# Patient Record
Sex: Female | Born: 1945 | Race: White | Hispanic: No | Marital: Married | Smoking: Former smoker
Health system: Southern US, Community
[De-identification: ages and names within clinical notes are randomized; demographics above are authoritative.]

## PROBLEM LIST (undated history)

## (undated) DIAGNOSIS — J189 Pneumonia, unspecified organism: Secondary | ICD-10-CM

## (undated) DIAGNOSIS — C801 Malignant (primary) neoplasm, unspecified: Secondary | ICD-10-CM

## (undated) DIAGNOSIS — N189 Chronic kidney disease, unspecified: Secondary | ICD-10-CM

## (undated) DIAGNOSIS — H269 Unspecified cataract: Secondary | ICD-10-CM

## (undated) DIAGNOSIS — I4891 Unspecified atrial fibrillation: Secondary | ICD-10-CM

## (undated) DIAGNOSIS — I10 Essential (primary) hypertension: Secondary | ICD-10-CM

## (undated) DIAGNOSIS — E119 Type 2 diabetes mellitus without complications: Secondary | ICD-10-CM

## (undated) DIAGNOSIS — M1990 Unspecified osteoarthritis, unspecified site: Secondary | ICD-10-CM

## (undated) DIAGNOSIS — E785 Hyperlipidemia, unspecified: Secondary | ICD-10-CM

## (undated) HISTORY — DX: Essential (primary) hypertension: I10

## (undated) HISTORY — DX: Unspecified cataract: H26.9

## (undated) HISTORY — PX: CATARACT EXTRACTION W/ INTRAOCULAR LENS IMPLANT: SHX1309

## (undated) HISTORY — PX: NEPHRECTOMY: SHX65

## (undated) HISTORY — DX: Hyperlipidemia, unspecified: E78.5

## (undated) HISTORY — PX: COLONOSCOPY: SHX174

## (undated) HISTORY — PX: OVARIAN CYST REMOVAL: SHX89

## (undated) HISTORY — DX: Unspecified osteoarthritis, unspecified site: M19.90

---

## 1998-03-25 ENCOUNTER — Other Ambulatory Visit: Admission: RE | Admit: 1998-03-25 | Discharge: 1998-03-25 | Payer: Self-pay | Admitting: Obstetrics and Gynecology

## 1998-08-06 ENCOUNTER — Other Ambulatory Visit: Admission: RE | Admit: 1998-08-06 | Discharge: 1998-08-06 | Payer: Self-pay | Admitting: Obstetrics and Gynecology

## 1999-04-01 ENCOUNTER — Other Ambulatory Visit: Admission: RE | Admit: 1999-04-01 | Discharge: 1999-04-01 | Payer: Self-pay | Admitting: Obstetrics and Gynecology

## 1999-07-01 ENCOUNTER — Encounter: Payer: Self-pay | Admitting: Unknown Physician Specialty

## 1999-07-01 ENCOUNTER — Encounter: Admission: RE | Admit: 1999-07-01 | Discharge: 1999-07-01 | Payer: Self-pay | Admitting: Unknown Physician Specialty

## 1999-09-16 ENCOUNTER — Other Ambulatory Visit: Admission: RE | Admit: 1999-09-16 | Discharge: 1999-09-16 | Payer: Self-pay | Admitting: Obstetrics and Gynecology

## 2000-04-02 ENCOUNTER — Other Ambulatory Visit: Admission: RE | Admit: 2000-04-02 | Discharge: 2000-04-02 | Payer: Self-pay | Admitting: Obstetrics and Gynecology

## 2000-04-23 ENCOUNTER — Encounter (INDEPENDENT_AMBULATORY_CARE_PROVIDER_SITE_OTHER): Payer: Self-pay

## 2000-04-23 ENCOUNTER — Other Ambulatory Visit: Admission: RE | Admit: 2000-04-23 | Discharge: 2000-04-23 | Payer: Self-pay | Admitting: Obstetrics and Gynecology

## 2000-07-01 ENCOUNTER — Encounter: Admission: RE | Admit: 2000-07-01 | Discharge: 2000-07-01 | Payer: Self-pay | Admitting: Obstetrics and Gynecology

## 2000-07-01 ENCOUNTER — Encounter: Payer: Self-pay | Admitting: Obstetrics and Gynecology

## 2001-04-12 ENCOUNTER — Other Ambulatory Visit: Admission: RE | Admit: 2001-04-12 | Discharge: 2001-04-12 | Payer: Self-pay | Admitting: Obstetrics and Gynecology

## 2001-07-19 ENCOUNTER — Encounter: Payer: Self-pay | Admitting: Obstetrics and Gynecology

## 2001-07-19 ENCOUNTER — Encounter: Admission: RE | Admit: 2001-07-19 | Discharge: 2001-07-19 | Payer: Self-pay | Admitting: Obstetrics and Gynecology

## 2002-07-21 ENCOUNTER — Encounter: Admission: RE | Admit: 2002-07-21 | Discharge: 2002-07-21 | Payer: Self-pay | Admitting: Obstetrics and Gynecology

## 2002-07-21 ENCOUNTER — Encounter: Payer: Self-pay | Admitting: Obstetrics and Gynecology

## 2003-07-04 ENCOUNTER — Encounter: Admission: RE | Admit: 2003-07-04 | Discharge: 2003-07-04 | Payer: Self-pay | Admitting: Internal Medicine

## 2003-07-25 ENCOUNTER — Encounter: Admission: RE | Admit: 2003-07-25 | Discharge: 2003-07-25 | Payer: Self-pay | Admitting: Obstetrics and Gynecology

## 2004-06-12 ENCOUNTER — Ambulatory Visit: Payer: Self-pay | Admitting: Internal Medicine

## 2004-06-17 ENCOUNTER — Ambulatory Visit: Payer: Self-pay | Admitting: Internal Medicine

## 2004-08-01 ENCOUNTER — Ambulatory Visit: Payer: Self-pay | Admitting: Internal Medicine

## 2004-08-12 ENCOUNTER — Encounter: Admission: RE | Admit: 2004-08-12 | Discharge: 2004-08-12 | Payer: Self-pay | Admitting: Obstetrics and Gynecology

## 2004-09-26 ENCOUNTER — Ambulatory Visit: Payer: Self-pay | Admitting: Internal Medicine

## 2004-12-09 ENCOUNTER — Ambulatory Visit: Payer: Self-pay | Admitting: Internal Medicine

## 2005-05-15 ENCOUNTER — Other Ambulatory Visit: Admission: RE | Admit: 2005-05-15 | Discharge: 2005-05-15 | Payer: Self-pay | Admitting: Obstetrics and Gynecology

## 2005-08-19 ENCOUNTER — Encounter: Admission: RE | Admit: 2005-08-19 | Discharge: 2005-08-19 | Payer: Self-pay | Admitting: Obstetrics and Gynecology

## 2006-02-10 ENCOUNTER — Ambulatory Visit: Payer: Self-pay | Admitting: Internal Medicine

## 2006-03-01 ENCOUNTER — Ambulatory Visit: Payer: Self-pay | Admitting: Internal Medicine

## 2006-03-18 ENCOUNTER — Ambulatory Visit: Payer: Self-pay | Admitting: Internal Medicine

## 2006-05-19 ENCOUNTER — Ambulatory Visit: Payer: Self-pay | Admitting: Internal Medicine

## 2006-05-19 LAB — CONVERTED CEMR LAB
ALT: 28 units/L (ref 0–40)
AST: 28 units/L (ref 0–37)
Cholesterol: 150 mg/dL (ref 0–200)
HDL: 49.3 mg/dL (ref >39.0)
LDL Cholesterol: 83 mg/dL (ref 0–99)
Triglycerides: 90 mg/dL (ref 0–149)
VLDL: 18 mg/dL (ref 0–40)

## 2006-08-23 ENCOUNTER — Encounter: Admission: RE | Admit: 2006-08-23 | Discharge: 2006-08-23 | Payer: Self-pay | Admitting: Obstetrics and Gynecology

## 2006-08-25 ENCOUNTER — Encounter: Admission: RE | Admit: 2006-08-25 | Discharge: 2006-08-25 | Payer: Self-pay | Admitting: Obstetrics and Gynecology

## 2006-11-09 ENCOUNTER — Ambulatory Visit: Payer: Self-pay | Admitting: Internal Medicine

## 2006-11-09 LAB — CONVERTED CEMR LAB: Cholesterol, target level: 200 mg/dL

## 2006-11-11 ENCOUNTER — Encounter (INDEPENDENT_AMBULATORY_CARE_PROVIDER_SITE_OTHER): Payer: Self-pay | Admitting: *Deleted

## 2006-11-23 ENCOUNTER — Encounter: Payer: Self-pay | Admitting: Internal Medicine

## 2006-12-13 ENCOUNTER — Encounter: Payer: Self-pay | Admitting: Internal Medicine

## 2007-03-02 ENCOUNTER — Ambulatory Visit: Payer: Self-pay | Admitting: Internal Medicine

## 2007-06-14 ENCOUNTER — Telehealth (INDEPENDENT_AMBULATORY_CARE_PROVIDER_SITE_OTHER): Payer: Self-pay | Admitting: *Deleted

## 2007-07-13 ENCOUNTER — Ambulatory Visit: Payer: Self-pay | Admitting: Internal Medicine

## 2007-07-13 LAB — CONVERTED CEMR LAB: ALT: 21 units/L (ref 0–35)

## 2007-07-21 ENCOUNTER — Ambulatory Visit: Payer: Self-pay | Admitting: Internal Medicine

## 2007-07-24 LAB — CONVERTED CEMR LAB
Cholesterol: 169 mg/dL (ref 0–200)
Hgb A1c MFr Bld: 6.4 % — ABNORMAL HIGH (ref 4.6–6.0)

## 2007-07-25 ENCOUNTER — Encounter (INDEPENDENT_AMBULATORY_CARE_PROVIDER_SITE_OTHER): Payer: Self-pay | Admitting: *Deleted

## 2007-07-27 ENCOUNTER — Telehealth (INDEPENDENT_AMBULATORY_CARE_PROVIDER_SITE_OTHER): Payer: Self-pay | Admitting: *Deleted

## 2007-08-02 ENCOUNTER — Ambulatory Visit: Payer: Self-pay | Admitting: Internal Medicine

## 2007-08-02 DIAGNOSIS — E785 Hyperlipidemia, unspecified: Secondary | ICD-10-CM | POA: Insufficient documentation

## 2007-08-02 DIAGNOSIS — D126 Benign neoplasm of colon, unspecified: Secondary | ICD-10-CM | POA: Insufficient documentation

## 2007-08-02 DIAGNOSIS — M8580 Other specified disorders of bone density and structure, unspecified site: Secondary | ICD-10-CM | POA: Insufficient documentation

## 2007-08-02 DIAGNOSIS — I10 Essential (primary) hypertension: Secondary | ICD-10-CM | POA: Insufficient documentation

## 2007-08-04 ENCOUNTER — Encounter (INDEPENDENT_AMBULATORY_CARE_PROVIDER_SITE_OTHER): Payer: Self-pay | Admitting: *Deleted

## 2007-08-25 ENCOUNTER — Encounter: Admission: RE | Admit: 2007-08-25 | Discharge: 2007-08-25 | Payer: Self-pay | Admitting: Obstetrics and Gynecology

## 2007-08-29 ENCOUNTER — Ambulatory Visit: Payer: Self-pay | Admitting: Gastroenterology

## 2007-11-01 ENCOUNTER — Ambulatory Visit: Payer: Self-pay | Admitting: Internal Medicine

## 2007-11-07 ENCOUNTER — Encounter (INDEPENDENT_AMBULATORY_CARE_PROVIDER_SITE_OTHER): Payer: Self-pay | Admitting: *Deleted

## 2007-11-07 LAB — CONVERTED CEMR LAB
BUN: 15 mg/dL (ref 6–23)
Creatinine,U: 34.2 mg/dL
Hgb A1c MFr Bld: 6 % (ref 4.6–6.0)
Microalb, Ur: 0.2 mg/dL (ref 0.0–1.9)
Potassium: 4.3 meq/L (ref 3.5–5.1)

## 2007-11-08 ENCOUNTER — Ambulatory Visit: Payer: Self-pay | Admitting: Internal Medicine

## 2008-02-03 ENCOUNTER — Ambulatory Visit: Payer: Self-pay | Admitting: Internal Medicine

## 2008-02-10 ENCOUNTER — Encounter (INDEPENDENT_AMBULATORY_CARE_PROVIDER_SITE_OTHER): Payer: Self-pay | Admitting: *Deleted

## 2008-02-10 LAB — CONVERTED CEMR LAB
Albumin: 4 g/dL (ref 3.5–5.2)
Alkaline Phosphatase: 53 units/L (ref 39–117)
Basophils Absolute: 0.1 10*3/uL (ref 0.0–0.1)
Bilirubin, Direct: 0.1 mg/dL (ref 0.0–0.3)
HCT: 37.9 % (ref 36.0–46.0)
Hemoglobin: 13 g/dL (ref 12.0–15.0)
Lymphocytes Relative: 22.6 % (ref 12.0–46.0)
MCV: 84.9 fL (ref 78.0–100.0)
Monocytes Absolute: 0.5 10*3/uL (ref 0.1–1.0)
Neutro Abs: 4.6 10*3/uL (ref 1.4–7.7)
Platelets: 247 10*3/uL (ref 150–400)
RDW: 15.1 % — ABNORMAL HIGH (ref 11.5–14.6)
TSH: 2.39 microintl units/mL (ref 0.35–5.50)
Total Bilirubin: 0.8 mg/dL (ref 0.3–1.2)
Total Protein: 7.7 g/dL (ref 6.0–8.3)
WBC: 6.9 10*3/uL (ref 4.5–10.5)

## 2008-04-30 ENCOUNTER — Ambulatory Visit: Payer: Self-pay | Admitting: Internal Medicine

## 2008-05-02 ENCOUNTER — Encounter (INDEPENDENT_AMBULATORY_CARE_PROVIDER_SITE_OTHER): Payer: Self-pay | Admitting: *Deleted

## 2008-05-02 LAB — CONVERTED CEMR LAB: Hgb A1c MFr Bld: 5.7 % (ref 4.6–6.0)

## 2008-05-14 ENCOUNTER — Telehealth (INDEPENDENT_AMBULATORY_CARE_PROVIDER_SITE_OTHER): Payer: Self-pay | Admitting: *Deleted

## 2008-08-27 ENCOUNTER — Encounter: Admission: RE | Admit: 2008-08-27 | Discharge: 2008-08-27 | Payer: Self-pay | Admitting: Obstetrics and Gynecology

## 2008-10-08 ENCOUNTER — Telehealth (INDEPENDENT_AMBULATORY_CARE_PROVIDER_SITE_OTHER): Payer: Self-pay | Admitting: *Deleted

## 2008-10-23 ENCOUNTER — Telehealth (INDEPENDENT_AMBULATORY_CARE_PROVIDER_SITE_OTHER): Payer: Self-pay | Admitting: *Deleted

## 2008-10-31 ENCOUNTER — Ambulatory Visit: Payer: Self-pay | Admitting: Internal Medicine

## 2008-10-31 LAB — CONVERTED CEMR LAB: Hgb A1c MFr Bld: 5.9 % (ref 4.6–6.5)

## 2008-11-01 ENCOUNTER — Encounter (INDEPENDENT_AMBULATORY_CARE_PROVIDER_SITE_OTHER): Payer: Self-pay | Admitting: *Deleted

## 2008-11-07 ENCOUNTER — Telehealth (INDEPENDENT_AMBULATORY_CARE_PROVIDER_SITE_OTHER): Payer: Self-pay | Admitting: *Deleted

## 2008-11-26 ENCOUNTER — Telehealth (INDEPENDENT_AMBULATORY_CARE_PROVIDER_SITE_OTHER): Payer: Self-pay | Admitting: *Deleted

## 2009-01-03 ENCOUNTER — Telehealth (INDEPENDENT_AMBULATORY_CARE_PROVIDER_SITE_OTHER): Payer: Self-pay | Admitting: *Deleted

## 2009-02-05 ENCOUNTER — Ambulatory Visit: Payer: Self-pay | Admitting: Internal Medicine

## 2009-02-05 LAB — CONVERTED CEMR LAB
ALT: 17 units/L (ref 0–35)
Albumin: 3.8 g/dL (ref 3.5–5.2)
Basophils Relative: 0.1 % (ref 0.0–3.0)
Cholesterol: 146 mg/dL (ref 0–200)
HCT: 38 % (ref 36.0–46.0)
HDL: 60.7 mg/dL (ref >39.00)
Hemoglobin: 13.1 g/dL (ref 12.0–15.0)
Hgb A1c MFr Bld: 5.4 % (ref 4.6–6.5)
LDL Cholesterol: 77 mg/dL (ref 0–99)
Lymphs Abs: 1.1 10*3/uL (ref 0.7–4.0)
MCV: 88.4 fL (ref 78.0–100.0)
Monocytes Absolute: 0.5 10*3/uL (ref 0.1–1.0)
Monocytes Relative: 7.8 % (ref 3.0–12.0)
Platelets: 205 10*3/uL (ref 150.0–400.0)
Total Bilirubin: 0.7 mg/dL (ref 0.3–1.2)
Total CHOL/HDL Ratio: 2
VLDL: 8 mg/dL (ref 0.0–40.0)

## 2009-02-08 ENCOUNTER — Ambulatory Visit: Payer: Self-pay | Admitting: Internal Medicine

## 2009-02-08 DIAGNOSIS — R739 Hyperglycemia, unspecified: Secondary | ICD-10-CM | POA: Insufficient documentation

## 2009-02-13 ENCOUNTER — Ambulatory Visit: Payer: Self-pay | Admitting: Internal Medicine

## 2009-09-04 ENCOUNTER — Encounter: Admission: RE | Admit: 2009-09-04 | Discharge: 2009-09-04 | Payer: Self-pay | Admitting: Obstetrics and Gynecology

## 2010-02-25 ENCOUNTER — Ambulatory Visit: Payer: Self-pay | Admitting: Internal Medicine

## 2010-02-25 ENCOUNTER — Telehealth (INDEPENDENT_AMBULATORY_CARE_PROVIDER_SITE_OTHER): Payer: Self-pay | Admitting: *Deleted

## 2010-03-03 ENCOUNTER — Encounter: Payer: Self-pay | Admitting: Internal Medicine

## 2010-03-03 ENCOUNTER — Ambulatory Visit: Payer: Self-pay | Admitting: Internal Medicine

## 2010-03-03 DIAGNOSIS — L259 Unspecified contact dermatitis, unspecified cause: Secondary | ICD-10-CM | POA: Insufficient documentation

## 2010-03-03 DIAGNOSIS — IMO0001 Reserved for inherently not codable concepts without codable children: Secondary | ICD-10-CM | POA: Insufficient documentation

## 2010-03-06 ENCOUNTER — Ambulatory Visit: Payer: Self-pay | Admitting: Internal Medicine

## 2010-03-10 LAB — CONVERTED CEMR LAB
ALT: 24 units/L (ref 0–35)
AST: 28 units/L (ref 0–37)
Alkaline Phosphatase: 58 units/L (ref 39–117)
BUN: 17 mg/dL (ref 6–23)
Basophils Absolute: 0.1 10*3/uL (ref 0.0–0.1)
Basophils Relative: 0.7 % (ref 0.0–3.0)
Calcium: 9.5 mg/dL (ref 8.4–10.5)
Creatinine, Ser: 1 mg/dL (ref 0.4–1.2)
HCT: 40.3 % (ref 36.0–46.0)
HDL: 64.6 mg/dL (ref >39.00)
Lymphs Abs: 1 10*3/uL (ref 0.7–4.0)
MCHC: 34.8 g/dL (ref 30.0–36.0)
MCV: 92.6 fL (ref 78.0–100.0)
Monocytes Relative: 6.9 % (ref 3.0–12.0)
Neutro Abs: 5.4 10*3/uL (ref 1.4–7.7)
Platelets: 217 10*3/uL (ref 150.0–400.0)
RBC: 4.36 M/uL (ref 3.87–5.11)
Sodium: 143 meq/L (ref 135–145)
Total Bilirubin: 0.5 mg/dL (ref 0.3–1.2)
Total CHOL/HDL Ratio: 3
Total CK: 132 units/L (ref 7–177)
Vit D, 25-Hydroxy: 60 ng/mL (ref 30–89)

## 2010-06-03 NOTE — Assessment & Plan Note (Signed)
Summary: FLU SHOT//PH  Nurse Visit  CC: Flu shot./kb   Allergies: 1)  ! Ibuprofen  Orders Added: 1)  Flu Vaccine 25yrs + MEDICARE PATIENTS [Q2039] 2)  Administration Flu vaccine - MCR [G0008]          Flu Vaccine Consent Questions     Do you have a history of severe allergic reactions to this vaccine? no    Any prior history of allergic reactions to egg and/or gelatin? no    Do you have a sensitivity to the preservative Thimersol? no    Do you have a past history of Guillan-Barre Syndrome? no    Do you currently have an acute febrile illness? no    Have you ever had a severe reaction to latex? no    Vaccine information given and explained to patient? yes    Are you currently pregnant? no    Lot Number:[id number]   Exp Date:11/01/2010   Site Given  Left Deltoid IMu

## 2010-06-03 NOTE — Assessment & Plan Note (Signed)
Summary: APPT FOR ED REFILL   Vital Signs:  Patient profile:   65 year old female Height:      65.25 inches Weight:      166.8 pounds BMI:     27.64 Temp:     97.9 degrees F oral Pulse rate:   60 / minute Resp:     14 per minute BP sitting:   120 / 78  (left arm) Cuff size:   large  Vitals Entered By: Shonna Chock CMA (March 03, 2010 2:48 PM) CC: Yearly follow-up, Lipid Management   CC:  Yearly follow-up and Lipid Management.  History of Present Illness: Here for Medicare AWV: 1.Risk factors based on Past M, S, F history: HTN; Dyslipidemia; Osteopenia ( chart updated) 2.Physical Activities: see Entry data 3.Depression/mood: denied 4.Hearing: decreased whisper @ 6 ft ; Audiology referral recommended 5.ADL's: no limitations 6.Fall Risk: none 7.Home Safety: no issues 8.Height, weight, &visual acuity:wall chart read @ 6 ft w/o lenses 9.Counseling: none requested 10.Labs ordered based on risk factors: see Orders 11. Referral Coordination: Audiology referral recommended 12.Care Plan: see Instructions 13. Cognitive Assessment: Oriented X 3; memory & recall intact   ; "WORLD" spelled backward; mood & affect normal. Hypertension Follow-Up      This is a 65 year old woman who presents for Hypertension follow-up.  The patient denies lightheadedness, urinary frequency, headaches, and fatigue.  The patient denies the following associated symptoms: chest pain, chest pressure, exercise intolerance, dyspnea, palpitations, syncope, leg edema, and pedal edema.  Compliance with medications (by patient report) has been near 100%.  The patient reports that dietary compliance has been good.  The patient reports exercising 3 X per week.  Adjunctive measures currently used by the patient include modified  salt restriction.  BP @ home in 120-130ss/70s. Hyperlipidemia Follow-Up      The patient also presents for Hyperlipidemia follow-up.  The patient reports muscle aches, but denies GI upset,  abdominal pain, flushing, itching, constipation, and diarrhea.  Compliance with medications (by patient report) has been near 100%.  Adjunctive measures currently used by the patient include ASA and folic acid( in a MVI).    Lipid Management History:      Positive NCEP/ATP III risk factors include female age 73 years old or older and hypertension.  Negative NCEP/ATP III risk factors include no history of early menopause without estrogen hormone replacement, non-diabetic, HDL cholesterol greater than 60, no family history for ischemic heart disease, non-tobacco-user status, no ASHD (atherosclerotic heart disease), no prior stroke/TIA, no peripheral vascular disease, and no history of aortic aneurysm.     Preventive Screening-Counseling & Management  Alcohol-Tobacco     Alcohol drinks/day: <1     Smoking Status: quit > 6 months     Year Started: 1962     Year Quit: 1980  Caffeine-Diet-Exercise     Does Patient Exercise: yes     Type of exercise: walk, yard work     Exercise (avg: min/session): 30-60     Times/week: <3  Hep-HIV-STD-Contraception     Sun Exposure-Excessive: no  Safety-Violence-Falls     Violence in the Home: no risk noted     Fall Risk: none      Sexual History:  currently monogamous.        Drug Use:  never.        Blood Transfusions:  no.        Travel History:  none recently.    Current Medications (verified): 1)  Simvastatin 40  Mg  Tabs (Simvastatin) .Marland Kitchen.. 1 By Mouth Once Daily **appointment Due** 2)  Hydrochlorothiazide 25 Mg  Tabs (Hydrochlorothiazide) .... 1/2 Tab Qd 3)  Lopressor 100 Mg  Tabs (Metoprolol Tartrate) .... Take One-Half Tablet Twice Daily**office Visit Due Now** 4)  Calcium and Vit D 2 Tabs Daily 5)  Multivitamin 6)  Asa 81mg  7)  Alendronate Sodium 70 Mg  Tabs (Alendronate Sodium) .Marland Kitchen.. 1 By Mouth Qweek**bone Density Due Now** 8)  Aspirin 325 Mg Tabs (Aspirin) .Marland Kitchen.. 1 By Mouth As Needed 9)  Tylenol Arthritis Pain 650 Mg Cr-Tabs  (Acetaminophen) .... As Needed  Allergies: 1)  ! Ibuprofen  Past History:  Past Medical History: HYPERLIPIDEMIA (ICD-272.4): [place] Study LDL goal = < 160. HYPERTENSION, ESSENTIAL NOS (ICD-401.9) post menopausal state colon polyp, HYPERPLASTIC  05/20/2001 Eczema elevated glucose(fasting hyperglycemia), PMH of  Osteopenia: T score -1.6 @ femoral neck  2010 Blood Donor  Past Surgical History: benign lesion R kidney, S/P nephrectomy (cyst) 1990; ovarian cyst 1990 colonoscopy polyps 05/2001; due 2013 as per Dr  Claudette Head gravid 0 para 0, Dr Henderson Cloud, Gyn  Family History: patient adopted unknown history(no access to family health  records)  Social History: Former Smoker: quit 1988 Alcohol use-yes socially Retired Regular exercise-yes: walk 3 X/week , yard work Married Smoking Status:  quit > 6 months Fall Risk:  none Sun Exposure-Excessive:  no Sexual History:  currently monogamous Drug Use:  never Blood Transfusions:  no  Review of Systems  The patient denies anorexia, hoarseness, prolonged cough, hemoptysis, melena, hematochezia, severe indigestion/heartburn, hematuria, incontinence, suspicious skin lesions, unusual weight change, abnormal bleeding, enlarged lymph nodes, and angioedema.         Weight up 5 # over oast year.  Physical Exam  General:  well-nourished;alert,appropriate and cooperative throughout examination Head:  Normocephalic and atraumatic without obvious abnormalities. Eyes:  No corneal or conjunctival inflammation noted.  Perrla. Light relexes ; cataract bilaterally  Ears:  External ear exam shows no significant lesions or deformities.  Otoscopic examination reveals clear canals, tympanic membranes are intact bilaterally without bulging, retraction, inflammation or discharge. Hearing is grossly  decreased  bilaterally. Nose:  External nasal examination shows no deformity or inflammation. Nasal mucosa are pink and moist without lesions or  exudates. Mouth:  Oral mucosa and oropharynx without lesions or exudates.  Teeth in good repair. Lungs:  Normal respiratory effort, chest expands symmetrically. Lungs are clear to auscultation, no crackles or wheezes. Heart:  regular rhythm, no gallop, no rub, no JVD, no HJR, bradycardia, and grade 1 /6 systolic murmur.   Abdomen:  Bowel sounds positive,abdomen soft and non-tender without masses, organomegaly or hernias noted. Genitalia:  Dr Henderson Cloud Msk:  No deformity or scoliosis noted of thoracic or lumbar spine.   Pulses:  R and L carotid,radial,dorsalis pedis and posterior tibial pulses are full and equal bilaterally Extremities:  No clubbing, cyanosis, edema, or deformity noted with normal full range of motion of all joints.   Neurologic:  alert & oriented X3 and DTRs symmetrical and normal.   Skin:  Intact without suspicious lesions ; eczema R wrist Cervical Nodes:  No lymphadenopathy noted Axillary Nodes:  No palpable lymphadenopathy Psych:  memory intact for recent and remote, normally interactive, and good eye contact.     Impression & Recommendations:  Problem # 1:  PREVENTIVE HEALTH CARE (ICD-V70.0)  Orders: Welcome to Medicare, Physical [phone])  Problem # 2:  HYPERTENSION, ESSENTIAL NOS (ICD-401.9)  Her updated medication list for this problem includes:  Hydrochlorothiazide 25 Mg Tabs (Hydrochlorothiazide) .Marland Kitchen... 1/2 tab qd    Lopressor 100 Mg Tabs (Metoprolol tartrate) .Marland Kitchen... Take one-half tablet twice daily**office visit due now**  Orders: EKG w/ Interpretation (93000)  Problem # 3:  HYPERLIPIDEMIA (ICD-272.4)  Her updated medication list for this problem includes:    Simvastatin 40 Mg Tabs (Simvastatin) .Marland Kitchen... 1 by mouth once daily **appointment due**  Problem # 4:  OSTEOPENIA (ICD-733.90)  Her updated medication list for this problem includes:    Alendronate Sodium 70 Mg Tabs (Alendronate sodium) .Marland Kitchen... 1 by mouth qweek**bone density due now**  Problem # 5:   MUSCLE PAIN (ICD-729.1) on Statin Her updated medication list for this problem includes:    Aspirin 325 Mg Tabs (Aspirin) .Marland Kitchen... 1 by mouth as needed    Tylenol Arthritis Pain 650 Mg Cr-tabs (Acetaminophen) .Marland Kitchen... As needed  Problem # 6:  ECZEMA (ICD-692.9) unresponsive to topical steroids by history; to see Dr Terri Piedra  Complete Medication List: 1)  Simvastatin 40 Mg Tabs (Simvastatin) .Marland Kitchen.. 1 by mouth once daily **appointment due** 2)  Hydrochlorothiazide 25 Mg Tabs (Hydrochlorothiazide) .... 1/2 tab qd 3)  Lopressor 100 Mg Tabs (Metoprolol tartrate) .... Take one-half tablet twice daily**office visit due now** 4)  Calcium and Vit D 2 Tabs Daily  5)  Multivitamin  6)  Asa 81mg   7)  Alendronate Sodium 70 Mg Tabs (Alendronate sodium) .Marland Kitchen.. 1 by mouth qweek**bone density due now** 8)  Aspirin 325 Mg Tabs (Aspirin) .Marland Kitchen.. 1 by mouth as needed 9)  Tylenol Arthritis Pain 650 Mg Cr-tabs (Acetaminophen) .... As needed  Other Orders: Tdap => 32yrs IM (78295) Admin 1st Vaccine (62130)  Lipid Assessment/Plan:      Based on NCEP/ATP III, the patient's risk factor category is "0-1 risk factors".  The patient's lipid goals are as follows: Total cholesterol goal is 200; LDL cholesterol goal is 130; HDL cholesterol goal is 40; Triglyceride goal is 150.  Her LDL cholesterol goal has been met.    Patient Instructions: 1)  Please schedule fasting labs; see Diagnoses for Codes: Vitamin D level; CPK;BMP ;Hepatic Panel ;Lipid Panel ;TSH ;CBC w/ Diff . Consider POA & Living Will .Please see Dr Terri Piedra for Eczema . 2)  Check your Blood Pressure regularly. If it is above: 135/85 ON AVERAGE  you should make an appointment. 3)  It is important that you exercise regularly at least 20 minutes 5 times a week. If you develop chest pain, have severe difficulty breathing, or feel very tired , stop exercising immediately and seek medical attention. 4)  Take an  81 mg coated Aspirin every day.   Orders Added: 1)  Tdap =>  18yrs IM [90715] 2)  Admin 1st Vaccine [90471] 3)  Est. Patient Level III [86578] 4)  Welcome to Medicare, Physical [G0402] 5)  EKG w/ Interpretation [93000]   Immunizations Administered:  Tetanus Vaccine:    Vaccine Type: Tdap    Site: right deltoid    Mfr: GlaxoSmithKline    Dose: 0.5 ml    Route: IM    Given by: Shonna Chock CMA    Exp. Date: 02/21/2012    Lot #: [id number]    VIS given: 03/21/08 version given March 03, 2010.   Immunizations Administered:  Tetanus Vaccine:    Vaccine Type: Tdap    Site: right deltoid    Mfr: GlaxoSmithKline    Dose: 0.5 ml    Route: IM    Given by: Shonna Chock CMA    Exp. Date: 02/21/2012  Lot #: [id number]    VIS given: 03/21/08 version given March 03, 2010.

## 2010-06-03 NOTE — Progress Notes (Signed)
Summary: refill  Phone Note Refill Request Message from:  Fax from Pharmacy on February 25, 2010 10:53 AM  Refills Requested: Medication #1:  SIMVASTATIN 40 MG  TABS 1 by mouth qd  Medication #2:  METOPROLOL TARTRATE 100 M TAKE 1/2 TABLET TWICE A DAY [place] - fax [phone]  Initial call taken by: Okey Regal [place],  February 25, 2010 10:53 AM    New/Updated Medications: SIMVASTATIN 40 MG  TABS (SIMVASTATIN) 1 by mouth once daily **APPOINTMENT DUE** LOPRESSOR 100 MG  TABS (METOPROLOL TARTRATE) Take one-half tablet twice daily**OFFICE VISIT DUE NOW** Prescriptions: LOPRESSOR 100 MG  TABS (METOPROLOL TARTRATE) Take one-half tablet twice daily**OFFICE VISIT DUE NOW**  #30 x 0   Entered by:   Shonna Chock CMA   Authorized by:   Marga Melnick MD   Signed by:   Shonna Chock CMA on 02/25/2010   Method used:   Electronically to        [facility]* (retail)       [address]       [place], [place]  [address]       Ph: [phone]       Fax: [phone]   RxID:   [id number] SIMVASTATIN 40 MG  TABS (SIMVASTATIN) 1 by mouth once daily **APPOINTMENT DUE**  #30 x 0   Entered by:   Shonna Chock CMA   Authorized by:   Marga Melnick MD   Signed by:   Shonna Chock CMA on 02/25/2010   Method used:   Electronically to        [facility]* (retail)       [address]       [place], [place]  [address]       Ph: [phone]       Fax: [phone]   RxID:   [id number]

## 2010-06-05 NOTE — Letter (Signed)
Summary: Patient Questionnaire  Patient Questionnaire   Imported By: Lanelle Bal 05/23/2010 10:06:25  _____________________________________________________________________  External Attachment:    Type:   Image     Comment:   External Document

## 2010-07-08 ENCOUNTER — Other Ambulatory Visit: Payer: Self-pay | Admitting: Obstetrics and Gynecology

## 2010-08-19 ENCOUNTER — Other Ambulatory Visit: Payer: Self-pay | Admitting: Obstetrics and Gynecology

## 2010-08-19 DIAGNOSIS — Z1231 Encounter for screening mammogram for malignant neoplasm of breast: Secondary | ICD-10-CM

## 2010-09-09 ENCOUNTER — Ambulatory Visit
Admission: RE | Admit: 2010-09-09 | Discharge: 2010-09-09 | Disposition: A | Discharge status: Home / Self Care | Payer: Medicare Other | Source: Ambulatory Visit | Attending: Obstetrics and Gynecology | Admitting: Obstetrics and Gynecology

## 2010-09-09 DIAGNOSIS — Z1231 Encounter for screening mammogram for malignant neoplasm of breast: Secondary | ICD-10-CM

## 2011-02-18 ENCOUNTER — Ambulatory Visit (INDEPENDENT_AMBULATORY_CARE_PROVIDER_SITE_OTHER): Payer: Medicare Other

## 2011-02-18 DIAGNOSIS — Z23 Encounter for immunization: Secondary | ICD-10-CM

## 2011-03-23 ENCOUNTER — Other Ambulatory Visit: Payer: Self-pay | Admitting: Internal Medicine

## 2011-03-23 MED ORDER — METOPROLOL TARTRATE 100 MG PO TABS: ORAL_TABLET | ORAL | Status: DC

## 2011-03-23 NOTE — Telephone Encounter (Signed)
RX sent, patient needs to schedule a CPX

## 2011-05-07 ENCOUNTER — Other Ambulatory Visit: Payer: Self-pay | Admitting: Internal Medicine

## 2011-05-07 NOTE — Telephone Encounter (Signed)
Please advise refill? 

## 2011-05-07 NOTE — Telephone Encounter (Signed)
She needs to be scheduled for fasting labs : BMET,Lipids, hepatic panel, CBC & dif, TSH. Codes: 272.4, 401.9, 995.20 . After lab and followup appointment made; refill times one

## 2011-05-08 NOTE — Telephone Encounter (Signed)
Called pt and set up lab appt for 05-11-11 at 9:15am pt is aware she needs to be fasting for this appt and that she will need appt before her rx can be filled, pt understood and set up office visit for 05-13-11 at 10am, pt accepted both appts

## 2011-05-08 NOTE — Telephone Encounter (Signed)
Spoke with [facility] nurse and we agreed note from [facility] agrees to send pt #30 day supply of medication after the office visits/lab visits were set up, appts noted in chart. rx sent to pharmacy by e-script' #30 no refills

## 2011-05-11 ENCOUNTER — Other Ambulatory Visit (INDEPENDENT_AMBULATORY_CARE_PROVIDER_SITE_OTHER): Payer: Medicare Other

## 2011-05-11 DIAGNOSIS — T887XXA Unspecified adverse effect of drug or medicament, initial encounter: Secondary | ICD-10-CM

## 2011-05-11 DIAGNOSIS — I10 Essential (primary) hypertension: Secondary | ICD-10-CM

## 2011-05-11 DIAGNOSIS — E785 Hyperlipidemia, unspecified: Secondary | ICD-10-CM

## 2011-05-11 LAB — CBC WITH DIFFERENTIAL/PLATELET
Basophils Absolute: 0.1 10*3/uL (ref 0.0–0.1)
Eosinophils Absolute: 0.2 10*3/uL (ref 0.0–0.7)
Eosinophils Relative: 2.4 % (ref 0.0–5.0)
Hemoglobin: 14.4 g/dL (ref 12.0–15.0)
Lymphocytes Relative: 19.4 % (ref 12.0–46.0)
Lymphs Abs: 1.4 10*3/uL (ref 0.7–4.0)
MCHC: 34.7 g/dL (ref 30.0–36.0)
Monocytes Absolute: 0.6 10*3/uL (ref 0.1–1.0)
Monocytes Relative: 8 % (ref 3.0–12.0)
Neutro Abs: 4.9 10*3/uL (ref 1.4–7.7)
Neutrophils Relative %: 69.2 % (ref 43.0–77.0)
RDW: 12.5 % (ref 11.5–14.6)
WBC: 7.1 10*3/uL (ref 4.5–10.5)

## 2011-05-11 LAB — BASIC METABOLIC PANEL
CO2: 30 mEq/L (ref 19–32)
Calcium: 9.5 mg/dL (ref 8.4–10.5)
Creatinine, Ser: 1 mg/dL (ref 0.4–1.2)
GFR: 57.48 mL/min — ABNORMAL LOW (ref >60.00)
Glucose, Bld: 118 mg/dL — ABNORMAL HIGH (ref 70–99)
Sodium: 142 mEq/L (ref 135–145)

## 2011-05-11 LAB — TSH: TSH: 1.75 u[IU]/mL (ref 0.35–5.50)

## 2011-05-11 LAB — LIPID PANEL
Cholesterol: 200 mg/dL (ref 0–200)
HDL: 57.7 mg/dL (ref >39.00)
LDL Cholesterol: 125 mg/dL — ABNORMAL HIGH (ref 0–99)
Total CHOL/HDL Ratio: 3
Triglycerides: 86 mg/dL (ref 0.0–149.0)

## 2011-05-12 LAB — HEPATIC FUNCTION PANEL
ALT: 30 U/L (ref 0–35)
AST: 31 U/L (ref 0–37)
Albumin: 4.1 g/dL (ref 3.5–5.2)
Bilirubin, Direct: 0 mg/dL (ref 0.0–0.3)
Total Protein: 7.3 g/dL (ref 6.0–8.3)

## 2011-05-13 ENCOUNTER — Encounter: Payer: Self-pay | Admitting: Internal Medicine

## 2011-05-13 ENCOUNTER — Ambulatory Visit (INDEPENDENT_AMBULATORY_CARE_PROVIDER_SITE_OTHER): Payer: Medicare Other | Admitting: Internal Medicine

## 2011-05-13 DIAGNOSIS — I10 Essential (primary) hypertension: Secondary | ICD-10-CM

## 2011-05-13 DIAGNOSIS — E785 Hyperlipidemia, unspecified: Secondary | ICD-10-CM

## 2011-05-13 DIAGNOSIS — L259 Unspecified contact dermatitis, unspecified cause: Secondary | ICD-10-CM

## 2011-05-13 DIAGNOSIS — R7309 Other abnormal glucose: Secondary | ICD-10-CM

## 2011-05-13 NOTE — Progress Notes (Signed)
  Subjective:    Patient ID: Sharon Knight, female    DOB: 10-13-1945, 66 y.o.   MRN: [id number]  HPI HYPERTENSION: Disease Monitoring: Blood pressure range-120s-130s/ 60s  Chest pain, palpitations- flutter earlier this week       Dyspnea- no Medications: Compliance- yes  Lightheadedness,Syncope- no    Edema- no  FASTING HYPERGLYCEMIA: Fasting blood sugar was 118 on 05/11/11 Polyuria/phagia/dipsia- no      Visual problems- no She has had increased carbohydrate intake since her last visit. Ophthalmology appointment pending in February  is M.D. HYPERLIPIDEMIA: Disease Monitoring: See symptoms for Hypertension Medications: Compliance- yes  Abd pain, bowel changes- no   Muscle aches- no           Review of Systems Exercise level has been decreased due to pain in the right knee. She denies any specific injury but the pain is worse with standing or squatting. She uses aspirin and soft support brace      Objective:   Physical Exam Gen.: Healthy and well-nourished in appearance. Alert, appropriate and cooperative throughout exam. Eyes: No corneal or conjunctival inflammation noted. Neck: No deformities, masses, or tenderness noted. Range of motion &  Thyroid normal. Lungs: Normal respiratory effort; chest expands symmetrically. Lungs are clear to auscultation without rales, wheezes, or increased work of breathing. Heart: Normal rate and rhythm. Normal S1 and S2. No gallop, click, or rub. Grade 1/6 systolic murmur . Abdomen: Bowel sounds normal; abdomen soft and nontender. No masses, organomegaly or hernias noted.                                                                                    Musculoskeletal/extremities:  No clubbing, cyanosis, edema, or deformity noted. Range of motion  normal .Tone & strength  normal. She has fusiform changes of the knees with crepitus, right greater than left. There is no definite effusion noted.  Nail health  good. Vascular: Carotid, radial  artery, dorsalis pedis and  posterior tibial pulses are full and equal. No bruits present. Neurologic: Alert and oriented x3. Deep tendon reflexes symmetrical and normal.          Skin: Eczema  is present at the right ventral wrist  Lymph: No cervical, axillary lymphadenopathy present. Psych: Mood and affect are normal. Normally interactive                                                                                         Assessment & Plan:  She does have a history of osteopenia; the knee issue relates to osteoarthritis/degenerative joint disease. She did decline this prescription pain medicine at this time. I encourage her to see the orthopedist if symptoms persist or progress

## 2011-05-13 NOTE — Assessment & Plan Note (Signed)
She has fasting hyperglycemia due to dietary indiscretion. Nutritional changes will be made with  followup labs in 4 months.

## 2011-05-13 NOTE — Assessment & Plan Note (Signed)
Blood pressure is well controlled based on home measurements. Chemistries are normal with exception of minimally reduced GFR of 57.48

## 2011-05-13 NOTE — Assessment & Plan Note (Signed)
Medication will not be changed; lipids will be rechecked after 4 months of nutritional change.

## 2011-05-13 NOTE — Assessment & Plan Note (Signed)
She believes that her husband has topical steroid at home which he can use as a trial.

## 2011-05-13 NOTE — Patient Instructions (Addendum)
Eat a low-fat diet with lots of fruits and vegetables, up to 7-9 servings per day. Consume less than 30 ( preferably zero) grams of sugar per day from foods & drinks with High Fructose Corn Syrup (HFCS) sugar as #1,2,3 or # 4 on label.Whole Foods, [employer] & [employer] do not carry products with HFCS. Follow a  low carb nutrition program such as [place] or The New Sugar [employer]usters  to prevent Diabetes progression . White carbohydrates (potatoes, rice, bread, and pasta) have a high spike of sugar and a high load of sugar. For example a  baked potato has a cup of sugar and a  french fry  2 teaspoons of sugar. Yams, wild  rice, whole grained bread &  wheat pasta have been much lower spike and load of  sugar. Portions should be the size of a deck of cards or your palm.  Please  schedule fasting Labs in 4 months : A1c, Lipids, vit D level. PLEASE BRING THESE INSTRUCTIONS TO FOLLOW UP  LAB APPOINTMENT.This will guarantee correct labs are drawn, eliminating need for repeat blood sampling ( needle sticks ! ). Diagnoses /Codes: 272.4, 790.29, 268.9.  BUN, creatinine, and GFR  all assess kidney function. To protect the kidneys it  is important to control your blood pressure and sugar. You should also stay well hydrated. Drink to thirst, up to 40 ounces of water a day.

## 2011-06-10 ENCOUNTER — Encounter: Payer: Self-pay | Admitting: Gastroenterology

## 2011-06-17 ENCOUNTER — Other Ambulatory Visit: Payer: Self-pay | Admitting: Internal Medicine

## 2011-07-07 ENCOUNTER — Other Ambulatory Visit: Payer: Self-pay | Admitting: Internal Medicine

## 2011-07-07 NOTE — Telephone Encounter (Signed)
Prescription sent to pharmacy.

## 2011-08-10 ENCOUNTER — Encounter: Payer: Self-pay | Admitting: Gastroenterology

## 2011-08-21 ENCOUNTER — Telehealth: Payer: Self-pay | Admitting: Internal Medicine

## 2011-08-21 DIAGNOSIS — E785 Hyperlipidemia, unspecified: Secondary | ICD-10-CM

## 2011-08-21 DIAGNOSIS — E559 Vitamin D deficiency, unspecified: Secondary | ICD-10-CM

## 2011-08-21 DIAGNOSIS — R7309 Other abnormal glucose: Secondary | ICD-10-CM

## 2011-08-21 NOTE — Telephone Encounter (Signed)
Pts husband called to schedule labs for the pt. According to last OV she needs A1c, Lipids, vit D level in early May. Pt would like to go to [facility]. Can you place the order and I'll call the pt to let her know.

## 2011-08-25 NOTE — Telephone Encounter (Signed)
Orders placed, patient can walk in at [facility], please close encounter once patient informed orders placed

## 2011-08-28 ENCOUNTER — Ambulatory Visit (AMBULATORY_SURGERY_CENTER): Payer: Medicare Other | Admitting: *Deleted

## 2011-08-28 ENCOUNTER — Encounter: Payer: Self-pay | Admitting: Gastroenterology

## 2011-08-28 VITALS — Ht 66.0 in | Wt 179.3 lb

## 2011-08-28 DIAGNOSIS — Z1211 Encounter for screening for malignant neoplasm of colon: Secondary | ICD-10-CM

## 2011-08-28 MED ORDER — PEG-KCL-NACL-NASULF-NA ASC-C 100 G PO SOLR: ORAL | Status: DC

## 2011-09-04 NOTE — Telephone Encounter (Signed)
Noted  

## 2011-09-04 NOTE — Telephone Encounter (Signed)
Pt states she will go have labs done after she has her colonoscopy on 09/11/11. She states she may run out of medication and I instructed her to call her pharmacy.

## 2011-09-11 ENCOUNTER — Ambulatory Visit (AMBULATORY_SURGERY_CENTER): Payer: Medicare Other | Admitting: Gastroenterology

## 2011-09-11 ENCOUNTER — Encounter: Payer: Self-pay | Admitting: Gastroenterology

## 2011-09-11 VITALS — BP 143/86 | HR 59 | Temp 98.7°F | Resp 19 | Ht 66.0 in | Wt 179.0 lb

## 2011-09-11 DIAGNOSIS — Z1211 Encounter for screening for malignant neoplasm of colon: Secondary | ICD-10-CM

## 2011-09-11 DIAGNOSIS — D126 Benign neoplasm of colon, unspecified: Secondary | ICD-10-CM

## 2011-09-11 MED ORDER — SODIUM CHLORIDE 0.9 % IV SOLN: 500.0000 mL | INTRAVENOUS | Status: DC

## 2011-09-11 NOTE — Progress Notes (Signed)
Patient did not experience any of the following events: a burn prior to discharge; a fall within the facility; wrong site/side/patient/procedure/implant event; or a hospital transfer or hospital admission upon discharge from the facility. (G8907) Patient did not have preoperative order for IV antibiotic SSI prophylaxis. (G8918) Patient did not have preoperative order for IV antibiotic SSI prophylaxis. (G8918)  

## 2011-09-11 NOTE — Op Note (Signed)
[facility] [address] [place], [place]  [address]  COLONOSCOPY PROCEDURE REPORT  PATIENT:  Sharon Knight, Sharon Knight  MR#:  [id number] BIRTHDATE:  [birth year], 66 yrs. old  GENDER:  female ENDOSCOPIST:  Judie Petit T. Russella Dar, MD, [facility]  PROCEDURE DATE:  09/11/2011 PROCEDURE:  Colonoscopy with snare polypectomy ASA CLASS:  Class II INDICATIONS:  1) Routine Risk Screening MEDICATIONS:   These medications were titrated to patient response per physician's verbal order, Fentanyl 50 mcg IV, Versed 8 mg IV DESCRIPTION OF PROCEDURE:   After the risks benefits and alternatives of the procedure were thoroughly explained, informed consent was obtained.  Digital rectal exam was performed and revealed no abnormalities.   The LB [id number] endoscope was introduced through the anus and advanced to the cecum, which was identified by both the appendix and ileocecal valve, without limitations.  The quality of the prep was excellent, using MoviPrep.  The instrument was then slowly withdrawn as the colon was fully examined. <<PROCEDUREIMAGES>> FINDINGS:  A sessile polyp was found in the sigmoid colon. It was 5 mm in size. Polyp was snared without cautery. Retrieval was successful.    Mild diverticulosis was found in the sigmoid colon. Otherwise normal colonoscopy without other polyps, masses, vascular ectasias, or inflammatory changes.  Retroflexed views in the rectum revealed no abnormalities.   The time to cecum =  3.5 minutes. The scope was then withdrawn (time =  11.5  min) from the patient and the procedure completed.  COMPLICATIONS:  None  ENDOSCOPIC IMPRESSION: 1) 5 mm sessile polyp in the sigmoid colon 2) Mild diverticulosis in the sigmoid colon  RECOMMENDATIONS: 1) High fiber diet with liberal fluid intake. 2) Await pathology results 3) If the polyp is adenomatous (pre-cancerous), repeat colonoscopy in 5 years. Otherwise follow colorectal cancer screening guidelines for "routine  risk" patients with colonoscopy in 10 years.  Venita Lick. Russella Dar, MD, [facility]  n. eSIGNED:   Venita Lick. Ramiyah Mcclenahan at 09/11/2011 10:56 AM  Dione Plover, [phone]

## 2011-09-11 NOTE — Patient Instructions (Signed)

## 2011-09-11 NOTE — Progress Notes (Signed)
Pressure applied to abdomen to reach cecum.  

## 2011-09-14 ENCOUNTER — Telehealth: Payer: Self-pay | Admitting: *Deleted

## 2011-09-14 NOTE — Telephone Encounter (Signed)
  Follow up Call-  Call back number 09/11/2011  Post procedure Call Back phone  # [phone]  Permission to leave phone message Yes     Patient questions:  Do you have a fever, pain , or abdominal swelling? no Pain Score  0 *  Have you tolerated food without any problems? yes  Have you been able to return to your normal activities? yes  Do you have any questions about your discharge instructions: Diet   no Medications  no Follow up visit  no  Do you have questions or concerns about your Care? no  Actions: * If pain score is 4 or above: No action needed, pain <4.

## 2011-09-15 ENCOUNTER — Encounter: Payer: Self-pay | Admitting: Gastroenterology

## 2011-09-17 ENCOUNTER — Other Ambulatory Visit: Payer: Self-pay | Admitting: Obstetrics and Gynecology

## 2011-09-17 ENCOUNTER — Other Ambulatory Visit (INDEPENDENT_AMBULATORY_CARE_PROVIDER_SITE_OTHER): Payer: Medicare Other

## 2011-09-17 DIAGNOSIS — Z1231 Encounter for screening mammogram for malignant neoplasm of breast: Secondary | ICD-10-CM

## 2011-09-17 DIAGNOSIS — E785 Hyperlipidemia, unspecified: Secondary | ICD-10-CM

## 2011-09-17 DIAGNOSIS — R7309 Other abnormal glucose: Secondary | ICD-10-CM

## 2011-09-17 DIAGNOSIS — E559 Vitamin D deficiency, unspecified: Secondary | ICD-10-CM

## 2011-09-17 LAB — LIPID PANEL
Cholesterol: 165 mg/dL (ref 0–200)
LDL Cholesterol: 91 mg/dL (ref 0–99)
Triglycerides: 69 mg/dL (ref 0.0–149.0)
VLDL: 13.8 mg/dL (ref 0.0–40.0)

## 2011-09-30 ENCOUNTER — Ambulatory Visit
Admission: RE | Admit: 2011-09-30 | Discharge: 2011-09-30 | Disposition: A | Discharge status: Home / Self Care | Payer: Medicare Other | Source: Ambulatory Visit | Attending: Obstetrics and Gynecology | Admitting: Obstetrics and Gynecology

## 2011-09-30 DIAGNOSIS — Z1231 Encounter for screening mammogram for malignant neoplasm of breast: Secondary | ICD-10-CM

## 2012-02-18 ENCOUNTER — Ambulatory Visit (INDEPENDENT_AMBULATORY_CARE_PROVIDER_SITE_OTHER): Payer: Medicare Other

## 2012-02-18 DIAGNOSIS — Z23 Encounter for immunization: Secondary | ICD-10-CM

## 2012-03-08 ENCOUNTER — Other Ambulatory Visit: Payer: Self-pay | Admitting: Internal Medicine

## 2012-03-14 ENCOUNTER — Other Ambulatory Visit: Payer: Self-pay | Admitting: Internal Medicine

## 2012-03-15 NOTE — Telephone Encounter (Signed)
Rx sent.    MW 

## 2012-06-07 ENCOUNTER — Other Ambulatory Visit: Payer: Self-pay | Admitting: Internal Medicine

## 2012-06-07 MED ORDER — HYDROCHLOROTHIAZIDE 25 MG PO TABS: ORAL_TABLET | ORAL | Status: DC

## 2012-06-07 NOTE — Telephone Encounter (Signed)
RX sent Side note: patient needs to schedule a CPX

## 2012-06-07 NOTE — Telephone Encounter (Signed)
refill Hydrochlorothiazide (Tab) 25 MG TAKE (1/2) TABLET DAILY. #45 last fill 11.5.13

## 2012-06-09 ENCOUNTER — Other Ambulatory Visit: Payer: Self-pay | Admitting: Internal Medicine

## 2012-06-19 ENCOUNTER — Other Ambulatory Visit: Payer: Self-pay | Admitting: Internal Medicine

## 2012-09-05 ENCOUNTER — Other Ambulatory Visit: Payer: Self-pay | Admitting: Internal Medicine

## 2012-09-05 NOTE — Telephone Encounter (Signed)
Called pt and scheduled her an appt on 10/31/12 per her request. Filled med until that appt.

## 2012-09-19 ENCOUNTER — Other Ambulatory Visit: Payer: Self-pay | Admitting: Internal Medicine

## 2012-09-20 NOTE — Telephone Encounter (Signed)
Pending appointment 10/31/12

## 2012-10-07 ENCOUNTER — Other Ambulatory Visit: Payer: Self-pay

## 2012-10-07 DIAGNOSIS — Z1231 Encounter for screening mammogram for malignant neoplasm of breast: Secondary | ICD-10-CM

## 2012-10-27 ENCOUNTER — Telehealth: Payer: Self-pay | Admitting: Internal Medicine

## 2012-10-27 NOTE — Telephone Encounter (Signed)
Patient Information:  Caller Name: Sharon Knight  Phone: [phone]  Patient: Sharon Knight, Sharon Knight  Gender: Female  DOB: July 12, 1945  Age: 67 Years  PCP: Marga Melnick  Office Follow Up:  Does the office need to follow up with this patient?: No  Instructions For The Office: N/A   Symptoms  Reason For Call & Symptoms: Asks if she can take her morning medications on 10/31/12 prior to her labs being drawn.  Per S. Rice, RN advised  morning medications may be taken with a small amount of water only.  Reviewed Health History In EMR: N/A  Reviewed Medications In EMR: N/A  Reviewed Allergies In EMR: N/A  Reviewed Surgeries / Procedures: N/A  Date of Onset of Symptoms: Unknown  Guideline(s) Used:  No Protocol Available - Information Only  Disposition Per Guideline:   Home Care  Reason For Disposition Reached:   Information only question and nurse able to answer  Advice Given:  Call Back If:  New symptoms develop  Patient Will Follow Care Advice:  YES

## 2012-10-31 ENCOUNTER — Other Ambulatory Visit: Payer: Self-pay | Admitting: Internal Medicine

## 2012-10-31 ENCOUNTER — Ambulatory Visit (INDEPENDENT_AMBULATORY_CARE_PROVIDER_SITE_OTHER): Payer: Medicare Other | Admitting: Internal Medicine

## 2012-10-31 ENCOUNTER — Encounter: Payer: Self-pay | Admitting: Internal Medicine

## 2012-10-31 VITALS — BP 124/78 | HR 54 | Temp 98.3°F | Ht 65.5 in | Wt 177.0 lb

## 2012-10-31 DIAGNOSIS — E559 Vitamin D deficiency, unspecified: Secondary | ICD-10-CM | POA: Insufficient documentation

## 2012-10-31 DIAGNOSIS — D126 Benign neoplasm of colon, unspecified: Secondary | ICD-10-CM

## 2012-10-31 DIAGNOSIS — E785 Hyperlipidemia, unspecified: Secondary | ICD-10-CM

## 2012-10-31 DIAGNOSIS — M899 Disorder of bone, unspecified: Secondary | ICD-10-CM

## 2012-10-31 DIAGNOSIS — Z0000 Encounter for general adult medical examination without abnormal findings: Secondary | ICD-10-CM

## 2012-10-31 DIAGNOSIS — Z905 Acquired absence of kidney: Secondary | ICD-10-CM

## 2012-10-31 DIAGNOSIS — M949 Disorder of cartilage, unspecified: Secondary | ICD-10-CM

## 2012-10-31 DIAGNOSIS — I10 Essential (primary) hypertension: Secondary | ICD-10-CM

## 2012-10-31 LAB — CBC WITH DIFFERENTIAL/PLATELET
Basophils Absolute: 0.1 10*3/uL (ref 0.0–0.1)
Basophils Relative: 1.2 % (ref 0.0–3.0)
Eosinophils Relative: 1.9 % (ref 0.0–5.0)
HCT: 42.3 % (ref 36.0–46.0)
Hemoglobin: 14.5 g/dL (ref 12.0–15.0)
Lymphs Abs: 1.4 10*3/uL (ref 0.7–4.0)
Monocytes Relative: 8 % (ref 3.0–12.0)
Neutro Abs: 3.8 10*3/uL (ref 1.4–7.7)
RBC: 4.58 Mil/uL (ref 3.87–5.11)
RDW: 12.8 % (ref 11.5–14.6)

## 2012-10-31 LAB — HEPATIC FUNCTION PANEL
ALT: 22 U/L (ref 0–35)
AST: 23 U/L (ref 0–37)
Albumin: 4.3 g/dL (ref 3.5–5.2)
Alkaline Phosphatase: 55 U/L (ref 39–117)
Total Protein: 7.6 g/dL (ref 6.0–8.3)

## 2012-10-31 LAB — TSH: TSH: 2.23 u[IU]/mL (ref 0.35–5.50)

## 2012-10-31 LAB — BASIC METABOLIC PANEL
GFR: 55.34 mL/min — ABNORMAL LOW (ref >60.00)
Glucose, Bld: 109 mg/dL — ABNORMAL HIGH (ref 70–99)
Potassium: 4.3 mEq/L (ref 3.5–5.1)
Sodium: 141 mEq/L (ref 135–145)

## 2012-10-31 MED ORDER — ZOSTER VACCINE LIVE 19400 UNT/0.65ML ~~LOC~~ SOLR: 0.6500 mL | Freq: Once | SUBCUTANEOUS | Status: DC

## 2012-10-31 NOTE — Addendum Note (Signed)
Addended by: Edwena Felty T on: 10/31/2012 10:31 AM   Modules accepted: Orders

## 2012-10-31 NOTE — Progress Notes (Signed)
Subjective:    Patient ID: Sharon Knight, female    DOB: 30-May-1945, 67 y.o.   MRN: [id number]  HPI Medicare Wellness Visit:  Psychosocial & medical history were reviewed as required by Medicare (abuse,antisocial behavioral risks,firearm risk).  Social history: caffeine: 1 coffee & 2 teas/day  , alcohol: 3 beers/ week  ,  tobacco use: quit 1986 Exercise :  See below Home & personal  safety / fall risk:no Limitations of activities of daily living:no Seatbelt  and smoke alarm use:yes Power of Attorney/Living Will status : needed Ophthalmology exam status :current Hearing evaluation status: not current Orientation :oriented X 3 Memory & recall :good Spelling  Testing: good Active depression / anxiety:denied Foreign travel history : > 40 years ago, [place] Immunization status for Shingles /Flu/ PNA/ tetanus :Shingles needed Transfusion history:  no Preventive health surveillance status of colonoscopy, BMD , mammograms,PAP as per protocol/ WUJ:[id number] pending 7/11 Dental care: every 6 mos  Chart reviewed &  Updated. Active issues reviewed & addressed.      Review of Systems She is usually on a heart healthy diet; she exercises as walking every other day > 30 minutes  without symptoms. Specifically she denies chest pain, palpitations, dyspnea, or claudication. Family history  for premature coronary disease is not known. Advanced cholesterol testing not on record. There is medication compliance with the statin. Significant abdominal symptoms, memory deficit, or myalgias denied. BP @ home not monitored. .     Objective:   Physical Exam  Gen.: Healthy and well-nourished in appearance. Alert, appropriate and cooperative throughout exam.Appears younger than stated age  Head: Normocephalic without obvious abnormalities  Eyes: No corneal or conjunctival inflammation noted. Pupils equal round reactive to light and accommodation.  Extraocular motion intact. Vision grossly normal with  lenses Ears: External  ear exam reveals no significant lesions or deformities. Canals clear .R TM scarred Hearing is grossly decreased bilaterally, L >R. Nose: External nasal exam reveals no deformity or inflammation. Nasal mucosa are pink and moist. No lesions or exudates noted.   Mouth: Oral mucosa and oropharynx reveal no lesions or exudates. Teeth in good repair. Neck: No deformities, masses, or tenderness noted. Range of motion & Thyroid normal. Lungs: Normal respiratory effort; chest expands symmetrically. Lungs are clear to auscultation without rales, wheezes, or increased work of breathing. Heart: Slow rate andregular rhythm. Normal S1 and S2. No gallop, click, or rub. Grade 1/2 over 6 systolic murmur LSB. Abdomen: Bowel sounds normal; abdomen soft and nontender. No masses, organomegaly or hernias noted. Genitalia: As per Dr Judith Part                                  Musculoskeletal/extremities: Accentuated curvature of  thoracic  Spine. No clubbing, cyanosis, edema, or significant extremity  deformity noted. Range of motion normal .Tone & strength  Normal. Joints normal . Nail health good. Able to lie down & sit up w/o help. Negative SLR bilaterally. Fusiform knees with some crepitus Vascular: Carotid, radial artery, dorsalis pedis and  posterior tibial pulses are full and equal. No bruits present. Neurologic: Alert and oriented x3. Deep tendon reflexes symmetrical and normal.  Skin: Intact without suspicious lesions or rashes. Lymph: No cervical, axillary lymphadenopathy present. Psych: Mood and affect are normal. Normally interactive  Assessment & Plan:  #1 Medicare Wellness Exam; criteria met ; data entered #2 Problem List/Diagnoses reviewed Plan:  Assessments made/ Orders entered  

## 2012-10-31 NOTE — Patient Instructions (Addendum)
Preventive Health Care: Exercise  30-45  minutes a day, 3-4 days a week. Walking is especially valuable in preventing Osteoporosis. Eat a low-fat diet with lots of fruits and vegetables, up to 7-9 servings per day. This would eliminate need for vitamin supplements for most individuals. Consume less than 30 grams of sugar per day from foods & drinks with High Fructose Corn Syrup as #2,3 or #4 on label. The legal document "Health Care Power of Attorney & Living Will " verifies decisions concerning your health care.  If you activate the  My Chart system; lab & Xray results will be released directly  to you as soon as I review & address these through the computer. In my absence ;my partners would address the results.Critical lab results will be communicated to you ASAP. If you choose not to sign up for My Chart within 36 hours of labs being drawn; results will be reviewed & interpretation added before being copied & mailed, causing a delay in getting the results to you.If you do not receive that report within 7-10 days ,please call. Additionally you can use this system to gain direct  access to your records  if  out of town or @ an office of a  physician who is not in  the My Chart network.  This improves continuity of care & places you in control of your medical record.

## 2012-11-01 LAB — NMR LIPOPROFILE WITH LIPIDS

## 2012-11-02 ENCOUNTER — Encounter: Payer: Self-pay | Admitting: *Deleted

## 2012-11-02 ENCOUNTER — Ambulatory Visit: Payer: Medicare Other

## 2012-11-02 DIAGNOSIS — R7309 Other abnormal glucose: Secondary | ICD-10-CM

## 2012-11-07 ENCOUNTER — Encounter: Payer: Self-pay | Admitting: *Deleted

## 2012-11-11 ENCOUNTER — Ambulatory Visit
Admission: RE | Admit: 2012-11-11 | Discharge: 2012-11-11 | Disposition: A | Discharge status: Home / Self Care | Payer: Medicare Other | Source: Ambulatory Visit

## 2012-11-11 DIAGNOSIS — Z1231 Encounter for screening mammogram for malignant neoplasm of breast: Secondary | ICD-10-CM

## 2012-11-13 ENCOUNTER — Encounter: Payer: Self-pay | Admitting: Internal Medicine

## 2012-11-13 ENCOUNTER — Other Ambulatory Visit: Payer: Self-pay | Admitting: Internal Medicine

## 2012-11-13 DIAGNOSIS — Z905 Acquired absence of kidney: Secondary | ICD-10-CM

## 2012-11-13 DIAGNOSIS — I10 Essential (primary) hypertension: Secondary | ICD-10-CM

## 2012-11-14 ENCOUNTER — Telehealth: Payer: Self-pay

## 2012-11-14 NOTE — Telephone Encounter (Signed)
Pt returned your call.  

## 2012-11-14 NOTE — Telephone Encounter (Signed)
Message copied by Maurice Small on Mon Nov 14, 2012  8:55 AM ------      Message from: Pecola Lawless      Created: Sun Nov 13, 2012 11:51 AM                   I have reviewed the bone density reports she provided and entered these into her electronic medical record. Also I reviewed her detailed letter.            I do not recommend calcium supplementation other than that in her diet. The GFR has decreased minimally. I recommend a renal ultrasound and a 24-hour urine for protein. Based on these results neurology follow up could be considered. ------

## 2012-11-14 NOTE — Telephone Encounter (Signed)
Left message on VM for patient to return call when available, reason for call: discuss Dr.Hopper's response to paperwork that patient provided

## 2012-11-15 ENCOUNTER — Ambulatory Visit
Admission: RE | Admit: 2012-11-15 | Discharge: 2012-11-15 | Disposition: A | Discharge status: Home / Self Care | Payer: Medicare Other | Source: Ambulatory Visit | Attending: Internal Medicine | Admitting: Internal Medicine

## 2012-11-15 ENCOUNTER — Other Ambulatory Visit: Payer: Self-pay | Admitting: Internal Medicine

## 2012-11-15 DIAGNOSIS — Z905 Acquired absence of kidney: Secondary | ICD-10-CM

## 2012-11-15 DIAGNOSIS — I10 Essential (primary) hypertension: Secondary | ICD-10-CM

## 2012-11-15 NOTE — Telephone Encounter (Signed)
Patient is calling back to f/u.

## 2012-11-15 NOTE — Telephone Encounter (Signed)
Discussed with patient, patient will pick-up 24 hour urine from [facility] and drop it off here ( This was ok'd by Clydie Braun at [facility] lab). Patient had Renal U/s done today.  [facility]opp please place order for 24 hour urine for GJ

## 2012-11-17 NOTE — Addendum Note (Signed)
Addended by: Silvio Pate D on: 11/17/2012 04:04 PM   Modules accepted: Orders

## 2012-11-18 LAB — PROTEIN, URINE, 24 HOUR: Protein, Urine: <3 mg/dL

## 2012-11-23 ENCOUNTER — Telehealth: Payer: Self-pay | Admitting: Internal Medicine

## 2012-11-23 NOTE — Telephone Encounter (Signed)
I checked Lipo science web site, no report on patient. I called [facility], no NMR was processed due to no tube received. I called patient, line busy. I will try to reach patient tomorrow

## 2012-11-23 NOTE — Telephone Encounter (Signed)
Please call NMR ; I don't have the report

## 2012-11-23 NOTE — Telephone Encounter (Signed)
[facility]opp please advise if you have seen NMR on this patient

## 2012-11-23 NOTE — Telephone Encounter (Signed)
Patient requests that her most recent cholesterol lab results be mailed to her. States that she received all her other results accept the cholesterol.

## 2012-11-24 NOTE — Telephone Encounter (Signed)
Tried to reach patient again, line busy . I will try to reach patient again later, reason for call if patient calls back first: Patient will have to come back in for cholesterol(NMR) to be checked (NO VENIPUNCTURE CHARGE), it appears this was left off last lab draw.

## 2012-11-25 NOTE — Telephone Encounter (Signed)
Tried to reach patient again today and line remains busy, I will mail letter to patient informing her that NMR (cholesterol profile) was not processed and at her convenience she can come in for a NO VENIPUNCTURE CHARGE fasting lab appointment.

## 2012-12-05 ENCOUNTER — Other Ambulatory Visit: Payer: Medicare Other

## 2012-12-05 DIAGNOSIS — E785 Hyperlipidemia, unspecified: Secondary | ICD-10-CM

## 2012-12-06 ENCOUNTER — Encounter: Payer: Self-pay | Admitting: Internal Medicine

## 2012-12-06 LAB — NMR LIPOPROFILE WITH LIPIDS
HDL Size: 9.5 nm (ref >=9.2)
HDL-C: 60 mg/dL (ref >=40)
LDL (calc): 92 mg/dL (ref <100)
LDL Size: 21.1 nm (ref >20.5)
LP-IR Score: 35 (ref <=45)
Large HDL-P: 8.2 umol/L (ref >=4.8)
Triglycerides: 109 mg/dL (ref <150)

## 2012-12-13 ENCOUNTER — Other Ambulatory Visit: Payer: Self-pay | Admitting: Internal Medicine

## 2012-12-22 ENCOUNTER — Other Ambulatory Visit: Payer: Self-pay | Admitting: Internal Medicine

## 2012-12-22 NOTE — Telephone Encounter (Signed)
Rx sent to the pharmacy by e-script.//AB/CMA 

## 2013-02-14 ENCOUNTER — Ambulatory Visit (INDEPENDENT_AMBULATORY_CARE_PROVIDER_SITE_OTHER): Payer: Medicare Other

## 2013-02-14 DIAGNOSIS — Z23 Encounter for immunization: Secondary | ICD-10-CM

## 2013-03-05 ENCOUNTER — Other Ambulatory Visit: Payer: Self-pay | Admitting: Internal Medicine

## 2013-03-06 NOTE — Telephone Encounter (Signed)
Hydrochlorothiazide refill sent to pharmacy 

## 2013-06-11 ENCOUNTER — Other Ambulatory Visit: Payer: Self-pay | Admitting: Internal Medicine

## 2013-06-13 NOTE — Telephone Encounter (Signed)
Rx sent to the pharmacy by e-script.  Pt needs office visit for BP follow-up.//AB/CMA

## 2013-08-29 ENCOUNTER — Other Ambulatory Visit: Payer: Self-pay | Admitting: Internal Medicine

## 2013-09-12 ENCOUNTER — Other Ambulatory Visit: Payer: Self-pay | Admitting: Internal Medicine

## 2013-10-26 ENCOUNTER — Other Ambulatory Visit: Payer: Self-pay | Admitting: Obstetrics and Gynecology

## 2013-10-27 LAB — CYTOLOGY - PAP

## 2013-11-20 ENCOUNTER — Other Ambulatory Visit: Payer: Self-pay

## 2013-11-20 DIAGNOSIS — Z1231 Encounter for screening mammogram for malignant neoplasm of breast: Secondary | ICD-10-CM

## 2013-11-29 ENCOUNTER — Ambulatory Visit
Admission: RE | Admit: 2013-11-29 | Discharge: 2013-11-29 | Disposition: A | Discharge status: Home / Self Care | Payer: Medicare Other | Source: Ambulatory Visit

## 2013-11-29 ENCOUNTER — Encounter (INDEPENDENT_AMBULATORY_CARE_PROVIDER_SITE_OTHER): Payer: Self-pay

## 2013-11-29 DIAGNOSIS — Z1231 Encounter for screening mammogram for malignant neoplasm of breast: Secondary | ICD-10-CM

## 2013-12-10 ENCOUNTER — Other Ambulatory Visit: Payer: Self-pay | Admitting: Internal Medicine

## 2013-12-21 ENCOUNTER — Ambulatory Visit (INDEPENDENT_AMBULATORY_CARE_PROVIDER_SITE_OTHER): Payer: Medicare Other | Admitting: Internal Medicine

## 2013-12-21 ENCOUNTER — Other Ambulatory Visit (INDEPENDENT_AMBULATORY_CARE_PROVIDER_SITE_OTHER): Payer: Medicare Other

## 2013-12-21 ENCOUNTER — Encounter: Payer: Self-pay | Admitting: Internal Medicine

## 2013-12-21 VITALS — BP 150/90 | HR 52 | Temp 98.4°F | Wt 185.8 lb

## 2013-12-21 DIAGNOSIS — E559 Vitamin D deficiency, unspecified: Secondary | ICD-10-CM

## 2013-12-21 DIAGNOSIS — R7309 Other abnormal glucose: Secondary | ICD-10-CM

## 2013-12-21 DIAGNOSIS — E785 Hyperlipidemia, unspecified: Secondary | ICD-10-CM

## 2013-12-21 DIAGNOSIS — I10 Essential (primary) hypertension: Secondary | ICD-10-CM

## 2013-12-21 LAB — HEPATIC FUNCTION PANEL
ALK PHOS: 59 U/L (ref 39–117)
ALT: 32 U/L (ref 0–35)
AST: 29 U/L (ref 0–37)
Albumin: 4.2 g/dL (ref 3.5–5.2)
BILIRUBIN DIRECT: 0.1 mg/dL (ref 0.0–0.3)
BILIRUBIN TOTAL: 0.7 mg/dL (ref 0.2–1.2)
Total Protein: 7.4 g/dL (ref 6.0–8.3)

## 2013-12-21 LAB — BASIC METABOLIC PANEL
BUN: 17 mg/dL (ref 6–23)
CO2: 30 meq/L (ref 19–32)
CREATININE: 1 mg/dL (ref 0.4–1.2)
Calcium: 9.7 mg/dL (ref 8.4–10.5)
Chloride: 105 mEq/L (ref 96–112)
GFR: 61.91 mL/min (ref >60.00)
Glucose, Bld: 109 mg/dL — ABNORMAL HIGH (ref 70–99)
Potassium: 4.6 mEq/L (ref 3.5–5.1)
SODIUM: 142 meq/L (ref 135–145)

## 2013-12-21 LAB — LIPID PANEL
CHOL/HDL RATIO: 4
Cholesterol: 221 mg/dL — ABNORMAL HIGH (ref 0–200)
HDL: 61.1 mg/dL (ref >39.00)
LDL CALC: 134 mg/dL — AB (ref 0–99)
NONHDL: 159.9
Triglycerides: 129 mg/dL (ref 0.0–149.0)
VLDL: 25.8 mg/dL (ref 0.0–40.0)

## 2013-12-21 LAB — HEMOGLOBIN A1C: Hgb A1c MFr Bld: 6.1 % (ref 4.6–6.5)

## 2013-12-21 LAB — TSH: TSH: 2.43 u[IU]/mL (ref 0.35–4.50)

## 2013-12-21 LAB — VITAMIN D 25 HYDROXY (VIT D DEFICIENCY, FRACTURES): VITD: 57.19 ng/mL (ref 30.00–100.00)

## 2013-12-21 NOTE — Assessment & Plan Note (Signed)
Vitamin D level 

## 2013-12-21 NOTE — Assessment & Plan Note (Signed)
Blood pressure goals reviewed. BMET 

## 2013-12-21 NOTE — Assessment & Plan Note (Addendum)
Lipids, LFTs, TSH   Note : on Simvastatin 40 mg 1/2 qd

## 2013-12-21 NOTE — Assessment & Plan Note (Signed)
A1c

## 2013-12-21 NOTE — Progress Notes (Signed)
   Subjective:    Patient ID: Sharon Knight, female    DOB: 1946/02/09, 68 y.o.   MRN: [id number]  HPI   She is on a modified heart healthy diet; she does not restrict salt  She has not been exercising due to various health issues.  She has no adverse effects from her medications but she decreased her simvastatin 40 mg to half a pill daily 6 weeks ago as she was running low.  She does not monitor blood pressure regular basis but has found it to be 140+/70-80s on average.  She has had occasional palpitations after being active in the summer heat.  She also describes some constipation. Her colonoscopy is up-to-date.  Her health issues this year have been recurrent rash over several months. She found this was related to a detergent. It did improve with topical agents  She's had recurrent left knee pain which limits her exercise.  She's also had low back pain. She is taking Aleve for the musculoskeletal complaints.  She has had recurrent ovarian cysts which are being monitored by ultrasound every 6 months by her gynecologist.    Intermittent BPV symptoms.   Review of Systems  Chest pain, tachycardia, exertional dyspnea, paroxysmal nocturnal dyspnea, claudication or edema are absent.  Unexplained weight loss, abdominal pain, significant dyspepsia, dysphagia, melena, rectal bleeding, or persistently small caliber stools are denied.     Objective:   Physical Exam   Positive or pertinent physical findings include: Slight resting exotropia of the right eye. She has marked hearing loss bilaterally. Grade 1 systolic murmur at the base. There are fusiform changes of the knees with marked crepitus, left greater than right. She has a decreased left knee deep tendon reflex.  General appearance :adequately nourished; in no distress. Eyes: No conjunctival inflammation or scleral icterus is present. Oral exam: Dental hygiene is good. Lips and gums are healthy appearing.There is no  oropharyngeal erythema or exudate noted.  Heart:  Normal rate and regular rhythm. S1 and S2 normal without gallop, murmur, click, rub or other extra sounds   Lungs:Chest clear to auscultation; no wheezes, rhonchi,rales ,or rubs present.No increased work of breathing.  Abdomen: bowel sounds normal, soft and non-tender without masses, organomegaly or hernias noted.  No guarding or rebound. No flank tenderness to percussion. Skin:Warm & dry.  Intact without suspicious lesions or rashes ; no jaundice or tenting Lymphatic: No lymphadenopathy is noted about the head, neck, axilla          Assessment & Plan:  See Current Assessment & Plan in Problem List under specific Diagnosis

## 2013-12-21 NOTE — Progress Notes (Signed)
Pre visit review using our clinic review tool, if applicable. No additional management support is needed unless otherwise documented below in the visit note. 

## 2013-12-21 NOTE — Patient Instructions (Addendum)
Use an anti-inflammatory cream such as Aspercreme or Zostrix cream twice a day to the affected area as needed. In lieu of this warm moist compresses or  hot water bottle can be used. Do not apply ice .  Minimal Blood Pressure Goal= AVERAGE < 140/90;  Ideal is an AVERAGE < 135/85. This AVERAGE should be calculated from @ least 5-7 BP readings taken @ different times of day on different days of week. You should not respond to isolated BP readings , but rather the AVERAGE for that week .Please bring your  blood pressure cuff to office visits to verify that it is reliable.It  can also be checked against the blood pressure device at the pharmacy. Finger or wrist cuffs are not dependable; an arm cuff is.  Go to Web M[employer] for information on benign positional vertigo (BPV) . Physical therapy exercises can treat that.

## 2014-01-09 ENCOUNTER — Other Ambulatory Visit: Payer: Self-pay | Admitting: Internal Medicine

## 2014-02-09 ENCOUNTER — Other Ambulatory Visit: Payer: Self-pay | Admitting: Internal Medicine

## 2014-02-20 ENCOUNTER — Ambulatory Visit (INDEPENDENT_AMBULATORY_CARE_PROVIDER_SITE_OTHER): Payer: Medicare Other

## 2014-02-20 DIAGNOSIS — Z23 Encounter for immunization: Secondary | ICD-10-CM

## 2014-05-28 ENCOUNTER — Other Ambulatory Visit: Payer: Self-pay | Admitting: Internal Medicine

## 2014-08-06 ENCOUNTER — Other Ambulatory Visit: Payer: Self-pay | Admitting: Internal Medicine

## 2014-10-22 ENCOUNTER — Encounter: Payer: Self-pay | Admitting: Internal Medicine

## 2014-10-22 ENCOUNTER — Other Ambulatory Visit (INDEPENDENT_AMBULATORY_CARE_PROVIDER_SITE_OTHER): Payer: Medicare Other

## 2014-10-22 ENCOUNTER — Ambulatory Visit (INDEPENDENT_AMBULATORY_CARE_PROVIDER_SITE_OTHER): Payer: Medicare Other | Admitting: Internal Medicine

## 2014-10-22 VITALS — BP 138/68 | HR 55 | Temp 97.7°F | Resp 14 | Ht 65.0 in | Wt 182.0 lb

## 2014-10-22 DIAGNOSIS — I10 Essential (primary) hypertension: Secondary | ICD-10-CM | POA: Diagnosis not present

## 2014-10-22 DIAGNOSIS — R002 Palpitations: Secondary | ICD-10-CM

## 2014-10-22 DIAGNOSIS — R739 Hyperglycemia, unspecified: Secondary | ICD-10-CM | POA: Diagnosis not present

## 2014-10-22 DIAGNOSIS — Z86010 Personal history of colonic polyps: Secondary | ICD-10-CM | POA: Diagnosis not present

## 2014-10-22 DIAGNOSIS — M8580 Other specified disorders of bone density and structure, unspecified site: Secondary | ICD-10-CM | POA: Diagnosis not present

## 2014-10-22 DIAGNOSIS — E785 Hyperlipidemia, unspecified: Secondary | ICD-10-CM

## 2014-10-22 DIAGNOSIS — E559 Vitamin D deficiency, unspecified: Secondary | ICD-10-CM

## 2014-10-22 LAB — CBC WITH DIFFERENTIAL/PLATELET
BASOS ABS: 0.1 10*3/uL (ref 0.0–0.1)
BASOS PCT: 0.9 % (ref 0.0–3.0)
EOS ABS: 0.1 10*3/uL (ref 0.0–0.7)
Eosinophils Relative: 1.8 % (ref 0.0–5.0)
HCT: 43.7 % (ref 36.0–46.0)
Hemoglobin: 14.9 g/dL (ref 12.0–15.0)
LYMPHS PCT: 17.7 % (ref 12.0–46.0)
Lymphs Abs: 1.4 10*3/uL (ref 0.7–4.0)
MCHC: 34.1 g/dL (ref 30.0–36.0)
MCV: 91.2 fl (ref 78.0–100.0)
MONO ABS: 0.6 10*3/uL (ref 0.1–1.0)
Monocytes Relative: 7.6 % (ref 3.0–12.0)
Neutro Abs: 5.7 10*3/uL (ref 1.4–7.7)
Neutrophils Relative %: 72 % (ref 43.0–77.0)
PLATELETS: 295 10*3/uL (ref 150.0–400.0)
RBC: 4.79 Mil/uL (ref 3.87–5.11)
RDW: 12.8 % (ref 11.5–15.5)
WBC: 7.9 10*3/uL (ref 4.0–10.5)

## 2014-10-22 LAB — HEMOGLOBIN A1C: Hgb A1c MFr Bld: 5.9 % (ref 4.6–6.5)

## 2014-10-22 LAB — BASIC METABOLIC PANEL
BUN: 14 mg/dL (ref 6–23)
CO2: 30 meq/L (ref 19–32)
Calcium: 9.8 mg/dL (ref 8.4–10.5)
Chloride: 104 mEq/L (ref 96–112)
Creatinine, Ser: 1.08 mg/dL (ref 0.40–1.20)
GFR: 53.26 mL/min — ABNORMAL LOW (ref >60.00)
GLUCOSE: 116 mg/dL — AB (ref 70–99)
POTASSIUM: 4 meq/L (ref 3.5–5.1)
SODIUM: 139 meq/L (ref 135–145)

## 2014-10-22 LAB — HEPATIC FUNCTION PANEL
ALT: 25 U/L (ref 0–35)
AST: 23 U/L (ref 0–37)
Albumin: 4.2 g/dL (ref 3.5–5.2)
Alkaline Phosphatase: 56 U/L (ref 39–117)
Bilirubin, Direct: 0.1 mg/dL (ref 0.0–0.3)
Total Bilirubin: 0.7 mg/dL (ref 0.2–1.2)
Total Protein: 7.2 g/dL (ref 6.0–8.3)

## 2014-10-22 LAB — LIPID PANEL
CHOLESTEROL: 171 mg/dL (ref 0–200)
HDL: 56.6 mg/dL (ref >39.00)
LDL Cholesterol: 94 mg/dL (ref 0–99)
NonHDL: 114.4
TRIGLYCERIDES: 104 mg/dL (ref 0.0–149.0)
Total CHOL/HDL Ratio: 3
VLDL: 20.8 mg/dL (ref 0.0–40.0)

## 2014-10-22 LAB — VITAMIN D 25 HYDROXY (VIT D DEFICIENCY, FRACTURES): VITD: 53.46 ng/mL (ref 30.00–100.00)

## 2014-10-22 LAB — TSH: TSH: 1.66 u[IU]/mL (ref 0.35–4.50)

## 2014-10-22 NOTE — Assessment & Plan Note (Signed)
A1c

## 2014-10-22 NOTE — Assessment & Plan Note (Signed)
Lipids, LFTs, TSH  

## 2014-10-22 NOTE — Assessment & Plan Note (Signed)
Vit D level.

## 2014-10-22 NOTE — Progress Notes (Signed)
Pre visit review using our clinic review tool, if applicable. No additional management support is needed unless otherwise documented below in the visit note. 

## 2014-10-22 NOTE — Assessment & Plan Note (Signed)
Blood pressure goals reviewed. BMET 

## 2014-10-22 NOTE — Patient Instructions (Signed)
Minimal Blood Pressure Goal= AVERAGE < 140/90;  Ideal is an AVERAGE < 135/85. This AVERAGE should be calculated from @ least 5-7 BP readings taken @ different times of day on different days of week. You should not respond to isolated BP readings , but rather the AVERAGE for that week .Please bring your  blood pressure cuff to office visits to verify that it is reliable.It  can also be checked against the blood pressure device at the pharmacy. Finger or wrist cuffs are not dependable; an arm cuff is.  Your next office appointment will be determined based upon review of your pending labs. Those written interpretation of the lab results and instructions will be transmitted to you by mail for your records.  Critical results will be called.   Followup as needed for any active or acute issue. Please report any significant change in your symptoms.  Colonoscopy due 2023.  To prevent palpitations or premature beats, avoid stimulants such as decongestants, diet pills, nicotine, or caffeine (coffee, tea, cola, or chocolate) to excess.

## 2014-10-22 NOTE — Assessment & Plan Note (Signed)
BMD every 25 months

## 2014-10-22 NOTE — Progress Notes (Signed)
   Subjective:    Patient ID: Sharon Knight, female    DOB: 28-Jan-1946, 69 y.o.   MRN: [id number]  HPI  The patient is here to assess status of active health conditions.  PMH, FH, & Social History reviewed & updated.   She has been compliant with her medications without adverse effects. She does not restrict salt. 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  She does describe palpitations when physically active; this is described as "fluttering". She has no other cardiopulmonary symptoms.  Blood pressure is not monitored regularly. It tends to be 130-140/68 on average.  Her gynecologic care is up-to-date; her gynecologist is monitoring her ovarian cyst.  Colonoscopy is up-to-date and not due until 2023. She has no active GI symptoms.     Review of Systems   The back pain is not associated with numbness, tingling,or weakness in the extremities. She has no loss of control of her bladder or bowels.  She does have intermittent benign positional vertigo. Chest pain, tachycardia, exertional dyspnea, paroxysmal nocturnal dyspnea, claudication or edema are absent. No unexplained weight loss, abdominal pain, significant dyspepsia, dysphagia, melena, rectal bleeding, or persistently small caliber stools. Dysuria, pyuria, hematuria, frequency, nocturia or polyuria are denied. Change in hair, skin, nails denied. No bowel changes of constipation or diarrhea. No intolerance to heat or cold.     Objective:   Physical Exam Pertinent or positive findings include: Eyebrows are absent. She has decreased hearing bilaterally. There is a grade 8/3-7 raspy systolic murmur at the left base. Fusiform changes are present of the knees with crepitus. The left knee reflex is 1+; right is 1.5+. Gait is short and choppy.  General appearance :adequately nourished; in no distress.  Eyes: No conjunctival inflammation or scleral icterus is present.  Oral exam:  Lips and gums are healthy  appearing.There is no oropharyngeal erythema or exudate noted. Dental hygiene is good.  Heart:  Normal rate and regular rhythm. S1 and S2 normal without gallop, click, rub or other extra sounds    Lungs:Chest clear to auscultation; no wheezes, rhonchi,rales ,or rubs present.No increased work of breathing.   Abdomen: bowel sounds normal, soft and non-tender without masses, organomegaly or hernias noted.  No guarding or rebound.   Vascular : all pulses equal ; no bruits present.  Skin:Warm & dry.  Intact without suspicious lesions or rashes ; no tenting or jaundice   Lymphatic: No lymphadenopathy is noted about the head, neck, axilla.   Neuro: Strength, tone  normal.        Assessment & Plan:  #1 See Current Assessment & Plan in Problem List under specific Diagnosis #2 palpitations. EKG WNL.

## 2014-10-22 NOTE — Assessment & Plan Note (Signed)
CBC

## 2014-10-30 ENCOUNTER — Other Ambulatory Visit: Payer: Self-pay

## 2014-10-30 DIAGNOSIS — Z1231 Encounter for screening mammogram for malignant neoplasm of breast: Secondary | ICD-10-CM

## 2014-11-24 ENCOUNTER — Other Ambulatory Visit: Payer: Self-pay | Admitting: Internal Medicine

## 2014-11-26 ENCOUNTER — Other Ambulatory Visit: Payer: Self-pay

## 2014-11-26 MED ORDER — HYDROCHLOROTHIAZIDE 25 MG PO TABS: ORAL_TABLET | ORAL | Status: DC

## 2014-12-03 ENCOUNTER — Ambulatory Visit
Admission: RE | Admit: 2014-12-03 | Discharge: 2014-12-03 | Disposition: A | Discharge status: Home / Self Care | Payer: Medicare Other | Source: Ambulatory Visit

## 2014-12-03 DIAGNOSIS — Z1231 Encounter for screening mammogram for malignant neoplasm of breast: Secondary | ICD-10-CM

## 2015-02-03 ENCOUNTER — Other Ambulatory Visit: Payer: Self-pay | Admitting: Internal Medicine

## 2015-02-04 ENCOUNTER — Other Ambulatory Visit: Payer: Self-pay | Admitting: Emergency Medicine

## 2015-02-04 MED ORDER — METOPROLOL TARTRATE 100 MG PO TABS: ORAL_TABLET | ORAL | Status: DC

## 2015-02-04 MED ORDER — SIMVASTATIN 40 MG PO TABS: ORAL_TABLET | ORAL | Status: DC

## 2015-02-11 ENCOUNTER — Ambulatory Visit (INDEPENDENT_AMBULATORY_CARE_PROVIDER_SITE_OTHER): Payer: Medicare Other

## 2015-02-11 VITALS — BP 146/80 | Ht 66.0 in | Wt 186.0 lb

## 2015-02-11 DIAGNOSIS — Z0000 Encounter for general adult medical examination without abnormal findings: Secondary | ICD-10-CM

## 2015-02-11 DIAGNOSIS — Z23 Encounter for immunization: Secondary | ICD-10-CM

## 2015-02-11 NOTE — Progress Notes (Addendum)
Subjective:   Sharon Knight is a 68 y.o. female who presents for Medicare Annual (Subsequent) preventive examination.  Review of Systems:   Cardiac Risk Factors include: advanced age (>48men, >75 women);dyslipidemia;hypertension;sedentary lifestyle   The Patient was informed that this wellness visit is to identify risk and educate on how to reduce risk for increase disease through lifestyle changes.   ROS deferred to CPE exam with physician planned for 10/11  Medical issues reviewed, including labs for chol; A1c and GFR  BMI: 30  Diet; spouse can't eat "good food" He eats peanut butter;  spouse does not use his denture; cooking is a challenge; Tend to eat out;  States that she would cook but when spouse can't eat, then she has to cook him something;  Educated on pre diabetes; walking and monitoring weight  Discussed walking 27min tiw; Also discussed watching sugar intake but overall to review for the amount of carbs;  discussed balance diet and high sugar level increases risk for CVD as she would like to decrease statin;  educated on labs and risk without statin/ chol ratio wnl at present  Recommended MNT for family; referral made for spouse and hopefully will assist in managing better diet for both.  Difficult to eat well when eating out Exercise is a challenge; used to exercise in the am and walks, but not watching her mother in law and not able to exercise; Agreed to attempt walking again;   Stressors;  Mother In law started failing this summer; she is 35; still lives alone in apt; Still cooks; needs a lot of monitoring for med safety and overall health by patient.  Spouse and other sons can't assist; would like to find a nice place for her to be but not financially able; Is getting confused regarding medicine;  Recommended Community health response program and discuss DSS for assistance if LTC is needed;   The pateint De-stresses by reading;   Has one kidney; ovarian cyst x 2 a  year; reviewed GFR and need to eat well, exercise and care for self  SAFETY Safety reviewed for the home; including removal of clutter; clear paths through the home has been completed, railing as needed; bathroom safety reviewed; tub bath;  community safety good; smoke detectors and firearms safety as well as sun protection;  Driving accidents and seatbelt; good   Medication review   Fall assessment; no/ doesn't feel as strong now that she is not walking  Mobilization and Functional losses in the last year. no Sleep patterns; sometimes misses sleep but sleeps well most nights   Urinary or fecal incontinence reviewed / no  Hep c screen discussed Dexa bone scan; years ago/ takes Vit d now/Jan 2012 (femur -1.5 and -1.6)  Educated on dexa and will discuss with Dr. [name] Mammogram 12/2014 normal PSV 13 - will take in 2 weeks;  Flu shot- given today Eye exam; x 2 years ago but will set up to have this soon.    Counseling:     Objective:     Vitals: BP 146/80 mmHg  Ht 5\' 6"  (1.676 m)  Wt 186 lb (84.369 kg)  BMI 30.04 kg/m2  Tobacco History  Smoking status  . Former Smoker  . Quit date: 08/27/1984  Smokeless tobacco  . Never Used    Comment: smoked age [age], up to 1.5-2 ppd     Counseling given: Not Answered   Past Medical History  Diagnosis Date  . Arthritis   . Bilateral cataracts  Dr Kathrin Penner  . Hyperlipidemia   . Hypertension    Past Surgical History  Procedure Laterality Date  . Ovarian cyst removal    . 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 Colonoscopy      Dr Fuller Plan   Family History  Problem Relation Age of Onset  . Adopted: Yes  . Colon cancer Neg Hx   . Stomach cancer Neg Hx    History  Sexual Activity  . Sexual Activity: Not on file    Outpatient Encounter Prescriptions as of 02/11/2015  Medication Sig  . aspirin 325 MG tablet Take 325 mg by mouth as needed.  Marland Kitchen aspirin 81 MG tablet Take 81 mg by mouth daily.  .  Cholecalciferol (VITAMIN D-3) 1000 UNITS CAPS Take 1 capsule by mouth daily.  Mariane Baumgarten Calcium (STOOL SOFTENER PO) Take by mouth. Takes 2 to 3 tablets daily  . hydrochlorothiazide (HYDRODIURIL) 25 MG tablet TAKE (1/2) TABLET DAILY.  . metoprolol (LOPRESSOR) 100 MG tablet TAKE (1/2) TABLET TWICE DAILY.  . Multiple Vitamin (MULTIVITAMIN) tablet Take 1 tablet by mouth daily.  . simvastatin (ZOCOR) 40 MG tablet TAKE 1 TABLET EACH DAY.  Marland Kitchen zoster vaccine live, PF, (ZOSTAVAX) 84166 UNT/0.65ML injection Inject 19,400 Units into the skin once.   No facility-administered encounter medications on file as of 02/11/2015.    Activities of Daily Living In your present state of health, do you have any difficulty performing the following activities: 02/11/2015  Hearing? Y  Vision? N  Difficulty concentrating or making decisions? N  Walking or climbing stairs? N  Dressing or bathing? N  Doing errands, shopping? N  Preparing Food and eating ? N  Using the Toilet? N  In the past six months, have you accidently leaked urine? N  Managing your Medications? N  Managing your Finances? N  Housekeeping or managing your Housekeeping? N    Patient Care Team: Hendricks Limes, MD as PCP - General    Assessment:     Assessment   Patient presents for yearly preventative medicine examination. Medicare questionnaire screening were completed, i.e. Functional; fall risk; depression, memory loss and hearing. Denies depression but does feel overwhelmed at times.  All immunizations and health maintenance protocols were reviewed with the patient and needed orders were placed. Will be notified when eye exam due Needs a hearing screen; will fup; referred to [facility] for assistance with hearing aid Dexa; Defer to Dr. Ephriam Knuckles; deferred; will take flu today and prevnar in 2 weeks;   Education provided for laboratory screens;    Medication reconciliation, past medical history, social history, problem list  and allergies were reviewed in detail with the patient  Goals were established with regard to weight loss, exercise, and diet in compliance with medications based on the patient individualized risk;    End of life planning was discussed. The patient took information and HCPOA forms    Exercise Activities and Dietary recommendations Current Exercise Habits:: Home exercise routine, Type of exercise: walking, Time (Minutes): 30 (did walk x 30 minutes; ), Frequency (Times/Week): 3, Weekly Exercise (Minutes/Week): 90, Intensity: Moderate  Goals    . patient to consider     Will put some thought into what she can do; Stressors evaluated;  Gave information for [place] [employer] / Family information and training  Get help & support Media planner  Support groups  Education programs  [address], [address] [place],  [place] [address] P: [id number] F: [phone]  Families to call the 800 number to get information on all the resources in the area 24 hour [phone]  Can provide resources; Questions about Dementia; Support groups; Give the caregiver information regarding respite ([facility]) Call the [place] on Aging;as they oversee who gets the grant fund for certain areas ([phone]) Also have Tools to help navigate the needs of the patient  Also gave information on [place] Response program        Fall Risk Fall Risk  02/11/2015 10/31/2012  Falls in the past year? No No   Depression Screen PHQ 2/9 Scores 02/11/2015 10/31/2012  PHQ - 2 Score 0 0     Cognitive Testing No flowsheet data found.   AD8 Score 0; no identified issues voiced although mother in law has dementia  Immunization History  Administered Date(s) Administered  . Influenza Split 02/18/2011, 02/18/2012  . Influenza Whole 03/02/2007, 02/03/2008, 02/08/2009, 02/25/2010  . Influenza, High Dose Seasonal PF 02/14/2013    . Influenza,inj,Quad PF,36+ Mos 02/20/2014, 02/11/2015  . Td 03/03/2010  . Zoster 11/02/2012   Screening Tests Health Maintenance  Topic Date Due  . Hepatitis C Screening  13-Dec-1945  . DEXA SCAN  07/08/2009  . PNA vac Low Risk Adult (1 of 2 - PCV13) 07/08/2009  . INFLUENZA VACCINE  12/03/2014  . MAMMOGRAM  12/02/2016  . TETANUS/TDAP  03/03/2020  . COLONOSCOPY  09/10/2021  . ZOSTAVAX  Completed      Plan:   To discuss Dexa with physician tomorrow To fup on Prevnar in 2 weeks  Will fup on hearing and eye exam.  Will consider waking tiw if able; Given information on community caregiving groups  Considering Medical Nutrition Mgmt for spouse, which may help her with pre-diabetes and decreasing GFR.   During the course of the visit the patient was educated and counseled about the following appropriate screening and preventive services:   Vaccines to include Pneumoccal, Influenza, Hepatitis B, Td, Zostavax, HCV  Electrocardiogram  Cardiovascular Disease 10/2014  Colorectal cancer screening completed 09/2011  Bone density screening/ to fup with Dr. [name]  Diabetes screening/ educated on pre-diabetes; BS 115 and was a fasting.   Glaucoma screening / to have eye exam this year  Mammography/ completed  Nutrition counseling; Reviewed and educated  Patient Instructions (the written plan) was given to the patient.   OHFGB,MSXJD, RN  02/11/2015     Medical screening examination/treatment/procedure(s) were performed by non-physician practitioner and as supervising physician I was immediately available for consultation/collaboration. I agree with above. Binnie Rail, MD

## 2015-02-11 NOTE — Patient Instructions (Addendum)
Ms. Sharon Knight , Thank you for taking time to come for your Medicare Wellness Visit. I appreciate your ongoing commitment to your health goals. Please review the following plan we discussed and let me know if I can assist you in the future.   Needs hearing screen; hearing < 500hz  in both ears/ will discuss referral   Diabetes and weight loss; [email or web address]  Eyes exam is due  [employer]; [phone] [address]; [place] [phone]  [email or web address]  [email or web address]  [facility] [place] / call to see if they can assist with hearing aid  No reviews  [facility]  [place] [address]  [phone]  ----   Will take flu shot   The [facility] for long term care resources/ can send out resources;  Contact us [phone]   These are the goals we discussed: Goals    . patient to consider     Will put some thought into what she can do; Stressors evaluated;  Gave information for [place] [employer] / Family information and training  Get help & support [place]  Message boards  Support groups  Education programs  [place], [address] [place], [place] [address] P: [phone] F: [phone]  Families to call the 800 number to get information on all the resources in the area 24 hour 1 [phone]  Can provide resources; Questions about Dementia; Support groups; Give the caregiver information regarding respite ([facility]) Call the [place] on Aging;as they oversee who gets the grant fund for certain areas ([phone]) Also have Tools to help navigate the needs of the patient  Also gave information on [place] program         This is a list of the screening recommended for you and  due dates:  Health Maintenance  Topic Date Due  .  Hepatitis C: One time screening is recommended by Center for Disease Control  (CDC) for  adults born from [birth year] through 1966.   08/07/1945  . DEXA scan (bone density measurement)  07/08/2009  . Pneumonia vaccines (1 of 2 - PCV13) 07/08/2009  . Flu Shot  12/03/2014  . Mammogram  12/02/2016  . Tetanus Vaccine  03/03/2020  . Colon Cancer Screening  09/10/2021  . Shingles Vaccine  Completed    Basic Carbohydrate Counting for Diabetes Mellitus (PRE DIABETIC) Carbohydrate counting is a method for keeping track of the amount of carbohydrates you eat. Eating carbohydrates naturally increases the level of sugar (glucose) in your blood, so it is important for you to know the amount that is okay for you to have in every meal. Carbohydrate counting helps keep the level of glucose in your blood within normal limits. The amount of carbohydrates allowed is different for every person. A dietitian can help you calculate the amount that is right for you. Once you know the amount of carbohydrates you can have, you can count the carbohydrates in the foods you want to eat. Carbohydrates are found in the following foods:  Grains, such as breads and cereals.  Dried beans and soy products.  Starchy vegetables, such as potatoes, peas, and corn.  Fruit and fruit juices.  Milk and yogurt.  Sweets and snack foods, such as cake, cookies, candy, chips, soft drinks, and fruit drinks. CARBOHYDRATE COUNTING There are two ways to count the carbohydrates in your food. You can use either of the methods  or a combination of both. Reading the "Nutrition Facts" on [place] The "Nutrition Facts" is an area that is included on the labels of almost all packaged food and beverages in the [place]. It includes the serving size of that food or beverage and information about the nutrients in each serving of the food, including the grams (g) of carbohydrate per serving.   Decide the number of servings of this food or beverage that you will be able to eat or drink. Multiply that number of servings by the number of grams of carbohydrate that is listed on the label for that serving. The total will be the amount of carbohydrates you will be having when you eat or drink this food or beverage. Learning Standard Serving Sizes of Food When you eat food that is not packaged or does not include "Nutrition Facts" on the label, you need to measure the servings in order to count the amount of carbohydrates.A serving of most carbohydrate-rich foods contains about 15 g of carbohydrates. The following list includes serving sizes of carbohydrate-rich foods that provide 15 g ofcarbohydrate per serving:   1 slice of bread (1 oz) or 1 six-inch tortilla.    of a hamburger bun or English muffin.  4-6 crackers.   cup unsweetened dry cereal.    cup hot cereal.   cup rice or pasta.    cup mashed potatoes or  of a large baked potato.  1 cup fresh fruit or one small piece of fruit.    cup canned or frozen fruit or fruit juice.  1 cup milk.   cup plain fat-free yogurt or yogurt sweetened with artificial sweeteners.   cup cooked dried beans or starchy vegetable, such as peas, corn, or potatoes.  Decide the number of standard-size servings that you will eat. Multiply that number of servings by 15 (the grams of carbohydrates in that serving). For example, if you eat 2 cups of strawberries, you will have eaten 2 servings and 30 g of carbohydrates (2 servings x 15 g = 30 g). For foods such as soups and casseroles, in which more than one food is mixed in, you will need to count the carbohydrates in each food that is included. EXAMPLE OF CARBOHYDRATE COUNTING Sample Dinner  3 oz chicken breast.   cup of Alber[place]en rice.   cup of corn.  1 cup milk.   1 cup strawberries with sugar-free whipped topping.  Carbohydrate Calculation Step 1: Identify the foods that  contain carbohydrates:   Rice.   Corn.   Milk.   Strawberries. Step 2:Calculate the number of servings eaten of each:   2 servings of rice.   1 serving of corn.   1 serving of milk.   1 serving of strawberries. Step 3: Multiply each of those number of servings by 15 g:   2 servings of rice x 15 g = 30 g.   1 serving of corn x 15 g = 15 g.   1 serving of milk x 15 g = 15 g.   1 serving of strawberries x 15 g = 15 g. Step 4: Add together all of the amounts to find the total grams of carbohydrates eaten: 30 g + 15 g + 15 g + 15 g = 75 g.   This information is not intended to replace advice given to you by your health care provider. Make sure you discuss any questions you have with your health care provider.   Document Released: 04/20/2005  Document Revised: 05/11/2014 Document Reviewed: 03/17/2013 Elsevier Interactive Patient Education 2016 [employer].  Osteoporosis Osteoporosis is the thinning and loss of density in the bones. Osteoporosis makes the bones more brittle, fragile, and likely to break (fracture). Over time, osteoporosis can cause the bones to become so weak that they fracture after a simple fall. The bones most likely to fracture are the bones in the hip, wrist, and spine. CAUSES  The exact cause is not known. RISK FACTORS Anyone can develop osteoporosis. You may be at greater risk if you have a family history of the condition or have poor nutrition. You may also have a higher risk if you are:   Female.   53 years old or older.  A smoker.  Not physically active.   White or Asian.  Slender. SIGNS AND SYMPTOMS  A fracture might be the first sign of the disease, especially if it results from a fall or injury that would not usually cause a bone to break. Other signs and symptoms include:   Low back and neck pain.  Stooped posture.  Height loss. DIAGNOSIS  To make a diagnosis, your health care provider may:  Take a medical  history.  Perform a physical exam.  Order tests, such as:  A bone mineral density test.  A dual-energy X-ray absorptiometry test. TREATMENT  The goal of osteoporosis treatment is to strengthen your bones to reduce your risk of a fracture. Treatment may involve:  Making lifestyle changes, such as:  Eating a diet rich in calcium.  Doing weight-bearing and muscle-strengthening exercises.  Stopping tobacco use.  Limiting alcohol intake.  Taking medicine to slow the process of bone loss or to increase bone density.  Monitoring your levels of calcium and vitamin D. HOME CARE INSTRUCTIONS  Include calcium and vitamin D in your diet. Calcium is important for bone health, and vitamin D helps the body absorb calcium.  Perform weight-bearing and muscle-strengthening exercises as directed by your health care provider.  Do not use any tobacco products, including cigarettes, chewing tobacco, and electronic cigarettes. If you need help quitting, ask your health care provider.  Limit your alcohol intake.  Take medicines only as directed by your health care provider.  Keep all follow-up visits as directed by your health care provider. This is important.  Take precautions at home to lower your risk of falling, such as:  Keeping rooms well lit and clutter free.  Installing safety rails on stairs.  Using rubber mats in the bathroom and other areas that are often wet or slippery. SEEK IMMEDIATE MEDICAL CARE IF:  You fall or injure yourself.    This information is not intended to replace advice given to you by your health care provider. Make sure you discuss any questions you have with your health care provider.   Document Released: 01/28/2005 Document Revised: 05/11/2014 Document Reviewed: 09/28/2013 Elsevier Interactive Patient Education 2016 [place].   Bone Densitometry Bone densitometry is an imaging test that uses a special X-ray to measure the amount of calcium and other  minerals in your bones (bone density). This test is also known as a bone mineral density test or dual-energy X-ray absorptiometry (DXA). The test can measure bone density at your hip and your spine. It is similar to having a regular X-ray. You may have this test to:  Diagnose a condition that causes weak or thin bones (osteoporosis).  Predict your risk of a broken bone (fracture).  Determine how well osteoporosis treatment is working. LET YOUR  HEALTH CARE PROVIDER KNOW ABOUT:  Any allergies you have.  All medicines you are taking, including vitamins, herbs, eye drops, creams, and over-the-counter medicines.  Previous problems you or members of your family have had with the use of anesthetics.  Any blood disorders you have.  Previous surgeries you have had.  Medical conditions you have.  Possibility of pregnancy.  Any other medical test you had within the previous 14 days that used contrast material. RISKS AND COMPLICATIONS Generally, this is a safe procedure. However, problems can occur and may include the following:  This test exposes you to a very small amount of radiation.  The risks of radiation exposure may be greater to unborn children. BEFORE THE PROCEDURE  Do not take any calcium supplements for 24 hours before having the test. You can otherwise eat and drink what you usually do.  Take off all metal jewelry, eyeglasses, dental appliances, and any other metal objects. PROCEDURE  You may lie on an exam table. There will be an X-ray generator below you and an imaging device above you.  Other devices, such as boxes or braces, may be used to position your body properly for the scan.  You will need to lie still while the machine slowly scans your body.  The images will show up on a computer monitor. AFTER THE PROCEDURE You may need more testing at a later time.   This information is not intended to replace advice given to you by your health care provider. Make sure  you discuss any questions you have with your health care provider.   Document Released: 05/12/2004 Document Revised: 05/11/2014 Document Reviewed: 09/28/2013 Elsevier Interactive Patient Education 2016 Elsevier Inc.  Fat and Cholesterol Restricted Diet Getting too much fat and cholesterol in your diet may cause health problems. Following this diet helps keep your fat and cholesterol at normal levels. This can keep you from getting sick. WHAT TYPES OF FAT SHOULD I CHOOSE?  Choose monosaturated and polyunsaturated fats. These are found in foods such as olive oil, canola oil, flaxseeds, walnuts, almonds, and seeds.  Eat more omega-3 fats. Good choices include salmon, mackerel, sardines, tuna, flaxseed oil, and ground flaxseeds.  Limit saturated fats. These are in animal products such as meats, butter, and cream. They can also be in plant products such as palm oil, palm kernel oil, and coconut oil.   Avoid foods with partially hydrogenated oils in them. These contain trans fats. Examples of foods that have trans fats are stick margarine, some tub margarines, cookies, crackers, and other baked goods. WHAT GENERAL GUIDELINES DO I NEED TO FOLLOW?   Check food labels. Look for the words "trans fat" and "saturated fat."  When preparing a meal:  Fill half of your plate with vegetables and green salads.  Fill one fourth of your plate with whole grains. Look for the word "whole" as the first word in the ingredient list.  Fill one fourth of your plate with lean protein foods.  Limit fruit to two servings a day. Choose fruit instead of juice.  Eat more foods with soluble fiber. Examples of foods with this type of fiber are apples, broccoli, carrots, beans, peas, and barley. Try to get 20-30 g (grams) of fiber per day.  Eat more home-cooked foods. Eat less at restaurants and buffets.  Limit or avoid alcohol.  Limit foods high in starch and sugar.  Limit fried foods.  Cook foods without  frying them. Baking, boiling, grilling, and broiling are all great options.  Lose weight if you are overweight. Losing even a small amount of weight can help your overall health. It can also help prevent diseases such as diabetes and heart disease. WHAT FOODS CAN I EAT? Grains Whole grains, such as whole wheat or whole grain breads, crackers, cereals, and pasta. Unsweetened oatmeal, bulgur, barley, quinoa, or [place] rice. Corn or whole wheat flour tortillas. Vegetables Fresh or frozen vegetables (raw, steamed, roasted, or grilled). Green salads. Fruits All fresh, canned (in natural juice), or frozen fruits. Meat and Other Protein Products Ground beef (85% or leaner), grass-fed beef, or beef trimmed of fat. Skinless chicken or [place]. Ground chicken or [place]. Pork trimmed of fat. All fish and seafood. Eggs. Dried beans, peas, or lentils. Unsalted nuts or seeds. Unsalted canned or dry beans. Dairy Low-fat dairy products, such as skim or 1% milk, 2% or reduced-fat cheeses, low-fat ricotta or cottage cheese, or plain low-fat yogurt. Fats and Oils Tub margarines without trans fats. Light or reduced-fat mayonnaise and salad dressings. Avocado. Olive, canola, sesame, or safflower oils. Natural peanut or almond butter (choose ones without added sugar and oil). The items listed above may not be a complete list of recommended foods or beverages. Contact your dietitian for more options. WHAT FOODS ARE NOT RECOMMENDED? Grains White bread. White pasta. White rice. Cornbread. Bagels, pastries, and croissants. Crackers that contain trans fat. Vegetables White potatoes. Corn. Creamed or fried vegetables. Vegetables in a cheese sauce. Fruits Dried fruits. Canned fruit in light or heavy syrup. Fruit juice. Meat and Other Protein Products Fatty cuts of meat. Ribs, chicken wings, bacon, sausage, bologna, salami, chitterlings, fatback, hot dogs, bratwurst, and packaged luncheon meats. Liver and organ  meats. Dairy Whole or 2% milk, cream, half-and-half, and cream cheese. Whole milk cheeses. Whole-fat or sweetened yogurt. Full-fat cheeses. Nondairy creamers and whipped toppings. Processed cheese, cheese spreads, or cheese curds. Sweets and Desserts Corn syrup, sugars, honey, and molasses. Candy. Jam and jelly. Syrup. Sweetened cereals. Cookies, pies, cakes, donuts, muffins, and ice cream. Fats and Oils Butter, stick margarine, lard, shortening, ghee, or bacon fat. Coconut, palm kernel, or palm oils. Beverages Alcohol. Sweetened drinks (such as sodas, lemonade, and fruit drinks or punches). The items listed above may not be a complete list of foods and beverages to avoid. Contact your dietitian for more information.   This information is not intended to replace advice given to you by your health care provider. Make sure you discuss any questions you have with your health care provider.   Document Released: 10/20/2011 Document Revised: 05/11/2014 Document Reviewed: 07/20/2013 Elsevier Interactive Patient Education 2016 [place] for Type 2 Diabetes Screening is a way to check for type 2 diabetes in people who do not have symptoms of the disease, but who may likely develop diabetes in the future. Diabetes can lead to serious health problems, but finding diabetes early allows for early treatment. DIABETES RISK FACTORS   Family history of diabetes.  Diseases of the pancreas.  Obesity or being overweight.  Certain racial or ethnic groups:  American [place].  Pacific Islander.  Hispanic.  Asian.  African American.  High blood pressure (hypertension).  History of diabetes while pregnant (gestational diabetes).  Delivering a baby that weighed over 9 pounds.  Being inactive.  High cholesterol or triglycerides.  Age, especially over 70 years of age.  Other diseases or conditions.  Diseases of the pancreas.  Cardiovascular disease.  Disorders of the  endocrine system.  Certain medicines, such as those that treat  high blood cholesterol levels. WHO IS SCREENED Adults  Adults who have no risk factors and no symptoms should be screened starting at age 75. If the screening tests are normal, they should be repeated every 3 years.  Adults who do not have symptoms, but have 1 or more risk factors, should be screened.  Adults who have 2 or more risk factors may be screened every year.  Adults who have an A1c (3 month average of blood glucose) greater than 5.7% or who had an impaired glucose tolerance (IGT) or impaired fasting glucose (IFG) on a previous test should be screened.  Pregnant women who have risk factors should be screened at their first prenatal visit.  Women who have given birth and had gestational diabetes should be screened 6-12 weeks after the child is born. This screening should be repeated every 1-3 years after the first test. Children or Adolescents  Children and adolescents should be screened for type 2 diabetes if they are overweight and have 2 of the following risk factors:  Having a family history of type 2 diabetes.  Being a member of a high risk race or ethnic group.  Having signs of insulin resistance or conditions associated with insulin resistance.  Having a mother who had gestational diabetes while pregnant with him or her.  Screening should start at age 26 or at the onset of puberty, whichever comes first. This should be repeated every 2 years. SCREENING In a screening, your caregiver may:  Ask questions about your overall health. This will include questions about the health of close family members, too.  Ask about any diabetes-like symptoms you may have.  Perform a physical exam.  Order some tests that may include:  A fasting plasma glucose test. This measures the level of glucose in your blood. It is done after you have had nothing to eat but water (fasted) for 8 hours.  A random blood glucose  test. This test is done without the need to fast.  An oral glucose tolerance test. This is a blood test done in 2 parts. First, a blood sample is taken after you have fasted. Then, another sample is taken after you drink a liquid that contains a lot of sugar.  An A1c test. This test shows how much glucose has been in your blood over the past 2 to 3 months.   This information is not intended to replace advice given to you by your health care provider. Make sure you discuss any questions you have with your health care provider.   Document Released: 02/14/2009 Document Revised: 05/11/2014 Document Reviewed: 11/26/2010 Elsevier Interactive Patient Education 2016 [place] in the Home  Falls can cause injuries. They can happen to people of all ages. There are many things you can do to make your home safe and to help prevent falls.  WHAT CAN I DO ON THE OUTSIDE OF MY HOME?  Regularly fix the edges of walkways and driveways and fix any cracks.  Remove anything that might make you trip as you walk through a door, such as a raised step or threshold.  Trim any bushes or trees on the path to your home.  Use bright outdoor lighting.  Clear any walking paths of anything that might make someone trip, such as rocks or tools.  Regularly check to see if handrails are loose or broken. Make sure that both sides of any steps have handrails.  Any raised decks and porches should have guardrails on the  edges.  Have any leaves, snow, or ice cleared regularly.  Use sand or salt on walking paths during winter.  Clean up any spills in your garage right away. This includes oil or grease spills. WHAT CAN I DO IN THE BATHROOM?   Use night lights.  Install grab bars by the toilet and in the tub and shower. Do not use towel bars as grab bars.  Use non-skid mats or decals in the tub or shower.  If you need to sit down in the shower, use a plastic, non-slip stool.  Keep the floor dry.  Clean up any water that spills on the floor as soon as it happens.  Remove soap buildup in the tub or shower regularly.  Attach bath mats securely with double-sided non-slip rug tape.  Do not have throw rugs and other things on the floor that can make you trip. WHAT CAN I DO IN THE BEDROOM?  Use night lights.  Make sure that you have a light by your bed that is easy to reach.  Do not use any sheets or blankets that are too big for your bed. They should not hang down onto the floor.  Have a firm chair that has side arms. You can use this for support while you get dressed.  Do not have throw rugs and other things on the floor that can make you trip. WHAT CAN I DO IN THE KITCHEN?  Clean up any spills right away.  Avoid walking on wet floors.  Keep items that you use a lot in easy-to-reach places.  If you need to reach something above you, use a strong step stool that has a grab bar.  Keep electrical cords out of the way.  Do not use floor polish or wax that makes floors slippery. If you must use wax, use non-skid floor wax.  Do not have throw rugs and other things on the floor that can make you trip. WHAT CAN I DO WITH MY STAIRS?  Do not leave any items on the stairs.  Make sure that there are handrails on both sides of the stairs and use them. Fix handrails that are broken or loose. Make sure that handrails are as long as the stairways.  Check any carpeting to make sure that it is firmly attached to the stairs. Fix any carpet that is loose or worn.  Avoid having throw rugs at the top or bottom of the stairs. If you do have throw rugs, attach them to the floor with carpet tape.  Make sure that you have a light switch at the top of the stairs and the bottom of the stairs. If you do not have them, ask someone to add them for you. WHAT ELSE CAN I DO TO HELP PREVENT FALLS?  Wear shoes that:  Do not have high heels.  Have rubber bottoms.  Are comfortable and fit you  well.  Are closed at the toe. Do not wear sandals.  If you use a stepladder:  Make sure that it is fully opened. Do not climb a closed stepladder.  Make sure that both sides of the stepladder are locked into place.  Ask someone to hold it for you, if possible.  Clearly mark and make sure that you can see:  Any grab bars or handrails.  First and last steps.  Where the edge of each step is.  Use tools that help you move around (mobility aids) if they are needed. These include:  Canes.  Walkers.  Scooters.  Crutches.  Turn on the lights when you go into a dark area. Replace any light bulbs as soon as they burn out.  Set up your furniture so you have a clear path. Avoid moving your furniture around.  If any of your floors are uneven, fix them.  If there are any pets around you, be aware of where they are.  Review your medicines with your doctor. Some medicines can make you feel dizzy. This can increase your chance of falling. Ask your doctor what other things that you can do to help prevent falls.   This information is not intended to replace advice given to you by your health care provider. Make sure you discuss any questions you have with your health care provider.   Document Released: 02/14/2009 Document Revised: 09/04/2014 Document Reviewed: 05/25/2014 Elsevier Interactive Patient Education [employer].

## 2015-02-12 ENCOUNTER — Encounter: Payer: Self-pay | Admitting: Internal Medicine

## 2015-02-12 ENCOUNTER — Ambulatory Visit: Payer: Medicare Other

## 2015-02-12 ENCOUNTER — Ambulatory Visit (INDEPENDENT_AMBULATORY_CARE_PROVIDER_SITE_OTHER): Payer: Medicare Other | Admitting: Internal Medicine

## 2015-02-12 VITALS — BP 132/82 | HR 55 | Temp 98.5°F | Resp 16 | Ht 66.0 in | Wt 186.0 lb

## 2015-02-12 DIAGNOSIS — R739 Hyperglycemia, unspecified: Secondary | ICD-10-CM | POA: Diagnosis not present

## 2015-02-12 DIAGNOSIS — E785 Hyperlipidemia, unspecified: Secondary | ICD-10-CM

## 2015-02-12 DIAGNOSIS — Z23 Encounter for immunization: Secondary | ICD-10-CM | POA: Diagnosis not present

## 2015-02-12 DIAGNOSIS — M8580 Other specified disorders of bone density and structure, unspecified site: Secondary | ICD-10-CM

## 2015-02-12 DIAGNOSIS — I10 Essential (primary) hypertension: Secondary | ICD-10-CM

## 2015-02-12 NOTE — Assessment & Plan Note (Addendum)
June 2016 a1c was 5.9% Not currently exercising, will start walking Decrease carbs/sugars  Work on weight loss Recheck a1c in 6 months

## 2015-02-12 NOTE — Progress Notes (Signed)
Subjective:    Patient ID: Sharon Knight, female    DOB: Jun 02, 1945, 69 y.o.   MRN: [id number]  HPI She is here to establish with a new pcp. She is here for follow up and has no specific questions.    Hypertension: She is taking her medication daily. She is not compliant with a low sodium diet.  She denies chest pain, edema, shortness of breath and regular headaches. She is not exercising regularly.    Hyperlipidemia: She is taking her medication daily. She is not always compliant with a low fat/cholesterol diet. She is not exercising regularly. She denies myalgias.   Hyperglycemia: Her a1c a few months ago was 5.9%.  She is not currently exericsing, but plans on restarting walking.  She is not complaint with a low carb/sugar diet.   Osteopenia:  She is not currently exercising.  She is taking her vitamin d daily.    She cares for her mother in law - goes to her house daily and that inhibits her from walking some days.  She also does not walk in the hot weather.     Medications and allergies reviewed with patient and updated if appropriate.  Patient Active Problem List   Diagnosis Date Noted  . History of colonic polyps 10/22/2014  . 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 Vitamin D deficiency 10/31/2012  . ECZEMA 03/03/2010  . Hyperglycemia 02/08/2009  . 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 Hyperlipidemia 08/02/2007  . Essential hypertension 08/02/2007  . Osteopenia 08/02/2007    Past Medical History  Diagnosis Date  . Arthritis   . Bilateral cataracts     Dr Kathrin Penner  . Hyperlipidemia   . Hypertension     Past Surgical History  Procedure Laterality Date  . Ovarian cyst removal    . 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 Colonoscopy      Dr Fuller Plan    Social History   Social History  . Marital Status: Married    Spouse Name: N/A  . Number of Children: N/A  . Years of Education: N/A   Social History Main Topics  . Smoking status: Former  Smoker    Quit date: 08/27/1984  . Smokeless tobacco: Never Used     Comment: smoked age [age], up to 1.5-2 ppd  . Alcohol Use: 1.8 oz/week    3 Cans of beer per week     Comment: 3 x weekly  . Drug Use: No  . Sexual Activity: Not Asked   Other Topics Concern  . None   Social History Narrative    Review of Systems  Constitutional: Negative for fever, chills and fatigue.  Respiratory: Negative for cough, shortness of breath and wheezing.   Cardiovascular: Positive for palpitations (occasional, transient). Negative for chest pain and leg swelling.  Gastrointestinal: Negative for abdominal pain.  Musculoskeletal: Positive for arthralgias (arthritis).  Neurological: Positive for dizziness (BPPV intermittently). Negative for light-headedness and headaches.       Objective:   Filed Vitals:   02/12/15 0945  BP: 132/82  Pulse: 55  Temp: 98.5 F (36.9 C)  Resp: 16   Filed Weights   02/12/15 0945  Weight: 186 lb (84.369 kg)   Body mass index is 30.04 kg/(m^2).   Physical Exam  Constitutional: She is oriented to person, place, and time. She appears well-developed and well-nourished. No distress.  HENT:  Head: Normocephalic and atraumatic.  Eyes: Conjunctivae are normal.  Neck:  Normal range of motion. Neck supple. No tracheal deviation present. No thyromegaly present.  Cardiovascular: Normal rate, regular rhythm and normal heart sounds.   No murmur heard. Pulmonary/Chest: Effort normal and breath sounds normal. No respiratory distress. She has no wheezes.  Musculoskeletal: She exhibits no edema.  Lymphadenopathy:    She has no cervical adenopathy.  Neurological: She is alert and oriented to person, place, and time.  Psychiatric: She has a normal mood and affect. Her behavior is normal.        Assessment & Plan:   Prevnar today  See Problem List.  Follow up in 6 months

## 2015-02-12 NOTE — Assessment & Plan Note (Addendum)
On simvastatin Last lipid panel was ok Stressed lifestyle changes - increased exercise, weight loss and a more healthy diet Recheck lipid panel in 6 months

## 2015-02-12 NOTE — Progress Notes (Signed)
Pre visit review using our clinic review tool, if applicable. No additional management support is needed unless otherwise documented below in the visit note. 

## 2015-02-12 NOTE — Assessment & Plan Note (Addendum)
bp controlled today and at goal cmp up to date Stressed lifestyle changes Continue current medication at current dose

## 2015-02-12 NOTE — Assessment & Plan Note (Signed)
Due for dexa - ordered today Stressed regular exercise - she will start walking Continue vitamin D Encouraged high calcium diet - may need to start calcium supplementation Has been on fosamax for 4 years (2006-2010)

## 2015-02-12 NOTE — Patient Instructions (Addendum)
We have reviewed your prior records including labs and tests today.   All other Health Maintenance issues reviewed.   All recommended immunizations and age-appropriate screenings are up-to-date.  Prevnar pneumonia vaccine administered today.   Medications reviewed and updated.  No changes recommended at this time.   A dexa (bone density was ordered).  Please schedule followup in 6 months for follow up.  Exercising to Lose Weight Exercising can help you to lose weight. In order to lose weight through exercise, you need to do vigorous-intensity exercise. You can tell that you are exercising with vigorous intensity if you are breathing very hard and fast and cannot hold a conversation while exercising. Moderate-intensity exercise helps to maintain your current weight. You can tell that you are exercising at a moderate level if you have a higher heart rate and faster breathing, but you are still able to hold a conversation. HOW OFTEN SHOULD I EXERCISE? Choose an activity that you enjoy and set realistic goals. Your health care provider can help you to make an activity plan that works for you. Exercise regularly as directed by your health care provider. This may include:  Doing resistance training twice each week, such as:  Push-ups.  Sit-ups.  Lifting weights.  Using resistance bands.  Doing a given intensity of exercise for a given amount of time. Choose from these options:  150 minutes of moderate-intensity exercise every week.  75 minutes of vigorous-intensity exercise every week.  A mix of moderate-intensity and vigorous-intensity exercise every week. Children, pregnant women, people who are out of shape, people who are overweight, and older adults may need to consult a health care provider for individual recommendations. If you have any sort of medical condition, be sure to consult your health care provider before starting a new exercise program. WHAT ARE SOME ACTIVITIES THAT  CAN HELP ME TO LOSE WEIGHT?   Walking at a rate of at least 4.5 miles an hour.  Jogging or running at a rate of 5 miles per hour.  Biking at a rate of at least 10 miles per hour.  Lap swimming.  Roller-skating or in-line skating.  Cross-country skiing.  Vigorous competitive sports, such as football, basketball, and soccer.  Jumping rope.  Aerobic dancing. HOW CAN I BE MORE ACTIVE IN MY DAY-TO-DAY ACTIVITIES?  Use the stairs instead of the elevator.  Take a walk during your lunch break.  If you drive, park your car farther away from work or school.  If you take public transportation, get off one stop early and walk the rest of the way.  Make all of your phone calls while standing up and walking around.  Get up, stretch, and walk around every 30 minutes throughout the day. WHAT GUIDELINES SHOULD I FOLLOW WHILE EXERCISING?  Do not exercise so much that you hurt yourself, feel dizzy, or get very short of breath.  Consult your health care provider prior to starting a new exercise program.  Wear comfortable clothes and shoes with good support.  Drink plenty of water while you exercise to prevent dehydration or heat stroke. Body water is lost during exercise and must be replaced.  Work out until you breathe faster and your heart beats faster.   This information is not intended to replace advice given to you by your health care provider. Make sure you discuss any questions you have with your health care provider.   Document Released: 05/23/2010 Document Revised: 05/11/2014 Document Reviewed: 09/21/2013 Elsevier Interactive Patient Education [employer].

## 2015-02-20 ENCOUNTER — Encounter: Payer: Self-pay | Admitting: Gastroenterology

## 2015-05-23 ENCOUNTER — Other Ambulatory Visit: Payer: Self-pay | Admitting: Internal Medicine

## 2015-07-03 ENCOUNTER — Ambulatory Visit (INDEPENDENT_AMBULATORY_CARE_PROVIDER_SITE_OTHER)
Admission: RE | Admit: 2015-07-03 | Discharge: 2015-07-03 | Disposition: A | Discharge status: Home / Self Care | Payer: Medicare Other | Source: Ambulatory Visit | Attending: Internal Medicine | Admitting: Internal Medicine

## 2015-07-03 DIAGNOSIS — M8580 Other specified disorders of bone density and structure, unspecified site: Secondary | ICD-10-CM

## 2015-07-11 DIAGNOSIS — H04123 Dry eye syndrome of bilateral lacrimal glands: Secondary | ICD-10-CM | POA: Diagnosis not present

## 2015-07-11 DIAGNOSIS — H16103 Unspecified superficial keratitis, bilateral: Secondary | ICD-10-CM | POA: Diagnosis not present

## 2015-07-11 DIAGNOSIS — H25813 Combined forms of age-related cataract, bilateral: Secondary | ICD-10-CM | POA: Diagnosis not present

## 2015-10-01 DIAGNOSIS — H25042 Posterior subcapsular polar age-related cataract, left eye: Secondary | ICD-10-CM | POA: Diagnosis not present

## 2015-10-01 DIAGNOSIS — H268 Other specified cataract: Secondary | ICD-10-CM | POA: Diagnosis not present

## 2015-10-01 DIAGNOSIS — H2512 Age-related nuclear cataract, left eye: Secondary | ICD-10-CM | POA: Diagnosis not present

## 2015-10-01 DIAGNOSIS — H25032 Anterior subcapsular polar age-related cataract, left eye: Secondary | ICD-10-CM | POA: Diagnosis not present

## 2015-10-15 DIAGNOSIS — H2511 Age-related nuclear cataract, right eye: Secondary | ICD-10-CM | POA: Diagnosis not present

## 2015-10-15 DIAGNOSIS — H269 Unspecified cataract: Secondary | ICD-10-CM | POA: Diagnosis not present

## 2015-10-15 DIAGNOSIS — H21561 Pupillary abnormality, right eye: Secondary | ICD-10-CM | POA: Diagnosis not present

## 2015-10-15 DIAGNOSIS — H25011 Cortical age-related cataract, right eye: Secondary | ICD-10-CM | POA: Diagnosis not present

## 2015-10-15 DIAGNOSIS — H25041 Posterior subcapsular polar age-related cataract, right eye: Secondary | ICD-10-CM | POA: Diagnosis not present

## 2015-10-31 ENCOUNTER — Other Ambulatory Visit: Payer: Self-pay | Admitting: Internal Medicine

## 2015-11-17 ENCOUNTER — Other Ambulatory Visit: Payer: Self-pay | Admitting: Internal Medicine

## 2015-11-22 ENCOUNTER — Other Ambulatory Visit: Payer: Self-pay | Admitting: Obstetrics and Gynecology

## 2015-11-22 DIAGNOSIS — H02054 Trichiasis without entropian left upper eyelid: Secondary | ICD-10-CM | POA: Diagnosis not present

## 2015-11-22 DIAGNOSIS — Z1231 Encounter for screening mammogram for malignant neoplasm of breast: Secondary | ICD-10-CM

## 2015-12-25 ENCOUNTER — Ambulatory Visit: Payer: Medicare Other

## 2015-12-31 ENCOUNTER — Ambulatory Visit
Admission: RE | Admit: 2015-12-31 | Discharge: 2015-12-31 | Disposition: A | Discharge status: Home / Self Care | Payer: Medicare Other | Source: Ambulatory Visit | Attending: Obstetrics and Gynecology | Admitting: Obstetrics and Gynecology

## 2015-12-31 DIAGNOSIS — Z1231 Encounter for screening mammogram for malignant neoplasm of breast: Secondary | ICD-10-CM

## 2016-01-30 ENCOUNTER — Other Ambulatory Visit: Payer: Self-pay | Admitting: Internal Medicine

## 2016-02-05 ENCOUNTER — Ambulatory Visit (INDEPENDENT_AMBULATORY_CARE_PROVIDER_SITE_OTHER): Payer: Medicare Other | Admitting: Internal Medicine

## 2016-02-05 ENCOUNTER — Encounter: Payer: Self-pay | Admitting: Internal Medicine

## 2016-02-05 ENCOUNTER — Other Ambulatory Visit (INDEPENDENT_AMBULATORY_CARE_PROVIDER_SITE_OTHER): Payer: Medicare Other

## 2016-02-05 VITALS — BP 134/86 | HR 51 | Temp 98.0°F | Resp 16 | Ht 66.0 in | Wt 186.0 lb

## 2016-02-05 DIAGNOSIS — Z1159 Encounter for screening for other viral diseases: Secondary | ICD-10-CM

## 2016-02-05 DIAGNOSIS — Z0000 Encounter for general adult medical examination without abnormal findings: Secondary | ICD-10-CM

## 2016-02-05 DIAGNOSIS — Z23 Encounter for immunization: Secondary | ICD-10-CM

## 2016-02-05 DIAGNOSIS — I10 Essential (primary) hypertension: Secondary | ICD-10-CM | POA: Diagnosis not present

## 2016-02-05 DIAGNOSIS — E7800 Pure hypercholesterolemia, unspecified: Secondary | ICD-10-CM | POA: Diagnosis not present

## 2016-02-05 DIAGNOSIS — R7303 Prediabetes: Secondary | ICD-10-CM | POA: Diagnosis not present

## 2016-02-05 DIAGNOSIS — E559 Vitamin D deficiency, unspecified: Secondary | ICD-10-CM

## 2016-02-05 DIAGNOSIS — M8580 Other specified disorders of bone density and structure, unspecified site: Secondary | ICD-10-CM

## 2016-02-05 LAB — CBC WITH DIFFERENTIAL/PLATELET
BASOS ABS: 0 10*3/uL (ref 0.0–0.1)
Basophils Relative: 0.5 % (ref 0.0–3.0)
EOS ABS: 0.2 10*3/uL (ref 0.0–0.7)
Eosinophils Relative: 2.9 % (ref 0.0–5.0)
HEMATOCRIT: 41.8 % (ref 36.0–46.0)
Hemoglobin: 14.4 g/dL (ref 12.0–15.0)
LYMPHS PCT: 19.9 % (ref 12.0–46.0)
Lymphs Abs: 1.4 10*3/uL (ref 0.7–4.0)
MCHC: 34.5 g/dL (ref 30.0–36.0)
MCV: 90 fl (ref 78.0–100.0)
MONOS PCT: 7.4 % (ref 3.0–12.0)
Monocytes Absolute: 0.5 10*3/uL (ref 0.1–1.0)
NEUTROS ABS: 4.7 10*3/uL (ref 1.4–7.7)
Neutrophils Relative %: 69.3 % (ref 43.0–77.0)
PLATELETS: 277 10*3/uL (ref 150.0–400.0)
RBC: 4.65 Mil/uL (ref 3.87–5.11)
RDW: 12.6 % (ref 11.5–15.5)
WBC: 6.8 10*3/uL (ref 4.0–10.5)

## 2016-02-05 LAB — COMPREHENSIVE METABOLIC PANEL
ALT: 28 U/L (ref 0–35)
AST: 25 U/L (ref 0–37)
Albumin: 4 g/dL (ref 3.5–5.2)
Alkaline Phosphatase: 58 U/L (ref 39–117)
BUN: 13 mg/dL (ref 6–23)
CALCIUM: 9.6 mg/dL (ref 8.4–10.5)
CHLORIDE: 106 meq/L (ref 96–112)
CO2: 30 meq/L (ref 19–32)
Creatinine, Ser: 1.07 mg/dL (ref 0.40–1.20)
GFR: 53.64 mL/min — AB (ref >60.00)
Glucose, Bld: 123 mg/dL — ABNORMAL HIGH (ref 70–99)
Potassium: 4.2 mEq/L (ref 3.5–5.1)
Sodium: 145 mEq/L (ref 135–145)
Total Bilirubin: 0.6 mg/dL (ref 0.2–1.2)
Total Protein: 7.5 g/dL (ref 6.0–8.3)

## 2016-02-05 LAB — LIPID PANEL
CHOLESTEROL: 167 mg/dL (ref 0–200)
HDL: 61.4 mg/dL (ref >39.00)
LDL CALC: 88 mg/dL (ref 0–99)
NonHDL: 105.32
TRIGLYCERIDES: 88 mg/dL (ref 0.0–149.0)
Total CHOL/HDL Ratio: 3
VLDL: 17.6 mg/dL (ref 0.0–40.0)

## 2016-02-05 LAB — HEMOGLOBIN A1C: Hgb A1c MFr Bld: 6.2 % (ref 4.6–6.5)

## 2016-02-05 LAB — TSH: TSH: 2.43 u[IU]/mL (ref 0.35–4.50)

## 2016-02-05 MED ORDER — METOPROLOL TARTRATE 100 MG PO TABS: ORAL_TABLET | ORAL | 3 refills | Status: DC

## 2016-02-05 MED ORDER — HYDROCHLOROTHIAZIDE 25 MG PO TABS: 12.5000 mg | ORAL_TABLET | Freq: Every day | ORAL | 3 refills | Status: DC

## 2016-02-05 MED ORDER — SIMVASTATIN 40 MG PO TABS: ORAL_TABLET | ORAL | 3 refills | Status: DC

## 2016-02-05 NOTE — Progress Notes (Signed)
Pre visit review using our clinic review tool, if applicable. No additional management support is needed unless otherwise documented below in the visit note. 

## 2016-02-05 NOTE — Assessment & Plan Note (Signed)
DEXA up-to-date Repeat DEXA in 2 years Stressed regular walking Continue vitamin D, high calcium diet or calcium supplementation

## 2016-02-05 NOTE — Progress Notes (Signed)
Subjective:    Patient ID: Sharon Knight, female    DOB: 1946/01/21, 70 y.o.   MRN: [id number]  HPI She is here for a physical exam.   She denies any changes in her history since she was here last.  She has no concerns.   Medications and allergies reviewed with patient and updated if appropriate.  Patient Active Problem List   Diagnosis Date Noted  . Prediabetes 02/05/2016  . History of colonic polyps 10/22/2014  . 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 Vitamin D deficiency 10/31/2012  . ECZEMA 03/03/2010  . 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 Hyperlipidemia 08/02/2007  . Essential hypertension 08/02/2007  . Osteopenia 08/02/2007    Current Outpatient Prescriptions on File Prior to Visit  Medication Sig Dispense Refill  . aspirin 325 MG tablet Take 325 mg by mouth as needed.    Marland Kitchen aspirin 81 MG tablet Take 81 mg by mouth daily.    . Cholecalciferol (VITAMIN D-3) 1000 UNITS CAPS Take 1 capsule by mouth daily.    Mariane Baumgarten Calcium (STOOL SOFTENER PO) Take by mouth. Takes 2 to 3 tablets daily    . Multiple Vitamin (MULTIVITAMIN) tablet Take 1 tablet by mouth daily.     No current facility-administered medications on file prior to visit.     Past Medical History:  Diagnosis Date  . Arthritis   . Bilateral cataracts    Dr Kathrin Penner  . Hyperlipidemia   . Hypertension     Past Surgical History:  Procedure Laterality Date  . COLONOSCOPY     Dr Fuller Plan  . 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 OVARIAN CYST REMOVAL      Social History   Social History  . Marital status: Married    Spouse name: N/A  . Number of children: N/A  . Years of education: N/A   Social History Main Topics  . Smoking status: Former Smoker    Quit date: 08/27/1984  . Smokeless tobacco: Never Used     Comment: smoked age [age], up to 1.5-2 ppd  . Alcohol use 1.8 oz/week    3 Cans of beer per week     Comment: 3 x weekly  . Drug use: No  . Sexual activity: Not Asked     Other Topics Concern  . None   Social History Narrative   Exercise: none    Family History  Problem Relation Age of Onset  . Adopted: Yes  . Colon cancer Neg Hx   . Stomach cancer Neg Hx     Review of Systems  Constitutional: Negative for chills and fever.  HENT: Positive for hearing loss (mild in one ear).   Eyes: Negative for visual disturbance.  Respiratory: Negative for cough, shortness of breath and wheezing.   Cardiovascular: Positive for palpitations (occasional). Negative for chest pain and leg swelling.  Gastrointestinal: Negative for abdominal pain, blood in stool, constipation and diarrhea.       No gerd  Genitourinary: Negative for dysuria and hematuria.  Skin: Positive for color change (Halberg spots and a couple of red spots).  Neurological: Positive for dizziness (intermittent, none recent). Negative for light-headedness and headaches.  Psychiatric/Behavioral: Negative for dysphoric mood. The patient is not nervous/anxious.        Objective:   Vitals:   02/05/16 0829  BP: 134/86  Pulse: (!) 51  Resp: 16  Temp: 98 F (36.7 C)   Filed Weights   02/05/16  [id number]  Weight: 186 lb (84.4 kg)   Body mass index is 30.02 kg/m.   Physical Exam Constitutional: She appears well-developed and well-nourished. No distress.  HENT:  Head: Normocephalic and atraumatic.  Right Ear: External ear normal. Normal ear canal and TM Left Ear: External ear normal.  Normal ear canal and TM Mouth/Throat: Oropharynx is clear and moist.  Eyes: Conjunctivae and EOM are normal.  Neck: Neck supple. No tracheal deviation present. No thyromegaly present.  No carotid bruit  Cardiovascular: Normal rate, regular rhythm and normal heart sounds.   No murmur heard.  No edema. Pulmonary/Chest: Effort normal and breath sounds normal. No respiratory distress. She has no wheezes. She has no rales.  Breast: deferred to Gyn Abdominal: Soft. She exhibits no distension. There is no tenderness.   Lymphadenopathy: She has no cervical adenopathy.  Skin: Skin is warm and dry. She is not diaphoretic.  Psychiatric: She has a normal mood and affect. Her behavior is normal.       Assessment & Plan:   Physical exam: Screening blood work  ordered Immunizations   Flu vaccine today, others up to date Colonoscopy  Up to date  Mammogram   Up to date  Gyn  Up to date  Dexa    Up to date  Eye exams  Up to date  EKG - last 2016 - normal Exercise -none, but is active.  Encouraged regular exercise Weight - BMI in obese range - encouraged weight loss Skin - increased Rossini spots, some ares of concern -- will see derm Substance abuse  - none  See Problem List for Assessment and Plan of chronic medical problems.  Follow up annually

## 2016-02-05 NOTE — Patient Instructions (Addendum)
Test(s) ordered today. Your results will be released to [place] (or called to you) after review, usually within 72hours after test completion. If any changes need to be made, you will be notified at that same time.  All other Health Maintenance issues reviewed.   All recommended immunizations and age-appropriate screenings are up-to-date or discussed.  Flu vaccine administered today.   Medications reviewed and updated.  No changes recommended at this time.  Your prescription(s) have been submitted to your pharmacy. Please take as directed and contact our office if you believe you are having problem(s) with the medication(s).   Please followup in 6 months  You are obese.  Your Body mass index is 30.02 kg/m.  You should work on weight loss by decreasing your portions and increasing your exercise.   Health Maintenance, Female Adopting a healthy lifestyle and getting preventive care can go a long way to promote health and wellness. Talk with your health care provider about what schedule of regular examinations is right for you. This is a good chance for you to check in with your provider about disease prevention and staying healthy. In between checkups, there are plenty of things you can do on your own. Experts have done a lot of research about which lifestyle changes and preventive measures are most likely to keep you healthy. Ask your health care provider for more information. WEIGHT AND DIET  Eat a healthy diet  Be sure to include plenty of vegetables, fruits, low-fat dairy products, and lean protein.  Do not eat a lot of foods high in solid fats, added sugars, or salt.  Get regular exercise. This is one of the most important things you can do for your health.  Most adults should exercise for at least 150 minutes each week. The exercise should increase your heart rate and make you sweat (moderate-intensity exercise).  Most adults should also do strengthening exercises at least twice a  week. This is in addition to the moderate-intensity exercise.  Maintain a healthy weight  Body mass index (BMI) is a measurement that can be used to identify possible weight problems. It estimates body fat based on height and weight. Your health care provider can help determine your BMI and help you achieve or maintain a healthy weight.  For females 95 years of age and older:   A BMI below 18.5 is considered underweight.  A BMI of 18.5 to 24.9 is normal.  A BMI of 25 to 29.9 is considered overweight.  A BMI of 30 and above is considered obese.  Watch levels of cholesterol and blood lipids  You should start having your blood tested for lipids and cholesterol at 71 years of age, then have this test every 5 years.  You may need to have your cholesterol levels checked more often if:  Your lipid or cholesterol levels are high.  You are older than 71 years of age.  You are at high risk for heart disease.  CANCER SCREENING   Lung Cancer  Lung cancer screening is recommended for adults 49-74 years old who are at high risk for lung cancer because of a history of smoking.  A yearly low-dose CT scan of the lungs is recommended for people who:  Currently smoke.  Have quit within the past 15 years.  Have at least a 30-pack-year history of smoking. A pack year is smoking an average of one pack of cigarettes a day for 1 year.  Yearly screening should continue until it has been 15  years since you quit.  Yearly screening should stop if you develop a health problem that would prevent you from having lung cancer treatment.  Breast Cancer  Practice breast self-awareness. This means understanding how your breasts normally appear and feel.  It also means doing regular breast self-exams. Let your health care provider know about any changes, no matter how small.  If you are in your 20s or 30s, you should have a clinical breast exam (CBE) by a health care provider every 1-3 years as part  of a regular health exam.  If you are 28 or older, have a CBE every year. Also consider having a breast X-ray (mammogram) every year.  If you have a family history of breast cancer, talk to your health care provider about genetic screening.  If you are at high risk for breast cancer, talk to your health care provider about having an MRI and a mammogram every year.  Breast cancer gene (BRCA) assessment is recommended for women who have family members with BRCA-related cancers. BRCA-related cancers include:  Breast.  Ovarian.  Tubal.  Peritoneal cancers.  Results of the assessment will determine the need for genetic counseling and BRCA1 and BRCA2 testing. Cervical Cancer Your health care provider may recommend that you be screened regularly for cancer of the pelvic organs (ovaries, uterus, and vagina). This screening involves a pelvic examination, including checking for microscopic changes to the surface of your cervix (Pap test). You may be encouraged to have this screening done every 3 years, beginning at age 53.  For women ages 7-65, health care providers may recommend pelvic exams and Pap testing every 3 years, or they may recommend the Pap and pelvic exam, combined with testing for human papilloma virus (HPV), every 5 years. Some types of HPV increase your risk of cervical cancer. Testing for HPV may also be done on women of any age with unclear Pap test results.  Other health care providers may not recommend any screening for nonpregnant women who are considered low risk for pelvic cancer and who do not have symptoms. Ask your health care provider if a screening pelvic exam is right for you.  If you have had past treatment for cervical cancer or a condition that could lead to cancer, you need Pap tests and screening for cancer for at least 20 years after your treatment. If Pap tests have been discontinued, your risk factors (such as having a new sexual partner) need to be reassessed to  determine if screening should resume. Some women have medical problems that increase the chance of getting cervical cancer. In these cases, your health care provider may recommend more frequent screening and Pap tests. Colorectal Cancer  This type of cancer can be detected and often prevented.  Routine colorectal cancer screening usually begins at 71 years of age and continues through 71 years of age.  Your health care provider may recommend screening at an earlier age if you have risk factors for colon cancer.  Your health care provider may also recommend using home test kits to check for hidden blood in the stool.  A small camera at the end of a tube can be used to examine your colon directly (sigmoidoscopy or colonoscopy). This is done to check for the earliest forms of colorectal cancer.  Routine screening usually begins at age 75.  Direct examination of the colon should be repeated every 5-10 years through 71 years of age. However, you may need to be screened more often if early  forms of precancerous polyps or small growths are found. Skin Cancer  Check your skin from head to toe regularly.  Tell your health care provider about any new moles or changes in moles, especially if there is a change in a mole's shape or color.  Also tell your health care provider if you have a mole that is larger than the size of a pencil eraser.  Always use sunscreen. Apply sunscreen liberally and repeatedly throughout the day.  Protect yourself by wearing long sleeves, pants, a wide-brimmed hat, and sunglasses whenever you are outside. HEART DISEASE, DIABETES, AND HIGH BLOOD PRESSURE   High blood pressure causes heart disease and increases the risk of stroke. High blood pressure is more likely to develop in:  People who have blood pressure in the high end of the normal range (130-139/85-89 mm Hg).  People who are overweight or obese.  People who are African American.  If you are 18-39 years of  age, have your blood pressure checked every 3-5 years. If you are 46 years of age or older, have your blood pressure checked every year. You should have your blood pressure measured twice--once when you are at a hospital or clinic, and once when you are not at a hospital or clinic. Record the average of the two measurements. To check your blood pressure when you are not at a hospital or clinic, you can use:  An automated blood pressure machine at a pharmacy.  A home blood pressure monitor.  If you are between 41 years and 42 years old, ask your health care provider if you should take aspirin to prevent strokes.  Have regular diabetes screenings. This involves taking a blood sample to check your fasting blood sugar level.  If you are at a normal weight and have a low risk for diabetes, have this test once every three years after 71 years of age.  If you are overweight and have a high risk for diabetes, consider being tested at a younger age or more often. PREVENTING INFECTION  Hepatitis B  If you have a higher risk for hepatitis B, you should be screened for this virus. You are considered at high risk for hepatitis B if:  You were born in a country where hepatitis B is common. Ask your health care provider which countries are considered high risk.  Your parents were born in a high-risk country, and you have not been immunized against hepatitis B (hepatitis B vaccine).  You have HIV or AIDS.  You use needles to inject street drugs.  You live with someone who has hepatitis B.  You have had sex with someone who has hepatitis B.  You get hemodialysis treatment.  You take certain medicines for conditions, including cancer, organ transplantation, and autoimmune conditions. Hepatitis C  Blood testing is recommended for:  Everyone born from [birth year] through 1966.  Anyone with known risk factors for hepatitis C. Sexually transmitted infections (STIs)  You should be screened for sexually  transmitted infections (STIs) including gonorrhea and chlamydia if:  You are sexually active and are younger than 71 years of age.  You are older than 71 years of age and your health care provider tells you that you are at risk for this type of infection.  Your sexual activity has changed since you were last screened and you are at an increased risk for chlamydia or gonorrhea. Ask your health care provider if you are at risk.  If you do not have HIV, but are  at risk, it may be recommended that you take a prescription medicine daily to prevent HIV infection. This is called pre-exposure prophylaxis (PrEP). You are considered at risk if:  You are sexually active and do not regularly use condoms or know the HIV status of your partner(s).  You take drugs by injection.  You are sexually active with a partner who has HIV. Talk with your health care provider about whether you are at high risk of being infected with HIV. If you choose to begin PrEP, you should first be tested for HIV. You should then be tested every 3 months for as long as you are taking PrEP.  PREGNANCY   If you are premenopausal and you may become pregnant, ask your health care provider about preconception counseling.  If you may become pregnant, take 400 to 800 micrograms (mcg) of folic acid every day.  If you want to prevent pregnancy, talk to your health care provider about birth control (contraception). OSTEOPOROSIS AND MENOPAUSE   Osteoporosis is a disease in which the bones lose minerals and strength with aging. This can result in serious bone fractures. Your risk for osteoporosis can be identified using a bone density scan.  If you are 22 years of age or older, or if you are at risk for osteoporosis and fractures, ask your health care provider if you should be screened.  Ask your health care provider whether you should take a calcium or vitamin D supplement to lower your risk for osteoporosis.  Menopause may have  certain physical symptoms and risks.  Hormone replacement therapy may reduce some of these symptoms and risks. Talk to your health care provider about whether hormone replacement therapy is right for you.  HOME CARE INSTRUCTIONS   Schedule regular health, dental, and eye exams.  Stay current with your immunizations.   Do not use any tobacco products including cigarettes, chewing tobacco, or electronic cigarettes.  If you are pregnant, do not drink alcohol.  If you are breastfeeding, limit how much and how often you drink alcohol.  Limit alcohol intake to no more than 1 drink per day for nonpregnant women. One drink equals 12 ounces of beer, 5 ounces of wine, or 1 ounces of hard liquor.  Do not use street drugs.  Do not share needles.  Ask your health care provider for help if you need support or information about quitting drugs.  Tell your health care provider if you often feel depressed.  Tell your health care provider if you have ever been abused or do not feel safe at home.   This information is not intended to replace advice given to you by your health care provider. Make sure you discuss any questions you have with your health care provider.   Document Released: 11/03/2010 Document Revised: 05/11/2014 Document Reviewed: 03/22/2013 Elsevier Interactive Patient Education [employer].

## 2016-02-05 NOTE — Assessment & Plan Note (Addendum)
Check a1c Stressed low sugar/carbohydrate diet, regular exercise and keeping her weight down

## 2016-02-05 NOTE — Assessment & Plan Note (Signed)
Check lipid panel.  Continue statin. 

## 2016-02-05 NOTE — Assessment & Plan Note (Signed)
BP well controlled Current regimen effective and well tolerated Continue current medications at current doses cmp  

## 2016-02-05 NOTE — Assessment & Plan Note (Signed)
Taking vitamin d 

## 2016-02-06 LAB — HEPATITIS C ANTIBODY: HCV AB: NEGATIVE

## 2016-05-26 ENCOUNTER — Other Ambulatory Visit: Payer: Self-pay | Admitting: Dermatology

## 2016-05-26 DIAGNOSIS — C44319 Basal cell carcinoma of skin of other parts of face: Secondary | ICD-10-CM | POA: Diagnosis not present

## 2016-05-26 DIAGNOSIS — L821 Other seborrheic keratosis: Secondary | ICD-10-CM | POA: Diagnosis not present

## 2016-05-26 DIAGNOSIS — L570 Actinic keratosis: Secondary | ICD-10-CM | POA: Diagnosis not present

## 2016-05-26 DIAGNOSIS — C44311 Basal cell carcinoma of skin of nose: Secondary | ICD-10-CM | POA: Diagnosis not present

## 2016-07-07 ENCOUNTER — Other Ambulatory Visit: Payer: Self-pay | Admitting: Obstetrics and Gynecology

## 2016-07-07 DIAGNOSIS — N83291 Other ovarian cyst, right side: Secondary | ICD-10-CM | POA: Diagnosis not present

## 2016-07-07 DIAGNOSIS — Z124 Encounter for screening for malignant neoplasm of cervix: Secondary | ICD-10-CM | POA: Diagnosis not present

## 2016-07-09 LAB — CYTOLOGY - PAP

## 2016-07-15 DIAGNOSIS — L905 Scar conditions and fibrosis of skin: Secondary | ICD-10-CM | POA: Diagnosis not present

## 2016-07-15 DIAGNOSIS — Z85828 Personal history of other malignant neoplasm of skin: Secondary | ICD-10-CM | POA: Diagnosis not present

## 2016-08-03 ENCOUNTER — Other Ambulatory Visit: Payer: Medicare Other

## 2016-08-03 ENCOUNTER — Other Ambulatory Visit (INDEPENDENT_AMBULATORY_CARE_PROVIDER_SITE_OTHER): Payer: Medicare Other

## 2016-08-03 ENCOUNTER — Encounter: Payer: Self-pay | Admitting: Internal Medicine

## 2016-08-03 ENCOUNTER — Ambulatory Visit (INDEPENDENT_AMBULATORY_CARE_PROVIDER_SITE_OTHER): Payer: Medicare Other | Admitting: Internal Medicine

## 2016-08-03 VITALS — BP 130/84 | HR 62 | Temp 97.1°F | Resp 16 | Wt 180.0 lb

## 2016-08-03 DIAGNOSIS — R197 Diarrhea, unspecified: Secondary | ICD-10-CM | POA: Diagnosis not present

## 2016-08-03 LAB — CBC WITH DIFFERENTIAL/PLATELET
Basophils Absolute: 0.1 10*3/uL (ref 0.0–0.1)
Basophils Relative: 0.7 % (ref 0.0–3.0)
EOS ABS: 0.2 10*3/uL (ref 0.0–0.7)
Eosinophils Relative: 2.8 % (ref 0.0–5.0)
HEMATOCRIT: 44.2 % (ref 36.0–46.0)
HEMOGLOBIN: 15.2 g/dL — AB (ref 12.0–15.0)
LYMPHS PCT: 15.9 % (ref 12.0–46.0)
Lymphs Abs: 1.1 10*3/uL (ref 0.7–4.0)
MCHC: 34.3 g/dL (ref 30.0–36.0)
MCV: 90.4 fl (ref 78.0–100.0)
Monocytes Absolute: 0.9 10*3/uL (ref 0.1–1.0)
Monocytes Relative: 13.2 % — ABNORMAL HIGH (ref 3.0–12.0)
Neutro Abs: 4.8 10*3/uL (ref 1.4–7.7)
Neutrophils Relative %: 67.4 % (ref 43.0–77.0)
Platelets: 265 10*3/uL (ref 150.0–400.0)
RBC: 4.89 Mil/uL (ref 3.87–5.11)
RDW: 13.1 % (ref 11.5–15.5)
WBC: 7.2 10*3/uL (ref 4.0–10.5)

## 2016-08-03 LAB — COMPREHENSIVE METABOLIC PANEL
ALBUMIN: 4.3 g/dL (ref 3.5–5.2)
ALT: 84 U/L — ABNORMAL HIGH (ref 0–35)
AST: 71 U/L — AB (ref 0–37)
Alkaline Phosphatase: 63 U/L (ref 39–117)
BUN: 13 mg/dL (ref 6–23)
CALCIUM: 9.6 mg/dL (ref 8.4–10.5)
CHLORIDE: 106 meq/L (ref 96–112)
CO2: 28 mEq/L (ref 19–32)
CREATININE: 1.01 mg/dL (ref 0.40–1.20)
GFR: 57.25 mL/min — ABNORMAL LOW (ref >60.00)
Glucose, Bld: 115 mg/dL — ABNORMAL HIGH (ref 70–99)
POTASSIUM: 4.5 meq/L (ref 3.5–5.1)
Sodium: 139 mEq/L (ref 135–145)
Total Bilirubin: 0.6 mg/dL (ref 0.2–1.2)
Total Protein: 7.6 g/dL (ref 6.0–8.3)

## 2016-08-03 NOTE — Progress Notes (Signed)
Subjective:    Patient ID: Sharon Knight, female    DOB: August 27, 1945, 71 y.o.   MRN: [id number]  HPI She is here for an acute visit.  Diarrhea:  She started having diarrhea last Wednesday, 5 days ago.  She has had yellow-Ignatowski liquid stool.  She denies blood in the stool.  Yesterday she took one imodium and she was able to sleep through the night.  She had three watery stools this morning.  She had some hemorrhoidal bleeding last week.    She has a decreased appetite, nausea once.  She denies fever, chills,vomiting and abdominal pain.     She denies travel in the past 6 months.  She denies antibiotics in the past three months.  She has been going to a nursing home to visit her mother in law, but she has not had any diarrhea.  She ate tacos the day before the diarrhea started.  No one else ate the tacos.  She denies changes in her medications/supplements.    She is drinking plenty of fluids.  She has not eaten much.   Medications and allergies reviewed with patient and updated if appropriate.  Patient Active Problem List   Diagnosis Date Noted  . Prediabetes 02/05/2016  . History of colonic polyps 10/22/2014  . 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 Vitamin D deficiency 10/31/2012  . ECZEMA 03/03/2010  . 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 Hyperlipidemia 08/02/2007  . Essential hypertension 08/02/2007  . Osteopenia 08/02/2007    Current Outpatient Prescriptions on File Prior to Visit  Medication Sig Dispense Refill  . aspirin 325 MG tablet Take 325 mg by mouth as needed.    Marland Kitchen aspirin 81 MG tablet Take 81 mg by mouth daily.    . Cholecalciferol (VITAMIN D-3) 1000 UNITS CAPS Take 1 capsule by mouth daily.    Mariane Baumgarten Calcium (STOOL SOFTENER PO) Take by mouth. Takes 2 to 3 tablets daily    . hydrochlorothiazide (HYDRODIURIL) 25 MG tablet Take 0.5 tablets (12.5 mg total) by mouth daily. 45 tablet 3  . metoprolol (LOPRESSOR) 100 MG tablet Take 1/2 tablet by mouth twice  daily 90 tablet 3  . Multiple Vitamin (MULTIVITAMIN) tablet Take 1 tablet by mouth daily.    . simvastatin (ZOCOR) 40 MG tablet TAKE 1 TABLET EACH DAY. 90 tablet 3   No current facility-administered medications on file prior to visit.     Past Medical History:  Diagnosis Date  . Arthritis   . Bilateral cataracts    Dr Kathrin Penner  . Hyperlipidemia   . Hypertension     Past Surgical History:  Procedure Laterality Date  . COLONOSCOPY     Dr Fuller Plan  . 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 OVARIAN CYST REMOVAL      Social History   Social History  . Marital status: Married    Spouse name: N/A  . Number of children: N/A  . Years of education: N/A   Social History Main Topics  . Smoking status: Former Smoker    Quit date: 08/27/1984  . Smokeless tobacco: Never Used     Comment: smoked age 41-40, up to 1.5-2 ppd  . Alcohol use 1.8 oz/week    3 Cans of beer per week     Comment: 3 x weekly  . Drug use: No  . Sexual activity: Not on file   Other Topics Concern  . Not on file   Social History  Narrative   Exercise: none    Family History  Problem Relation Age of Onset  . Adopted: Yes  . Colon cancer Neg Hx   . Stomach cancer Neg Hx     Review of Systems  Constitutional: Positive for appetite change (decreased). Negative for chills and fever.  Respiratory: Negative for shortness of breath.   Cardiovascular: Negative for chest pain and palpitations.  Gastrointestinal: Positive for abdominal distention (increased gas), anal bleeding, diarrhea and nausea. Negative for abdominal pain, blood in stool and vomiting.  Genitourinary: Negative for dysuria and hematuria.  Neurological: Negative for light-headedness and headaches.       Objective:   Vitals:   08/03/16 1130  BP: 130/84  Pulse: 62  Resp: 16  Temp: 97.1 F (36.2 C)   Filed Weights   08/03/16 1130  Weight: 180 lb (81.6 kg)   Body mass index is 29.05 kg/m.  Wt Readings from Last 3  Encounters:  08/03/16 180 lb (81.6 kg)  02/05/16 186 lb (84.4 kg)  02/12/15 186 lb (84.4 kg)     Physical Exam  Constitutional: She appears well-developed and well-nourished. No distress.  Abdominal: Soft. She exhibits no distension and no mass. There is no tenderness. There is no rebound and no guarding.  Increased bowel movements  Musculoskeletal: She exhibits no edema.  Skin: Skin is warm and dry. She is not diaphoretic.        Assessment & Plan:   See Problem List for Assessment and Plan of chronic medical problems.

## 2016-08-03 NOTE — Progress Notes (Signed)
Pre visit review using our clinic review tool, if applicable. No additional management support is needed unless otherwise documented below in the visit note. 

## 2016-08-03 NOTE — Assessment & Plan Note (Addendum)
No obvious cause - no antibiotics, travel, ? Related to tacos Will check cbc, cmp Check stool studies Continue increased fluids Bland diet No anti-nausea medication needed  Imodium if needed but will try to avoid Call if no improvement

## 2016-08-03 NOTE — Patient Instructions (Addendum)
Blood work and stools studies ordered.   Your results will be released to [place] (or called to you) after review, usually within 72hours after test completion. If any changes need to be made, you will be notified at that same time.    Bland Diet A bland diet consists of foods that do not have a lot of fat or fiber. Foods without fat or fiber are easier for the body to digest. They are also less likely to irritate your mouth, throat, stomach, and other parts of your gastrointestinal tract. A bland diet is sometimes called a BRAT diet. What is my plan? Your health care provider or dietitian may recommend specific changes to your diet to prevent and treat your symptoms, such as:  Eating small meals often.  Cooking food until it is soft enough to chew easily.  Chewing your food well.  Drinking fluids slowly.  Not eating foods that are very spicy, sour, or fatty.  Not eating citrus fruits, such as oranges and grapefruit. What do I need to know about this diet?  Eat a variety of foods from the bland diet food list.  Do not follow a bland diet longer than you have to.  Ask your health care provider whether you should take vitamins. What foods can I eat? Grains   Hot cereals, such as cream of wheat. Bread, crackers, or tortillas made from refined white flour. Rice. Vegetables  Canned or cooked vegetables. Mashed or boiled potatoes. Fruits  Bananas. Applesauce. Other types of cooked or canned fruit with the skin and seeds removed, such as canned peaches or pears. Meats and Other Protein Sources  Scrambled eggs. Creamy peanut butter or other nut butters. Lean, well-cooked meats, such as chicken or fish. Tofu. Soups or broths. Dairy  Low-fat dairy products, such as milk, cottage cheese, or yogurt. Beverages  Water. Herbal tea. Apple juice. Sweets and Desserts  Pudding. Custard. Fruit gelatin. Ice cream. Fats and Oils  Mild salad dressings. Canola or olive oil. The items listed  above may not be a complete list of allowed foods or beverages. Contact your dietitian for more options.  What foods are not recommended? Foods and ingredients that are often not recommended include:  Spicy foods, such as hot sauce or salsa.  Fried foods.  Sour foods, such as pickled or fermented foods.  Raw vegetables or fruits, especially citrus or berries.  Caffeinated drinks.  Alcohol.  Strongly flavored seasonings or condiments. The items listed above may not be a complete list of foods and beverages that are not allowed. Contact your dietitian for more information.  This information is not intended to replace advice given to you by your health care provider. Make sure you discuss any questions you have with your health care provider. Document Released: 08/12/2015 Document Revised: 09/26/2015 Document Reviewed: 05/02/2014 Elsevier Interactive Patient Education  2017 Else[employer] Inc[employer]    Diarrhea, Adult Diarrhea is frequent loose and watery bowel movements. Diarrhea can make you feel weak and cause you to become dehydrated. Dehydration can make you tired and thirsty, cause you to have a dry mouth, and decrease how often you urinate. Diarrhea typically lasts 2-3 days. However, it can last longer if it is a sign of something more serious. It is important to treat your diarrhea as told by your health care provider. Follow these instructions at home: Eating and drinking   Follow these recommendations as told by your health care provider:  Take an oral rehydration solution (ORS). This is a drink  that is sold at pharmacies and retail stores.  Drink clear fluids, such as water, ice chips, diluted fruit juice, and low-calorie sports drinks.  Eat bland, easy-to-digest foods in small amounts as you are able. These foods include bananas, applesauce, rice, lean meats, toast, and crackers.  Avoid drinking fluids that contain a lot of sugar or caffeine, such as energy drinks, sports drinks,  and soda.  Avoid alcohol.  Avoid spicy or fatty foods. General instructions   Drink enough fluid to keep your urine clear or pale yellow.  Wash your hands often. If soap and water are not available, use hand sanitizer.  Make sure that all people in your household wash their hands well and often.  Take over-the-counter and prescription medicines only as told by your health care provider.  Rest at home while you recover.  Watch your condition for any changes.  Take a warm bath to relieve any burning or pain from frequent diarrhea episodes.  Keep all follow-up visits as told by your health care provider. This is important. Contact a health care provider if:  You have a fever.  Your diarrhea gets worse.  You have new symptoms.  You cannot keep fluids down.  You feel light-headed or dizzy.  You have a headache  You have muscle cramps. Get help right away if:  You have chest pain.  You feel extremely weak or you faint.  You have bloody or black stools or stools that look like tar.  You have severe pain, cramping, or bloating in your abdomen.  You have trouble breathing or you are breathing very quickly.  Your heart is beating very quickly.  Your skin feels cold and clammy.  You feel confused.  You have signs of dehydration, such as:  Dark urine, very little urine, or no urine.  Cracked lips.  Dry mouth.  Sunken eyes.  Sleepiness.  Weakness. This information is not intended to replace advice given to you by your health care provider. Make sure you discuss any questions you have with your health care provider. Document Released: 04/10/2002 Document Revised: 08/29/2015 Document Reviewed: 12/25/2014 Elsevier Interactive Patient Education  2017 [employer].

## 2016-08-05 ENCOUNTER — Ambulatory Visit: Payer: Medicare Other | Admitting: Internal Medicine

## 2016-08-12 DIAGNOSIS — N83201 Unspecified ovarian cyst, right side: Secondary | ICD-10-CM | POA: Diagnosis not present

## 2016-10-14 DIAGNOSIS — Z85828 Personal history of other malignant neoplasm of skin: Secondary | ICD-10-CM | POA: Diagnosis not present

## 2016-11-25 DIAGNOSIS — Z961 Presence of intraocular lens: Secondary | ICD-10-CM | POA: Diagnosis not present

## 2016-11-25 DIAGNOSIS — H02054 Trichiasis without entropian left upper eyelid: Secondary | ICD-10-CM | POA: Diagnosis not present

## 2016-11-25 DIAGNOSIS — E119 Type 2 diabetes mellitus without complications: Secondary | ICD-10-CM | POA: Diagnosis not present

## 2016-11-25 DIAGNOSIS — H16102 Unspecified superficial keratitis, left eye: Secondary | ICD-10-CM | POA: Diagnosis not present

## 2016-11-25 LAB — HM DIABETES EYE EXAM

## 2016-12-01 ENCOUNTER — Other Ambulatory Visit: Payer: Self-pay | Admitting: Obstetrics and Gynecology

## 2016-12-01 DIAGNOSIS — Z1231 Encounter for screening mammogram for malignant neoplasm of breast: Secondary | ICD-10-CM

## 2016-12-15 ENCOUNTER — Encounter: Payer: Self-pay | Admitting: Internal Medicine

## 2017-01-27 ENCOUNTER — Ambulatory Visit
Admission: RE | Admit: 2017-01-27 | Discharge: 2017-01-27 | Disposition: A | Discharge status: Home / Self Care | Payer: Medicare Other | Source: Ambulatory Visit | Attending: Obstetrics and Gynecology | Admitting: Obstetrics and Gynecology

## 2017-01-27 DIAGNOSIS — Z1231 Encounter for screening mammogram for malignant neoplasm of breast: Secondary | ICD-10-CM

## 2017-02-03 ENCOUNTER — Ambulatory Visit (INDEPENDENT_AMBULATORY_CARE_PROVIDER_SITE_OTHER): Payer: Medicare Other

## 2017-02-03 DIAGNOSIS — Z23 Encounter for immunization: Secondary | ICD-10-CM

## 2017-02-05 ENCOUNTER — Other Ambulatory Visit: Payer: Self-pay | Admitting: Internal Medicine

## 2017-02-18 NOTE — Progress Notes (Signed)
Subjective:    Patient ID: Sharon Knight, female    DOB: 04/03/1946, 71 y.o.   MRN: [id number]  HPI The patient is here for follow up.  Hypertension: She is taking her medication daily. She is compliant with a low sodium diet.  She denies chest pain, palpitations, edema, shortness of breath and regular headaches. She is not exercising regularly.  She does not monitor her blood pressure at home.    Hyperlipidemia: She is taking her medication daily. She is compliant with a low fat/cholesterol diet. She is not exercising regularly. She denies myalgias.   Prediabetes:  She is fairly compliant with a low sugar/carbohydrate diet.  She is not  exercising regularly.   Medications and allergies reviewed with patient and updated if appropriate.  Patient Active Problem List   Diagnosis Date Noted  . Diarrhea 08/03/2016  . Prediabetes 02/05/2016  . History of colonic polyps 10/22/2014  . 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 Vitamin D deficiency 10/31/2012  . ECZEMA 03/03/2010  . 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 Hyperlipidemia 08/02/2007  . Essential hypertension 08/02/2007  . Osteopenia 08/02/2007    Current Outpatient Prescriptions on File Prior to Visit  Medication Sig Dispense Refill  . aspirin 325 MG tablet Take 325 mg by mouth as needed.    Marland Kitchen aspirin 81 MG tablet Take 81 mg by mouth daily.    . Cholecalciferol (VITAMIN D-3) 1000 UNITS CAPS Take 1 capsule by mouth daily.    Mariane Baumgarten Calcium (STOOL SOFTENER PO) Take by mouth. Takes 2 to 3 tablets daily    . hydrochlorothiazide (HYDRODIURIL) 25 MG tablet Take 0.5 tablets (12.5 mg total) by mouth daily. --- Office visit needed for further refills 15 tablet 0  . metoprolol (LOPRESSOR) 100 MG tablet Take 1/2 tablet by mouth twice daily 90 tablet 3  . Multiple Vitamin (MULTIVITAMIN) tablet Take 1 tablet by mouth daily.    . simvastatin (ZOCOR) 40 MG tablet TAKE 1 TABLET EACH DAY. 90 tablet 3   No current  facility-administered medications on file prior to visit.     Past Medical History:  Diagnosis Date  . Arthritis   . Bilateral cataracts    Dr Kathrin Penner  . Hyperlipidemia   . Hypertension     Past Surgical History:  Procedure Laterality Date  . COLONOSCOPY     Dr Fuller Plan  . 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 OVARIAN CYST REMOVAL      Social History   Social History  . Marital status: Married    Spouse name: N/A  . Number of children: N/A  . Years of education: N/A   Social History Main Topics  . Smoking status: Former Smoker    Quit date: 08/27/1984  . Smokeless tobacco: Never Used     Comment: smoked age [age], up to 1.5-2 ppd  . Alcohol use 1.8 oz/week    3 Cans of beer per week     Comment: 3 x weekly  . Drug use: No  . Sexual activity: Not Asked   Other Topics Concern  . None   Social History Narrative   Exercise: none    Family History  Problem Relation Age of Onset  . Adopted: Yes  . Colon cancer Neg Hx   . Stomach cancer Neg Hx     Review of Systems  Constitutional: Negative for chills and fever.  HENT: Positive for postnasal drip.   Respiratory: Positive for cough (  related to PND). Negative for shortness of breath and wheezing.   Cardiovascular: Negative for chest pain, palpitations and leg swelling.  Gastrointestinal:       No gerd  Neurological: Positive for dizziness (intermittent). Negative for light-headedness and headaches.       Objective:   Vitals:   02/19/17 0947  BP: (!) 144/84  Pulse: (!) 55  Resp: 16  Temp: 98.1 F (36.7 C)  SpO2: 98%   Wt Readings from Last 3 Encounters:  02/19/17 177 lb (80.3 kg)  08/03/16 180 lb (81.6 kg)  02/05/16 186 lb (84.4 kg)   Body mass index is 28.57 kg/m.   Physical Exam    Constitutional: Appears well-developed and well-nourished. No distress.  HENT:  Head: Normocephalic and atraumatic.  Neck: Neck supple. No tracheal deviation present. No thyromegaly present.  No  cervical lymphadenopathy Cardiovascular: Normal rate, regular rhythm and normal heart sounds.   No murmur heard. No carotid bruit .  No edema Pulmonary/Chest: Effort normal and breath sounds normal. No respiratory distress. No has no wheezes. No rales.  Skin: Skin is warm and dry. Not diaphoretic.  Psychiatric: Normal mood and affect. Behavior is normal.      Assessment & Plan:    See Problem List for Assessment and Plan of chronic medical problems.

## 2017-02-19 ENCOUNTER — Ambulatory Visit (INDEPENDENT_AMBULATORY_CARE_PROVIDER_SITE_OTHER): Payer: Medicare Other | Admitting: Internal Medicine

## 2017-02-19 ENCOUNTER — Encounter: Payer: Self-pay | Admitting: Internal Medicine

## 2017-02-19 ENCOUNTER — Other Ambulatory Visit (INDEPENDENT_AMBULATORY_CARE_PROVIDER_SITE_OTHER): Payer: Medicare Other

## 2017-02-19 VITALS — BP 144/84 | HR 55 | Temp 98.1°F | Resp 16 | Ht 66.0 in | Wt 177.0 lb

## 2017-02-19 DIAGNOSIS — R7303 Prediabetes: Secondary | ICD-10-CM | POA: Diagnosis not present

## 2017-02-19 DIAGNOSIS — I10 Essential (primary) hypertension: Secondary | ICD-10-CM

## 2017-02-19 DIAGNOSIS — Z0000 Encounter for general adult medical examination without abnormal findings: Secondary | ICD-10-CM | POA: Diagnosis not present

## 2017-02-19 DIAGNOSIS — E7800 Pure hypercholesterolemia, unspecified: Secondary | ICD-10-CM | POA: Diagnosis not present

## 2017-02-19 DIAGNOSIS — Z23 Encounter for immunization: Secondary | ICD-10-CM

## 2017-02-19 LAB — LIPID PANEL
CHOL/HDL RATIO: 3
Cholesterol: 150 mg/dL (ref 0–200)
HDL: 53.3 mg/dL (ref >39.00)
LDL Cholesterol: 80 mg/dL (ref 0–99)
NonHDL: 96.39
TRIGLYCERIDES: 84 mg/dL (ref 0.0–149.0)
VLDL: 16.8 mg/dL (ref 0.0–40.0)

## 2017-02-19 LAB — COMPREHENSIVE METABOLIC PANEL
ALK PHOS: 54 U/L (ref 39–117)
ALT: 25 U/L (ref 0–35)
AST: 26 U/L (ref 0–37)
Albumin: 4.3 g/dL (ref 3.5–5.2)
BUN: 14 mg/dL (ref 6–23)
CO2: 31 mEq/L (ref 19–32)
Calcium: 9.9 mg/dL (ref 8.4–10.5)
Chloride: 101 mEq/L (ref 96–112)
Creatinine, Ser: 1.06 mg/dL (ref 0.40–1.20)
GFR: 54.07 mL/min — AB (ref >60.00)
GLUCOSE: 114 mg/dL — AB (ref 70–99)
POTASSIUM: 3.8 meq/L (ref 3.5–5.1)
Sodium: 140 mEq/L (ref 135–145)
TOTAL PROTEIN: 7.4 g/dL (ref 6.0–8.3)
Total Bilirubin: 0.7 mg/dL (ref 0.2–1.2)

## 2017-02-19 LAB — HEMOGLOBIN A1C: HEMOGLOBIN A1C: 6.4 % (ref 4.6–6.5)

## 2017-02-19 NOTE — Progress Notes (Signed)
Pre visit review using our clinic review tool, if applicable. No additional management support is needed unless otherwise documented below in the visit note. 

## 2017-02-19 NOTE — Assessment & Plan Note (Signed)
Check a1c Low sugar / carb diet Stressed regular exercise, weight loss  

## 2017-02-19 NOTE — Assessment & Plan Note (Signed)
Check lipid panel  Continue daily statin Regular exercise and healthy diet encouraged  

## 2017-02-19 NOTE — Progress Notes (Signed)
Subjective:   Sharon Knight is a 71 y.o. female who presents for Medicare Annual (Subsequent) preventive examination.  Review of Systems:  No ROS.  Medicare Wellness Visit. Additional risk factors are reflected in the social history.  Cardiac Risk Factors include: advanced age (>23men, >30 women);hypertension Sleep patterns: gets up 1-2 times nightly to void and sleeps 6-7 hours nightly. Patient reports insomnia issues, discussed recommended sleep tips and stress reduction tips.   Home Safety/Smoke Alarms: Feels safe in home. Smoke alarms in place.  Living environment; residence and Firearm Safety: 1-story house/ trailer, no firearms. Lives with husband, no needs for DME, limited support system Seat Belt Safety/Bike Helmet: Wears seat belt.    Objective:     Vitals: BP (!) 144/84   Pulse (!) 55   Temp 98.1 F (36.7 C) (Oral)   Resp 16   Ht 5\' 6"  (1.676 m)   Wt 177 lb (80.3 kg)   SpO2 98%   BMI 28.57 kg/m   Body mass index is 28.57 kg/m.   Tobacco History  Smoking Status  . Former Smoker  . Quit date: 08/27/1984  Smokeless Tobacco  . Never Used    Comment: smoked age [age], up to 1.5-2 ppd     Counseling given: Not Answered   Past Medical History:  Diagnosis Date  . Arthritis   . Bilateral cataracts    Dr Kathrin Penner  . Hyperlipidemia   . Hypertension    Past Surgical History:  Procedure Laterality Date  . COLONOSCOPY     Dr Fuller Plan  . 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 OVARIAN CYST REMOVAL     Family History  Problem Relation Age of Onset  . Adopted: Yes  . Colon cancer Neg Hx   . Stomach cancer Neg Hx    History  Sexual Activity  . Sexual activity: Not on file    Outpatient Encounter Prescriptions as of 02/19/2017  Medication Sig  . aspirin 325 MG tablet Take 325 mg by mouth as needed.  Marland Kitchen aspirin 81 MG tablet Take 81 mg by mouth daily.  . Cholecalciferol (VITAMIN D-3) 1000 UNITS CAPS Take 1 capsule by mouth daily.  Mariane Baumgarten  Calcium (STOOL SOFTENER PO) Take by mouth. Takes 2 to 3 tablets daily  . hydrochlorothiazide (HYDRODIURIL) 25 MG tablet Take 0.5 tablets (12.5 mg total) by mouth daily. --- Office visit needed for further refills  . metoprolol (LOPRESSOR) 100 MG tablet Take 1/2 tablet by mouth twice daily  . Multiple Vitamin (MULTIVITAMIN) tablet Take 1 tablet by mouth daily.  . simvastatin (ZOCOR) 40 MG tablet TAKE 1 TABLET EACH DAY.   No facility-administered encounter medications on file as of 02/19/2017.     Activities of Daily Living In your present state of health, do you have any difficulty performing the following activities: 02/19/2017  Hearing? Y  Vision? N  Difficulty concentrating or making decisions? N  Walking or climbing stairs? N  Dressing or bathing? N  Doing errands, shopping? N  Preparing Food and eating ? N  Using the Toilet? N  In the past six months, have you accidently leaked urine? N  Do you have problems with loss of bowel control? N  Managing your Medications? N  Managing your Finances? N  Housekeeping or managing your Housekeeping? N  Some recent data might be hidden    Patient Care Team: Binnie Rail, MD as PCP - General (Internal Medicine)    Assessment:  Physical assessment deferred to PCP.  Exercise Activities and Dietary recommendations Current Exercise Habits: The patient does not participate in regular exercise at present, Exercise limited by: None identified  Diet (meal preparation, eat out, water intake, caffeinated beverages, dairy products, fruits and vegetables): in general, a "healthy" diet     Reviewed heart healthy and diabetic diet, encouraged patient to increase daily water intake. Discussed reading food labels and portion control.  Goals      Patient Stated   . patient to consider (pt-stated)          Will put some thought into what she can do; Stressors evaluated;  Gave information for Alz as[place] Alzheimer's Association / Family  information and training  Get help & support [place]  Message boards  Support groups  Education programs  [address], [address] [place], [place] [address] P: [phone] F: [phone]  Families to call the 800 number to get information on all the resources in the area 24 hour 1 [phone]  Can provide resources; Questions about Dementia; Support groups; Give the caregiver information regarding respite ([facility]) Call the [place] on Aging;as they oversee who gets the grant fund for certain areas ([phone]) Also have Tools to help navigate the needs of the patient  Also gave information on [place] Response program        Other   . I want to start walking again          I will begin by trying to walk 1-2 times a week. Perhaps start to go to [facility] or [place]  02/19/2017 02/19/2017 08/03/2016 02/11/2015 10/31/2012  Falls in the past year? No No No No No   Depression Screen PHQ 2/9 Scores 02/19/2017 02/19/2017 08/03/2016 02/11/2015  PHQ - 2 Score 1 0 0 0  PHQ- 9 Score 4 - - -     Cognitive Function MMSE - Mini Mental State Exam 02/19/2017  Orientation to time 5  Orientation to Place 5  Registration 3  Attention/ Calculation 4  Recall 2  Language- name 2 objects 2  Language- repeat 1  Language- follow 3 step command 3  Language- read & follow direction 1  Write a sentence 1  Copy design 1  Total score 28        Immunization History  Administered Date(s) Administered  . Influenza Split 02/18/2011, 02/18/2012  . Influenza Whole 03/02/2007, 02/03/2008, 02/08/2009, 02/25/2010  . Influenza, High Dose Seasonal PF 02/14/2013, 02/05/2016, 02/03/2017  . Influenza,inj,Quad PF,6+ Mos 02/20/2014, 02/11/2015  . Influenza-Unspecified 02/02/2016  . Pneumococcal Conjugate-13 02/12/2015  . Pneumococcal Polysaccharide-23 02/19/2017  . Td 03/03/2010  . Zoster 11/02/2012     Screening Tests Health Maintenance  Topic Date Due  . PNA vac Low Risk Adult (2 of 2 - PPSV23) 02/12/2016  . DEXA SCAN  07/02/2017  . MAMMOGRAM  01/28/2019  . TETANUS/TDAP  03/03/2020  . COLONOSCOPY  09/10/2021  . INFLUENZA VACCINE  Completed  . Hepatitis C Screening  Completed      Plan:    Continue doing brain stimulating activities (puzzles, reading, adult coloring books, staying active) to keep memory sharp.   Continue to eat heart healthy diet (full of fruits, vegetables, whole grains, lean protein, water--limit salt, fat, and sugar intake) and increase physical activity as tolerated.  I have personally reviewed and noted the following in the patient's chart:   . Medical and  social history . Use of alcohol, tobacco or illicit drugs  . Current medications and supplements . Functional ability and status . Nutritional status . Physical activity . Advanced directives . List of other physicians . Vitals . Screenings to include cognitive, depression, and falls . Referrals and appointments  In addition, I have reviewed and discussed with patient certain preventive protocols, quality metrics, and best practice recommendations. A written personalized care plan for preventive services as well as general preventive health recommendations were provided to patient.     Michiel Cowboy, RN  02/19/2017   Medical screening examination/treatment/procedure(s) were performed by non-physician practitioner and as supervising physician I was immediately available for consultation/collaboration. I agree with above. Binnie Rail, MD

## 2017-02-19 NOTE — Patient Instructions (Addendum)
Have blood work done today.    Continue doing brain stimulating activities (puzzles, reading, adult coloring books, staying active) to keep memory sharp.   Continue to eat heart healthy diet (full of fruits, vegetables, whole grains, lean protein, water--limit salt, fat, and sugar intake) and increase physical activity as tolerated.   Ms. Sharon Knight , Thank you for taking time to come for your Medicare Wellness Visit. I appreciate your ongoing commitment to your health goals. Please review the following plan we discussed and let me know if I can assist you in the future.   These are the goals we discussed: Goals      Patient Stated   . patient to consider (pt-stated)          Will put some thought into what she can do; Stressors evaluated;  Gave information for Alz asso Alzheimer's Association / Family information and training  Get help & support [place]  Message boards  Support groups  Education programs  [place], [address] [place], [place] [address] P: [phone] F: [phone]  Families to call the 800 number to get information on all the resources in the area 24 hour 1 [phone]  Can provide resources; Questions about Dementia; Support groups; Give the caregiver information regarding respite ([facility]) Call the [place] on Aging;as they oversee who gets the grant fund for certain areas ([phone]) Also have Tools to help navigate the needs of the patient  Also gave information on [place] Response program        Other   . I want to start walking again          I will begin by trying to walk 1-2 times a week. Perhaps start to go to [facility] or [facility]       This is a list of the screening recommended for you and due dates:  Health Maintenance  Topic Date Due  . Pneumonia vaccines (2 of 2 - PPSV23) 02/12/2016  . DEXA scan (bone density measurement)  07/02/2017  . Mammogram  01/28/2019   . Tetanus Vaccine  03/03/2020  . Colon Cancer Screening  09/10/2021  . Flu Shot  Completed  .  Hepatitis C: One time screening is recommended by Center for Disease Control  (CDC) for  adults born from [birth year] through 1966.   Completed

## 2017-02-19 NOTE — Assessment & Plan Note (Signed)
BP Readings from Last 3 Encounters:  02/19/17 (!) 144/84  08/03/16 130/84  02/05/16 134/86   BP controlled, slightly elevated here today Current regimen effective and well tolerated Continue current medications at current doses cmp

## 2017-03-11 ENCOUNTER — Other Ambulatory Visit: Payer: Self-pay | Admitting: Internal Medicine

## 2017-04-20 ENCOUNTER — Other Ambulatory Visit: Payer: Self-pay | Admitting: Internal Medicine

## 2017-09-03 ENCOUNTER — Other Ambulatory Visit: Payer: Self-pay | Admitting: Internal Medicine

## 2017-09-08 NOTE — Progress Notes (Signed)
Subjective:    Patient ID: Sharon Knight, female    DOB: 1945/07/12, 72 y.o.   MRN: [id number]  HPI The patient is here for follow up.  Over the past 6 months she has improved her eating and is eating less junk food and more vegetables.  She has lost weight.  Overall she feels better.  She is active, but not exercising regularly.  Hyperlipidemia: She is taking her medication daily. She is compliant with a low fat/cholesterol diet. She is not exercising regularly. She denies myalgias.   Prediabetes:  She is more compliant with a low sugar/carbohydrate diet.  She is not exercising regularly.  She has decreased her junk food and has lost weight.    Hypertension: She is taking her medication daily. She is compliant with a low sodium diet.  She denies chest pain, palpitations, edema, shortness of breath and regular headaches. She is not exercising regularly.  She does not monitor her blood pressure at home.     Medications and allergies reviewed with patient and updated if appropriate.  Patient Active Problem List   Diagnosis Date Noted  . Diarrhea 08/03/2016  . Prediabetes 02/05/2016  . History of colonic polyps 10/22/2014  . 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 Vitamin D deficiency 10/31/2012  . ECZEMA 03/03/2010  . 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 Hyperlipidemia 08/02/2007  . Essential hypertension 08/02/2007  . Osteopenia 08/02/2007    Current Outpatient Medications on File Prior to Visit  Medication Sig Dispense Refill  . aspirin 325 MG tablet Take 325 mg by mouth as needed.    Marland Kitchen aspirin 81 MG tablet Take 81 mg by mouth daily.    . Cholecalciferol (VITAMIN D-3) 1000 UNITS CAPS Take 1 capsule by mouth daily.    Mariane Baumgarten Calcium (STOOL SOFTENER PO) Take by mouth. Takes 2 to 3 tablets daily    . hydrochlorothiazide (HYDRODIURIL) 25 MG tablet TAKE (1/2) TABLET DAILY. 45 tablet 1  . metoprolol tartrate (LOPRESSOR) 100 MG tablet TAKE (1/2) TABLET TWICE DAILY.  90 tablet 2  . Multiple Vitamin (MULTIVITAMIN) tablet Take 1 tablet by mouth daily.    . simvastatin (ZOCOR) 40 MG tablet TAKE 1 TABLET EACH DAY. 90 tablet 2   No current facility-administered medications on file prior to visit.     Past Medical History:  Diagnosis Date  . Arthritis   . Bilateral cataracts    Dr Kathrin Penner  . Hyperlipidemia   . Hypertension     Past Surgical History:  Procedure Laterality Date  . COLONOSCOPY     Dr Fuller Plan  . 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 OVARIAN CYST REMOVAL      Social History   Socioeconomic History  . Marital status: Married    Spouse name: Not on file  . Number of children: Not on file  . Years of education: Not on file  . Highest education level: Not on file  Occupational History  . Not on file  Social Needs  . Financial resource strain: Not on file  . Food insecurity:    Worry: Not on file    Inability: Not on file  . Transportation needs:    Medical: Not on file    Non-medical: Not on file  Tobacco Use  . Smoking status: Former Smoker    Last attempt to quit: 08/27/1984    Years since quitting: 33.0  . Smokeless tobacco: Never Used  . Tobacco comment: smoked  age [age], up to 1.5-2 ppd  Substance and Sexual Activity  . Alcohol use: Yes    Alcohol/week: 1.8 oz    Types: 3 Cans of beer per week    Comment: 3 x weekly  . Drug use: No  . Sexual activity: Not on file  Lifestyle  . Physical activity:    Days per week: Not on file    Minutes per session: Not on file  . Stress: Not on file  Relationships  . Social connections:    Talks on phone: Not on file    Gets together: Not on file    Attends religious service: Not on file    Active member of club or organization: Not on file    Attends meetings of clubs or organizations: Not on file    Relationship status: Not on file  Other Topics Concern  . Not on file  Social History Narrative   Exercise: none    Family History  Adopted: Yes  Problem  Relation Age of Onset  . Colon cancer Neg Hx   . Stomach cancer Neg Hx     Review of Systems  Constitutional: Negative for chills and fever.  Respiratory: Negative for cough, shortness of breath and wheezing.   Cardiovascular: Negative for chest pain, palpitations and leg swelling.  Gastrointestinal: Negative for abdominal pain, diarrhea and nausea.  Neurological: Positive for dizziness (occ) and headaches (rare). Negative for light-headedness.       Objective:   Vitals:   09/09/17 0857  BP: 120/76  Pulse: 65  Resp: 16  Temp: 98.2 F (36.8 C)  SpO2: 98%   BP Readings from Last 3 Encounters:  09/09/17 120/76  02/19/17 (!) 144/84  08/03/16 130/84   Wt Readings from Last 3 Encounters:  09/09/17 165 lb (74.8 kg)  02/19/17 177 lb (80.3 kg)  08/03/16 180 lb (81.6 kg)   Body mass index is 26.63 kg/m.   Physical Exam    Constitutional: Appears well-developed and well-nourished. No distress.  HENT:  Head: Normocephalic and atraumatic.  Neck: Neck supple. No tracheal deviation present. No thyromegaly present.  No cervical lymphadenopathy Cardiovascular: Normal rate, regular rhythm and normal heart sounds.   No murmur heard. No carotid bruit .  No edema Pulmonary/Chest: Effort normal and breath sounds normal. No respiratory distress. No has no wheezes. No rales.  Skin: Skin is warm and dry. Not diaphoretic.  Psychiatric: Normal mood and affect. Behavior is normal.      Assessment & Plan:    See Problem List for Assessment and Plan of chronic medical problems.

## 2017-09-08 NOTE — Patient Instructions (Addendum)
  Test(s) ordered today. Your results will be released to MyChart (or called to you) after review, usually within 72hours after test completion. If any changes need to be made, you will be notified at that same time.  Medications reviewed and updated.  No changes recommended at this time.    Please followup in 6 months   

## 2017-09-09 ENCOUNTER — Other Ambulatory Visit (INDEPENDENT_AMBULATORY_CARE_PROVIDER_SITE_OTHER): Payer: Medicare Other

## 2017-09-09 ENCOUNTER — Encounter: Payer: Self-pay | Admitting: Internal Medicine

## 2017-09-09 ENCOUNTER — Ambulatory Visit: Payer: Medicare Other | Admitting: Internal Medicine

## 2017-09-09 VITALS — BP 120/76 | HR 65 | Temp 98.2°F | Resp 16 | Ht 66.0 in | Wt 165.0 lb

## 2017-09-09 DIAGNOSIS — I10 Essential (primary) hypertension: Secondary | ICD-10-CM

## 2017-09-09 DIAGNOSIS — E782 Mixed hyperlipidemia: Secondary | ICD-10-CM

## 2017-09-09 DIAGNOSIS — R7303 Prediabetes: Secondary | ICD-10-CM | POA: Diagnosis not present

## 2017-09-09 LAB — COMPREHENSIVE METABOLIC PANEL
ALBUMIN: 4.2 g/dL (ref 3.5–5.2)
ALT: 12 U/L (ref 0–35)
AST: 18 U/L (ref 0–37)
Alkaline Phosphatase: 58 U/L (ref 39–117)
BILIRUBIN TOTAL: 0.6 mg/dL (ref 0.2–1.2)
BUN: 20 mg/dL (ref 6–23)
CALCIUM: 9.7 mg/dL (ref 8.4–10.5)
CHLORIDE: 102 meq/L (ref 96–112)
CO2: 30 mEq/L (ref 19–32)
CREATININE: 1.09 mg/dL (ref 0.40–1.20)
GFR: 52.27 mL/min — ABNORMAL LOW (ref >60.00)
Glucose, Bld: 121 mg/dL — ABNORMAL HIGH (ref 70–99)
Potassium: 3.9 mEq/L (ref 3.5–5.1)
Sodium: 139 mEq/L (ref 135–145)
TOTAL PROTEIN: 7.4 g/dL (ref 6.0–8.3)

## 2017-09-09 LAB — HEMOGLOBIN A1C: Hgb A1c MFr Bld: 6 % (ref 4.6–6.5)

## 2017-09-09 LAB — LIPID PANEL
CHOL/HDL RATIO: 3
CHOLESTEROL: 169 mg/dL (ref 0–200)
HDL: 60.3 mg/dL (ref >39.00)
LDL Cholesterol: 91 mg/dL (ref 0–99)
NonHDL: 108.43
TRIGLYCERIDES: 86 mg/dL (ref 0.0–149.0)
VLDL: 17.2 mg/dL (ref 0.0–40.0)

## 2017-09-09 MED ORDER — ACETAMINOPHEN 325 MG PO TABS: 650.0000 mg | ORAL_TABLET | Freq: Four times a day (QID) | ORAL | Status: DC | PRN

## 2017-09-09 NOTE — Assessment & Plan Note (Signed)
Check lipid panel  Continue daily statin Regular exercise and healthy diet encouraged  

## 2017-09-09 NOTE — Assessment & Plan Note (Signed)
BP well controlled Current regimen effective and well tolerated Continue current medications at current doses cmp  

## 2017-09-09 NOTE — Assessment & Plan Note (Signed)
Check a1c Low sugar / carb diet Stressed regular exercise   

## 2017-10-05 DIAGNOSIS — H02054 Trichiasis without entropian left upper eyelid: Secondary | ICD-10-CM | POA: Diagnosis not present

## 2017-11-03 DIAGNOSIS — N83202 Unspecified ovarian cyst, left side: Secondary | ICD-10-CM | POA: Diagnosis not present

## 2017-11-26 DIAGNOSIS — Z961 Presence of intraocular lens: Secondary | ICD-10-CM | POA: Diagnosis not present

## 2017-11-26 DIAGNOSIS — H43813 Vitreous degeneration, bilateral: Secondary | ICD-10-CM | POA: Diagnosis not present

## 2017-11-26 DIAGNOSIS — H02054 Trichiasis without entropian left upper eyelid: Secondary | ICD-10-CM | POA: Diagnosis not present

## 2017-11-26 DIAGNOSIS — E119 Type 2 diabetes mellitus without complications: Secondary | ICD-10-CM | POA: Diagnosis not present

## 2017-11-26 LAB — HM DIABETES EYE EXAM

## 2017-12-01 ENCOUNTER — Encounter: Payer: Self-pay | Admitting: Internal Medicine

## 2017-12-10 ENCOUNTER — Inpatient Hospital Stay (HOSPITAL_COMMUNITY): Payer: Medicare Other

## 2017-12-10 ENCOUNTER — Encounter (HOSPITAL_COMMUNITY): Payer: Self-pay | Admitting: Emergency Medicine

## 2017-12-10 ENCOUNTER — Other Ambulatory Visit (INDEPENDENT_AMBULATORY_CARE_PROVIDER_SITE_OTHER): Payer: Medicare Other

## 2017-12-10 ENCOUNTER — Ambulatory Visit: Payer: Medicare Other | Admitting: Family

## 2017-12-10 ENCOUNTER — Other Ambulatory Visit: Payer: Self-pay

## 2017-12-10 ENCOUNTER — Inpatient Hospital Stay (HOSPITAL_COMMUNITY)
Admission: EM | Admit: 2017-12-10 | Discharge: 2017-12-16 | DRG: 871 | Disposition: A | Discharge status: Home / Self Care | Payer: Medicare Other | Attending: Internal Medicine | Admitting: Internal Medicine

## 2017-12-10 ENCOUNTER — Emergency Department (HOSPITAL_COMMUNITY): Payer: Medicare Other

## 2017-12-10 ENCOUNTER — Encounter: Payer: Self-pay | Admitting: Family

## 2017-12-10 VITALS — BP 126/74 | HR 73 | Temp 98.5°F | Ht 66.0 in | Wt 163.0 lb

## 2017-12-10 DIAGNOSIS — J181 Lobar pneumonia, unspecified organism: Secondary | ICD-10-CM | POA: Diagnosis not present

## 2017-12-10 DIAGNOSIS — Z87891 Personal history of nicotine dependence: Secondary | ICD-10-CM

## 2017-12-10 DIAGNOSIS — R112 Nausea with vomiting, unspecified: Secondary | ICD-10-CM

## 2017-12-10 DIAGNOSIS — F419 Anxiety disorder, unspecified: Secondary | ICD-10-CM | POA: Diagnosis present

## 2017-12-10 DIAGNOSIS — Z905 Acquired absence of kidney: Secondary | ICD-10-CM | POA: Diagnosis not present

## 2017-12-10 DIAGNOSIS — N179 Acute kidney failure, unspecified: Secondary | ICD-10-CM

## 2017-12-10 DIAGNOSIS — R0602 Shortness of breath: Secondary | ICD-10-CM | POA: Diagnosis not present

## 2017-12-10 DIAGNOSIS — E876 Hypokalemia: Secondary | ICD-10-CM | POA: Diagnosis present

## 2017-12-10 DIAGNOSIS — E785 Hyperlipidemia, unspecified: Secondary | ICD-10-CM | POA: Diagnosis present

## 2017-12-10 DIAGNOSIS — Z79899 Other long term (current) drug therapy: Secondary | ICD-10-CM | POA: Diagnosis not present

## 2017-12-10 DIAGNOSIS — M109 Gout, unspecified: Secondary | ICD-10-CM | POA: Diagnosis not present

## 2017-12-10 DIAGNOSIS — A419 Sepsis, unspecified organism: Secondary | ICD-10-CM | POA: Diagnosis not present

## 2017-12-10 DIAGNOSIS — J168 Pneumonia due to other specified infectious organisms: Secondary | ICD-10-CM | POA: Diagnosis not present

## 2017-12-10 DIAGNOSIS — I4891 Unspecified atrial fibrillation: Secondary | ICD-10-CM | POA: Diagnosis not present

## 2017-12-10 DIAGNOSIS — M79671 Pain in right foot: Secondary | ICD-10-CM | POA: Diagnosis present

## 2017-12-10 DIAGNOSIS — I480 Paroxysmal atrial fibrillation: Secondary | ICD-10-CM

## 2017-12-10 DIAGNOSIS — R52 Pain, unspecified: Secondary | ICD-10-CM

## 2017-12-10 DIAGNOSIS — J189 Pneumonia, unspecified organism: Secondary | ICD-10-CM

## 2017-12-10 DIAGNOSIS — I4892 Unspecified atrial flutter: Secondary | ICD-10-CM | POA: Diagnosis present

## 2017-12-10 DIAGNOSIS — R6520 Severe sepsis without septic shock: Secondary | ICD-10-CM | POA: Diagnosis not present

## 2017-12-10 DIAGNOSIS — I10 Essential (primary) hypertension: Secondary | ICD-10-CM | POA: Diagnosis present

## 2017-12-10 DIAGNOSIS — R5383 Other fatigue: Secondary | ICD-10-CM | POA: Diagnosis present

## 2017-12-10 LAB — COMPREHENSIVE METABOLIC PANEL
ALT: 10 U/L (ref 0–35)
AST: 19 U/L (ref 0–37)
Albumin: 4.1 g/dL (ref 3.5–5.2)
Alkaline Phosphatase: 60 U/L (ref 39–117)
BUN: 25 mg/dL — ABNORMAL HIGH (ref 6–23)
CHLORIDE: 97 meq/L (ref 96–112)
CO2: 30 meq/L (ref 19–32)
CREATININE: 1.56 mg/dL — AB (ref 0.40–1.20)
Calcium: 10.2 mg/dL (ref 8.4–10.5)
GFR: 34.54 mL/min — ABNORMAL LOW (ref >60.00)
GLUCOSE: 152 mg/dL — AB (ref 70–99)
POTASSIUM: 4.2 meq/L (ref 3.5–5.1)
SODIUM: 136 meq/L (ref 135–145)
Total Bilirubin: 1.6 mg/dL — ABNORMAL HIGH (ref 0.2–1.2)
Total Protein: 8.3 g/dL (ref 6.0–8.3)

## 2017-12-10 LAB — CBC WITH DIFFERENTIAL/PLATELET
BASOS PCT: 0.6 % (ref 0.0–3.0)
Basophils Absolute: 0.1 10*3/uL (ref 0.0–0.1)
EOS PCT: 0 % (ref 0.0–5.0)
Eosinophils Absolute: 0 10*3/uL (ref 0.0–0.7)
HCT: 43.9 % (ref 36.0–46.0)
HEMOGLOBIN: 15.1 g/dL — AB (ref 12.0–15.0)
Lymphocytes Relative: 5.4 % — ABNORMAL LOW (ref 12.0–46.0)
Lymphs Abs: 1.2 10*3/uL (ref 0.7–4.0)
MCHC: 34.4 g/dL (ref 30.0–36.0)
MCV: 90 fl (ref 78.0–100.0)
MONO ABS: 1.5 10*3/uL — AB (ref 0.1–1.0)
Monocytes Relative: 6.7 % (ref 3.0–12.0)
NEUTROS ABS: 19 10*3/uL — AB (ref 1.4–7.7)
Neutrophils Relative %: 87.3 % — ABNORMAL HIGH (ref 43.0–77.0)
PLATELETS: 284 10*3/uL (ref 150.0–400.0)
RBC: 4.88 Mil/uL (ref 3.87–5.11)
RDW: 12.7 % (ref 11.5–15.5)

## 2017-12-10 LAB — URINALYSIS, ROUTINE W REFLEX MICROSCOPIC
BILIRUBIN URINE: NEGATIVE
Bacteria, UA: NONE SEEN
Bilirubin Urine: NEGATIVE
GLUCOSE, UA: NEGATIVE mg/dL
KETONES UR: NEGATIVE mg/dL
Ketones, ur: NEGATIVE
Nitrite: NEGATIVE
Nitrite: NEGATIVE
PROTEIN: NEGATIVE mg/dL
SPECIFIC GRAVITY, URINE: 1.02 (ref 1.000–1.030)
Specific Gravity, Urine: 1.005 (ref 1.005–1.030)
TOTAL PROTEIN, URINE-UPE24: 100 — AB
Urine Glucose: NEGATIVE
Urobilinogen, UA: 1 (ref 0.0–1.0)
pH: 5.5 (ref 5.0–8.0)
pH: 6 (ref 5.0–8.0)

## 2017-12-10 LAB — CG4 I-STAT (LACTIC ACID)
LACTIC ACID, VENOUS: 1.58 mmol/L (ref 0.5–1.9)
Lactic Acid, Venous: 1.88 mmol/L (ref 0.5–1.9)

## 2017-12-10 LAB — TROPONIN I: Troponin I: <0.03 ng/mL (ref <0.03)

## 2017-12-10 MED ORDER — SODIUM CHLORIDE 0.9 % IV BOLUS (SEPSIS)
1000.0000 mL | Freq: Once | INTRAVENOUS | Status: AC
  Administered 2017-12-10: 1000 mL via INTRAVENOUS

## 2017-12-10 MED ORDER — SODIUM CHLORIDE 0.9% FLUSH
3.0000 mL | Freq: Two times a day (BID) | INTRAVENOUS | Status: DC
  Administered 2017-12-10 – 2017-12-16 (×12): 3 mL via INTRAVENOUS

## 2017-12-10 MED ORDER — VERAPAMIL HCL 2.5 MG/ML IV SOLN
2.5000 mg | Freq: Once | INTRAVENOUS | Status: AC
  Administered 2017-12-10: 2.5 mg via INTRAVENOUS
  Filled 2017-12-10: qty 2

## 2017-12-10 MED ORDER — SODIUM CHLORIDE 0.9 % IV SOLN
1.0000 g | INTRAVENOUS | Status: DC
  Administered 2017-12-11 – 2017-12-14 (×4): 1 g via INTRAVENOUS
  Filled 2017-12-10 (×3): qty 1
  Filled 2017-12-10 (×2): qty 10
  Filled 2017-12-10: qty 1

## 2017-12-10 MED ORDER — ONDANSETRON HCL 4 MG/2ML IJ SOLN: 4.0000 mg | Freq: Four times a day (QID) | INTRAMUSCULAR | Status: DC | PRN

## 2017-12-10 MED ORDER — SODIUM CHLORIDE 0.9 % IV SOLN
2.0000 g | INTRAVENOUS | Status: DC
  Administered 2017-12-10: 2 g via INTRAVENOUS
  Filled 2017-12-10: qty 20

## 2017-12-10 MED ORDER — SODIUM CHLORIDE 0.9 % IV SOLN
500.0000 mg | INTRAVENOUS | Status: DC
  Filled 2017-12-10: qty 500

## 2017-12-10 MED ORDER — METOPROLOL TARTRATE 25 MG PO TABS
50.0000 mg | ORAL_TABLET | Freq: Two times a day (BID) | ORAL | Status: DC
  Administered 2017-12-10: 50 mg via ORAL
  Filled 2017-12-10: qty 2

## 2017-12-10 MED ORDER — ACETAMINOPHEN 650 MG RE SUPP: 650.0000 mg | Freq: Four times a day (QID) | RECTAL | Status: DC | PRN

## 2017-12-10 MED ORDER — SODIUM CHLORIDE 0.9 % IV BOLUS (SEPSIS): 250.0000 mL | Freq: Once | INTRAVENOUS | Status: DC

## 2017-12-10 MED ORDER — SODIUM CHLORIDE 0.9 % IV SOLN
INTRAVENOUS | Status: DC
  Administered 2017-12-10 – 2017-12-12 (×5): via INTRAVENOUS

## 2017-12-10 MED ORDER — ENOXAPARIN SODIUM 80 MG/0.8ML ~~LOC~~ SOLN
1.0000 mg/kg | Freq: Two times a day (BID) | SUBCUTANEOUS | Status: DC
  Administered 2017-12-10 – 2017-12-14 (×8): 75 mg via SUBCUTANEOUS
  Filled 2017-12-10 (×8): qty 0.75

## 2017-12-10 MED ORDER — SIMVASTATIN 40 MG PO TABS
40.0000 mg | ORAL_TABLET | Freq: Every evening | ORAL | Status: DC
  Administered 2017-12-10: 40 mg via ORAL
  Filled 2017-12-10: qty 1

## 2017-12-10 MED ORDER — ONDANSETRON HCL 4 MG PO TABS: 4.0000 mg | ORAL_TABLET | Freq: Four times a day (QID) | ORAL | Status: DC | PRN

## 2017-12-10 MED ORDER — ACETAMINOPHEN 325 MG PO TABS
650.0000 mg | ORAL_TABLET | Freq: Four times a day (QID) | ORAL | Status: DC | PRN
  Administered 2017-12-11: 650 mg via ORAL
  Administered 2017-12-14: 325 mg via ORAL
  Filled 2017-12-10 (×2): qty 2

## 2017-12-10 MED ORDER — ACETAMINOPHEN 325 MG PO TABS
650.0000 mg | ORAL_TABLET | Freq: Once | ORAL | Status: AC
  Administered 2017-12-10: 650 mg via ORAL
  Filled 2017-12-10: qty 2

## 2017-12-10 MED ORDER — SODIUM CHLORIDE 0.9 % IV SOLN
500.0000 mg | INTRAVENOUS | Status: DC
  Administered 2017-12-10: 500 mg via INTRAVENOUS
  Filled 2017-12-10: qty 500

## 2017-12-10 MED ORDER — METOPROLOL TARTRATE 5 MG/5ML IV SOLN
2.5000 mg | INTRAVENOUS | Status: AC
  Administered 2017-12-10 (×4): 2.5 mg via INTRAVENOUS
  Filled 2017-12-10: qty 5

## 2017-12-10 NOTE — ED Triage Notes (Signed)
Pt sent from PCP for elevated WBC when had blood drawn today. Pt reports been sick since Tuesday, felling "foggy headed" and vomiting, "but not much lately due to not eating very much". Reports today feeling little better than the past days.

## 2017-12-10 NOTE — ED Notes (Signed)
One set of blood cultures is in the main lab

## 2017-12-10 NOTE — Progress Notes (Signed)
ANTICOAGULATION CONSULT NOTE - Initial Consult  Pharmacy Consult for enoxaparin Indication: atrial fibrillation  Allergies  Allergen Reactions  . Ibuprofen Other (See Comments)    Dizziness "made me woozy"    Patient Measurements:   Pt weight: 74kg  Vital Signs: Temp: 100.4 F (38 C) (08/09 1347) Temp Source: Oral (08/09 1347) BP: 112/90 (08/09 1830) Pulse Rate: 94 (08/09 1830)  Labs: Recent Labs    12/10/17 1135 12/10/17 1539  HGB 15.1*  --   HCT 43.9  --   PLT 284.0  --   CREATININE 1.56*  --   TROPONINI  --  <0.03    Estimated Creatinine Clearance: 33.1 mL/min (A) (by C-G formula based on SCr of 1.56 mg/dL (H)).   Medical History: Past Medical History:  Diagnosis Date  . Arthritis   . Bilateral cataracts    Dr Kathrin Penner  . Hyperlipidemia   . Hypertension      Assessment: 72 year old woman PMH hypertension, hyperlipidemia, presented to the emergency department at the direction of her PCP for elevated WBC.  Was found to have lobar pneumonia, acute kidney injury, atrial fibrillation with rapid ventricular.  Pharmacy consulted to dose enoxaparin for AFib.  No prior anticoagulation noted.  12/10/2017 Scr 1.56, CrCl ~ 33.56mls/min CBC WNL  Goal of Therapy:  Anti-Xa level 0.6-1 units/ml 4hrs after LMWH dose given Monitor platelets by anticoagulation protocol   Plan:  Enoxaparin 1mg /kg (75mg ) SQ q12h Monitor renal function check CBC Q72h  Dolly Rias RPh 12/10/2017, 7:17 PM Pager [phone]

## 2017-12-10 NOTE — Progress Notes (Signed)
Sharon Knight is a 72 y.o. female with the following history as recorded in EpicCare:  Patient Active Problem List   Diagnosis Date Noted  . Prediabetes 02/05/2016  . History of colonic polyps 10/22/2014  . 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 Vitamin D deficiency 10/31/2012  . ECZEMA 03/03/2010  . 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 Hyperlipidemia 08/02/2007  . Essential hypertension 08/02/2007  . Osteopenia 08/02/2007    Current Outpatient Medications  Medication Sig Dispense Refill  . acetaminophen (TYLENOL) 325 MG tablet Take 2 tablets (650 mg total) by mouth every 6 (six) hours as needed.    Mariane Baumgarten Calcium (STOOL SOFTENER PO) Take by mouth. Takes 2 to 3 tablets daily    . fluocinonide cream (LIDEX) 0.05 % fluocinonide 0.05 % topical cream    . hydrochlorothiazide (HYDRODIURIL) 25 MG tablet TAKE (1/2) TABLET DAILY. 45 tablet 1  . metoprolol tartrate (LOPRESSOR) 100 MG tablet TAKE (1/2) TABLET TWICE DAILY. 90 tablet 2  . moxifloxacin (VIGAMOX) 0.5 % ophthalmic solution Vigamox 0.5 % eye drops    . Multiple Vitamin (MULTIVITAMIN) tablet Take 1 tablet by mouth daily.    . prednisoLONE acetate (PRED FORTE) 1 % ophthalmic suspension prednisolone acetate 1 % eye drops,suspension    . simvastatin (ZOCOR) 40 MG tablet TAKE 1 TABLET EACH DAY. 90 tablet 2   No current facility-administered medications for this visit.     Allergies: Ibuprofen  Past Medical History:  Diagnosis Date  . Arthritis   . Bilateral cataracts    Dr Kathrin Penner  . Hyperlipidemia   . Hypertension     Past Surgical History:  Procedure Laterality Date  . COLONOSCOPY     Dr Fuller Plan  . 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 OVARIAN CYST REMOVAL      Family History  Adopted: Yes  Problem Relation Age of Onset  . Colon cancer Neg Hx   . Stomach cancer Neg Hx     Social History   Tobacco Use  . Smoking status: Former Smoker    Last attempt to quit: 08/27/1984    Years since  quitting: 33.3  . Smokeless tobacco: Never Used  . Tobacco comment: smoked age [age], up to 1.5-2 ppd  Substance Use Topics  . Alcohol use: Yes    Alcohol/week: 3.0 standard drinks    Types: 3 Cans of beer per week    Comment: 3 x weekly    Subjective:  Patient started with "flu-like" symptoms suddenly on Tuesday evening; decreased appetite;  per patient, she felt weak/ groggy; "felt like I was swimming through mud." Denies any fever or chills but "felt hot" on Tuesday evening; denies any chest pain or shortness of breath; vomited yesterday after eating soup but has eaten cereal this morning and able to keep that down; Does feel like she is actually doing better today than earlier in the week; no burning on urination; no known exposure to tick bites; no abdominal pain;  Objective:  Vitals:   12/10/17 1101  BP: 126/74  Pulse: 73  Temp: 98.5 F (36.9 C)  TempSrc: Oral  SpO2: 97%  Weight: 163 lb 0.6 oz (74 kg)  Height: '5\' 6"'  (1.676 m)    General: Well developed, well nourished, in no acute distress  Skin : Warm and dry.  Head: Normocephalic and atraumatic  Eyes: Sclera and conjunctiva clear; pupils round and reactive to light; extraocular movements intact  Ears: External normal; canals clear; tympanic membranes  normal  Oropharynx: Pink, supple. No suspicious lesions  Neck: Supple without thyromegaly, adenopathy  Lungs: Respirations unlabored; clear to auscultation bilaterally without wheeze, rales, rhonchi  CVS exam: normal rate and regular rhythm.  Extremities: No edema, cyanosis, clubbing  Vessels: Symmetric bilaterally  Neurologic: Alert and oriented; speech intact; face symmetrical; moves all extremities well; CNII-XII intact without focal deficit   Assessment:  1. Nausea and vomiting, intractability of vomiting not specified, unspecified vomiting type     Plan:  ? Viral illness; update labs to include CBC, CMP and U/A; follow-up to be determined.    No follow-ups on file.   Orders Placed This Encounter  Procedures  . CBC w/Diff    Standing Status:   Future    Number of Occurrences:   1    Standing Expiration Date:   12/10/2018  . Comp Met (CMET)    Standing Status:   Future    Number of Occurrences:   1    Standing Expiration Date:   12/10/2018  . Urinalysis    Standing Status:   Future    Number of Occurrences:   1    Standing Expiration Date:   12/10/2018    Requested Prescriptions    No prescriptions requested or ordered in this encounter

## 2017-12-10 NOTE — Progress Notes (Signed)
Sent to ER for further evaluation; marked leukocytosis; elevated creatinine- ? AKI?  She agrees and will go to [facility] for further eval.

## 2017-12-10 NOTE — ED Notes (Signed)
ED TO INPATIENT HANDOFF REPORT  Name/Age/Gender Sharon Knight 72 y.o. female  Code Status   Home/SNF/Other Home  Chief Complaint high white count  Level of Care/Admitting Diagnosis ED Disposition    ED Disposition Condition [facility] Area: [place] [100102]  Level of Care: Stepdown [14]  Admit to SDU based on following criteria: Hemodynamic compromise or significant risk of instability:  Patient requiring short term acute titration and management of vasoactive drips, and invasive monitoring (i.e., CVP and Arterial line).  Diagnosis: Lobar pneumonia [facility]) [[id number]]  Admitting Physician: [place], [place]  Attending Physician: Samuella Cota [[id number]]  Estimated length of stay: past midnight tomorrow  Certification:: I certify this patient will need inpatient services for at least 2 midnights  PT Class (Do Not Modify): Inpatient [101]  PT Acc Code (Do Not Modify): Private [1]       Medical History Past Medical History:  Diagnosis Date  . Arthritis   . Bilateral cataracts    Dr Kathrin Penner  . Hyperlipidemia   . Hypertension     Allergies Allergies  Allergen Reactions  . Ibuprofen Other (See Comments)    Dizziness "made me woozy"    IV Location/Drains/Wounds Patient Lines/Drains/Airways Status   Active Line/Drains/Airways    Name:   Placement date:   Placement time:   Site:   Days:   Peripheral IV 12/10/17 Left Antecubital   12/10/17    1604    Antecubital   less than 1   Peripheral IV 12/10/17 Left Hand   12/10/17    1605    Hand   less than 1          Labs/Imaging Results for orders placed or performed during the hospital encounter of 12/10/17 (from the past 48 hour(s))  CG4 I-STAT (Lactic acid)     Status: None   Collection Time: 12/10/17  2:13 PM  Result Value Ref Range   Lactic Acid, Venous 1.58 0.5 - 1.9 mmol/L  Troponin I     Status: None   Collection Time: 12/10/17  3:39 PM  Result Value Ref Range    Troponin I <0.03 <0.03 ng/mL    Comment: Performed at [facility], [place] [address] [place], [place] [address]  CG4 I-STAT (Lactic acid)     Status: None   Collection Time: 12/10/17  3:48 PM  Result Value Ref Range   Lactic Acid, Venous 1.88 0.5 - 1.9 mmol/L  Urinalysis, Routine w reflex microscopic     Status: Abnormal   Collection Time: 12/10/17  5:30 PM  Result Value Ref Range   Color, Urine YELLOW YELLOW   APPearance CLEAR CLEAR   Specific Gravity, Urine 1.005 1.005 - 1.030   pH 6.0 5.0 - 8.0   Glucose, UA NEGATIVE NEGATIVE mg/dL   Hgb urine dipstick SMALL (A) NEGATIVE   Bilirubin Urine NEGATIVE NEGATIVE   Ketones, ur NEGATIVE NEGATIVE mg/dL   Protein, ur NEGATIVE NEGATIVE mg/dL   Nitrite NEGATIVE NEGATIVE   Leukocytes, UA SMALL (A) NEGATIVE   RBC / HPF 0-5 0 - 5 RBC/hpf   WBC, UA 11-20 0 - 5 WBC/hpf   Bacteria, UA NONE SEEN NONE SEEN   Squamous Epithelial / LPF 0-5 0 - 5    Comment: Performed at [facility], [place] [address], [place],  [address]   Dg Chest 2 View  Result Date: 12/10/2017 CLINICAL DATA:  Vomiting EXAM: CHEST - 2 VIEW COMPARISON:  None.  FINDINGS: There are airspace opacities towards the base of the left lower lobe. Lungs otherwise clear. Heart is normal in size. No pneumothorax or pleural effusion. IMPRESSION: Left lower lobe pneumonia. Followup PA and lateral chest X-ray is recommended in 3-4 weeks following trial of antibiotic therapy to ensure resolution and exclude underlying malignancy. Electronically Signed   By: Marybelle Killings M.D.   On: 12/10/2017 14:45    Pending Labs Unresulted Labs (From admission, onward)    Start     Ordered   12/10/17 1518  Blood Culture (routine x 2)  BLOOD CULTURE X 2,   STAT     12/10/17 1518   Signed and Held  HIV antibody (Routine Screening)  Tomorrow morning,   R     Signed and Held   Signed and Held  Culture, sputum-assessment  Once,   R     Signed and Held   Signed and Held   Gram stain  Once,   R     Signed and Held   Signed and Held  Strep pneumoniae urinary antigen  Once,   R     Signed and Held   Signed and Held  TSH  Tomorrow morning,   R     Signed and Held   Signed and Held  Basic metabolic panel  Tomorrow morning,   R     Signed and Held   Signed and Held  CBC  Tomorrow morning,   R     Signed and Held          Vitals/Pain Today's Vitals   12/10/17 1530 12/10/17 1600 12/10/17 1700 12/10/17 1800  BP: 116/78 113/72 117/84 111/77  Pulse: (!) 105   (!) 130  Resp: 15 (!) 23 20 18   Temp:      TempSrc:      SpO2: 97%   96%  PainSc:        Isolation Precautions No active isolations  Medications Medications  sodium chloride 0.9 % bolus 1,000 mL (0 mLs Intravenous Stopped 12/10/17 1653)    And  sodium chloride 0.9 % bolus 1,000 mL (1,000 mLs Intravenous New Bag/Given 12/10/17 1641)    And  sodium chloride 0.9 % bolus 250 mL (has no administration in time range)  cefTRIAXone (ROCEPHIN) 2 g in sodium chloride 0.9 % 100 mL IVPB (0 g Intravenous Stopped 12/10/17 1654)  azithromycin (ZITHROMAX) 500 mg in sodium chloride 0.9 % 250 mL IVPB (500 mg Intravenous New Bag/Given 12/10/17 1640)  acetaminophen (TYLENOL) tablet 650 mg (650 mg Oral Given 12/10/17 1705)  verapamil (ISOPTIN) injection 2.5 mg (2.5 mg Intravenous Given 12/10/17 1802)    Mobility walks

## 2017-12-10 NOTE — H&P (Signed)
History and Physical  Sharon Knight JJK:[id number] DOB: 07-27-1945 DOA: 12/10/2017  PCP: Binnie Rail, MD   Chief Complaint: feels fatigued  HPI:  72 year old woman PMH hypertension, hyperlipidemia, presented to the emergency department at the direction of her primary care physician for elevated WBC.  Was found to have lobar pneumonia, acute kidney injury, atrial fibrillation with rapid ventricular response but did not meet sepsis criteria.  She reports she is felt poorly for approximately a week, extreme fatigue, hardly walking, very poor appetite, did not eat for the last 3 days other than one meal.  She said no cough no fever, no systemic symptoms or shortness of breath.  No other associated symptoms.  No palpitations or chest pain.  ED Course: Low-grade temperature, tachycardic, normotensive, no hypoxia, normal respiratory rate.  Treated with ceftriaxone, azithromycin, IV fluids.  Review of Systems:  Negative for fever, sweats, chills, visual changes, sore throat, rash, new muscle aches, chest pain, SOB, dysuria, bleeding, abdominal pain.  Past Medical History:  Diagnosis Date  . Arthritis   . Bilateral cataracts    Dr Kathrin Penner  . Hyperlipidemia   . Hypertension     Past Surgical History:  Procedure Laterality Date  . COLONOSCOPY     Dr Fuller Plan  . 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 OVARIAN CYST REMOVAL       reports that she quit smoking about 33 years ago. She has never used smokeless tobacco. She reports that she drinks about 3.0 standard drinks of alcohol per week. She reports that she does not use drugs. Mobility: Ambulatory  Allergies  Allergen Reactions  . Ibuprofen Other (See Comments)    Dizziness "made me woozy"    Family History  Adopted: Yes  Problem Relation Age of Onset  . Colon cancer Neg Hx   . Stomach cancer Neg Hx      Prior to Admission medications   Medication Sig Start Date End Date Taking? Authorizing Provider  acetaminophen  (TYLENOL) 325 MG tablet Take 2 tablets (650 mg total) by mouth every 6 (six) hours as needed. Patient taking differently: Take 325 mg by mouth 2 (two) times daily as needed (pain.).  09/09/17  Yes Burns, Claudina Lick, MD  Docusate Calcium (STOOL SOFTENER PO) Take 2-3 capsules by mouth See admin instructions. Every night at bedtime.   Yes [provider]  hydrochlorothiazide (HYDRODIURIL) 25 MG tablet TAKE (1/2) TABLET DAILY. Patient taking differently: Take 12.5 mg by mouth daily.  09/03/17  Yes Burns, Claudina Lick, MD  metoprolol tartrate (LOPRESSOR) 100 MG tablet TAKE (1/2) TABLET TWICE DAILY. Patient taking differently: Take 50 mg by mouth 2 (two) times daily.  04/20/17  Yes Burns, Claudina Lick, MD  Multiple Vitamin (MULTIVITAMIN) tablet Take 1 tablet by mouth daily.   Yes [provider]  simvastatin (ZOCOR) 40 MG tablet TAKE 1 TABLET EACH DAY. Patient taking differently: Take 40 mg by mouth every evening.  04/20/17  Yes Binnie Rail, MD    Physical Exam: Vitals:   12/10/17 1700 12/10/17 1800  BP: 117/84 111/77  Pulse:  (!) 130  Resp: 20 18  Temp:    SpO2:  96%    Constitutional:   . Appears calm and comfortable Eyes:  . pupils and irises appear normal . Normal lids  ENMT:  . grossly normal hearing  . Lips appear normal Neck:  . neck appears normal, no masses . no thyromegaly Respiratory:  . Clear anteriorly.  Inspiratory crackles  left base.  No rhonchi or wheezes. Marland Kitchen Respiratory effort appears grossly normal.  Speaks in full sentences. Cardiovascular:  . Tachycardic, irregular, no m/r/g . No LE extremity edema   Abdomen:  . Soft, nontender, nondistended.  No hepatomegaly. Musculoskeletal:  . Digits/nails BUE: no clubbing, cyanosis, petechiae, infection . RUE, LUE, RLE, LLE   o strength and tone normal, no atrophy, no abnormal movements o No tenderness, masses Skin:  . No rashes, lesions, ulcers . palpation of skin: no induration or nodules Psychiatric:  . Mental  status o Mood, affect appropriate . judgment and insight appear intact  I have personally reviewed following labs and imaging studies  Labs:   BUN 25, creatinine 1.56.  Troponin negative.  Lactic acid within normal limits.  WBC 21.8.  Imaging studies:   Chest x-ray left lower lobe pneumonia  Medical tests:   EKG independently reviewed: A flutter variable block, ventricular rate 134.  Principal Problem:   Lobar pneumonia ([place]) Active Problems:   Atrial fibrillation with RVR (HCC)   AKI (acute kidney injury) ([place])   Assessment/Plan Lobar pneumonia with leukocytosis but no hypoxia.   --Empiric ceftriaxone, azithromycin.  Does not meet criteria for sepsis.  Atrial fibrillation/flutter with rapid ventricular response.  Completely asymptomatic.  No previous history. Heart rate documented is 73 during office visit earlier today. --Patient did take metoprolol this morning. --Received calcium channel blocker in the emergency department.  Will give extra dose of metoprolol now.  PRN IV metoprolol.  If rate remains uncontrolled may need to initiate CCB infusion. --Check TSH in a.m.  Echocardiogram in a.m.  Acute kidney injury.  Secondary to poor oral intake, pneumonia. --Expect resolution with IV fluids.  Severity of Illness: The appropriate patient status for this patient is INPATIENT. Inpatient status is judged to be reasonable and necessary in order to provide the required intensity of service to ensure the patient's safety. The patient's presenting symptoms, physical exam findings, and initial radiographic and laboratory data in the context of their chronic comorbidities is felt to place them at high risk for further clinical deterioration. Furthermore, it is not anticipated that the patient will be medically stable for discharge from the hospital within 2 midnights of admission.   * I certify that at the point of admission it is my clinical judgment that the patient will  require inpatient hospital care spanning beyond 2 midnights from the point of admission due to high intensity of service, high risk for further deterioration and high frequency of surveillance required.*  DVT prophylaxis:enoxaparin Code Status: Full Family Communication: husband at bedside Consults called: none    Time spent: 60 minutes  Murray Hodgkins, MD  [facility] Direct contact: [phone] --[place] [place]  --[email or web address]; password [email or web address]  7PM-7AM contact night coverage as above  12/10/2017, 6:08 PM

## 2017-12-10 NOTE — ED Notes (Signed)
Pt being sent by PCP.  C/o fatigue for "a long time and has gotten worse" x 2-3 days.  WBC 21.8.

## 2017-12-10 NOTE — ED Provider Notes (Signed)
[place] DEPT Provider Note   CSN: [id number] Arrival date & time: 12/10/17  1340     History   Chief Complaint Chief Complaint  Patient presents with  . elevated WBC    HPI Sharon Knight is a 72 y.o. female.  The history is provided by the patient and medical records. No language interpreter was used.     72 year old female with history of hypertension, hyperlipidemia presenting for further evaluation of elevated white blood cells.  Patient report for the past 4 to 5 days she has been feeling well, she describes generalized fatigue, feeling "muggy" and not herself.  She went to see her PCP today, and had blood work done and went home.  Since then she felt better however she received a phone call from her PCP office stating that her white count is high and she would need to come to the ER.  Patient otherwise denies having fever, headache, chills, chest pain, trouble breathing, shortness of breath, productive cough, hemoptysis, back pain, abdominal pain, dysuria, focal numbness or rash.  She endorsed using Tylenol once.  She did report vomited twice several days prior but none since.  She is a former smoker.  No change in any your medication.  No history of cancer.  Past Medical History:  Diagnosis Date  . Arthritis   . Bilateral cataracts    Dr Kathrin Penner  . Hyperlipidemia   . Hypertension     Patient Active Problem List   Diagnosis Date Noted  . Prediabetes 02/05/2016  . History of colonic polyps 10/22/2014  . 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 Vitamin D deficiency 10/31/2012  . ECZEMA 03/03/2010  . 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 Hyperlipidemia 08/02/2007  . Essential hypertension 08/02/2007  . Osteopenia 08/02/2007    Past Surgical History:  Procedure Laterality Date  . COLONOSCOPY     Dr Fuller Plan  . 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 OVARIAN CYST REMOVAL       OB History   None      Home  Medications    Prior to Admission medications   Medication Sig Start Date End Date Taking? Authorizing Provider  acetaminophen (TYLENOL) 325 MG tablet Take 2 tablets (650 mg total) by mouth every 6 (six) hours as needed. 09/09/17   Binnie Rail, MD  Docusate Calcium (STOOL SOFTENER PO) Take by mouth. Takes 2 to 3 tablets daily    [provider]  fluocinonide cream (LIDEX) 0.05 % fluocinonide 0.05 % topical cream    [provider]  hydrochlorothiazide (HYDRODIURIL) 25 MG tablet TAKE (1/2) TABLET DAILY. 09/03/17   Binnie Rail, MD  metoprolol tartrate (LOPRESSOR) 100 MG tablet TAKE (1/2) TABLET TWICE DAILY. 04/20/17   Binnie Rail, MD  moxifloxacin (VIGAMOX) 0.5 % ophthalmic solution Vigamox 0.5 % eye drops    [provider]  Multiple Vitamin (MULTIVITAMIN) tablet Take 1 tablet by mouth daily.    [provider]  prednisoLONE acetate (PRED FORTE) 1 % ophthalmic suspension prednisolone acetate 1 % eye drops,suspension    [provider]  simvastatin (ZOCOR) 40 MG tablet TAKE 1 TABLET EACH DAY. 04/20/17   Binnie Rail, MD    Family History Family History  Adopted: Yes  Problem Relation Age of Onset  . Colon cancer Neg Hx   . Stomach cancer Neg Hx     Social History Social History   Tobacco Use  . Smoking status:  Former Smoker    Last attempt to quit: 08/27/1984    Years since quitting: 33.3  . Smokeless tobacco: Never Used  . Tobacco comment: smoked age [age], up to 1.5-2 ppd  Substance Use Topics  . Alcohol use: Yes    Alcohol/week: 3.0 standard drinks    Types: 3 Cans of beer per week    Comment: 3 x weekly  . Drug use: No     Allergies   Ibuprofen   Review of Systems Review of Systems  All other systems reviewed and are negative.    Physical Exam Updated Vital Signs BP 117/81 (BP Location: Right Arm)   Pulse 100   Temp (!) 100.4 F (38 C) (Oral)   Resp (!) 24   SpO2 95%   Physical Exam  Constitutional: She  is oriented to person, place, and time. She appears well-developed and well-nourished. No distress.  HENT:  Head: Atraumatic.  Mouth/Throat: Oropharynx is clear and moist.  Eyes: Conjunctivae are normal.  Neck: Normal range of motion. Neck supple.  No nuchal rigidity  Cardiovascular:  Tachycardia with irregularly irregular heart rhythm without murmur rubs or gallops.  Pulmonary/Chest: She has rales (Crackles heard at the left lower lung base).  Abdominal: Soft. She exhibits no distension. There is no tenderness.  Musculoskeletal: She exhibits no edema.  Neurological: She is alert and oriented to person, place, and time.  Skin: No rash noted.  Psychiatric: She has a normal mood and affect.  Nursing note and vitals reviewed.    ED Treatments / Results  Labs (all labs ordered are listed, but only abnormal results are displayed) Labs Reviewed  CULTURE, BLOOD (ROUTINE X 2)  CULTURE, BLOOD (ROUTINE X 2)  TROPONIN I  URINALYSIS, ROUTINE W REFLEX MICROSCOPIC  I-STAT CG4 LACTIC ACID, ED  CG4 I-STAT (LACTIC ACID)  I-STAT CG4 LACTIC ACID, ED  CG4 I-STAT (LACTIC ACID)  I-STAT CG4 LACTIC ACID, ED  I-STAT CG4 LACTIC ACID, ED   WBC 4.0 - 10.5 K/uL 21.8 Repeated and verified X2.High Panic    RBC 3.87 - 5.11 Mil/uL 4.88   Hemoglobin 12.0 - 15.0 g/dL 15.1High    HCT 36.0 - 46.0 % 43.9   MCV 78.0 - 100.0 fl 90.0   MCHC 30.0 - 36.0 g/dL 34.4   RDW 11.5 - 15.5 % 12.7   Platelets 150.0 - 400.0 K/uL 284.0   Neutrophils Relative % 43.0 - 77.0 % 87.3High    Comment: A Manual Differential was performed and is consistent with the Automated Differential.  Lymphocytes Relative 12.0 - 46.0 % 5.4Low    Monocytes Relative 3.0 - 12.0 % 6.7   Eosinophils Relative 0.0 - 5.0 % 0.0   Basophils Relative 0.0 - 3.0 % 0.6   Neutro Abs 1.4 - 7.7 K/uL 19.0High    Lymphs Abs 0.7 - 4.0 K/uL 1.2   Monocytes Absolute 0.1 - 1.0 K/uL 1.5High    Eosinophils Absolute 0.0 - 0.7 K/uL 0.0   Basophils Absolute 0.0 -  0.1 K/uL 0.1   Resulting Agency  [facility]    Ref Range & Units 11:35  Sodium 135 - 145 mEq/L 136   Potassium 3.5 - 5.1 mEq/L 4.2   Chloride 96 - 112 mEq/L 97   CO2 19 - 32 mEq/L 30   Glucose, Bld 70 - 99 mg/dL 152High    BUN 6 - 23 mg/dL 25High    Creatinine, Ser 0.40 - 1.20 mg/dL 1.56High    Total Bilirubin 0.2 - 1.2 mg/dL  1.6High    Alkaline Phosphatase 39 - 117 U/L 60   AST 0 - 37 U/L 19   ALT 0 - 35 U/L 10   Total Protein 6.0 - 8.3 g/dL 8.3   Albumin 3.5 - 5.2 g/dL 4.1   Calcium 8.4 - 10.5 mg/dL 10.2   GFR >60.00 mL/min 34.54Low      EKG EKG Interpretation  Date/Time:  Friday December 10 2017 15:07:27 EDT Ventricular Rate:  134 PR Interval:    QRS Duration: 94 QT Interval:  272 QTC Calculation: 406 R Axis:   13 Text Interpretation:  atrial flutter? Ventricular premature complex Borderline low voltage, extremity leads Borderline repolarization abnormality No previous tracing Confirmed by Dorie Rank [phone]) on 12/10/2017 3:52:00 PM   Radiology Dg Chest 2 View  Result Date: 12/10/2017 CLINICAL DATA:  Vomiting EXAM: CHEST - 2 VIEW COMPARISON:  None. FINDINGS: There are airspace opacities towards the base of the left lower lobe. Lungs otherwise clear. Heart is normal in size. No pneumothorax or pleural effusion. IMPRESSION: Left lower lobe pneumonia. Followup PA and lateral chest X-ray is recommended in 3-4 weeks following trial of antibiotic therapy to ensure resolution and exclude underlying malignancy. Electronically Signed   By: Marybelle Killings M.D.   On: 12/10/2017 14:45    Procedures .Critical Care Performed by: Domenic Moras, PA-C Authorized by: Domenic Moras, PA-C   Critical care provider statement:    Critical care time (minutes):  45   Critical care was time spent personally by me on the following activities:  Discussions with consultants, evaluation of patient's response to treatment, examination of patient, ordering and performing treatments and interventions,  ordering and review of laboratory studies, ordering and review of radiographic studies, pulse oximetry, re-evaluation of patient's condition, obtaining history from patient or surrogate and review of old charts   (including critical care time)  Medications Ordered in ED Medications  sodium chloride 0.9 % bolus 1,000 mL (0 mLs Intravenous Stopped 12/10/17 1653)    And  sodium chloride 0.9 % bolus 1,000 mL (1,000 mLs Intravenous New Bag/Given 12/10/17 1641)    And  sodium chloride 0.9 % bolus 250 mL (has no administration in time range)  cefTRIAXone (ROCEPHIN) 2 g in sodium chloride 0.9 % 100 mL IVPB (0 g Intravenous Stopped 12/10/17 1654)  azithromycin (ZITHROMAX) 500 mg in sodium chloride 0.9 % 250 mL IVPB (500 mg Intravenous New Bag/Given 12/10/17 1640)  verapamil (ISOPTIN) injection 2.5 mg (has no administration in time range)  acetaminophen (TYLENOL) tablet 650 mg (650 mg Oral Given 12/10/17 1705)     Initial Impression / Assessment and Plan / ED Course  I have reviewed the triage vital signs and the nursing notes.  Pertinent labs & imaging results that were available during my care of the patient were reviewed by me and considered in my medical decision making (see chart for details).     BP 113/72   Pulse (!) 105   Temp (!) 100.4 F (38 C) (Oral)   Resp (!) 23   SpO2 97%    Final Clinical Impressions(s) / ED Diagnoses   Final diagnoses:  Pneumonia of left lower lobe due to infectious organism ([place])  New onset a-fib [facility])    ED Discharge Orders    None     3:25 PM Patient sent here from PCP office for generalized fatigue as well as elevated white count.  She was found to have a low-grade temperature of 100.4, tachycardia with heart rate in the 130s  140s, elevated respiratory rate of 24, and a chest x-ray showing left lower lobe pneumonia.  Her white count is 21 from PCPs office.  Patient meets sepsis criteria.  Will initiate IV fluid, antibiotic, and fluid resuscitation at 30  mL/kg.  EKG showed A. fib with RVR.  No prior history of A. fib.  Patient mention her heart rate was normal this morning.  She denies any heart palpitation or chest pain.  Patient will benefit from a diltiazem drip and bolus.  5:06 PM After receive IV fluid, patient still showing evidence of A. fib with RVR with heart rate in the 130s 150s.  Patient given verapamil via IV.  Her chads2vasc score is 3 which put her at high risk of stroke/TIA.  Care discussed with Dr. Hillard Danker.  Will consult for admission.    5:33 PM Appreciate consultation from [facility] DR. Sarajane Jews who agrees to see and admit pt for further management of her condition. Sepsis reassessment done, pt resting comfortably and aware/agrees with plan.    Domenic Moras, PA-C 12/10/17 1734    Dorie Rank, MD 12/10/17 [phone]

## 2017-12-11 ENCOUNTER — Inpatient Hospital Stay (HOSPITAL_COMMUNITY): Payer: Medicare Other

## 2017-12-11 DIAGNOSIS — I4891 Unspecified atrial fibrillation: Secondary | ICD-10-CM

## 2017-12-11 DIAGNOSIS — E876 Hypokalemia: Secondary | ICD-10-CM

## 2017-12-11 DIAGNOSIS — I10 Essential (primary) hypertension: Secondary | ICD-10-CM

## 2017-12-11 DIAGNOSIS — R6520 Severe sepsis without septic shock: Secondary | ICD-10-CM

## 2017-12-11 DIAGNOSIS — E785 Hyperlipidemia, unspecified: Secondary | ICD-10-CM

## 2017-12-11 LAB — EXPECTORATED SPUTUM ASSESSMENT W GRAM STAIN, RFLX TO RESP C

## 2017-12-11 LAB — MAGNESIUM: MAGNESIUM: 1.9 mg/dL (ref 1.7–2.4)

## 2017-12-11 LAB — CBC
HCT: 40.7 % (ref 36.0–46.0)
Hemoglobin: 13.5 g/dL (ref 12.0–15.0)
MCH: 30.6 pg (ref 26.0–34.0)
MCHC: 33.2 g/dL (ref 30.0–36.0)
MCV: 92.3 fL (ref 78.0–100.0)
PLATELETS: 275 10*3/uL (ref 150–400)
RBC: 4.41 MIL/uL (ref 3.87–5.11)
RDW: 13 % (ref 11.5–15.5)
WBC: 13.6 10*3/uL — ABNORMAL HIGH (ref 4.0–10.5)

## 2017-12-11 LAB — POTASSIUM: POTASSIUM: 3.2 mmol/L — AB (ref 3.5–5.1)

## 2017-12-11 LAB — MRSA PCR SCREENING: MRSA by PCR: NEGATIVE

## 2017-12-11 LAB — BASIC METABOLIC PANEL
Anion gap: 12 (ref 5–15)
BUN: 19 mg/dL (ref 8–23)
CHLORIDE: 106 mmol/L (ref 98–111)
CO2: 23 mmol/L (ref 22–32)
CREATININE: 1.06 mg/dL — AB (ref 0.44–1.00)
Calcium: 8.4 mg/dL — ABNORMAL LOW (ref 8.9–10.3)
GFR calc non Af Amer: 51 mL/min — ABNORMAL LOW (ref >60)
GFR, EST AFRICAN AMERICAN: 59 mL/min — AB (ref >60)
GLUCOSE: 117 mg/dL — AB (ref 70–99)
Potassium: 2.7 mmol/L — CL (ref 3.5–5.1)
Sodium: 141 mmol/L (ref 135–145)

## 2017-12-11 LAB — EXPECTORATED SPUTUM ASSESSMENT W REFEX TO RESP CULTURE

## 2017-12-11 LAB — ECHOCARDIOGRAM COMPLETE
Height: 66 in
Weight: 2659.63 oz

## 2017-12-11 LAB — HIV ANTIBODY (ROUTINE TESTING W REFLEX): HIV SCREEN 4TH GENERATION: NONREACTIVE

## 2017-12-11 LAB — TSH: TSH: 3.1 u[IU]/mL (ref 0.350–4.500)

## 2017-12-11 LAB — STREP PNEUMONIAE URINARY ANTIGEN: Strep Pneumo Urinary Antigen: NEGATIVE

## 2017-12-11 MED ORDER — POTASSIUM CHLORIDE CRYS ER 20 MEQ PO TBCR
40.0000 meq | EXTENDED_RELEASE_TABLET | Freq: Once | ORAL | Status: AC
  Administered 2017-12-11: 40 meq via ORAL
  Filled 2017-12-11: qty 2

## 2017-12-11 MED ORDER — AZITHROMYCIN 250 MG PO TABS
500.0000 mg | ORAL_TABLET | Freq: Every day | ORAL | Status: DC
  Administered 2017-12-11 – 2017-12-14 (×4): 500 mg via ORAL
  Filled 2017-12-11 (×4): qty 2

## 2017-12-11 MED ORDER — POTASSIUM CHLORIDE CRYS ER 20 MEQ PO TBCR
40.0000 meq | EXTENDED_RELEASE_TABLET | ORAL | Status: AC
  Administered 2017-12-11 (×2): 40 meq via ORAL
  Filled 2017-12-11 (×2): qty 2

## 2017-12-11 MED ORDER — ATORVASTATIN CALCIUM 10 MG PO TABS
20.0000 mg | ORAL_TABLET | Freq: Every day | ORAL | Status: DC
  Administered 2017-12-11 – 2017-12-13 (×3): 20 mg via ORAL
  Filled 2017-12-11 (×3): qty 2

## 2017-12-11 MED ORDER — POTASSIUM CHLORIDE CRYS ER 10 MEQ PO TBCR: 10.0000 meq | EXTENDED_RELEASE_TABLET | Freq: Two times a day (BID) | ORAL | Status: DC

## 2017-12-11 MED ORDER — DILTIAZEM HCL-DEXTROSE 100-5 MG/100ML-% IV SOLN (PREMIX)
5.0000 mg/h | INTRAVENOUS | Status: DC
  Administered 2017-12-11 (×2): 5 mg/h via INTRAVENOUS
  Administered 2017-12-12 – 2017-12-13 (×3): 10 mg/h via INTRAVENOUS
  Administered 2017-12-13 – 2017-12-14 (×2): 5 mg/h via INTRAVENOUS
  Administered 2017-12-14: 7.5 mg/h via INTRAVENOUS
  Filled 2017-12-11 (×9): qty 100

## 2017-12-11 MED ORDER — DILTIAZEM LOAD VIA INFUSION
20.0000 mg | Freq: Once | INTRAVENOUS | Status: AC
  Administered 2017-12-11: 20 mg via INTRAVENOUS
  Filled 2017-12-11: qty 20

## 2017-12-11 NOTE — Progress Notes (Signed)
CRITICAL VALUE ALERT  Critical Value:  K 2.7  Date & Time Notied:  12/11/2017 5:38 AM   Provider Notified: K Schorr  Orders Received/Actions taken: None at the moment

## 2017-12-11 NOTE — Progress Notes (Signed)
PROGRESS NOTE    Sharon Knight  OJJ:[id number] DOB: 1946-03-21 DOA: 12/10/2017 PCP: Binnie Rail, MD   Brief Narrative:  HPI on 12/10/2017 by Dr. Murray Hodgkins 72 year old woman PMH hypertension, hyperlipidemia, presented to the emergency department at the direction of her primary care physician for elevated WBC.  Was found to have lobar pneumonia, acute kidney injury, atrial fibrillation with rapid ventricular response but did not meet sepsis criteria.  She reports she is felt poorly for approximately a week, extreme fatigue, hardly walking, very poor appetite, did not eat for the last 3 days other than one meal.  She said no cough no fever, no systemic symptoms or shortness of breath.  No other associated symptoms.  No palpitations or chest pain.  Interim history Found to have sepsis secondary to pneumonia along with new onset AF RVR.  Assessment & Plan   Severe sepsis secondary to pneumonia -Upon admission patient was noted to have atrial fibrillation with RVR (tachycardia), leukocytosis of 21.8, setting of acute kidney injury and pneumonia.  Overnight she developed fever of 100.4 F along with tachypnea.  -Leukocytosis has improved to 13 -Chest x-ray reviewed, showed left lower lobe pneumonia -Continue azithromycin and ceftriaxone -Blood cultures show no growth to date -Sputum culture pending  Atrial fibrillation with RVR -Possibly secondary to pneumonia -New onset -Potassium 2.7, will replace -TSH 3.1 (WNL) -Echocardiogram showed EF of 65 to 70%, no regional wall motion abnormalities no PFO or atrial septal defect identified -Given several doses of metoprolol on admission however continues to have heart rate in the 150s -Have placed patient on Cardizem drip -Continue full dose Lovenox -CHADSVASC 3 given age, gender, hypertension  Acute kidney injury -Creatinine upon admission 1.56 -Baseline creatinine approximately 1.06-1.08 -Placed on IV fluids -Creatinine currently  1.06 -Continue to monitor BMP  Hypokalemia -Potassium 2.7, will replace and repeat later this afternoon -Continue to monitor BMP -Magnesium 1.9  Essential hypertension -Discontinued metoprolol, will place on Cardizem  Hyperlipidemia -Patient normally on simvastatin at home however given the patient we will on Cardizem will transition to atorvastatin 20 mg to avoid drug interactions.   DVT Prophylaxis  Lovenox   Code Status: Full  Family Communication: None at bedside  Disposition Plan: Admitted. Suspect discharge to home in 24-48 hours pending improvement in HR.  Consultants None  Procedures  Echocardiogram  Antibiotics   Anti-infectives (From admission, onward)   Start     Dose/Rate Route Frequency Ordered Stop   12/11/17 1800  azithromycin (ZITHROMAX) tablet 500 mg     500 mg Oral Daily-1800 12/11/17 1106 12/17/17 1759   12/11/17 1700  azithromycin (ZITHROMAX) 500 mg in sodium chloride 0.9 % 250 mL IVPB  Status:  Discontinued     500 mg 250 mL/hr over 60 Minutes Intravenous Every 24 hours 12/10/17 1939 12/11/17 1106   12/11/17 1600  cefTRIAXone (ROCEPHIN) 1 g in sodium chloride 0.9 % 100 mL IVPB     1 g 200 mL/hr over 30 Minutes Intravenous Every 24 hours 12/10/17 1939 12/18/17 1559   12/10/17 1530  cefTRIAXone (ROCEPHIN) 2 g in sodium chloride 0.9 % 100 mL IVPB  Status:  Discontinued     2 g 200 mL/hr over 30 Minutes Intravenous Every 24 hours 12/10/17 1518 12/10/17 1939   12/10/17 1530  azithromycin (ZITHROMAX) 500 mg in sodium chloride 0.9 % 250 mL IVPB  Status:  Discontinued     500 mg 250 mL/hr over 60 Minutes Intravenous Every 24 hours 12/10/17 1518 12/10/17 1939  Subjective:   Lee Kalt seen and examined today.  Patient states she is feeling better and is hoping to home. She denies current chest pain, shortness of breath, abdominal pain, N/V/D/C.   Objective:   Vitals:   12/11/17 1200 12/11/17 1215 12/11/17 1230 12/11/17 1245  BP: 121/69  133/64 (!) 128/101 114/64  Pulse:   65   Resp: (!) 29 (!) 27 (!) 26 (!) 26  Temp: 98 F (36.7 C)     TempSrc: Oral     SpO2:   96%   Weight:      Height:        Intake/Output Summary (Last 24 hours) at 12/11/2017 1254 Last data filed at 12/11/2017 1200 Gross per 24 hour  Intake 3404.17 ml  Output 1300 ml  Net 2104.17 ml   Filed Weights   12/10/17 2100  Weight: 75.4 kg    Exam  General: Well developed, well nourished, NAD, appears stated age  44: NCAT, mucous membranes moist.   Neck: Supple  Cardiovascular: S1 S2 auscultated, irregularly irregular  Respiratory: Clear to auscultation bilaterally with equal chest rise  Abdomen: Soft, nontender, nondistended, + bowel sounds  Extremities: warm dry without cyanosis clubbing or edema  Neuro: AAOx3, nonfocal  Skin: Without rashes exudates or nodules  Psych: Normal affect and demeanor with intact judgement and insight, pleasant   Data Reviewed: I have personally reviewed following labs and imaging studies  CBC: Recent Labs  Lab 12/10/17 1135 12/11/17 0354  WBC 21.8 Repeated and verified X2.* 13.6*  NEUTROABS 19.0*  --   HGB 15.1* 13.5  HCT 43.9 40.7  MCV 90.0 92.3  PLT 284.0 681   Basic Metabolic Panel: Recent Labs  Lab 12/10/17 1135 12/11/17 0354 12/11/17 0730 12/11/17 1138  NA 136 141  --   --   K 4.2 2.7*  --  3.2*  CL 97 106  --   --   CO2 30 23  --   --   GLUCOSE 152* 117*  --   --   BUN 25* 19  --   --   CREATININE 1.56* 1.06*  --   --   CALCIUM 10.2 8.4*  --   --   MG  --   --  1.9  --    GFR: Estimated Creatinine Clearance: 49 mL/min (A) (by C-G formula based on SCr of 1.06 mg/dL (H)). Liver Function Tests: Recent Labs  Lab 12/10/17 1135  AST 19  ALT 10  ALKPHOS 60  BILITOT 1.6*  PROT 8.3  ALBUMIN 4.1   No results for input(s): LIPASE, AMYLASE in the last 168 hours. No results for input(s): AMMONIA in the last 168 hours. Coagulation Profile: No results for input(s): INR,  PROTIME in the last 168 hours. Cardiac Enzymes: Recent Labs  Lab 12/10/17 1539  TROPONINI <0.03   BNP (last 3 results) No results for input(s): PROBNP in the last 8760 hours. HbA1C: No results for input(s): HGBA1C in the last 72 hours. CBG: No results for input(s): GLUCAP in the last 168 hours. Lipid Profile: No results for input(s): CHOL, HDL, LDLCALC, TRIG, CHOLHDL, LDLDIRECT in the last 72 hours. Thyroid Function Tests: Recent Labs    12/11/17 0354  TSH 3.100   Anemia Panel: No results for input(s): VITAMINB12, FOLATE, FERRITIN, TIBC, IRON, RETICCTPCT in the last 72 hours. Urine analysis:    Component Value Date/Time   COLORURINE YELLOW 12/10/2017 1730   APPEARANCEUR CLEAR 12/10/2017 1730   LABSPEC 1.005 12/10/2017 1730  PHURINE 6.0 12/10/2017 1730   GLUCOSEU NEGATIVE 12/10/2017 1730   GLUCOSEU NEGATIVE 12/10/2017 1146   HGBUR SMALL (A) 12/10/2017 1730   BILIRUBINUR NEGATIVE 12/10/2017 1730   KETONESUR NEGATIVE 12/10/2017 1730   PROTEINUR NEGATIVE 12/10/2017 1730   UROBILINOGEN 1.0 12/10/2017 1146   NITRITE NEGATIVE 12/10/2017 1730   LEUKOCYTESUR SMALL (A) 12/10/2017 1730   Sepsis Labs: @LABRCNTIP (procalcitonin:4,lacticidven:4)  ) Recent Results (from the past 240 hour(s))  Blood Culture (routine x 2)     Status: None (Preliminary result)   Collection Time: 12/10/17  3:39 PM  Result Value Ref Range Status   Specimen Description   Final    BLOOD RIGHT ANTECUBITAL Performed at [facility], [place] [address], [place], [place] [address]    Special Requests   Final    BOTTLES DRAWN AEROBIC AND ANAEROBIC Blood Culture adequate volume Performed at [place] [address] [place], [place] [address]    Culture   Final    NO GROWTH < 24 HOURS Performed at [place] [address], [place], [place] [address]    Report Status PENDING  Incomplete  Blood Culture (routine x 2)     Status: None (Preliminary result)     Collection Time: 12/10/17  3:39 PM  Result Value Ref Range Status   Specimen Description   Final    BLOOD RIGHT ANTECUBITAL Performed at [name] [address] [place], [place] [address]    Special Requests   Final    BOTTLES DRAWN AEROBIC AND ANAEROBIC Blood Culture adequate volume Performed at [facility] [address] [place], [place] [address]    Culture   Final    NO GROWTH < 24 HOURS Performed at [place] [address], [place], [place] [address]    Report Status PENDING  Incomplete  MRSA PCR Screening     Status: None   Collection Time: 12/11/17  2:47 AM  Result Value Ref Range Status   MRSA by PCR NEGATIVE NEGATIVE Final    Comment:        The GeneXpert MRSA Assay (FDA approved for NASAL specimens only), is one component of a comprehensive MRSA colonization surveillance program. It is not intended to diagnose MRSA infection nor to guide or monitor treatment for MRSA infections. Performed at [facility], [place] [address], [place], [place] [address]   Culture, sputum-assessment     Status: None   Collection Time: 12/11/17  6:26 AM  Result Value Ref Range Status   Specimen Description SPUTUM  Final   Special Requests NONE  Final   Sputum evaluation   Final    THIS SPECIMEN IS ACCEPTABLE FOR SPUTUM CULTURE Performed at [facility], [place] [address] [place], [place] [address]    Report Status 12/11/2017 FINAL  Final      Radiology Studies: Dg Chest 2 View  Result Date: 12/10/2017 CLINICAL DATA:  Vomiting EXAM: CHEST - 2 VIEW COMPARISON:  None. FINDINGS: There are airspace opacities towards the base of the left lower lobe. Lungs otherwise clear. Heart is normal in size. No pneumothorax or pleural effusion. IMPRESSION: Left lower lobe pneumonia. Followup PA and lateral chest X-ray is recommended in 3-4 weeks following trial of antibiotic therapy to ensure resolution and  exclude underlying malignancy. Electronically Signed   By: Marybelle Killings M.D.   On: 12/10/2017 14:45     Scheduled Meds: . atorvastatin  20 mg Oral  q1800  . azithromycin  500 mg Oral q1800  . enoxaparin (LOVENOX) injection  1 mg/kg Subcutaneous Q12H  . sodium chloride flush  3 mL Intravenous Q12H   Continuous Infusions: . sodium chloride 150 mL/hr at 12/11/17 1200  . cefTRIAXone (ROCEPHIN)  IV    . diltiazem (CARDIZEM) infusion 5 mg/hr (12/11/17 1200)     LOS: 1 day   Time Spent in minutes   45 minutes  Jazia Faraci D.O. on 12/11/2017 at 12:54 PM  Between 7am to 7pm - Pager - [phone]  After 7pm go to [email or web address] - password [email or web address]  And look for the night coverage person covering for me after hours  [facility] Office  [phone]

## 2017-12-11 NOTE — Progress Notes (Signed)
PHARMACIST - PHYSICIAN COMMUNICATION CONCERNING: Antibiotic IV to Oral Route Change Policy  RECOMMENDATION: This patient is receiving azithromycin by the intravenous route.  Based on criteria approved by the Pharmacy and Therapeutics Committee, the antibiotic(s) is/are being converted to the equivalent oral dose form(s).   DESCRIPTION: These criteria include:  Patient being treated for a respiratory tract infection, urinary tract infection, cellulitis or clostridium difficile associated diarrhea if on metronidazole  The patient is not neutropenic and does not exhibit a GI malabsorption state  The patient is eating (either orally or via tube) and/or has been taking other orally administered medications for a least 24 hours  The patient is improving clinically and has a Tmax < 100.5  If you have questions about this conversion, please contact the Pharmacy Department  []   [phone] )  [facility] []   [phone] )  [facility] []   [phone] )  [facility] []   [phone] )  [facility] [x]   [phone] )  [place], [place].D [phone] 12/11/2017 11:06 AM

## 2017-12-11 NOTE — Progress Notes (Signed)
Paged MD Mikhail about Pt's HR which is in the 130's, the pt does not have any s/s, no c/o chest pain or discomfort. Order given to start Cardizem drip at this time. Report given to [facility] who will resume care.

## 2017-12-11 NOTE — Progress Notes (Signed)
  Echocardiogram 2D Echocardiogram has been performed.  Roseanna Rainbow R 12/11/2017, 8:30 AM

## 2017-12-12 ENCOUNTER — Inpatient Hospital Stay (HOSPITAL_COMMUNITY): Payer: Medicare Other

## 2017-12-12 LAB — BASIC METABOLIC PANEL
Anion gap: 6 (ref 5–15)
BUN: 12 mg/dL (ref 8–23)
CHLORIDE: 116 mmol/L — AB (ref 98–111)
CO2: 21 mmol/L — AB (ref 22–32)
CREATININE: 0.75 mg/dL (ref 0.44–1.00)
Calcium: 8.1 mg/dL — ABNORMAL LOW (ref 8.9–10.3)
GFR calc Af Amer: >60 mL/min (ref >60)
GFR calc non Af Amer: >60 mL/min (ref >60)
GLUCOSE: 137 mg/dL — AB (ref 70–99)
POTASSIUM: 3.6 mmol/L (ref 3.5–5.1)
Sodium: 143 mmol/L (ref 135–145)

## 2017-12-12 LAB — CBC
HEMATOCRIT: 35.8 % — AB (ref 36.0–46.0)
Hemoglobin: 11.7 g/dL — ABNORMAL LOW (ref 12.0–15.0)
MCH: 30.5 pg (ref 26.0–34.0)
MCHC: 32.7 g/dL (ref 30.0–36.0)
MCV: 93.5 fL (ref 78.0–100.0)
Platelets: 260 10*3/uL (ref 150–400)
RBC: 3.83 MIL/uL — ABNORMAL LOW (ref 3.87–5.11)
RDW: 13.1 % (ref 11.5–15.5)
WBC: 10 10*3/uL (ref 4.0–10.5)

## 2017-12-12 LAB — MAGNESIUM: Magnesium: 1.9 mg/dL (ref 1.7–2.4)

## 2017-12-12 MED ORDER — POTASSIUM CHLORIDE CRYS ER 20 MEQ PO TBCR
40.0000 meq | EXTENDED_RELEASE_TABLET | Freq: Once | ORAL | Status: AC
  Administered 2017-12-12: 40 meq via ORAL
  Filled 2017-12-12: qty 2

## 2017-12-12 MED ORDER — METOPROLOL TARTRATE 25 MG PO TABS
50.0000 mg | ORAL_TABLET | Freq: Two times a day (BID) | ORAL | Status: DC
  Administered 2017-12-12 – 2017-12-13 (×3): 50 mg via ORAL
  Filled 2017-12-12 (×3): qty 2

## 2017-12-12 MED ORDER — FUROSEMIDE 10 MG/ML IJ SOLN
20.0000 mg | Freq: Once | INTRAMUSCULAR | Status: AC
  Administered 2017-12-12: 20 mg via INTRAVENOUS
  Filled 2017-12-12: qty 2

## 2017-12-12 NOTE — Progress Notes (Signed)
PROGRESS NOTE    Sharon Knight  OJJ:[id number] DOB: 1945/06/02 DOA: 12/10/2017 PCP: Binnie Rail, MD   Brief Narrative:  HPI on 12/10/2017 by Dr. Murray Hodgkins 72 year old woman PMH hypertension, hyperlipidemia, presented to the emergency department at the direction of her primary care physician for elevated WBC.  Was found to have lobar pneumonia, acute kidney injury, atrial fibrillation with rapid ventricular response but did not meet sepsis criteria.  She reports she is felt poorly for approximately a week, extreme fatigue, hardly walking, very poor appetite, did not eat for the last 3 days other than one meal.  She said no cough no fever, no systemic symptoms or shortness of breath.  No other associated symptoms.  No palpitations or chest pain.  Interim history Found to have sepsis secondary to pneumonia along with new onset AF RVR, started on cardizem.  Assessment & Plan   Severe sepsis secondary to pneumonia -Upon admission patient was noted to have atrial fibrillation with RVR (tachycardia), leukocytosis of 21.8, setting of acute kidney injury and pneumonia.  Overnight she developed fever of 100.4 F along with tachypnea.  -Leukocytosis has improved to 10 -Chest x-ray reviewed, showed left lower lobe pneumonia -Continue azithromycin and ceftriaxone -Blood cultures show no growth to date -Sputum culture rare GNR/GPR, few GPC  Atrial fibrillation with RVR -Possibly secondary to pneumonia -New onset -Potassium 3.6 (will continue to replace) -TSH 3.1 (WNL) -Echocardiogram showed EF of 65 to 70%, no regional wall motion abnormalities no PFO or atrial septal defect identified -Given several doses of metoprolol on admission however continues to have heart rate in the 150s -Have placed patient on Cardizem drip, rate has mildly improved -will restart metoprolol  -Continue full dose Lovenox -CHADSVASC 3 given age, gender, hypertension  Acute kidney injury -Creatinine upon  admission 1.56 -Baseline creatinine approximately 1.06-1.08 -Placed on IV fluids -Creatinine currently 0.75 -Continue to monitor BMP  Hypokalemia -Potassium 3.6, will continue to replace, goal of 4 -Continue to monitor BMP -Magnesium 1.9  Essential hypertension -placed on cardizem -restart metoprolol if BP can tolerate  Hyperlipidemia -Patient normally on simvastatin at home however given the patient we will on Cardizem will transitioned to atorvastatin 20 mg to avoid drug interactions.  DVT Prophylaxis  Lovenox   Code Status: Full  Family Communication: None at bedside  Disposition Plan: Admitted. Suspect discharge to home in 24-48 hours pending improvement in HR.  Consultants None  Procedures  Echocardiogram  Antibiotics   Anti-infectives (From admission, onward)   Start     Dose/Rate Route Frequency Ordered Stop   12/11/17 1800  azithromycin (ZITHROMAX) tablet 500 mg     500 mg Oral Daily-1800 12/11/17 1106 12/17/17 1759   12/11/17 1700  azithromycin (ZITHROMAX) 500 mg in sodium chloride 0.9 % 250 mL IVPB  Status:  Discontinued     500 mg 250 mL/hr over 60 Minutes Intravenous Every 24 hours 12/10/17 1939 12/11/17 1106   12/11/17 1600  cefTRIAXone (ROCEPHIN) 1 g in sodium chloride 0.9 % 100 mL IVPB     1 g 200 mL/hr over 30 Minutes Intravenous Every 24 hours 12/10/17 1939 12/18/17 1559   12/10/17 1530  cefTRIAXone (ROCEPHIN) 2 g in sodium chloride 0.9 % 100 mL IVPB  Status:  Discontinued     2 g 200 mL/hr over 30 Minutes Intravenous Every 24 hours 12/10/17 1518 12/10/17 1939   12/10/17 1530  azithromycin (ZITHROMAX) 500 mg in sodium chloride 0.9 % 250 mL IVPB  Status:  Discontinued  500 mg 250 mL/hr over 60 Minutes Intravenous Every 24 hours 12/10/17 1518 12/10/17 1939      Subjective:   Anzlee Hinesley seen and examined today.  Patient states she is feeling better and is hoping to home. She denies current chest pain, shortness of breath, abdominal pain,  N/V/D/C.   Objective:   Vitals:   12/12/17 0400 12/12/17 0500 12/12/17 0600 12/12/17 0700  BP: 92/62 127/90 (!) 165/65 (!) 146/87  Pulse: 99 (!) 53 (!) 129 (!) 111  Resp: (!) 24 (!) 32 19 20  Temp: 98.7 F (37.1 C)     TempSrc: Oral     SpO2: 95% 96% 96% 96%  Weight:      Height:        Intake/Output Summary (Last 24 hours) at 12/12/2017 0933 Last data filed at 12/12/2017 1610 Gross per 24 hour  Intake 3318.2 ml  Output -  Net 3318.2 ml   Filed Weights   12/10/17 2100  Weight: 75.4 kg   Exam  General: Well developed, well nourished, NAD, appears stated age  11: NCAT, mucous membranes moist.   Neck: Supple  Cardiovascular: S1 S2 auscultated, irregularly irregular, no murmur  Respiratory: Clear to auscultation bilaterally with equal chest rise  Abdomen: Soft, nontender, nondistended, + bowel sounds  Extremities: warm dry without cyanosis clubbing or edema  Neuro: AAOx3, nonfocal  Psych: Normal affect and demeanor, pleasant  Data Reviewed: I have personally reviewed following labs and imaging studies  CBC: Recent Labs  Lab 12/10/17 1135 12/11/17 0354 12/12/17 0350  WBC 21.8 Repeated and verified X2.* 13.6* 10.0  NEUTROABS 19.0*  --   --   HGB 15.1* 13.5 11.7*  HCT 43.9 40.7 35.8*  MCV 90.0 92.3 93.5  PLT 284.0 275 960   Basic Metabolic Panel: Recent Labs  Lab 12/10/17 1135 12/11/17 0354 12/11/17 0730 12/11/17 1138 12/12/17 0350  NA 136 141  --   --  143  K 4.2 2.7*  --  3.2* 3.6  CL 97 106  --   --  116*  CO2 30 23  --   --  21*  GLUCOSE 152* 117*  --   --  137*  BUN 25* 19  --   --  12  CREATININE 1.56* 1.06*  --   --  0.75  CALCIUM 10.2 8.4*  --   --  8.1*  MG  --   --  1.9  --   --    GFR: Estimated Creatinine Clearance: 65 mL/min (by C-G formula based on SCr of 0.75 mg/dL). Liver Function Tests: Recent Labs  Lab 12/10/17 1135  AST 19  ALT 10  ALKPHOS 60  BILITOT 1.6*  PROT 8.3  ALBUMIN 4.1   No results for input(s):  LIPASE, AMYLASE in the last 168 hours. No results for input(s): AMMONIA in the last 168 hours. Coagulation Profile: No results for input(s): INR, PROTIME in the last 168 hours. Cardiac Enzymes: Recent Labs  Lab 12/10/17 1539  TROPONINI <0.03   BNP (last 3 results) No results for input(s): PROBNP in the last 8760 hours. HbA1C: No results for input(s): HGBA1C in the last 72 hours. CBG: No results for input(s): GLUCAP in the last 168 hours. Lipid Profile: No results for input(s): CHOL, HDL, LDLCALC, TRIG, CHOLHDL, LDLDIRECT in the last 72 hours. Thyroid Function Tests: Recent Labs    12/11/17 0354  TSH 3.100   Anemia Panel: No results for input(s): VITAMINB12, FOLATE, FERRITIN, TIBC, IRON, RETICCTPCT in the last  72 hours. Urine analysis:    Component Value Date/Time   COLORURINE YELLOW 12/10/2017 1730   APPEARANCEUR CLEAR 12/10/2017 1730   LABSPEC 1.005 12/10/2017 1730   PHURINE 6.0 12/10/2017 1730   GLUCOSEU NEGATIVE 12/10/2017 1730   GLUCOSEU NEGATIVE 12/10/2017 1146   HGBUR SMALL (A) 12/10/2017 1730   BILIRUBINUR NEGATIVE 12/10/2017 1730   KETONESUR NEGATIVE 12/10/2017 1730   PROTEINUR NEGATIVE 12/10/2017 1730   UROBILINOGEN 1.0 12/10/2017 1146   NITRITE NEGATIVE 12/10/2017 1730   LEUKOCYTESUR SMALL (A) 12/10/2017 1730   Sepsis Labs: @LABRCNTIP (procalcitonin:4,lacticidven:4)  ) Recent Results (from the past 240 hour(s))  Blood Culture (routine x 2)     Status: None (Preliminary result)   Collection Time: 12/10/17  3:39 PM  Result Value Ref Range Status   Specimen Description   Final    BLOOD RIGHT ANTECUBITAL Performed at [facility], [place] [address], [place], [place] [address]    Special Requests   Final    BOTTLES DRAWN AEROBIC AND ANAEROBIC Blood Culture adequate volume Performed at [place] [address], [place], [place] [address]    Culture   Final    NO GROWTH < 24 HOURS Performed at [facility] [address], [place], [place] [address]    Report Status PENDING  Incomplete  Blood Culture (routine x 2)     Status: None (Preliminary result)   Collection Time: 12/10/17  3:39 PM  Result Value Ref Range Status   Specimen Description   Final    BLOOD RIGHT ANTECUBITAL Performed at [facility] [address] [place], [place] [address]    Special Requests   Final    BOTTLES DRAWN AEROBIC AND ANAEROBIC Blood Culture adequate volume Performed at [facility] [address] [place], [place] [address]    Culture   Final    NO GROWTH < 24 HOURS Performed at [place] [facility] [address] [place], [place] [address]    Report Status PENDING  Incomplete  MRSA PCR Screening     Status: None   Collection Time: 12/11/17  2:47 AM  Result Value Ref Range Status   MRSA by PCR NEGATIVE NEGATIVE Final    Comment:        The GeneXpert MRSA Assay (FDA approved for NASAL specimens only), is one component of a comprehensive MRSA colonization surveillance program. It is not intended to diagnose MRSA infection nor to guide or monitor treatment for MRSA infections. Performed at [facility] [place] [address], [place], [place] [address]   Culture, sputum-assessment     Status: None   Collection Time: 12/11/17  6:26 AM  Result Value Ref Range Status   Specimen Description SPUTUM  Final   Special Requests NONE  Final   Sputum evaluation   Final    THIS SPECIMEN IS ACCEPTABLE FOR SPUTUM CULTURE Performed at [facility], [place] [address] [place], [place] [address]    Report Status 12/11/2017 FINAL  Final  Culture, respiratory     Status: None (Preliminary result)   Collection Time: 12/11/17  6:26 AM  Result Value Ref Range Status   Specimen Description   Final    SPUTUM Performed at [place] [address], [place], [place] [address]    Special Requests   Final    NONE Reflexed from  [id number] Performed at [facility], [place] [address], [place], [place] [address]  Gram Stain   Final    FEW WBC PRESENT, PREDOMINANTLY PMN FEW GRAM POSITIVE COCCI IN CLUSTERS RARE GRAM NEGATIVE RODS RARE GRAM POSITIVE RODS Performed at [facility], [place] [address], [place], [place] [address]    Culture PENDING  Incomplete   Report Status PENDING  Incomplete      Radiology Studies: Dg Chest 2 View  Result Date: 12/10/2017 CLINICAL DATA:  Vomiting EXAM: CHEST - 2 VIEW COMPARISON:  None. FINDINGS: There are airspace opacities towards the base of the left lower lobe. Lungs otherwise clear. Heart is normal in size. No pneumothorax or pleural effusion. IMPRESSION: Left lower lobe pneumonia. Followup PA and lateral chest X-ray is recommended in 3-4 weeks following trial of antibiotic therapy to ensure resolution and exclude underlying malignancy. Electronically Signed   By: Marybelle Killings M.D.   On: 12/10/2017 14:45     Scheduled Meds: . atorvastatin  20 mg Oral q1800  . azithromycin  500 mg Oral q1800  . enoxaparin (LOVENOX) injection  1 mg/kg Subcutaneous Q12H  . metoprolol tartrate  50 mg Oral BID  . potassium chloride  40 mEq Oral Once  . sodium chloride flush  3 mL Intravenous Q12H   Continuous Infusions: . sodium chloride 150 mL/hr at 12/12/17 0516  . cefTRIAXone (ROCEPHIN)  IV Stopped (12/11/17 1715)  . diltiazem (CARDIZEM) infusion 10 mg/hr (12/12/17 3818)     LOS: 2 days   Time Spent in minutes   45 minutes  Jossiah Smoak D.O. on 12/12/2017 at 9:33 AM  Between 7am to 7pm - Pager - [phone]  After 7pm go to [email or web address] - password [email or web address]  And look for the night coverage person covering for me after hours  [facility] Office  [phone]

## 2017-12-13 ENCOUNTER — Telehealth: Payer: Self-pay | Admitting: Physician Assistant

## 2017-12-13 DIAGNOSIS — I4891 Unspecified atrial fibrillation: Secondary | ICD-10-CM

## 2017-12-13 DIAGNOSIS — J181 Lobar pneumonia, unspecified organism: Secondary | ICD-10-CM

## 2017-12-13 DIAGNOSIS — N179 Acute kidney failure, unspecified: Secondary | ICD-10-CM

## 2017-12-13 LAB — BASIC METABOLIC PANEL
ANION GAP: 8 (ref 5–15)
BUN: 10 mg/dL (ref 8–23)
CHLORIDE: 112 mmol/L — AB (ref 98–111)
CO2: 22 mmol/L (ref 22–32)
Calcium: 8.8 mg/dL — ABNORMAL LOW (ref 8.9–10.3)
Creatinine, Ser: 0.72 mg/dL (ref 0.44–1.00)
Glucose, Bld: 130 mg/dL — ABNORMAL HIGH (ref 70–99)
POTASSIUM: 3.8 mmol/L (ref 3.5–5.1)
SODIUM: 142 mmol/L (ref 135–145)

## 2017-12-13 LAB — CBC
HCT: 37.6 % (ref 36.0–46.0)
HEMOGLOBIN: 12.4 g/dL (ref 12.0–15.0)
MCH: 30.5 pg (ref 26.0–34.0)
MCHC: 33 g/dL (ref 30.0–36.0)
MCV: 92.4 fL (ref 78.0–100.0)
PLATELETS: 297 10*3/uL (ref 150–400)
RBC: 4.07 MIL/uL (ref 3.87–5.11)
RDW: 13.1 % (ref 11.5–15.5)
WBC: 10.5 10*3/uL (ref 4.0–10.5)

## 2017-12-13 MED ORDER — DILTIAZEM HCL ER COATED BEADS 120 MG PO CP24
240.0000 mg | ORAL_CAPSULE | Freq: Every day | ORAL | Status: DC
  Administered 2017-12-15 – 2017-12-16 (×2): 240 mg via ORAL
  Filled 2017-12-13 (×2): qty 2

## 2017-12-13 MED ORDER — DILTIAZEM HCL ER 60 MG PO CP12
120.0000 mg | ORAL_CAPSULE | Freq: Two times a day (BID) | ORAL | Status: AC
  Administered 2017-12-13: 120 mg via ORAL
  Filled 2017-12-13: qty 2

## 2017-12-13 MED ORDER — ALPRAZOLAM 0.25 MG PO TABS
0.2500 mg | ORAL_TABLET | Freq: Two times a day (BID) | ORAL | Status: DC | PRN
Start: 2017-12-13 — End: 2017-12-16

## 2017-12-13 MED ORDER — POTASSIUM CHLORIDE CRYS ER 20 MEQ PO TBCR
40.0000 meq | EXTENDED_RELEASE_TABLET | Freq: Once | ORAL | Status: AC
  Administered 2017-12-13: 40 meq via ORAL
  Filled 2017-12-13: qty 2

## 2017-12-13 MED ORDER — METOPROLOL TARTRATE 25 MG PO TABS
100.0000 mg | ORAL_TABLET | Freq: Two times a day (BID) | ORAL | Status: DC
  Administered 2017-12-13 – 2017-12-16 (×6): 100 mg via ORAL
  Filled 2017-12-13 (×6): qty 4

## 2017-12-13 MED ORDER — ATORVASTATIN CALCIUM 10 MG PO TABS
10.0000 mg | ORAL_TABLET | Freq: Every day | ORAL | Status: DC
  Administered 2017-12-14 – 2017-12-15 (×2): 10 mg via ORAL
  Filled 2017-12-13 (×2): qty 1

## 2017-12-13 NOTE — Telephone Encounter (Signed)
New message   Per Pecolia Ades [facility] Appointment on 12/22/17 at 11:00 am with Almyra Deforest.

## 2017-12-13 NOTE — Consult Note (Signed)
Cardiology Consultation:   Patient ID: Sharon Knight; [id number]; [birth year]   Admit date: 12/10/2017 Date of Consult: 12/13/2017  Primary Care Provider: Binnie Rail, MD Primary Cardiologist: Skeet Latch, MD   Patient Profile:   Sharon Knight is a 72 y.o. female with a hx of hypertension, hyperlipidemia and right nephrectomy who is being seen today for the evaluation of atrial fibrillation at the request of Dr. Ree Kida.  History of Present Illness:   Sharon Knight presented to [facility] on 12/10/2017 at the direction of her primary care physician for elevated white blood cell count.  She was found to have fever, lobar pneumonia, acute kidney injury and atrial fibrillation with rapid ventricular response.   Sharon Knight states that last week she began feeling poorly.  She cannot identify any specific pain, shortness of breath or palpitations.  She says she felt weak and groggy.  She is an ex-smoker having quit at least 35 years ago.  She denies any prior cardiac history of MI, procedures or arrhythmias.  She occasionally gets a heart flutter when she is outside working in the heat but this is brief and has not bothered her enough to be evaluated for it.  Past Medical History:  Diagnosis Date  . Arthritis   . Bilateral cataracts    Dr Kathrin Penner  . Hyperlipidemia   . Hypertension     Past Surgical History:  Procedure Laterality Date  . COLONOSCOPY     Dr Fuller Plan  . 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 OVARIAN CYST REMOVAL       Home Medications:  Prior to Admission medications   Medication Sig Start Date End Date Taking? Authorizing Provider  acetaminophen (TYLENOL) 325 MG tablet Take 2 tablets (650 mg total) by mouth every 6 (six) hours as needed. Patient taking differently: Take 325 mg by mouth 2 (two) times daily as needed (pain.).  09/09/17  Yes Burns, Claudina Lick, MD  Docusate Calcium (STOOL SOFTENER PO) Take 2-3 capsules by mouth See admin instructions. Every  night at bedtime.   Yes [provider]  hydrochlorothiazide (HYDRODIURIL) 25 MG tablet TAKE (1/2) TABLET DAILY. Patient taking differently: Take 12.5 mg by mouth daily.  09/03/17  Yes Burns, Claudina Lick, MD  metoprolol tartrate (LOPRESSOR) 100 MG tablet TAKE (1/2) TABLET TWICE DAILY. Patient taking differently: Take 50 mg by mouth 2 (two) times daily.  04/20/17  Yes Burns, Claudina Lick, MD  Multiple Vitamin (MULTIVITAMIN) tablet Take 1 tablet by mouth daily.   Yes [provider]  simvastatin (ZOCOR) 40 MG tablet TAKE 1 TABLET EACH DAY. Patient taking differently: Take 40 mg by mouth every evening.  04/20/17  Yes Binnie Rail, MD    Inpatient Medications: Scheduled Meds: . atorvastatin  20 mg Oral q1800  . azithromycin  500 mg Oral q1800  . enoxaparin (LOVENOX) injection  1 mg/kg Subcutaneous Q12H  . metoprolol tartrate  50 mg Oral BID  . sodium chloride flush  3 mL Intravenous Q12H   Continuous Infusions: . cefTRIAXone (ROCEPHIN)  IV Stopped (12/12/17 1759)  . diltiazem (CARDIZEM) infusion 10 mg/hr (12/13/17 0600)   PRN Meds: acetaminophen **OR** acetaminophen, ondansetron **OR** ondansetron (ZOFRAN) IV  Allergies:    Allergies  Allergen Reactions  . Ibuprofen Other (See Comments)    Dizziness "made me woozy"    Social History:   Social History   Socioeconomic History  . Marital status: Married    Spouse name: Not on file  .  Number of children: Not on file  . Years of education: Not on file  . Highest education level: Not on file  Occupational History  . Not on file  Social Needs  . Financial resource strain: Not on file  . Food insecurity:    Worry: Not on file    Inability: Not on file  . Transportation needs:    Medical: Not on file    Non-medical: Not on file  Tobacco Use  . Smoking status: Former Smoker    Last attempt to quit: 08/27/1984    Years since quitting: 33.3  . Smokeless tobacco: Never Used  . Tobacco comment: smoked age [age], up to  1.5-2 ppd  Substance and Sexual Activity  . Alcohol use: Yes    Alcohol/week: 3.0 standard drinks    Types: 3 Cans of beer per week    Comment: 3 x weekly  . Drug use: No  . Sexual activity: Not on file  Lifestyle  . Physical activity:    Days per week: Not on file    Minutes per session: Not on file  . Stress: Not on file  Relationships  . Social connections:    Talks on phone: Not on file    Gets together: Not on file    Attends religious service: Not on file    Active member of club or organization: Not on file    Attends meetings of clubs or organizations: Not on file    Relationship status: Not on file  . Intimate partner violence:    Fear of current or ex partner: Not on file    Emotionally abused: Not on file    Physically abused: Not on file    Forced sexual activity: Not on file  Other Topics Concern  . Not on file  Social History Narrative   Exercise: none    Family History:    Family History  Adopted: Yes  Problem Relation Age of Onset  . Colon cancer Neg Hx   . Stomach cancer Neg Hx      ROS:  Please see the history of present illness.   All other ROS reviewed and negative.     Physical Exam/Data:   Vitals:   12/13/17 0359 12/13/17 0400 12/13/17 0500 12/13/17 0750  BP:  (!) 146/84 (!) 146/84   Pulse:  85 82   Resp:  20 (!) 22   Temp: 98.5 F (36.9 C)   98.1 F (36.7 C)  TempSrc: Oral   Oral  SpO2:  95% 96%   Weight:      Height:        Intake/Output Summary (Last 24 hours) at 12/13/2017 0905 Last data filed at 12/13/2017 0600 Gross per 24 hour  Intake 1859.68 ml  Output 1600 ml  Net 259.68 ml   Filed Weights   12/10/17 2100  Weight: 75.4 kg   Body mass index is 26.83 kg/m.  General:  Well nourished, well developed, in no acute distress HEENT: normal Lymph: no adenopathy Neck: no JVD Endocrine:  No thryomegaly Vascular: No carotid bruits; FA pulses 2+ bilaterally without bruits  Cardiac:  normal S1, S2; irregularly irregular  rhythm; no murmur  Lungs:  clear to auscultation bilaterally, no wheezing, rhonchi or rales  Abd: soft, nontender, no hepatomegaly  Ext: no edema.   Musculoskeletal:  No deformities, BUE and BLE strength normal and equal. Redness noted at the base of her right great toe Skin: warm and dry  Neuro:  CNs 2-12  intact, no focal abnormalities noted Psych:  Normal affect   EKG:  The EKG was personally reviewed and demonstrates: Real fibrillation with RVR Telemetry:  Telemetry was personally reviewed and demonstrates: Atrial fibrillation with rates 80s- low 100s  Relevant CV Studies:  Echocardiogram 12/11/2017 Study Conclusions - Left ventricle: The cavity size was normal. Wall thickness was   increased in a pattern of moderate LVH. Systolic function was   vigorous. The estimated ejection fraction was in the range of 65%   to 70%. Wall motion was normal; there were no regional wall   motion abnormalities. The study was not technically sufficient to   allow evaluation of LV diastolic dysfunction due to atrial   fibrillation. - Aortic valve: Valve area (VTI): 2.14 cm^2. Valve area (Vmax):   2.43 cm^2. Valve area (Vmean): 2.33 cm^2. - Mitral valve: Valve area by continuity equation (using LVOT   flow): 1.61 cm^2. - Atrial septum: No defect or patent foramen ovale was identified.  Laboratory Data:  Chemistry Recent Labs  Lab 12/11/17 0354 12/11/17 1138 12/12/17 0350 12/13/17 0401  NA 141  --  143 142  K 2.7* 3.2* 3.6 3.8  CL 106  --  116* 112*  CO2 23  --  21* 22  GLUCOSE 117*  --  137* 130*  BUN 19  --  12 10  CREATININE 1.06*  --  0.75 0.72  CALCIUM 8.4*  --  8.1* 8.8*  GFRNONAA 51*  --  >60 >60  GFRAA 59*  --  >60 >60  ANIONGAP 12  --  6 8    Recent Labs  Lab 12/10/17 1135  PROT 8.3  ALBUMIN 4.1  AST 19  ALT 10  ALKPHOS 60  BILITOT 1.6*   Hematology Recent Labs  Lab 12/11/17 0354 12/12/17 0350 12/13/17 0401  WBC 13.6* 10.0 10.5  RBC 4.41 3.83* 4.07  HGB 13.5  11.7* 12.4  HCT 40.7 35.8* 37.6  MCV 92.3 93.5 92.4  MCH 30.6 30.5 30.5  MCHC 33.2 32.7 33.0  RDW 13.0 13.1 13.1  PLT 275 260 297   Cardiac Enzymes Recent Labs  Lab 12/10/17 1539  TROPONINI <0.03   No results for input(s): TROPIPOC in the last 168 hours.  BNPNo results for input(s): BNP, PROBNP in the last 168 hours.  DDimer No results for input(s): DDIMER in the last 168 hours.  Radiology/Studies:  Dg Chest 2 View  Result Date: 12/10/2017 CLINICAL DATA:  Vomiting EXAM: CHEST - 2 VIEW COMPARISON:  None. FINDINGS: There are airspace opacities towards the base of the left lower lobe. Lungs otherwise clear. Heart is normal in size. No pneumothorax or pleural effusion. IMPRESSION: Left lower lobe pneumonia. Followup PA and lateral chest X-ray is recommended in 3-4 weeks following trial of antibiotic therapy to ensure resolution and exclude underlying malignancy. Electronically Signed   By: Marybelle Killings M.D.   On: 12/10/2017 14:45   Dg Chest Port 1 View  Result Date: 12/12/2017 CLINICAL DATA:  Shortness of breath EXAM: PORTABLE CHEST 1 VIEW COMPARISON:  December 10, 2017 FINDINGS: Continued infiltrate in left base consistent with no pneumonia. No pneumothorax. The cardiomediastinal silhouette is stable. No other changes. IMPRESSION: Continued left basilar infiltrate. Recommend follow-up to resolution. Electronically Signed   By: Dorise Bullion III M.D   On: 12/12/2017 16:59    Assessment and Plan:   New onset atrial fibrillation with RVR -In setting of sepsis secondary to pneumonia.  No chest pain/tightness, shortness of breath or palpitations noted by  the patient.  Unable to identify when the A. fib started.  Apparently it was not noted in the doctor's office on 8/8. -No prior cardiac history.  Troponin was <0.03 on presentation -TSH was within normal limits, 3.1 -He has had some hypokalemia and potassium is being replaced. -Initially atrial fibrillation in the 130s.  He was given several  doses of IV beta-blocker but continue to have elevated heart rate.  She is now on a Cardizem drip at 10 mg/h as well as her home metoprolol 50 mg twice daily.  She is still in atrial fibrillation with rates in the 80s- low 100s. -CHA2DS2/VAS Stroke Risk Score is 3 (HTN, Age, female). She is currently on full dose Lovenox for stroke risk reduction.  Would transition to [facility]. She had acute kidney injury on admission but renal function is now normal. Consider  Eliquis 5 mg BID.  -Increase diltiazem to 50 mg/h.  We will discuss increasing beta-blocker with Dr. Oval Linsey. -A. fib may be situational with her pneumonia.  Would work on rate control and initiate anticoagulation.  She may convert to sinus rhythm once she is over the pneumonia.  Follow-up in the office and if still in A. fib after 3 weeks of anticoagulation would consider electrical cardioversion.    Essential hypertension -Home medications include hydrochlorothiazide 12.5 mg daily, metoprolol tartrate 50 mg twice daily -Chlorothiazide is on hold, likely due to her initial acute renal injury.  She has been restarted on her metoprolol tartrate 50 mg twice daily -Blood pressure is mildly elevated 146/84  Sepsis secondary to pneumonia -Management per primary team with IV antibiotics.  Leukocytosis has improved and she is afebrile.  Blood cultures with no growth.  Hyperlipidemia -Simvastatin 40 mg daily -Last lipid panel in 09/20/2017 showed LDL of 91 -Continue current therapy   For questions or updates, please contact [place] Please consult [email or web address] for contact info under Cardiology/STEMI.   Signed, Daune Perch, NP  12/13/2017 9:05 AM

## 2017-12-13 NOTE — Progress Notes (Signed)
PROGRESS NOTE    Sharon Knight  OIZ:[id number] DOB: [birth year] DOA: 12/10/2017 PCP: Binnie Rail, MD   Brief Narrative:  HPI on 12/10/2017 by Dr. Murray Hodgkins 72 year old woman PMH hypertension, hyperlipidemia, presented to the emergency department at the direction of her primary care physician for elevated WBC.  Was found to have lobar pneumonia, acute kidney injury, atrial fibrillation with rapid ventricular response but did not meet sepsis criteria.  She reports she is felt poorly for approximately a week, extreme fatigue, hardly walking, very poor appetite, did not eat for the last 3 days other than one meal.  She said no cough no fever, no systemic symptoms or shortness of breath.  No other associated symptoms.  No palpitations or chest pain.  Interim history Found to have sepsis secondary to pneumonia along with new onset AF RVR, started on cardizem. Despite being on cardizem drip and metoprolol, HR continues to be elevated. Cardiology consulted.  Assessment & Plan   Severe sepsis secondary to pneumonia -Upon admission patient was noted to have atrial fibrillation with RVR (tachycardia), leukocytosis of 21.8, setting of acute kidney injury and pneumonia.  Overnight she developed fever of 100.4 F along with tachypnea.  -Leukocytosis has improved to 10.5 -Chest x-ray reviewed, showed left lower lobe pneumonia -Continue azithromycin and ceftriaxone -Blood cultures show no growth to date -Sputum culture rare GNR/GPR, few GPC  Atrial fibrillation with RVR -Possibly secondary to pneumonia -New onset -Potassium 3.6 (will continue to replace) -TSH 3.1 (WNL) -Echocardiogram showed EF of 65 to 70%, no regional wall motion abnormalities no PFO or atrial septal defect identified -Given several doses of metoprolol on admission however continues to have heart rate in the 150s -Have placed patient on Cardizem drip, rate has mildly improved, now in the low 100s (suspect anxiety may also be  playing a role) -Continue metoprolol -Continue full dose Lovenox -CHADSVASC 3 given age, gender, hypertension -Will consult case management regarding NOAC -cardiology consulted and appreciated for aid with rate control  Dyspnea -patient became SOB yesterday evening -discontinued IVF, given 1 dose of IV lasix 20mg   -CXR reviewed showing pneumonia  -continue supplemental O2 to maintain O2 sats >92%  Acute kidney injury -Creatinine upon admission 1.56 -Baseline creatinine approximately 1.06-1.08 -Placed on IV fluids, and discontinued  -Creatinine currently 0.72 -Continue to monitor BMP  Hypokalemia -Potassium 3.8, will continue to replace, goal of 4 -Continue to monitor BMP -Magnesium 1.9  Essential hypertension -placed on cardizem drip and oral metoprolol  Hyperlipidemia -Patient normally on simvastatin at home however given the patient we will on Cardizem will transitioned to atorvastatin 20 mg to avoid drug interactions.  Anxiety -will place on low dose xanax and monitor   DVT Prophylaxis  Lovenox   Code Status: Full  Family Communication: None at bedside  Disposition Plan: Admitted. Suspect discharge to home in 24-48 hours pending improvement in HR.  Consultants Cardiology  Procedures  Echocardiogram  Antibiotics   Anti-infectives (From admission, onward)   Start     Dose/Rate Route Frequency Ordered Stop   12/11/17 1800  azithromycin (ZITHROMAX) tablet 500 mg     500 mg Oral Daily-1800 12/11/17 1106 12/17/17 1759   12/11/17 1700  azithromycin (ZITHROMAX) 500 mg in sodium chloride 0.9 % 250 mL IVPB  Status:  Discontinued     500 mg 250 mL/hr over 60 Minutes Intravenous Every 24 hours 12/10/17 1939 12/11/17 1106   12/11/17 1600  cefTRIAXone (ROCEPHIN) 1 g in sodium chloride 0.9 % 100 mL IVPB  1 g 200 mL/hr over 30 Minutes Intravenous Every 24 hours 12/10/17 1939 12/18/17 1559   12/10/17 1530  cefTRIAXone (ROCEPHIN) 2 g in sodium chloride 0.9 % 100 mL IVPB   Status:  Discontinued     2 g 200 mL/hr over 30 Minutes Intravenous Every 24 hours 12/10/17 1518 12/10/17 1939   12/10/17 1530  azithromycin (ZITHROMAX) 500 mg in sodium chloride 0.9 % 250 mL IVPB  Status:  Discontinued     500 mg 250 mL/hr over 60 Minutes Intravenous Every 24 hours 12/10/17 1518 12/10/17 1939      Subjective:   Hermine Messick seen and examined today.  Concerned about her blood pressure. Denies chest pain, current shortness of breath, abdominal pain, N/V/D/C. Wonders when she would be able to go home.  Objective:   Vitals:   12/13/17 0359 12/13/17 0400 12/13/17 0500 12/13/17 0750  BP:  (!) 146/84 (!) 146/84   Pulse:  85 82   Resp:  20 (!) 22   Temp: 98.5 F (36.9 C)   98.1 F (36.7 C)  TempSrc: Oral   Oral  SpO2:  95% 96%   Weight:      Height:        Intake/Output Summary (Last 24 hours) at 12/13/2017 0906 Last data filed at 12/13/2017 0600 Gross per 24 hour  Intake 1859.68 ml  Output 1600 ml  Net 259.68 ml   Filed Weights   12/10/17 2100  Weight: 75.4 kg   Exam  General: Well developed, well nourished, NAD, appears stated age  37: NCAT, mucous membranes moist.   Neck: Supple  Cardiovascular: S1 S2 auscultated, irregularly irregular  Respiratory: Clear to auscultation bilaterally  Abdomen: Soft, nontender, nondistended, + bowel sounds  Extremities: warm dry without cyanosis clubbing or edema  Neuro: AAOx3, nonfocal  Psych: Anxious affect, however pleasant  Data Reviewed: I have personally reviewed following labs and imaging studies  CBC: Recent Labs  Lab 12/10/17 1135 12/11/17 0354 12/12/17 0350 12/13/17 0401  WBC 21.8 Repeated and verified X2.* 13.6* 10.0 10.5  NEUTROABS 19.0*  --   --   --   HGB 15.1* 13.5 11.7* 12.4  HCT 43.9 40.7 35.8* 37.6  MCV 90.0 92.3 93.5 92.4  PLT 284.0 275 260 425   Basic Metabolic Panel: Recent Labs  Lab 12/10/17 1135 12/11/17 0354 12/11/17 0730 12/11/17 1138 12/12/17 0350 12/12/17 0940  12/13/17 0401  NA 136 141  --   --  143  --  142  K 4.2 2.7*  --  3.2* 3.6  --  3.8  CL 97 106  --   --  116*  --  112*  CO2 30 23  --   --  21*  --  22  GLUCOSE 152* 117*  --   --  137*  --  130*  BUN 25* 19  --   --  12  --  10  CREATININE 1.56* 1.06*  --   --  0.75  --  0.72  CALCIUM 10.2 8.4*  --   --  8.1*  --  8.8*  MG  --   --  1.9  --   --  1.9  --    GFR: Estimated Creatinine Clearance: 65 mL/min (by C-G formula based on SCr of 0.72 mg/dL). Liver Function Tests: Recent Labs  Lab 12/10/17 1135  AST 19  ALT 10  ALKPHOS 60  BILITOT 1.6*  PROT 8.3  ALBUMIN 4.1   No results for input(s): LIPASE, AMYLASE  in the last 168 hours. No results for input(s): AMMONIA in the last 168 hours. Coagulation Profile: No results for input(s): INR, PROTIME in the last 168 hours. Cardiac Enzymes: Recent Labs  Lab 12/10/17 1539  TROPONINI <0.03   BNP (last 3 results) No results for input(s): PROBNP in the last 8760 hours. HbA1C: No results for input(s): HGBA1C in the last 72 hours. CBG: No results for input(s): GLUCAP in the last 168 hours. Lipid Profile: No results for input(s): CHOL, HDL, LDLCALC, TRIG, CHOLHDL, LDLDIRECT in the last 72 hours. Thyroid Function Tests: Recent Labs    12/11/17 0354  TSH 3.100   Anemia Panel: No results for input(s): VITAMINB12, FOLATE, FERRITIN, TIBC, IRON, RETICCTPCT in the last 72 hours. Urine analysis:    Component Value Date/Time   COLORURINE YELLOW 12/10/2017 1730   APPEARANCEUR CLEAR 12/10/2017 1730   LABSPEC 1.005 12/10/2017 1730   PHURINE 6.0 12/10/2017 1730   GLUCOSEU NEGATIVE 12/10/2017 1730   GLUCOSEU NEGATIVE 12/10/2017 1146   HGBUR SMALL (A) 12/10/2017 1730   BILIRUBINUR NEGATIVE 12/10/2017 1730   KETONESUR NEGATIVE 12/10/2017 1730   PROTEINUR NEGATIVE 12/10/2017 1730   UROBILINOGEN 1.0 12/10/2017 1146   NITRITE NEGATIVE 12/10/2017 1730   LEUKOCYTESUR SMALL (A) 12/10/2017 1730   Sepsis  Labs: @LABRCNTIP (procalcitonin:4,lacticidven:4)  ) Recent Results (from the past 240 hour(s))  Blood Culture (routine x 2)     Status: None (Preliminary result)   Collection Time: 12/10/17  3:39 PM  Result Value Ref Range Status   Specimen Description   Final    BLOOD RIGHT ANTECUBITAL Performed at [facility], [place] [address], [place], [place] [address]    Special Requests   Final    BOTTLES DRAWN AEROBIC AND ANAEROBIC Blood Culture adequate volume Performed at [facility] [address], [place], [place] [address]    Culture   Final    NO GROWTH 2 DAYS Performed at [facility] [address] [place], [place] [address]    Report Status PENDING  Incomplete  Blood Culture (routine x 2)     Status: None (Preliminary result)   Collection Time: 12/10/17  3:39 PM  Result Value Ref Range Status   Specimen Description   Final    BLOOD RIGHT ANTECUBITAL Performed at [facility] [address] [place], [place] [address]    Special Requests   Final    BOTTLES DRAWN AEROBIC AND ANAEROBIC Blood Culture adequate volume Performed at [facility] [address], [place], [place] [address]    Culture   Final    NO GROWTH 2 DAYS Performed at [facility] [address] [place], [place] [address]    Report Status PENDING  Incomplete  MRSA PCR Screening     Status: None   Collection Time: 12/11/17  2:47 AM  Result Value Ref Range Status   MRSA by PCR NEGATIVE NEGATIVE Final    Comment:        The GeneXpert MRSA Assay (FDA approved for NASAL specimens only), is one component of a comprehensive MRSA colonization surveillance program. It is not intended to diagnose MRSA infection nor to guide or monitor treatment for MRSA infections. Performed at [facility], [place] [address], [place], [place] [address]   Culture, sputum-assessment     Status: None   Collection Time:  12/11/17  6:26 AM  Result Value Ref Range Status   Specimen Description SPUTUM  Final  Special Requests NONE  Final   Sputum evaluation   Final    THIS SPECIMEN IS ACCEPTABLE FOR SPUTUM CULTURE Performed at [employer] [facility] [address] [place], [place] [address]    Report Status 12/11/2017 FINAL  Final  Culture, respiratory     Status: None (Preliminary result)   Collection Time: 12/11/17  6:26 AM  Result Value Ref Range Status   Specimen Description   Final    SPUTUM Performed at [facility] [address] [place], [place] [address]    Special Requests   Final    NONE Reflexed from [id number] Performed at [facility], [place] [address] [place], [place] [address]    Gram Stain   Final    FEW WBC PRESENT, PREDOMINANTLY PMN FEW GRAM POSITIVE COCCI IN CLUSTERS RARE GRAM NEGATIVE RODS RARE GRAM POSITIVE RODS    Culture   Final    CULTURE REINCUBATED FOR BETTER GROWTH Performed at [facility], [place] [address] [place], [place] [address]    Report Status PENDING  Incomplete      Radiology Studies: Dg Chest Port 1 View  Result Date: 12/12/2017 CLINICAL DATA:  Shortness of breath EXAM: PORTABLE CHEST 1 VIEW COMPARISON:  December 10, 2017 FINDINGS: Continued infiltrate in left base consistent with no pneumonia. No pneumothorax. The cardiomediastinal silhouette is stable. No other changes. IMPRESSION: Continued left basilar infiltrate. Recommend follow-up to resolution. Electronically Signed   By: Dorise Bullion III M.D   On: 12/12/2017 16:59     Scheduled Meds: . atorvastatin  20 mg Oral q1800  . azithromycin  500 mg Oral q1800  . enoxaparin (LOVENOX) injection  1 mg/kg Subcutaneous Q12H  . metoprolol tartrate  50 mg Oral BID  . sodium chloride flush  3 mL Intravenous Q12H   Continuous Infusions: . cefTRIAXone (ROCEPHIN)  IV Stopped (12/12/17 1759)  . diltiazem (CARDIZEM) infusion 10 mg/hr (12/13/17 0600)      LOS: 3 days   Time Spent in minutes   45 minutes  Kaspar Albornoz D.O. on 12/13/2017 at 9:06 AM  Between 7am to 7pm - Pager - [phone]  After 7pm go to [email or web address] - password [email or web address]  And look for the night coverage person covering for me after hours  [facility] Office  [phone]

## 2017-12-13 NOTE — Progress Notes (Signed)
PO Cardizem given approx 1600 ,with orders to discontinue Cardizem drip 2 hrs later. Pt. HR in the 140-160's at rest .Cardiology notified. Orders to keep pt. on drip and titrate as necessary.

## 2017-12-13 NOTE — Telephone Encounter (Signed)
Patient currently in hospital.

## 2017-12-13 NOTE — Care Management Note (Signed)
Case Management Note  Patient Details  Name: Sharon Knight MRN: [id number] Date of Birth: 1945-06-16  Subjective/Objective:         Lobar pna, a.fib with rvr/bulture cultres x2 pending/ h.r.-153/           Action/Plan:  Has legal guardian/iv cardizem for now onset a.fib/will follow for cm needs. Patient is from home with spouse.   Expected Discharge Date:  (unknown)               Expected Discharge Plan:     In-House Referral:     Discharge planning Services     Post Acute Care Choice:    Choice offered to:     DME Arranged:    DME Agency:     HH Arranged:    HH Agency:     Status of Service:     If discussed at [employer], dates discussed:    Additional Comments:  Leeroy Cha, RN 12/13/2017, 9:33 AM

## 2017-12-13 NOTE — Progress Notes (Signed)
ANTICOAGULATION CONSULT NOTE - Follow Up Consult  Pharmacy Consult for enoxaparin Indication: atrial fibrillation  Allergies  Allergen Reactions  . Ibuprofen Other (See Comments)    Dizziness "made me woozy"    Patient Measurements: Height: 5\' 6"  (167.6 cm) Weight: 166 lb 3.6 oz (75.4 kg) IBW/kg (Calculated) : 59.3 Pt weight: 74kg  Vital Signs: Temp: 98.1 F (36.7 C) (08/12 0750) Temp Source: Oral (08/12 0750) BP: 146/84 (08/12 0500) Pulse Rate: 82 (08/12 0500)  Labs: Recent Labs    12/10/17 1539 12/11/17 0354 12/12/17 0350 12/13/17 0401  HGB  --  13.5 11.7* 12.4  HCT  --  40.7 35.8* 37.6  PLT  --  275 260 297  CREATININE  --  1.06* 0.75 0.72  TROPONINI <0.03  --   --   --     Estimated Creatinine Clearance: 65 mL/min (by C-G formula based on SCr of 0.72 mg/dL).   Medical History: Past Medical History:  Diagnosis Date  . Arthritis   . Bilateral cataracts    Dr Kathrin Penner  . Hyperlipidemia   . Hypertension      Assessment: 72 year old woman PMH hypertension, hyperlipidemia, presented to the emergency department at the direction of her PCP for elevated WBC.  Was found to have lobar pneumonia, acute kidney injury, atrial fibrillation with rapid ventricular.  Pharmacy consulted to dose enoxaparin for AFib.  No prior anticoagulation noted.  12/13/2017 Scr improved 0.72, CrCl ~ 65 mls/min CBC WNL No bleeding reported  Goal of Therapy:  Anti-Xa level 0.6-1 units/ml 4hrs after LMWH dose given Monitor platelets by anticoagulation protocol   Plan:  Continue Enoxaparin 1mg /kg (75mg ) SQ q12h Monitor renal function Check CBC Q72h Follow up plans for long-term anticoagulation  Peggyann Juba, PharmD, BCPS Pager: [phone] 12/13/2017, 8:31 AM

## 2017-12-14 ENCOUNTER — Inpatient Hospital Stay (HOSPITAL_COMMUNITY): Payer: Medicare Other

## 2017-12-14 DIAGNOSIS — A419 Sepsis, unspecified organism: Principal | ICD-10-CM

## 2017-12-14 LAB — CBC
HEMATOCRIT: 37 % (ref 36.0–46.0)
HEMOGLOBIN: 12 g/dL (ref 12.0–15.0)
MCH: 30.3 pg (ref 26.0–34.0)
MCHC: 32.4 g/dL (ref 30.0–36.0)
MCV: 93.4 fL (ref 78.0–100.0)
PLATELETS: 336 10*3/uL (ref 150–400)
RBC: 3.96 MIL/uL (ref 3.87–5.11)
RDW: 13.2 % (ref 11.5–15.5)
WBC: 12.7 10*3/uL — AB (ref 4.0–10.5)

## 2017-12-14 LAB — CULTURE, RESPIRATORY W GRAM STAIN

## 2017-12-14 LAB — BASIC METABOLIC PANEL
ANION GAP: 10 (ref 5–15)
BUN: 10 mg/dL (ref 8–23)
CHLORIDE: 108 mmol/L (ref 98–111)
CO2: 22 mmol/L (ref 22–32)
Calcium: 8.8 mg/dL — ABNORMAL LOW (ref 8.9–10.3)
Creatinine, Ser: 0.74 mg/dL (ref 0.44–1.00)
GFR calc Af Amer: >60 mL/min (ref >60)
GLUCOSE: 140 mg/dL — AB (ref 70–99)
Potassium: 3.8 mmol/L (ref 3.5–5.1)
Sodium: 140 mmol/L (ref 135–145)

## 2017-12-14 LAB — CULTURE, RESPIRATORY

## 2017-12-14 MED ORDER — INDOMETHACIN 25 MG PO CAPS
25.0000 mg | ORAL_CAPSULE | Freq: Three times a day (TID) | ORAL | Status: DC | PRN
  Filled 2017-12-14: qty 1

## 2017-12-14 MED ORDER — CALCIUM CARBONATE ANTACID 500 MG PO CHEW
400.0000 mg | CHEWABLE_TABLET | Freq: Three times a day (TID) | ORAL | Status: DC | PRN
  Administered 2017-12-14: 400 mg via ORAL
  Filled 2017-12-14: qty 2

## 2017-12-14 MED ORDER — PREDNISONE 20 MG PO TABS
60.0000 mg | ORAL_TABLET | Freq: Every day | ORAL | Status: DC
  Administered 2017-12-14 – 2017-12-16 (×3): 60 mg via ORAL
  Filled 2017-12-14 (×3): qty 3

## 2017-12-14 MED ORDER — APIXABAN 5 MG PO TABS
5.0000 mg | ORAL_TABLET | Freq: Two times a day (BID) | ORAL | Status: DC
  Administered 2017-12-14 – 2017-12-16 (×4): 5 mg via ORAL
  Filled 2017-12-14 (×4): qty 1

## 2017-12-14 NOTE — Discharge Instructions (Signed)
Information on my medicine - ELIQUIS (apixaban)  This medication education was reviewed with me or my healthcare representative as part of my discharge preparation.  The pharmacist that spoke with me during my hospital stay was:  Simonne Come, Student-PharmD  Why was Eliquis prescribed for you? Eliquis was prescribed for you to reduce the risk of a blood clot forming that can cause a stroke if you have a medical condition called atrial fibrillation (a type of irregular heartbeat).  What do You need to know about Eliquis ? Take your Eliquis TWICE DAILY - one tablet in the morning and one tablet in the evening with or without food. If you have difficulty swallowing the tablet whole please discuss with your pharmacist how to take the medication safely.  Take Eliquis exactly as prescribed by your doctor and DO NOT stop taking Eliquis without talking to the doctor who prescribed the medication.  Stopping may increase your risk of developing a stroke.  Refill your prescription before you run out.  After discharge, you should have regular check-up appointments with your healthcare provider that is prescribing your Eliquis.  In the future your dose may need to be changed if your kidney function or weight changes by a significant amount or as you get older.  What do you do if you miss a dose? If you miss a dose, take it as soon as you remember on the same day and resume taking twice daily.  Do not take more than one dose of ELIQUIS at the same time to make up a missed dose.  Important Safety Information A possible side effect of Eliquis is bleeding. You should call your healthcare provider right away if you experience any of the following: ? Bleeding from an injury or your nose that does not stop. ? Unusual colored urine (red or dark [place]) or unusual colored stools (red or black). ? Unusual bruising for unknown reasons. ? A serious fall or if you hit your head (even if there is no  bleeding).  Some medicines may interact with Eliquis and might increase your risk of bleeding or clotting while on Eliquis. To help avoid this, consult your healthcare provider or pharmacist prior to using any new prescription or non-prescription medications, including herbals, vitamins, non-steroidal anti-inflammatory drugs (NSAIDs) and supplements.  This website has more information on Eliquis (apixaban): http://www.eliquis.com/eliquis/home

## 2017-12-14 NOTE — Telephone Encounter (Signed)
Patient still currently admitted to hospital.

## 2017-12-14 NOTE — Progress Notes (Signed)
Patient ID: Sharon Knight, female   DOB: 11/24/1945, 72 y.o.   MRN: [id number]  PROGRESS NOTE    Sharon Knight  DZH:[id number] DOB: 1945/10/05 DOA: 12/10/2017 PCP: Binnie Rail, MD   Brief Narrative:  72 year old female with hypertension, hyperlipidemia presented to the emergency department at the direction of her primary care physician for elevated WBC.  She was found to have lobar pneumonia, acute kidney injury, atrial fibrillation with rapid ventricular rate.  She was started on antibiotics.  She had to be started on Cardizem drip.  Cardiology was consulted.   Assessment & Plan:   Principal Problem:   Lobar pneumonia ([place]) Active Problems:   New onset a-fib ([place])   AKI (acute kidney injury) ([place])   Sepsis secondary to pneumonia -Currently hemodynamically improved.  Blood cultures negative so far.  Antibiotic plan as below  Left lobar bacterial pneumonia -Continue Rocephin and Zithromax.  Blood cultures negative so far.  Respiratory status stable currently  New onset atrial fibrillation with rapid ventricular rate -Cardiology following.  Still on Cardizem drip.  Continue metoprolol.  Continue Eliquis as per cardiology -Echo showed EF of 65 to 70% with no regional wall motion abnormality.  Right foot pain -Question of acute gout.  X-ray of right foot.  Will use short course of oral prednisone and avoid nephrotoxic agents as much as possible.  Check uric acid level in a.m.  Acute kidney injury -Improved with IV fluids.  Creatinine normal currently.  Essential hypertension -Monitor blood pressure.  Continue Cardizem and metoprolol  Hyperlipidemia -Simvastatin has been switched to atorvastatin because of drug drug interaction of simvastatin with Cardizem    DVT prophylaxis: Eliquis Code Status: Full Family Communication: None at bedside Disposition Plan: Home in 1 to 2 days once cleared by cardiology  Consultants: Cardiology  Procedures: Echo showed EF of 65 to 70% with  no regional wall motion abnormality  Antimicrobials: Rocephin and Zithromax from 12/10/2017 onwards   Subjective: Patient seen and examined at bedside.  She feels weak but better.  No overnight fever, nausea or vomiting. Complains of right foot pain and redness Objective: Vitals:   12/14/17 0600 12/14/17 0700 12/14/17 0735 12/14/17 0800  BP: (!) 134/96 (!) 147/83  131/82  Pulse: (!) 57 78  74  Resp: (!) 32 (!) 21  (!) 24  Temp:   98.5 F (36.9 C)   TempSrc:   Oral   SpO2: 100% 96%  97%  Weight:      Height:        Intake/Output Summary (Last 24 hours) at 12/14/2017 1141 Last data filed at 12/14/2017 0800 Gross per 24 hour  Intake 500.62 ml  Output 1725 ml  Net -1224.38 ml   Filed Weights   12/10/17 2100  Weight: 75.4 kg    Examination:  General exam: Appears calm and comfortable  Respiratory system: Bilateral decreased breath sounds at bases with some scattered crackles Cardiovascular system: S1 & S2 heard, Rate controlled Gastrointestinal system: Abdomen is nondistended, soft and nontender. Normal bowel sounds heard. Extremities: No cyanosis, clubbing, edema.  Right foot erythema and mild tenderness at the base of first toe, no open wounds     Data Reviewed: I have personally reviewed following labs and imaging studies  CBC: Recent Labs  Lab 12/10/17 1135 12/11/17 0354 12/12/17 0350 12/13/17 0401 12/14/17 0318  WBC 21.8 Repeated and verified X2.* 13.6* 10.0 10.5 12.7*  NEUTROABS 19.0*  --   --   --   --   HGB 15.1*  13.5 11.7* 12.4 12.0  HCT 43.9 40.7 35.8* 37.6 37.0  MCV 90.0 92.3 93.5 92.4 93.4  PLT 284.0 275 260 297 191   Basic Metabolic Panel: Recent Labs  Lab 12/10/17 1135 12/11/17 0354 12/11/17 0730 12/11/17 1138 12/12/17 0350 12/12/17 0940 12/13/17 0401 12/14/17 0318  NA 136 141  --   --  143  --  142 140  K 4.2 2.7*  --  3.2* 3.6  --  3.8 3.8  CL 97 106  --   --  116*  --  112* 108  CO2 30 23  --   --  21*  --  22 22  GLUCOSE 152* 117*   --   --  137*  --  130* 140*  BUN 25* 19  --   --  12  --  10 10  CREATININE 1.56* 1.06*  --   --  0.75  --  0.72 0.74  CALCIUM 10.2 8.4*  --   --  8.1*  --  8.8* 8.8*  MG  --   --  1.9  --   --  1.9  --   --    GFR: Estimated Creatinine Clearance: 65 mL/min (by C-G formula based on SCr of 0.74 mg/dL). Liver Function Tests: Recent Labs  Lab 12/10/17 1135  AST 19  ALT 10  ALKPHOS 60  BILITOT 1.6*  PROT 8.3  ALBUMIN 4.1   No results for input(s): LIPASE, AMYLASE in the last 168 hours. No results for input(s): AMMONIA in the last 168 hours. Coagulation Profile: No results for input(s): INR, PROTIME in the last 168 hours. Cardiac Enzymes: Recent Labs  Lab 12/10/17 1539  TROPONINI <0.03   BNP (last 3 results) No results for input(s): PROBNP in the last 8760 hours. HbA1C: No results for input(s): HGBA1C in the last 72 hours. CBG: No results for input(s): GLUCAP in the last 168 hours. Lipid Profile: No results for input(s): CHOL, HDL, LDLCALC, TRIG, CHOLHDL, LDLDIRECT in the last 72 hours. Thyroid Function Tests: No results for input(s): TSH, T4TOTAL, FREET4, T3FREE, THYROIDAB in the last 72 hours. Anemia Panel: No results for input(s): VITAMINB12, FOLATE, FERRITIN, TIBC, IRON, RETICCTPCT in the last 72 hours. Sepsis Labs: Recent Labs  Lab 12/10/17 1413 12/10/17 1548  LATICACIDVEN 1.58 1.88    Recent Results (from the past 240 hour(s))  Blood Culture (routine x 2)     Status: None (Preliminary result)   Collection Time: 12/10/17  3:39 PM  Result Value Ref Range Status   Specimen Description   Final    BLOOD RIGHT ANTECUBITAL Performed at [facility] [address], [place], [place] [address]    Special Requests   Final    BOTTLES DRAWN AEROBIC AND ANAEROBIC Blood Culture adequate volume Performed at [facility] [address] [place], [place] [address]    Culture   Final    NO GROWTH 4 DAYS Performed at [facility], [place] [address] [place], [place] [address]    Report Status PENDING  Incomplete  Blood Culture (routine x 2)     Status: None (Preliminary result)   Collection Time: 12/10/17  3:39 PM  Result Value Ref Range Status   Specimen Description   Final    BLOOD RIGHT ANTECUBITAL Performed at [facility] [address], [place], [place] [address]    Special Requests   Final    BOTTLES DRAWN AEROBIC AND ANAEROBIC Blood Culture adequate volume  Performed at [facility], [place] [address], [place], [place] [address]    Culture   Final    NO GROWTH 4 DAYS Performed at [facility], [place] [address] [place], [place] [address]    Report Status PENDING  Incomplete  MRSA PCR Screening     Status: None   Collection Time: 12/11/17  2:47 AM  Result Value Ref Range Status   MRSA by PCR NEGATIVE NEGATIVE Final    Comment:        The GeneXpert MRSA Assay (FDA approved for NASAL specimens only), is one component of a comprehensive MRSA colonization surveillance program. It is not intended to diagnose MRSA infection nor to guide or monitor treatment for MRSA infections. Performed at [facility], [place] [address] [place], [place] [address]   Culture, sputum-assessment     Status: None   Collection Time: 12/11/17  6:26 AM  Result Value Ref Range Status   Specimen Description SPUTUM  Final   Special Requests NONE  Final   Sputum evaluation   Final    THIS SPECIMEN IS ACCEPTABLE FOR SPUTUM CULTURE Performed at [facility], [place] [address], [place], [place] [address]    Report Status 12/11/2017 FINAL  Final  Culture, respiratory     Status: None   Collection Time: 12/11/17  6:26 AM  Result Value Ref Range Status   Specimen Description   Final    SPUTUM Performed at [facility] [address], [place], [place] [address]    Special Requests   Final    NONE Reflexed from  [phone] Performed at [facility], [place] [address] [place], [place] [address]    Gram Stain   Final    FEW WBC PRESENT, PREDOMINANTLY PMN FEW GRAM POSITIVE COCCI IN CLUSTERS RARE GRAM NEGATIVE RODS RARE GRAM POSITIVE RODS Performed at [facility], [place] [address] [place], [place] [address]    Culture FEW CANDIDA ALBICANS  Final   Report Status 12/14/2017 FINAL  Final         Radiology Studies: Dg Chest Port 1 View  Result Date: 12/12/2017 CLINICAL DATA:  Shortness of breath EXAM: PORTABLE CHEST 1 VIEW COMPARISON:  December 10, 2017 FINDINGS: Continued infiltrate in left base consistent with no pneumonia. No pneumothorax. The cardiomediastinal silhouette is stable. No other changes. IMPRESSION: Continued left basilar infiltrate. Recommend follow-up to resolution. Electronically Signed   By: Dorise Bullion III M.D   On: 12/12/2017 16:59        Scheduled Meds: . apixaban  5 mg Oral BID  . atorvastatin  10 mg Oral q1800  . azithromycin  500 mg Oral q1800  . diltiazem  240 mg Oral Daily  . metoprolol tartrate  100 mg Oral BID  . sodium chloride flush  3 mL Intravenous Q12H   Continuous Infusions: . cefTRIAXone (ROCEPHIN)  IV Stopped (12/13/17 1624)  . diltiazem (CARDIZEM) infusion 7.5 mg/hr (12/14/17 0416)     LOS: 4 days        Aline August, MD [facility] Pager [phone]  If 7PM-7AM, please contact night-coverage [email or web address] Password [facility] 12/14/2017, 11:41 AM

## 2017-12-14 NOTE — Care Management Note (Signed)
Case Management Note  Patient Details  Name: Sharon Knight MRN: [id number] Date of Birth: Aug 19, 1945  Subjective/Objective:     A.fib with rvr continues/ on iv cardizem drip               Action/Plan: Home when stable following for cm needs  Expected Discharge Date:  (unknown)               Expected Discharge Plan:     In-House Referral:     Discharge planning Services     Post Acute Care Choice:    Choice offered to:     DME Arranged:    DME Agency:     HH Arranged:    [place] Agency:     Status of Service:     If discussed at [employer], dates discussed:    Additional Comments:  Leeroy Cha, RN 12/14/2017, 9:21 AM

## 2017-12-14 NOTE — Progress Notes (Signed)
Progress Note  Patient Name: Sharon Knight Date of Encounter: 12/14/2017  Primary Cardiologist: Skeet Latch, MD   Subjective   Patient states that she feels well without any chest discomfort, shortness of breath, palpitations or lightheadedness.  She has not been up out of the bed much.  Inpatient Medications    Scheduled Meds: . atorvastatin  10 mg Oral q1800  . azithromycin  500 mg Oral q1800  . diltiazem  240 mg Oral Daily  . enoxaparin (LOVENOX) injection  1 mg/kg Subcutaneous Q12H  . metoprolol tartrate  100 mg Oral BID  . sodium chloride flush  3 mL Intravenous Q12H   Continuous Infusions: . cefTRIAXone (ROCEPHIN)  IV Stopped (12/13/17 1624)  . diltiazem (CARDIZEM) infusion 7.5 mg/hr (12/14/17 0416)   PRN Meds: acetaminophen **OR** acetaminophen, ALPRAZolam, ondansetron **OR** ondansetron (ZOFRAN) IV   Vital Signs    Vitals:   12/14/17 0600 12/14/17 0700 12/14/17 0735 12/14/17 0800  BP: (!) 134/96 (!) 147/83  131/82  Pulse: (!) 57 78  74  Resp: (!) 32 (!) 21  (!) 24  Temp:   98.5 F (36.9 C)   TempSrc:   Oral   SpO2: 100% 96%  97%  Weight:      Height:        Intake/Output Summary (Last 24 hours) at 12/14/2017 0943 Last data filed at 12/14/2017 0800 Gross per 24 hour  Intake 517.32 ml  Output 1725 ml  Net -1207.68 ml   Filed Weights   12/10/17 2100  Weight: 75.4 kg    Telemetry    Atrial fibrillation with rates in the 80s-120s- Personally Reviewed  ECG    No new tracings- Personally Reviewed  Physical Exam   GEN: No acute distress.   Neck: No JVD Cardiac:  Irregularly irregular rhythm, no murmurs, rubs, or gallops.  Respiratory: Clear to auscultation bilaterally. GI: Soft, nontender, non-distended  MS: No edema; redness to the base of right great toe Neuro:  Nonfocal  Psych: Normal affect   Labs    Chemistry Recent Labs  Lab 12/10/17 1135  12/12/17 0350 12/13/17 0401 12/14/17 0318  NA 136   < > 143 142 140  K 4.2   < >  3.6 3.8 3.8  CL 97   < > 116* 112* 108  CO2 30   < > 21* 22 22  GLUCOSE 152*   < > 137* 130* 140*  BUN 25*   < > 12 10 10   CREATININE 1.56*   < > 0.75 0.72 0.74  CALCIUM 10.2   < > 8.1* 8.8* 8.8*  PROT 8.3  --   --   --   --   ALBUMIN 4.1  --   --   --   --   AST 19  --   --   --   --   ALT 10  --   --   --   --   ALKPHOS 60  --   --   --   --   BILITOT 1.6*  --   --   --   --   GFRNONAA  --    < > >60 >60 >60  GFRAA  --    < > >60 >60 >60  ANIONGAP  --    < > 6 8 10    < > = values in this interval not displayed.     Hematology Recent Labs  Lab 12/12/17 0350 12/13/17 0401 12/14/17 0318  WBC 10.0 10.5  12.7*  RBC 3.83* 4.07 3.96  HGB 11.7* 12.4 12.0  HCT 35.8* 37.6 37.0  MCV 93.5 92.4 93.4  MCH 30.5 30.5 30.3  MCHC 32.7 33.0 32.4  RDW 13.1 13.1 13.2  PLT 260 297 336    Cardiac Enzymes Recent Labs  Lab 12/10/17 1539  TROPONINI <0.03   No results for input(s): TROPIPOC in the last 168 hours.   BNPNo results for input(s): BNP, PROBNP in the last 168 hours.   DDimer No results for input(s): DDIMER in the last 168 hours.   Radiology    Dg Chest Port 1 View  Result Date: 12/12/2017 CLINICAL DATA:  Shortness of breath EXAM: PORTABLE CHEST 1 VIEW COMPARISON:  December 10, 2017 FINDINGS: Continued infiltrate in left base consistent with no pneumonia. No pneumothorax. The cardiomediastinal silhouette is stable. No other changes. IMPRESSION: Continued left basilar infiltrate. Recommend follow-up to resolution. Electronically Signed   By: Dorise Bullion III M.D   On: 12/12/2017 16:59    Cardiac Studies   Echocardiogram 12/11/2017 Study Conclusions - Left ventricle: The cavity size was normal. Wall thickness was increased in a pattern of moderate LVH. Systolic function was vigorous. The estimated ejection fraction was in the range of 65% to 70%. Wall motion was normal; there were no regional wall motion abnormalities. The study was not technically sufficient  to allow evaluation of LV diastolic dysfunction due to atrial fibrillation. - Aortic valve: Valve area (VTI): 2.14 cm^2. Valve area (Vmax): 2.43 cm^2. Valve area (Vmean): 2.33 cm^2. - Mitral valve: Valve area by continuity equation (using LVOT flow): 1.61 cm^2. - Atrial septum: No defect or patent foramen ovale was identified  Patient Profile     72 y.o. female with a hx of hypertension, hyperlipidemia and right nephrectomy who is being seen for afib with RVR in setting of lobar pneumonia/sepsis.  Echocardiogram is normal, troponin negative, TSH normal.  Assessment & Plan    New onset atrial fibrillation with RVR -Possibly related to sepsis pneumonia.  No prior cardiac history. -She is on Cardizem drip.  We attempted to transition to oral Cardizem yesterday as her rates were well controlled and even intermittently in sinus rhythm, however her rates went back up to the 140s and the Cardizem drip was continued.  Last evening we increased her metoprolol to 100 mg twice daily and her rates were better during the night.  This morning she is intermittently up to the 120s.  Will reassess rates after her morning dose of metoprolol and hopefully attempt transition to oral diltiazem again today. -She is currently asymptomatic -CHA2DS2/VAS Stroke Risk Score is 3 (HTN, Age, female).  Will transition to Eliquis today 5 mg twice daily.  The patient has been seen by the case manager and given a 30-day free card. -Her A. fib may be situational in setting of pneumonia/sepsis.  For now we are working on rate control as she may convert to sinus rhythm at some point after her pneumonia recovery.  We have arranged follow-up in the office.  If she continues to be in A. fib after 3 weeks of anticoagulation will consider electrical cardioversion.  Essential hypertension -Her hydrochlorothiazide is on hold due to initial acute kidney injury.  She is currently on metoprolol increased from her home dose of 50 mg  to 100 mg for heart rate control and also diltiazem. -Blood pressure is currently stable and well controlled  Sepsis secondary to pneumonia -Judgment per primary team with IV and oral antibiotics.  Hyperlipidemia -Continue simvastatin  For questions or updates, please contact [place] Please consult [email or web address] for contact info under Cardiology/STEMI.      Signed, Daune Perch, NP  12/14/2017, 9:43 AM

## 2017-12-14 NOTE — Progress Notes (Signed)
ANTICOAGULATION CONSULT NOTE - Follow Up Consult  Pharmacy Consult for Enoxaparin >> Apixaban Indication: atrial fibrillation  Allergies  Allergen Reactions  . Ibuprofen Other (See Comments)    Dizziness "made me woozy"    Patient Measurements: Height: 5\' 6"  (167.6 cm) Weight: 166 lb 3.6 oz (75.4 kg) IBW/kg (Calculated) : 59.3 Pt weight: 74kg  Vital Signs: Temp: 98.5 F (36.9 C) (08/13 0735) Temp Source: Oral (08/13 0735) BP: 131/82 (08/13 0800) Pulse Rate: 74 (08/13 0800)  Labs: Recent Labs    12/12/17 0350 12/13/17 0401 12/14/17 0318  HGB 11.7* 12.4 12.0  HCT 35.8* 37.6 37.0  PLT 260 297 336  CREATININE 0.75 0.72 0.74    Estimated Creatinine Clearance: 65 mL/min (by C-G formula based on SCr of 0.74 mg/dL).   Medical History: Past Medical History:  Diagnosis Date  . Arthritis   . Bilateral cataracts    Dr Kathrin Penner  . Hyperlipidemia   . Hypertension      Assessment: 72 year old woman PMH hypertension, hyperlipidemia, presented to the emergency department at the direction of her PCP for elevated WBC.  Was found to have lobar pneumonia, acute kidney injury, atrial fibrillation with rapid ventricular.  Pharmacy consulted to dose enoxaparin for AFib.  No prior anticoagulation noted.  12/14/2017 - transition to apixaban Scr improved 0.74, CrCl ~ 65 mls/min CBC WNL No bleeding reported No significant drug interactions noted  Goal of Therapy:  Therapeutic anticoagulation, prevention of stroke Monitor platelets by anticoagulation protocol   Plan:  DC enoxaparin (last dose ~10A), begin apixaban 5mg  BID at 10P Provided patient with education materials and 30 day coupon voucher No further dosage adjustments anticipated, Pharmacy will sign off  Peggyann Juba, PharmD, BCPS Pager: [phone] 12/14/2017, 10:26 AM

## 2017-12-15 LAB — CBC WITH DIFFERENTIAL/PLATELET
BASOS ABS: 0 10*3/uL (ref 0.0–0.1)
BASOS PCT: 0 %
EOS ABS: 0 10*3/uL (ref 0.0–0.7)
Eosinophils Relative: 0 %
HEMATOCRIT: 40.6 % (ref 36.0–46.0)
HEMOGLOBIN: 13.5 g/dL (ref 12.0–15.0)
Lymphocytes Relative: 6 %
Lymphs Abs: 0.7 10*3/uL (ref 0.7–4.0)
MCH: 30.5 pg (ref 26.0–34.0)
MCHC: 33.3 g/dL (ref 30.0–36.0)
MCV: 91.9 fL (ref 78.0–100.0)
Monocytes Absolute: 0.5 10*3/uL (ref 0.1–1.0)
Monocytes Relative: 4 %
NEUTROS ABS: 10.8 10*3/uL — AB (ref 1.7–7.7)
NEUTROS PCT: 90 %
Platelets: 402 10*3/uL — ABNORMAL HIGH (ref 150–400)
RBC: 4.42 MIL/uL (ref 3.87–5.11)
RDW: 12.9 % (ref 11.5–15.5)
WBC: 12 10*3/uL — AB (ref 4.0–10.5)

## 2017-12-15 LAB — CULTURE, BLOOD (ROUTINE X 2)
CULTURE: NO GROWTH
Culture: NO GROWTH
SPECIAL REQUESTS: ADEQUATE
Special Requests: ADEQUATE

## 2017-12-15 LAB — BASIC METABOLIC PANEL
ANION GAP: 10 (ref 5–15)
BUN: 15 mg/dL (ref 8–23)
CHLORIDE: 109 mmol/L (ref 98–111)
CO2: 23 mmol/L (ref 22–32)
CREATININE: 0.78 mg/dL (ref 0.44–1.00)
Calcium: 9.2 mg/dL (ref 8.9–10.3)
GFR calc non Af Amer: >60 mL/min (ref >60)
Glucose, Bld: 184 mg/dL — ABNORMAL HIGH (ref 70–99)
POTASSIUM: 4 mmol/L (ref 3.5–5.1)
SODIUM: 142 mmol/L (ref 135–145)

## 2017-12-15 LAB — MAGNESIUM: MAGNESIUM: 2 mg/dL (ref 1.7–2.4)

## 2017-12-15 LAB — URIC ACID: URIC ACID, SERUM: 3.7 mg/dL (ref 2.5–7.1)

## 2017-12-15 MED ORDER — CEFDINIR 300 MG PO CAPS
300.0000 mg | ORAL_CAPSULE | Freq: Two times a day (BID) | ORAL | Status: DC
  Administered 2017-12-15 – 2017-12-16 (×3): 300 mg via ORAL
  Filled 2017-12-15 (×3): qty 1

## 2017-12-15 NOTE — Progress Notes (Signed)
Patient ID: Sharon Knight, female   DOB: [birth year], 72 y.o.   MRN: [id number]  PROGRESS NOTE    Shruti Arrey  GNO:[id number] DOB: [birth year] DOA: 12/10/2017 PCP: Binnie Rail, MD   Brief Narrative:  72 year old female with hypertension, hyperlipidemia presented to the emergency department at the direction of her primary care physician for elevated WBC.  She was found to have lobar pneumonia, acute kidney injury, atrial fibrillation with rapid ventricular rate.  She was started on antibiotics.  She had to be started on Cardizem drip.  Cardiology was consulted.   Assessment & Plan:   Principal Problem:   Lobar pneumonia ([place]) Active Problems:   New onset a-fib ([place])   AKI (acute kidney injury) ([place])   Sepsis secondary to pneumonia -Currently hemodynamically improved.  Blood cultures negative so far.  Antibiotic plan as below  Left lobar bacterial pneumonia -Blood cultures negative so far.  Respiratory status stable currently -Currently on Rocephin and Zithromax.  We will switch to cefdinir to complete 7-day course of therapy  New onset atrial fibrillation with rapid ventricular rate -Cardiology following.  Still on Cardizem drip which is being weaned off to oral Cardizem.  Continue metoprolol.  Continue Eliquis as per cardiology -Echo showed EF of 65 to 70% with no regional wall motion abnormality.  Right foot pain -Question of acute gout.  X-ray of right foot was unremarkable.   -Improving.  Continue short course of prednisone.  Uric acid this morning was not elevated  Acute kidney injury -Improved with IV fluids.  Creatinine normal currently.  Essential hypertension -Monitor blood pressure.  Continue Cardizem and metoprolol  Hyperlipidemia -Simvastatin has been switched to atorvastatin because of drug drug interaction of simvastatin with Cardizem    DVT prophylaxis: Eliquis Code Status: Full Family Communication: None at bedside Disposition Plan: Home tomorrow if heart  rate remains stable and cleared by cardiology  Consultants: Cardiology  Procedures: Echo showed EF of 65 to 70% with no regional wall motion abnormality  Antimicrobials: Rocephin and Zithromax from 12/10/2017 onwards   Subjective: Patient seen and examined at bedside.  She feels slightly better.  No overnight fever or vomiting. Feels that her right foot pain and redness are improving. Objective: Vitals:   12/14/17 2100 12/15/17 0000 12/15/17 0400 12/15/17 0800  BP: 139/82 (!) 138/98 105/61 (!) 143/74  Pulse: (!) 157 85 69 90  Resp:  (!) 28 (!) 21 20  Temp:  98.5 F (36.9 C) 97.9 F (36.6 C) (!) 97.4 F (36.3 C)  TempSrc:  Oral Oral Oral  SpO2:  100% 100% 100%  Weight:      Height:        Intake/Output Summary (Last 24 hours) at 12/15/2017 1038 Last data filed at 12/15/2017 0951 Gross per 24 hour  Intake 245.54 ml  Output 750 ml  Net -504.46 ml   Filed Weights   12/10/17 2100  Weight: 75.4 kg    Examination:  General exam: Appears calm and comfortable no acute distress Respiratory system: Bilateral decreased breath sounds at bases  Cardiovascular system: Rate controlled, S1-S2 heard Gastrointestinal system: Abdomen is nondistended, soft and nontender. Normal bowel sounds heard. Extremities: No cyanosis, clubbing, edema.  Right foot erythema and mild tenderness at the base of first toe which is much improved compared to yesterday     Data Reviewed: I have personally reviewed following labs and imaging studies  CBC: Recent Labs  Lab 12/10/17 1135 12/11/17 0354 12/12/17 0350 12/13/17 0401 12/14/17 0318 12/15/17 0341  WBC  21.8 Repeated and verified X2.* 13.6* 10.0 10.5 12.7* 12.0*  NEUTROABS 19.0*  --   --   --   --  10.8*  HGB 15.1* 13.5 11.7* 12.4 12.0 13.5  HCT 43.9 40.7 35.8* 37.6 37.0 40.6  MCV 90.0 92.3 93.5 92.4 93.4 91.9  PLT 284.0 275 260 297 336 726*   Basic Metabolic Panel: Recent Labs  Lab 12/11/17 0354 12/11/17 0730 12/11/17 1138  12/12/17 0350 12/12/17 0940 12/13/17 0401 12/14/17 0318 12/15/17 0341  NA 141  --   --  143  --  142 140 142  K 2.7*  --  3.2* 3.6  --  3.8 3.8 4.0  CL 106  --   --  116*  --  112* 108 109  CO2 23  --   --  21*  --  22 22 23   GLUCOSE 117*  --   --  137*  --  130* 140* 184*  BUN 19  --   --  12  --  10 10 15   CREATININE 1.06*  --   --  0.75  --  0.72 0.74 0.78  CALCIUM 8.4*  --   --  8.1*  --  8.8* 8.8* 9.2  MG  --  1.9  --   --  1.9  --   --  2.0   GFR: Estimated Creatinine Clearance: 65 mL/min (by C-G formula based on SCr of 0.78 mg/dL). Liver Function Tests: Recent Labs  Lab 12/10/17 1135  AST 19  ALT 10  ALKPHOS 60  BILITOT 1.6*  PROT 8.3  ALBUMIN 4.1   No results for input(s): LIPASE, AMYLASE in the last 168 hours. No results for input(s): AMMONIA in the last 168 hours. Coagulation Profile: No results for input(s): INR, PROTIME in the last 168 hours. Cardiac Enzymes: Recent Labs  Lab 12/10/17 1539  TROPONINI <0.03   BNP (last 3 results) No results for input(s): PROBNP in the last 8760 hours. HbA1C: No results for input(s): HGBA1C in the last 72 hours. CBG: No results for input(s): GLUCAP in the last 168 hours. Lipid Profile: No results for input(s): CHOL, HDL, LDLCALC, TRIG, CHOLHDL, LDLDIRECT in the last 72 hours. Thyroid Function Tests: No results for input(s): TSH, T4TOTAL, FREET4, T3FREE, THYROIDAB in the last 72 hours. Anemia Panel: No results for input(s): VITAMINB12, FOLATE, FERRITIN, TIBC, IRON, RETICCTPCT in the last 72 hours. Sepsis Labs: Recent Labs  Lab 12/10/17 1413 12/10/17 1548  LATICACIDVEN 1.58 1.88    Recent Results (from the past 240 hour(s))  Blood Culture (routine x 2)     Status: None (Preliminary result)   Collection Time: 12/10/17  3:39 PM  Result Value Ref Range Status   Specimen Description   Final    BLOOD RIGHT ANTECUBITAL Performed at [facility] [address] [place], [place] [address]     Special Requests   Final    BOTTLES DRAWN AEROBIC AND ANAEROBIC Blood Culture adequate volume Performed at [facility] [address], [place], [place] [address]    Culture   Final    NO GROWTH 4 DAYS Performed at [facility], [place] [address] [place],  [address]    Report Status PENDING  Incomplete  Blood Culture (routine x 2)     Status: None (Preliminary result)   Collection Time: 12/10/17  3:39 PM  Result Value Ref Range Status   Specimen Description   Final    BLOOD RIGHT ANTECUBITAL Performed at  [facility], [place] [address] [place], [place] [address]    Special Requests   Final    BOTTLES DRAWN AEROBIC AND ANAEROBIC Blood Culture adequate volume Performed at [facility] [address], [place], [place] [address]    Culture   Final    NO GROWTH 4 DAYS Performed at [facility], [place] [address] [place], [place] [address]    Report Status PENDING  Incomplete  MRSA PCR Screening     Status: None   Collection Time: 12/11/17  2:47 AM  Result Value Ref Range Status   MRSA by PCR NEGATIVE NEGATIVE Final    Comment:        The GeneXpert MRSA Assay (FDA approved for NASAL specimens only), is one component of a comprehensive MRSA colonization surveillance program. It is not intended to diagnose MRSA infection nor to guide or monitor treatment for MRSA infections. Performed at [facility], [place] [address], [place], [place] [address]   Culture, sputum-assessment     Status: None   Collection Time: 12/11/17  6:26 AM  Result Value Ref Range Status   Specimen Description SPUTUM  Final   Special Requests NONE  Final   Sputum evaluation   Final    THIS SPECIMEN IS ACCEPTABLE FOR SPUTUM CULTURE Performed at [facility], [place] [address], [place], [place] [address]    Report Status 12/11/2017 FINAL  Final  Culture, respiratory     Status: None   Collection Time:  12/11/17  6:26 AM  Result Value Ref Range Status   Specimen Description   Final    SPUTUM Performed at [facility] [address] [place], [place] [address]    Special Requests   Final    NONE Reflexed from [id number] Performed at [facility], [place] [address] [place], [place] [address]    Gram Stain   Final    FEW WBC PRESENT, PREDOMINANTLY PMN FEW GRAM POSITIVE COCCI IN CLUSTERS RARE GRAM NEGATIVE RODS RARE GRAM POSITIVE RODS Performed at [facility], [place] [address] [place], [place] [address]    Culture FEW CANDIDA ALBICANS  Final   Report Status 12/14/2017 FINAL  Final         Radiology Studies: Dg Foot 2 Views Right  Result Date: 12/14/2017 CLINICAL DATA:  Right foot pain. EXAM: RIGHT FOOT - 2 VIEW COMPARISON:  None. FINDINGS: There is no evidence of fracture or dislocation. There is no evidence of arthropathy or other focal bone abnormality. Soft tissues are unremarkable. IMPRESSION: Normal right foot. Electronically Signed   By: Marijo Conception, M.D.   On: 12/14/2017 13:21        Scheduled Meds: . apixaban  5 mg Oral BID  . atorvastatin  10 mg Oral q1800  . azithromycin  500 mg Oral q1800  . diltiazem  240 mg Oral Daily  . metoprolol tartrate  100 mg Oral BID  . predniSONE  60 mg Oral Q breakfast  . sodium chloride flush  3 mL Intravenous Q12H   Continuous Infusions: . cefTRIAXone (ROCEPHIN)  IV Stopped (12/14/17 1640)  . diltiazem (CARDIZEM) infusion 5 mg/hr (12/15/17 0552)     LOS: 5 days        Aline August, MD [facility] Pager [phone]  If 7PM-7AM, please contact night-coverage [email or web address] Password [facility] 12/15/2017, 10:38 AM

## 2017-12-15 NOTE — Evaluation (Signed)
Physical Therapy Evaluation Patient Details Name: Sharon Knight MRN: [id number] DOB: March 16, 1946 Today's Date: 12/15/2017   History of Present Illness  72 yo female admitted with Pna, sepsis, Afib with RVR, R foot pain. Hx of HTN  Clinical Impression  On eval, pt was Min guard assist for mobility. She walked ~250 feet with a RW. Pt tolerated activity well. O2 sats were >/= 90% on RA. HR fluctuated throughout distance: mostly 90s-100s with intermittent brief (4-5 seconds) spikes to 120s-130s. Encouraged pt to walk with nursing as able. Do not anticipate any f/u PT needs at this time. Recommend continued RW use until pt returns to baseline.      Follow Up Recommendations Supervision for mobility/OOB    Equipment Recommendations  None recommended by PT    Recommendations for Other Services       Precautions / Restrictions Precautions Precautions: Fall Precaution Comments: monitor HR Restrictions Weight Bearing Restrictions: No      Mobility  Bed Mobility               General bed mobility comments: oob in recliner  Transfers Overall transfer level: Needs assistance Equipment used: Rolling walker (2 wheeled) Transfers: Sit to/from Stand Sit to Stand: Supervision         General transfer comment: for safety, lines  Ambulation/Gait Ambulation/Gait assistance: Min guard Gait Distance (Feet): 250 Feet Assistive device: Rolling walker (2 wheeled) Gait Pattern/deviations: Step-through pattern     General Gait Details: close guard for safety. HR fluctuated throughout distance: mostly 90s-100s with intermittent brief(2-3 seconds) spikes to 120s-130s  Stairs            Wheelchair Mobility    Modified Rankin (Stroke Patients Only)       Balance Overall balance assessment: Mild deficits observed, not formally tested                                           Pertinent Vitals/Pain Pain Assessment: Faces Faces Pain Scale: Hurts little  more Pain Location: bil heels Pain Descriptors / Indicators: Sore Pain Intervention(s): Monitored during session    Home Living Family/patient expects to be discharged to:: Private residence Living Arrangements: Spouse/significant other   Type of Home: House Home Access: Stairs to enter Entrance Stairs-Rails: Right Entrance Stairs-Number of Steps: 4 Home Layout: One level Home Equipment: Environmental consultant - 2 wheels;Cane - single point      Prior Function Level of Independence: Independent               Hand Dominance        Extremity/Trunk Assessment   Upper Extremity Assessment Upper Extremity Assessment: Overall WFL for tasks assessed    Lower Extremity Assessment Lower Extremity Assessment: Generalized weakness    Cervical / Trunk Assessment Cervical / Trunk Assessment: Normal  Communication   Communication: No difficulties  Cognition Arousal/Alertness: Awake/alert Behavior During Therapy: WFL for tasks assessed/performed Overall Cognitive Status: Within Functional Limits for tasks assessed                                        General Comments      Exercises     Assessment/Plan    PT Assessment Patient needs continued PT services  PT Problem List Decreased mobility;Decreased activity tolerance  PT Treatment Interventions Gait training;Functional mobility training;Therapeutic activities;Balance training;Patient/family education;DME instruction;Therapeutic exercise    PT Goals (Current goals can be found in the Care Plan section)  Acute Rehab PT Goals Patient Stated Goal: home. regain PLOF.  PT Goal Formulation: With patient Time For Goal Achievement: 12/29/17 Potential to Achieve Goals: Good    Frequency Min 3X/week   Barriers to discharge        Co-evaluation               AM-PAC PT "6 Clicks" Daily Activity  Outcome Measure Difficulty turning over in bed (including adjusting bedclothes, sheets and blankets)?: A  Little Difficulty moving from lying on back to sitting on the side of the bed? : A Little Difficulty sitting down on and standing up from a chair with arms (e.g., wheelchair, bedside commode, etc,.)?: A Little Help needed moving to and from a bed to chair (including a wheelchair)?: A Little Help needed walking in hospital room?: A Little Help needed climbing 3-5 steps with a railing? : A Little 6 Click Score: 18    End of Session   Activity Tolerance: Patient tolerated treatment well Patient left: in chair;with call bell/phone within reach   PT Visit Diagnosis: Unsteadiness on feet (R26.81)    Time: 5956-3875 PT Time Calculation (min) (ACUTE ONLY): 17 min   Charges:   PT Evaluation $PT Eval Moderate Complexity: 1 Mod            Weston Anna, MPT Pager: [phone]

## 2017-12-15 NOTE — Progress Notes (Addendum)
Progress Note  Patient Name: Sharon Knight Date of Encounter: 12/15/2017  Primary Cardiologist: Skeet Latch, MD   Subjective   Patient states that she "feels better than ever". She denies chest discomfort, shortness of breath, palpitations or lightheadedness. She has been out of bed but not up walking. She has felt well when up.  Her right foot pain is better since starting prednisone yesterday. Xray was normal.  Inpatient Medications    Scheduled Meds: . apixaban  5 mg Oral BID  . atorvastatin  10 mg Oral q1800  . azithromycin  500 mg Oral q1800  . diltiazem  240 mg Oral Daily  . metoprolol tartrate  100 mg Oral BID  . predniSONE  60 mg Oral Q breakfast  . sodium chloride flush  3 mL Intravenous Q12H   Continuous Infusions: . cefTRIAXone (ROCEPHIN)  IV Stopped (12/14/17 1640)  . diltiazem (CARDIZEM) infusion 5 mg/hr (12/15/17 0552)   PRN Meds: acetaminophen **OR** acetaminophen, ALPRAZolam, calcium carbonate, indomethacin, ondansetron **OR** ondansetron (ZOFRAN) IV   Vital Signs    Vitals:   12/14/17 2000 12/14/17 2100 12/15/17 0000 12/15/17 0400  BP: 139/82 139/82 (!) 138/98 105/61  Pulse:  (!) 157 85 69  Resp: (!) 26  (!) 28 (!) 21  Temp:   98.5 F (36.9 C) 97.9 F (36.6 C)  TempSrc:   Oral Oral  SpO2: 99%  100% 100%  Weight:      Height:        Intake/Output Summary (Last 24 hours) at 12/15/2017 0806 Last data filed at 12/15/2017 0414 Gross per 24 hour  Intake 242.54 ml  Output 750 ml  Net -507.46 ml   Filed Weights   12/10/17 2100  Weight: 75.4 kg    Telemetry    Atrial fibrillation with rates in the 70's-80's- Personally Reviewed  ECG    No new tracings- Personally Reviewed  Physical Exam   GEN: No acute distress.   Neck: No JVD Cardiac:  Irregularly irregular rhythm, no murmurs, rubs, or gallops.  Respiratory: Clear to auscultation bilaterally. GI: Soft, nontender, non-distended  MS: No edema; redness to the base of right great  toe Neuro:  Nonfocal  Psych: Normal affect   Labs    Chemistry Recent Labs  Lab 12/10/17 1135  12/13/17 0401 12/14/17 0318 12/15/17 0341  NA 136   < > 142 140 142  K 4.2   < > 3.8 3.8 4.0  CL 97   < > 112* 108 109  CO2 30   < > 22 22 23   GLUCOSE 152*   < > 130* 140* 184*  BUN 25*   < > 10 10 15   CREATININE 1.56*   < > 0.72 0.74 0.78  CALCIUM 10.2   < > 8.8* 8.8* 9.2  PROT 8.3  --   --   --   --   ALBUMIN 4.1  --   --   --   --   AST 19  --   --   --   --   ALT 10  --   --   --   --   ALKPHOS 60  --   --   --   --   BILITOT 1.6*  --   --   --   --   GFRNONAA  --    < > >60 >60 >60  GFRAA  --    < > >60 >60 >60  ANIONGAP  --    < >  8 10 10    < > = values in this interval not displayed.     Hematology Recent Labs  Lab 12/13/17 0401 12/14/17 0318 12/15/17 0341  WBC 10.5 12.7* 12.0*  RBC 4.07 3.96 4.42  HGB 12.4 12.0 13.5  HCT 37.6 37.0 40.6  MCV 92.4 93.4 91.9  MCH 30.5 30.3 30.5  MCHC 33.0 32.4 33.3  RDW 13.1 13.2 12.9  PLT 297 336 402*    Cardiac Enzymes Recent Labs  Lab 12/10/17 1539  TROPONINI <0.03   No results for input(s): TROPIPOC in the last 168 hours.   BNPNo results for input(s): BNP, PROBNP in the last 168 hours.   DDimer No results for input(s): DDIMER in the last 168 hours.   Radiology    Dg Foot 2 Views Right  Result Date: 12/14/2017 CLINICAL DATA:  Right foot pain. EXAM: RIGHT FOOT - 2 VIEW COMPARISON:  None. FINDINGS: There is no evidence of fracture or dislocation. There is no evidence of arthropathy or other focal bone abnormality. Soft tissues are unremarkable. IMPRESSION: Normal right foot. Electronically Signed   By: Marijo Conception, M.D.   On: 12/14/2017 13:21    Cardiac Studies   Echocardiogram 12/11/2017 Study Conclusions - Left ventricle: The cavity size was normal. Wall thickness was increased in a pattern of moderate LVH. Systolic function was vigorous. The estimated ejection fraction was in the range of 65% to  70%. Wall motion was normal; there were no regional wall motion abnormalities. The study was not technically sufficient to allow evaluation of LV diastolic dysfunction due to atrial fibrillation. - Aortic valve: Valve area (VTI): 2.14 cm^2. Valve area (Vmax): 2.43 cm^2. Valve area (Vmean): 2.33 cm^2. - Mitral valve: Valve area by continuity equation (using LVOT flow): 1.61 cm^2. - Atrial septum: No defect or patent foramen ovale was identified  Patient Profile     72 y.o. female with a hx of hypertension, hyperlipidemia and right nephrectomy who is being seen for afib with RVR in setting of lobar pneumonia/sepsis.  Echocardiogram is normal, troponin negative, TSH normal.  Assessment & Plan    New onset atrial fibrillation with RVR -Possibly related to sepsis pneumonia.  No prior cardiac history. -Overnight HR has been in the 70's-80's. Pt is tolerating the afib well.  -Trying to transition cardizem drip to oral dosing. Staff instructed to give oral dose and stop drip.  -CHA2DS2/VAS Stroke Risk Score is 3 (HTN, Age, female).  inititated Eliquis 5 mg twice daily.  The patient has been seen by the case manager and given a 30-day free card. -Her A. fib may be situational in setting of pneumonia/sepsis.  For now we are working on rate control as she may convert to sinus rhythm at some point after her pneumonia recovery.  We have arranged follow-up in the office.  If she continues to be in A. fib after 3-4 weeks of anticoagulation will consider electrical cardioversion.  Essential hypertension -Her hydrochlorothiazide is on hold due to initial acute kidney injury.  She is currently on metoprolol increased from her home dose of 50 mg to 100 mg for heart rate control and also diltiazem. -Blood pressure is currently stable and well controlled  Sepsis secondary to pneumonia -Management per primary team with IV and oral antibiotics.  Hyperlipidemia -Continue simvastatin     For  questions or updates, please contact [place] Please consult [email or web address] for contact info under Cardiology/STEMI.      Signed, Daune Perch, NP  12/15/2017, 8:06 AM  Attending Addendum:  History and all data above reviewed.  Patient examined.  I agree with the findings as above.  All available labs, radiology testing, previous records reviewed. Agree with documented assessment and plan.  Sharon Knight is a 51F with hypertension, hyperlipidemia and R nephrectomy here with new onset atrial fibrillation with RVR in the setting of pneumonia and sepsis.  Echo revealed normal systolic function. For unclear reasons she never received oral diltiazem.  She received it today and is off the diltiazem drip.  Her rate is well-controlled. She is on Eliquis for anticoagulation.  If she is symptomatic she could be considered for DCCV in 3-4 weeks.  This is likely related to her pneumonia.  Sharon Knight is stable for discharge from a cardiac standpoint.  [facility] will sign off.   Medication Recommendations:  Continue current medications. Other recommendations (labs, testing, etc):  none Follow up as an outpatient:  We will arrange cardiology follow up.     Tayler Lassen C. Oval Linsey, MD, [facility]  12/15/2017 2:05 PM

## 2017-12-15 NOTE — Care Management Note (Signed)
Case Management Note  Patient Details  Name: Francile Woolford MRN: [id number] Date of Birth: 1945/08/14  Subjective/Objective:     Pna, and new onset a.fib/remains on iv rocephin and iv cardizem/               Action/Plan: Following for cm needs and progression  Expected Discharge Date:  (unknown)               Expected Discharge Plan:     In-House Referral:     Discharge planning Services     Post Acute Care Choice:    Choice offered to:     DME Arranged:    DME Agency:     HH Arranged:    HH Agency:     Status of Service:     If discussed at [employer], dates discussed:    Additional Comments:  Leeroy Cha, RN 12/15/2017, 8:42 AM

## 2017-12-15 NOTE — Telephone Encounter (Signed)
Patient currently in hospital.

## 2017-12-16 ENCOUNTER — Encounter (HOSPITAL_COMMUNITY): Payer: Self-pay | Admitting: *Deleted

## 2017-12-16 ENCOUNTER — Telehealth: Payer: Self-pay | Admitting: *Deleted

## 2017-12-16 LAB — BASIC METABOLIC PANEL
Anion gap: 8 (ref 5–15)
BUN: 23 mg/dL (ref 8–23)
CO2: 24 mmol/L (ref 22–32)
Calcium: 9.1 mg/dL (ref 8.9–10.3)
Chloride: 110 mmol/L (ref 98–111)
Creatinine, Ser: 0.88 mg/dL (ref 0.44–1.00)
GFR calc Af Amer: >60 mL/min (ref >60)
GLUCOSE: 151 mg/dL — AB (ref 70–99)
POTASSIUM: 3.9 mmol/L (ref 3.5–5.1)
Sodium: 142 mmol/L (ref 135–145)

## 2017-12-16 LAB — MAGNESIUM: Magnesium: 2.2 mg/dL (ref 1.7–2.4)

## 2017-12-16 LAB — CBC WITH DIFFERENTIAL/PLATELET
Basophils Absolute: 0 10*3/uL (ref 0.0–0.1)
Basophils Relative: 0 %
EOS ABS: 0 10*3/uL (ref 0.0–0.7)
EOS PCT: 0 %
HCT: 38.7 % (ref 36.0–46.0)
Hemoglobin: 12.8 g/dL (ref 12.0–15.0)
Lymphocytes Relative: 10 %
Lymphs Abs: 1.4 10*3/uL (ref 0.7–4.0)
MCH: 30.5 pg (ref 26.0–34.0)
MCHC: 33.1 g/dL (ref 30.0–36.0)
MCV: 92.1 fL (ref 78.0–100.0)
MONO ABS: 1.3 10*3/uL — AB (ref 0.1–1.0)
Monocytes Relative: 9 %
NEUTROS PCT: 81 %
Neutro Abs: 11.2 10*3/uL — ABNORMAL HIGH (ref 1.7–7.7)
PLATELETS: 452 10*3/uL — AB (ref 150–400)
RBC: 4.2 MIL/uL (ref 3.87–5.11)
RDW: 12.8 % (ref 11.5–15.5)
WBC: 13.9 10*3/uL — ABNORMAL HIGH (ref 4.0–10.5)

## 2017-12-16 MED ORDER — APIXABAN 5 MG PO TABS: 5.0000 mg | ORAL_TABLET | Freq: Two times a day (BID) | ORAL | 0 refills | Status: DC

## 2017-12-16 MED ORDER — ACETAMINOPHEN 325 MG PO TABS: 325.0000 mg | ORAL_TABLET | Freq: Two times a day (BID) | ORAL | Status: DC | PRN

## 2017-12-16 MED ORDER — DILTIAZEM HCL ER COATED BEADS 240 MG PO CP24: 240.0000 mg | ORAL_CAPSULE | Freq: Every day | ORAL | 0 refills | Status: DC

## 2017-12-16 MED ORDER — ATORVASTATIN CALCIUM 10 MG PO TABS: 10.0000 mg | ORAL_TABLET | Freq: Every day | ORAL | 0 refills | Status: DC

## 2017-12-16 MED ORDER — METOPROLOL TARTRATE 100 MG PO TABS: 100.0000 mg | ORAL_TABLET | Freq: Two times a day (BID) | ORAL | 0 refills | Status: DC

## 2017-12-16 MED ORDER — PREDNISONE 20 MG PO TABS: 40.0000 mg | ORAL_TABLET | Freq: Every day | ORAL | 0 refills | Status: AC

## 2017-12-16 NOTE — Discharge Summary (Signed)
Physician Discharge Summary  Laurita Peron CVE:[id number] DOB: May 02, 1946 DOA: 12/10/2017  PCP: Binnie Rail, MD  Admit date: 12/10/2017 Discharge date: 12/16/2017  Admitted From: Home Disposition: Home  Recommendations for Outpatient Follow-up:  1. Follow up with PCP in 1-2 weeks 2. Follow-up with cardiology as an outpatient   Home Health: No Equipment/Devices: None  Discharge Condition: Stable CODE STATUS: Full Diet recommendation: Heart Healthy    Brief/Interim Summary: 72 year old female with hypertension, hyperlipidemia presented to the emergency department at the direction of her primary care physician for elevated WBC.  She was found to have lobar pneumonia, acute kidney injury, atrial fibrillation with rapid ventricular rate.  She was started on antibiotics.  She had to be started on Cardizem drip.  Cardiology was consulted.  She has completed a course of antibiotics.  She was switched to oral Cardizem and started on Eliquis by cardiology.  She was also put on oral prednisone for probable acute gout flareup.  She is stable for discharge.  Discharge Diagnoses:  Principal Problem:   Lobar pneumonia ([place]) Active Problems:   New onset a-fib ([place])   AKI (acute kidney injury) ([place])  Sepsis secondary to pneumonia -Resolved.  Currently hemodynamically improved.  Blood cultures negative so far.  Antibiotic plan as below  Left lobar bacterial pneumonia -Blood cultures negative so far.  Respiratory status stable currently -Was started on Rocephin and Zithromax which was switched to cefdinir yesterday.  Patient has completed a course of antibiotics and will not be sent home on any more antibiotics.  New onset atrial fibrillation with rapid ventricular rate -Currently rate controlled  -cardiology evaluation and follow-up appreciated.  Patient was initially started on Cardizem drip which was switched to oral Cardizem.  Continue current dose of oral Cardizem and metoprolol 100 mg twice  daily.  Continue Eliquis.  Outpatient follow-up with cardiology  -Echo showed EF of 65 to 70% with no regional wall motion abnormality.  Right foot pain -Question of acute gout.  X-ray of right foot was unremarkable.   -Improving.  Continue short course of prednisone for 5 more days.  Uric acid  was not elevated.  NSAIDs/colchicine was avoided because of acute kidney injury on presentation and patient has history of nephrectomy.  Outpatient follow-up with PCP  Acute kidney injury -Improved with IV fluids.  Creatinine normal currently.  Essential hypertension -Monitor blood pressure.  Continue Cardizem and metoprolol  Hyperlipidemia -Simvastatin has been switched to atorvastatin because of drug drug interaction of simvastatin with Cardizem  Discharge Instructions  Discharge Instructions    Ambulatory referral to Cardiology   Complete by:  As directed    Follow up for Afib   Call MD for:  difficulty breathing, headache or visual disturbances   Complete by:  As directed    Call MD for:  extreme fatigue   Complete by:  As directed    Call MD for:  hives   Complete by:  As directed    Call MD for:  persistant dizziness or light-headedness   Complete by:  As directed    Call MD for:  persistant nausea and vomiting   Complete by:  As directed    Call MD for:  severe uncontrolled pain   Complete by:  As directed    Call MD for:  temperature >100.4   Complete by:  As directed    Diet - low sodium heart healthy   Complete by:  As directed    Increase activity slowly   Complete by:  As directed      Allergies as of 12/16/2017      Reactions   Ibuprofen Other (See Comments)   Dizziness "made me woozy"      Medication List    STOP taking these medications   simvastatin 40 MG tablet Commonly known as:  ZOCOR     TAKE these medications   acetaminophen 325 MG tablet Commonly known as:  TYLENOL Take 1 tablet (325 mg total) by mouth 2 (two) times daily as needed (pain.).    apixaban 5 MG Tabs tablet Commonly known as:  ELIQUIS Take 1 tablet (5 mg total) by mouth 2 (two) times daily.   atorvastatin 10 MG tablet Commonly known as:  LIPITOR Take 1 tablet (10 mg total) by mouth daily at 6 PM.   diltiazem 240 MG 24 hr capsule Commonly known as:  CARDIZEM CD Take 1 capsule (240 mg total) by mouth daily.   hydrochlorothiazide 25 MG tablet Commonly known as:  HYDRODIURIL TAKE (1/2) TABLET DAILY. What changed:  See the new instructions.   metoprolol tartrate 100 MG tablet Commonly known as:  LOPRESSOR Take 1 tablet (100 mg total) by mouth 2 (two) times daily. What changed:    how much to take  how to take this  when to take this  additional instructions   multivitamin tablet Take 1 tablet by mouth daily.   predniSONE 20 MG tablet Commonly known as:  DELTASONE Take 2 tablets (40 mg total) by mouth daily for 7 days.   STOOL SOFTENER PO Take 2-3 capsules by mouth See admin instructions. Every night at bedtime.      Follow-up Information    Almyra Deforest, [place] Follow up.   Specialties:  Cardiology, Radiology Why:  Cardiology hospital follow-up with Dr. Blenda Mounts PA on Wednesday, August 21 at 11:00 AM.  Please arrive 15 minutes early for check-in. Contact information: [address] [place] [address] [phone]        Binnie Rail, MD. Schedule an appointment as soon as possible for a visit in 1 week(s).   Specialty:  Internal Medicine Contact information: [place] [address] [phone]        Skeet Latch, MD .   Specialty:  Cardiology Contact information: [address] [place] [address] [phone]          Allergies  Allergen Reactions  . Ibuprofen Other (See Comments)    Dizziness "made me woozy"    Consultations:  Cardiology   Procedures/Studies: Dg Chest 2 View  Result Date: 12/10/2017 CLINICAL DATA:  Vomiting EXAM: CHEST - 2 VIEW COMPARISON:  None.  FINDINGS: There are airspace opacities towards the base of the left lower lobe. Lungs otherwise clear. Heart is normal in size. No pneumothorax or pleural effusion. IMPRESSION: Left lower lobe pneumonia. Followup PA and lateral chest X-ray is recommended in 3-4 weeks following trial of antibiotic therapy to ensure resolution and exclude underlying malignancy. Electronically Signed   By: Marybelle Killings M.D.   On: 12/10/2017 14:45   Dg Chest Port 1 View  Result Date: 12/12/2017 CLINICAL DATA:  Shortness of breath EXAM: PORTABLE CHEST 1 VIEW COMPARISON:  December 10, 2017 FINDINGS: Continued infiltrate in left base consistent with no pneumonia. No pneumothorax. The cardiomediastinal silhouette is stable. No other changes. IMPRESSION: Continued left basilar infiltrate. Recommend follow-up to resolution. Electronically Signed   By: Dorise Bullion III M.D   On: 12/12/2017 16:59   Dg Foot 2 Views  Right  Result Date: 12/14/2017 CLINICAL DATA:  Right foot pain. EXAM: RIGHT FOOT - 2 VIEW COMPARISON:  None. FINDINGS: There is no evidence of fracture or dislocation. There is no evidence of arthropathy or other focal bone abnormality. Soft tissues are unremarkable. IMPRESSION: Normal right foot. Electronically Signed   By: Marijo Conception, M.D.   On: 12/14/2017 13:21    Echo showed EF of 65 to 70% with no regional wall motion abnormality   Subjective: Patient seen and examined at bedside.  She feels much better and is okay to go home.  No overnight fever, nausea, worsening shortness of breath.  Her right foot pain and redness are improving.  She is ablating well.  Discharge Exam: Vitals:   12/16/17 0400 12/16/17 0800  BP:  (!) 153/86  Pulse:  84  Resp:  19  Temp: 98 F (36.7 C) (!) 97.5 F (36.4 C)  SpO2:  100%   Vitals:   12/16/17 0000 12/16/17 0200 12/16/17 0400 12/16/17 0800  BP: 127/90 128/64  (!) 153/86  Pulse: (!) 39 84  84  Resp: 20 16  19   Temp: 98.5 F (36.9 C)  98 F (36.7 C) (!) 97.5 F  (36.4 C)  TempSrc: Oral  Oral Oral  SpO2: 100% 96%  100%  Weight:      Height:        General: Pt is alert, awake, not in acute distress Cardiovascular: rate controlled, S1/S2 + Respiratory: bilateral decreased breath sounds at bases Abdominal: Soft, NT, ND, bowel sounds + Extremities: no edema, no cyanosis; right foot erythema and mild tenderness at the base of first toe which is much improved since couple of days ago    The results of significant diagnostics from this hospitalization (including imaging, microbiology, ancillary and laboratory) are listed below for reference.     Microbiology: Recent Results (from the past 240 hour(s))  Blood Culture (routine x 2)     Status: None   Collection Time: 12/10/17  3:39 PM  Result Value Ref Range Status   Specimen Description   Final    BLOOD RIGHT ANTECUBITAL Performed at [facility] [address], [place], [place] [address]    Special Requests   Final    BOTTLES DRAWN AEROBIC AND ANAEROBIC Blood Culture adequate volume Performed at [facility] [address], [place], [place] [address]    Culture   Final    NO GROWTH 5 DAYS Performed at [facility], [place] [address], [place], [place] [address]    Report Status 12/15/2017 FINAL  Final  Blood Culture (routine x 2)     Status: None   Collection Time: 12/10/17  3:39 PM  Result Value Ref Range Status   Specimen Description   Final    BLOOD RIGHT ANTECUBITAL Performed at [facility] [address], [place], [place] [address]    Special Requests   Final    BOTTLES DRAWN AEROBIC AND ANAEROBIC Blood Culture adequate volume Performed at [facility] [address], [place], [place] [address]    Culture   Final    NO GROWTH 5 DAYS Performed at [facility], [place] [address] [place], [place] [address]    Report Status 12/15/2017 FINAL  Final  MRSA PCR Screening     Status: None    Collection Time: 12/11/17  2:47 AM  Result Value Ref Range Status   MRSA by PCR NEGATIVE NEGATIVE Final  Comment:        The GeneXpert MRSA Assay (FDA approved for NASAL specimens only), is one component of a comprehensive MRSA colonization surveillance program. It is not intended to diagnose MRSA infection nor to guide or monitor treatment for MRSA infections. Performed at [facility], [place] [address], [place], [place] [address]   Culture, sputum-assessment     Status: None   Collection Time: 12/11/17  6:26 AM  Result Value Ref Range Status   Specimen Description SPUTUM  Final   Special Requests NONE  Final   Sputum evaluation   Final    THIS SPECIMEN IS ACCEPTABLE FOR SPUTUM CULTURE Performed at [facility], [place] [address], [place], [place] [address]    Report Status 12/11/2017 FINAL  Final  Culture, respiratory     Status: None   Collection Time: 12/11/17  6:26 AM  Result Value Ref Range Status   Specimen Description   Final    SPUTUM Performed at [facility] [address], [place], [place] [address]    Special Requests   Final    NONE Reflexed from [id number] Performed at [facility], [place] [address] [place], [place] [address]    Gram Stain   Final    FEW WBC PRESENT, PREDOMINANTLY PMN FEW GRAM POSITIVE COCCI IN CLUSTERS RARE GRAM NEGATIVE RODS RARE GRAM POSITIVE RODS Performed at [facility], [place] [address], [place],  [address]    Culture FEW CANDIDA ALBICANS  Final   Report Status 12/14/2017 FINAL  Final     Labs: BNP (last 3 results) No results for input(s): BNP in the last 8760 hours. Basic Metabolic Panel: Recent Labs  Lab 12/11/17 0730  12/12/17 0350 12/12/17 0940 12/13/17 0401 12/14/17 0318 12/15/17 0341 12/16/17 0333  NA  --   --  143  --  142 140 142 142  K  --    < > 3.6  --  3.8 3.8 4.0 3.9  CL  --   --  116*  --  112* 108 109 110  CO2  --   --   21*  --  22 22 23 24   GLUCOSE  --   --  137*  --  130* 140* 184* 151*  BUN  --   --  12  --  10 10 15 23   CREATININE  --   --  0.75  --  0.72 0.74 0.78 0.88  CALCIUM  --   --  8.1*  --  8.8* 8.8* 9.2 9.1  MG 1.9  --   --  1.9  --   --  2.0 2.2   < > = values in this interval not displayed.   Liver Function Tests: Recent Labs  Lab 12/10/17 1135  AST 19  ALT 10  ALKPHOS 60  BILITOT 1.6*  PROT 8.3  ALBUMIN 4.1   No results for input(s): LIPASE, AMYLASE in the last 168 hours. No results for input(s): AMMONIA in the last 168 hours. CBC: Recent Labs  Lab 12/10/17 1135  12/12/17 0350 12/13/17 0401 12/14/17 0318 12/15/17 0341 12/16/17 0333  WBC 21.8 Repeated and verified X2.*   < > 10.0 10.5 12.7* 12.0* 13.9*  NEUTROABS 19.0*  --   --   --   --  10.8* 11.2*  HGB 15.1*   < > 11.7* 12.4 12.0 13.5 12.8  HCT 43.9   < > 35.8* 37.6 37.0 40.6 38.7  MCV 90.0   < >  93.5 92.4 93.4 91.9 92.1  PLT 284.0   < > 260 297 336 402* 452*   < > = values in this interval not displayed.   Cardiac Enzymes: Recent Labs  Lab 12/10/17 1539  TROPONINI <0.03   BNP: Invalid input(s): POCBNP CBG: No results for input(s): GLUCAP in the last 168 hours. D-Dimer No results for input(s): DDIMER in the last 72 hours. Hgb A1c No results for input(s): HGBA1C in the last 72 hours. Lipid Profile No results for input(s): CHOL, HDL, LDLCALC, TRIG, CHOLHDL, LDLDIRECT in the last 72 hours. Thyroid function studies No results for input(s): TSH, T4TOTAL, T3FREE, THYROIDAB in the last 72 hours.  Invalid input(s): FREET3 Anemia work up No results for input(s): VITAMINB12, FOLATE, FERRITIN, TIBC, IRON, RETICCTPCT in the last 72 hours. Urinalysis    Component Value Date/Time   COLORURINE YELLOW 12/10/2017 1730   APPEARANCEUR CLEAR 12/10/2017 1730   LABSPEC 1.005 12/10/2017 1730   PHURINE 6.0 12/10/2017 1730   GLUCOSEU NEGATIVE 12/10/2017 1730   GLUCOSEU NEGATIVE 12/10/2017 1146   HGBUR SMALL (A)  12/10/2017 1730   BILIRUBINUR NEGATIVE 12/10/2017 1730   KETONESUR NEGATIVE 12/10/2017 1730   PROTEINUR NEGATIVE 12/10/2017 1730   UROBILINOGEN 1.0 12/10/2017 1146   NITRITE NEGATIVE 12/10/2017 1730   LEUKOCYTESUR SMALL (A) 12/10/2017 1730   Sepsis Labs Invalid input(s): PROCALCITONIN,  WBC,  LACTICIDVEN Microbiology Recent Results (from the past 240 hour(s))  Blood Culture (routine x 2)     Status: None   Collection Time: 12/10/17  3:39 PM  Result Value Ref Range Status   Specimen Description   Final    BLOOD RIGHT ANTECUBITAL Performed at [facility], [place] [address] [place], [place] [address]    Special Requests   Final    BOTTLES DRAWN AEROBIC AND ANAEROBIC Blood Culture adequate volume Performed at [facility] [address] [place], [place] [address]    Culture   Final    NO GROWTH 5 DAYS Performed at [facility], [place] [address], [place], [place] [address]    Report Status 12/15/2017 FINAL  Final  Blood Culture (routine x 2)     Status: None   Collection Time: 12/10/17  3:39 PM  Result Value Ref Range Status   Specimen Description   Final    BLOOD RIGHT ANTECUBITAL Performed at [facility] [address] [place], [place] [address]    Special Requests   Final    BOTTLES DRAWN AEROBIC AND ANAEROBIC Blood Culture adequate volume Performed at [facility] [address] [place], [place] [address]    Culture   Final    NO GROWTH 5 DAYS Performed at [facility], [place] [address], [place], [place] [address]    Report Status 12/15/2017 FINAL  Final  MRSA PCR Screening     Status: None   Collection Time: 12/11/17  2:47 AM  Result Value Ref Range Status   MRSA by PCR NEGATIVE NEGATIVE Final    Comment:        The GeneXpert MRSA Assay (FDA approved for NASAL specimens only), is one component of a comprehensive MRSA colonization surveillance program. It is not intended to  diagnose MRSA infection nor to guide or monitor treatment for MRSA infections. Performed at [facility], [place] [address], [place],  [address]   Culture, sputum-assessment     Status: None   Collection Time: 12/11/17  6:26 AM  Result Value  Ref Range Status   Specimen Description SPUTUM  Final   Special Requests NONE  Final   Sputum evaluation   Final    THIS SPECIMEN IS ACCEPTABLE FOR SPUTUM CULTURE Performed at [facility], [place] [address] [place], [place] [address]    Report Status 12/11/2017 FINAL  Final  Culture, respiratory     Status: None   Collection Time: 12/11/17  6:26 AM  Result Value Ref Range Status   Specimen Description   Final    SPUTUM Performed at [facility] [address] [place], [place] [address]    Special Requests   Final    NONE Reflexed from [id number] Performed at [facility], [place] [address] [place], [place] [address]    Gram Stain   Final    FEW WBC PRESENT, PREDOMINANTLY PMN FEW GRAM POSITIVE COCCI IN CLUSTERS RARE GRAM NEGATIVE RODS RARE GRAM POSITIVE RODS Performed at [facility], [place] [address] [place], [place] [address]    Culture FEW CANDIDA ALBICANS  Final   Report Status 12/14/2017 FINAL  Final     Time coordinating discharge: 35 minutes  SIGNED:   Aline August, MD  [facility] 12/16/2017, 10:08 AM Pager: [phone]  If 7PM-7AM, please contact night-coverage [email or web address] Password [email or web address]

## 2017-12-16 NOTE — Telephone Encounter (Signed)
Pt still admitted.  12/16/17

## 2017-12-16 NOTE — Telephone Encounter (Signed)
Transition Care Management Follow-up Telephone Call   Date discharged? 12/16/17   How have you been since you were released from the hospital? Pt states she is ok..just came home today around lunch time. She is tired and about to get some rest. Other than that doing fine   Do you understand why you were in the hospital? YES   Do you understand the discharge instructions? YES   Where were you discharged to? Home   Items Reviewed:  Medications reviewed: YES  Allergies reviewed: YES  Dietary changes reviewed: YES, heart healhty  Referrals reviewed: YES, pt states she has appt w/cardiology next week as well. Per chart scheduled for 12/22/17   Functional Questionnaire:   Activities of Daily Living (ADLs):   She states she are independent in the following: ambulation, bathing and hygiene, feeding, continence, grooming, toileting and dressing States she doesn't require assistance    Any transportation issues/concerns?: NO   Any patient concerns? NO   Confirmed importance and date/time of follow-up visits scheduled YES, appt 12/21/17  Provider Appointment booked with Dr. [name]   Confirmed with patient if condition begins to worsen call PCP or go to the ER.  Patient was given the office number and encouraged to call back with question or concerns.  : YES

## 2017-12-16 NOTE — Progress Notes (Signed)
Patient discharged home with husband. Discharge instructions given to patient and she verbalizes understanding.

## 2017-12-16 NOTE — Care Management Note (Signed)
Case Management Note  Patient Details  Name: Corie Allis MRN: [id number] Date of Birth: [birth year]  Subjective/Objective:                  Discharged to home  Action/Plan: No cm needs present Expected Discharge Date:  12/16/17               Expected Discharge Plan:  Home/Self Care  In-House Referral:     Discharge planning Services  CM Consult  Post Acute Care Choice:    Choice offered to:     DME Arranged:    DME Agency:     HH Arranged:    [place] Agency:     Status of Service:  Completed, signed off  If discussed at [employer] of Stay Meetings, dates discussed:    Additional Comments:  Leeroy Cha, RN 12/16/2017, 9:56 AM

## 2017-12-17 ENCOUNTER — Telehealth: Payer: Self-pay | Admitting: Cardiovascular Disease

## 2017-12-17 NOTE — Telephone Encounter (Signed)
New Message:   Patient came home from the hospital yesterday 12/16/2017. She has four different medication ,  Patient would like to know could she have one beer please call patient

## 2017-12-17 NOTE — Telephone Encounter (Signed)
Please advise having alcohol with current medications?   Thank you!

## 2017-12-17 NOTE — Telephone Encounter (Signed)
Patient contacted regarding discharge from [facility] on 12/16/17.  Patient understands to follow up with provider Almyra Deforest, [place] on 12/22/17 at 11:00am at [facility] office. Patient understands discharge instructions? Yes Patient understands medications and regiment? Yes Patient understands to bring all medications to this visit? Yes

## 2017-12-17 NOTE — Telephone Encounter (Signed)
Recommend limiting alcohol as this can thin patient's blood and she also takes Eliquis. However, 1 beer every now and then should be ok.

## 2017-12-17 NOTE — Telephone Encounter (Signed)
Called and notified patient of response. No questions or concerns at this time.

## 2017-12-19 NOTE — Progress Notes (Signed)
Subjective:    Patient ID: Sharon Knight, female    DOB: 1946/01/09, 72 y.o.   MRN: [id number]  HPI The patient is here for follow up from the hospital.  Admitted 8/9-8/15/19 for lobar pneumonia.  She was sent to the ED after being seen in our office by laura.  She had an elevated WBC ocunt, AKI, AFib with RVR and lobar pneumonia.  She was diagnosed with sepsis.  She was started on antibiotics, cardizem drip in the ED.  Cardiology was consulted.  She completed the antibiotics.  She was switched to oral cardizem and started on Eliquis.  She was also diagnosed with a gout flare and was placed on prednisone.    Sepsis secondary to lobar pneumonia: Blood cultures remained negative.   Hemodynamically stable on discharge Antibiotics as below  Left lobar bacterial pneumonia: Cultures were negative Respiratory status stable upon discharge Initially started on Rocephin and Zithromax, which was switched to Ceftin of ear Completed antibiotics while in the hospital and not sent home on antibiotics She denies cough, wheeze and SOB.  She has not had any fever or chills.   Her appetite is improving.   She needs a follow up CXR in about 3 weeks - ordered  New onset atrial fibrillation with rapid ventricular rate Started on Cardizem IV Cardiology consulted Switch to oral Cardizem and metoprolol 100 mg twice daily Started on Eliquis Echo showed EF of 65-70%, no regional wall motion abnormality To follow-up with cardiology as an outpatient - has appt tomorrow She denies palpitations, chest pain, SOB and lightheadedness  Right foot pain Possible acute gout X-ray of foot unremarkable Started on prednisone and discharged home with 5 additional days Uric acid was not elevated Not placed on NSAIDs or colchicine secondary to acute kidney injury and history of nephrectomy She has two days left of the steroids She denies any pain at this point, but is still slightly swollen and a little  red.  Acute kidney injury Improved with IV fluids Creatinine normal on discharge  Hypertension Monitor blood pressure Discharge on Cardizem and metoprolol BP is low today, but denies lightheadedness.  Has some fatigue, which may be related to low BP, medication or recent PNA  Hyperlipidemia Simvastatin changed to atorvastatin because of drug-drug interaction with Cardizem    Medications and allergies reviewed with patient and updated if appropriate.  Patient Active Problem List   Diagnosis Date Noted  . Lobar pneumonia ([place]) 12/10/2017  . New onset a-fib ([place]) 12/10/2017  . AKI (acute kidney injury) ([place]) 12/10/2017  . Prediabetes 02/05/2016  . History of colonic polyps 10/22/2014  . 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 Vitamin D deficiency 10/31/2012  . ECZEMA 03/03/2010  . 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 Hyperlipidemia 08/02/2007  . Essential hypertension 08/02/2007  . Osteopenia 08/02/2007    Current Outpatient Medications on File Prior to Visit  Medication Sig Dispense Refill  . acetaminophen (TYLENOL) 325 MG tablet Take 1 tablet (325 mg total) by mouth 2 (two) times daily as needed (pain.).    Marland Kitchen apixaban (ELIQUIS) 5 MG TABS tablet Take 1 tablet (5 mg total) by mouth 2 (two) times daily. 60 tablet 0  . atorvastatin (LIPITOR) 10 MG tablet Take 1 tablet (10 mg total) by mouth daily at 6 PM. 30 tablet 0  . diltiazem (CARDIZEM CD) 240 MG 24 hr capsule Take 1 capsule (240 mg total) by mouth daily. 60 capsule 0  . Docusate Calcium (STOOL SOFTENER PO) Take  2-3 capsules by mouth See admin instructions. Every night at bedtime.    . hydrochlorothiazide (HYDRODIURIL) 25 MG tablet TAKE (1/2) TABLET DAILY. (Patient taking differently: Take 12.5 mg by mouth daily. ) 45 tablet 1  . metoprolol tartrate (LOPRESSOR) 100 MG tablet Take 1 tablet (100 mg total) by mouth 2 (two) times daily. 60 tablet 0  . Multiple Vitamin (MULTIVITAMIN) tablet Take 1 tablet by mouth  daily.    . predniSONE (DELTASONE) 20 MG tablet Take 2 tablets (40 mg total) by mouth daily for 7 days. 14 tablet 0   No current facility-administered medications on file prior to visit.     Past Medical History:  Diagnosis Date  . Arthritis   . Bilateral cataracts    Dr Kathrin Penner  . Hyperlipidemia   . Hypertension     Past Surgical History:  Procedure Laterality Date  . COLONOSCOPY     Dr Fuller Plan  . 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 OVARIAN CYST REMOVAL      Social History   Socioeconomic History  . Marital status: Married    Spouse name: Not on file  . Number of children: Not on file  . Years of education: Not on file  . Highest education level: Not on file  Occupational History  . Not on file  Social Needs  . Financial resource strain: Not on file  . Food insecurity:    Worry: Not on file    Inability: Not on file  . Transportation needs:    Medical: Not on file    Non-medical: Not on file  Tobacco Use  . Smoking status: Former Smoker    Last attempt to quit: 08/27/1984    Years since quitting: 33.3  . Smokeless tobacco: Never Used  . Tobacco comment: smoked age [age], up to 1.5-2 ppd  Substance and Sexual Activity  . Alcohol use: Yes    Alcohol/week: 3.0 standard drinks    Types: 3 Cans of beer per week    Comment: 3 x weekly  . Drug use: No  . Sexual activity: Not on file  Lifestyle  . Physical activity:    Days per week: Patient refused    Minutes per session: Patient refused  . Stress: Not on file  Relationships  . Social connections:    Talks on phone: Not on file    Gets together: Not on file    Attends religious service: Not on file    Active member of club or organization: Not on file    Attends meetings of clubs or organizations: Not on file    Relationship status: Not on file  Other Topics Concern  . Not on file  Social History Narrative   Exercise: none    Family History  Adopted: Yes  Problem Relation Age of Onset   . Colon cancer Neg Hx   . Stomach cancer Neg Hx     Review of Systems  Constitutional: Positive for fatigue. Negative for appetite change (improving), chills and fever.  Respiratory: Negative for cough, shortness of breath and wheezing.   Cardiovascular: Negative for chest pain, palpitations and leg swelling.  Gastrointestinal: Negative for abdominal pain and nausea.  Neurological: Negative for dizziness, light-headedness and headaches.       Objective:   Vitals:   12/21/17 1259  BP: 92/60  Pulse: 74  Resp: 16  Temp: 98.5 F (36.9 C)  SpO2: 97%   BP Readings from Last 3 Encounters:  12/21/17 92/60  12/16/17 (!) 138/95  12/10/17 126/74   Wt Readings from Last 3 Encounters:  12/21/17 163 lb 1.3 oz (74 kg)  12/10/17 166 lb 3.6 oz (75.4 kg)  12/10/17 163 lb 0.6 oz (74 kg)   Body mass index is 26.32 kg/m.   Physical Exam    Constitutional: Appears well-developed and well-nourished. No distress.  HENT:  Head: Normocephalic and atraumatic.  Neck: Neck supple. No tracheal deviation present. No thyromegaly present.  No cervical lymphadenopathy Cardiovascular: Normal rate, irregular rhythm and normal heart sounds.   No murmur heard. No carotid bruit .  No edema Pulmonary/Chest: Effort normal and breath sounds normal. No respiratory distress. No has no wheezes. No rales.  Msk: Right MTP  Joint - no right toe pain with palpations or movement - minimal redness, minimal swelling Skin: Skin is warm and dry. Not diaphoretic.  Psychiatric: Normal mood and affect. Behavior is normal.     Chest x-ray 12/10/2017: Left lobar lobe pneumonia.  Chest x-ray 12/12/2017: Continued left basilar infiltrate.  Recommended follow-up to resolution.   DG Foot 2 Views Right CLINICAL DATA:  Right foot pain.  EXAM: RIGHT FOOT - 2 VIEW  COMPARISON:  None.  FINDINGS: There is no evidence of fracture or dislocation. There is no evidence of arthropathy or other focal bone abnormality.  Soft tissues are unremarkable.  IMPRESSION: Normal right foot.  Electronically Signed   By: Marijo Conception, M.D.   On: 12/14/2017 13:21  Echocardiogram, 12/11/2017: Ejection fraction 65-70%.  Wall motion normal.  No valvular disease.   Lab Results  Component Value Date   WBC 13.9 (H) 12/16/2017   HGB 12.8 12/16/2017   HCT 38.7 12/16/2017   PLT 452 (H) 12/16/2017   GLUCOSE 151 (H) 12/16/2017   CHOL 169 09/09/2017   TRIG 86.0 09/09/2017   HDL 60.30 09/09/2017   LDLCALC 91 09/09/2017   ALT 10 12/10/2017   AST 19 12/10/2017   NA 142 12/16/2017   K 3.9 12/16/2017   CL 110 12/16/2017   CREATININE 0.88 12/16/2017   BUN 23 12/16/2017   CO2 24 12/16/2017   TSH 3.100 12/11/2017   HGBA1C 6.0 09/09/2017   MICROALBUR 0.2 11/01/2007    Assessment & Plan:    See Problem List for Assessment and Plan of chronic medical problems.

## 2017-12-21 ENCOUNTER — Encounter: Payer: Self-pay | Admitting: Internal Medicine

## 2017-12-21 ENCOUNTER — Ambulatory Visit: Payer: Medicare Other | Admitting: Internal Medicine

## 2017-12-21 VITALS — BP 92/60 | HR 74 | Temp 98.5°F | Resp 16 | Ht 66.0 in | Wt 163.1 lb

## 2017-12-21 DIAGNOSIS — M109 Gout, unspecified: Secondary | ICD-10-CM

## 2017-12-21 DIAGNOSIS — J181 Lobar pneumonia, unspecified organism: Secondary | ICD-10-CM | POA: Diagnosis not present

## 2017-12-21 DIAGNOSIS — N179 Acute kidney failure, unspecified: Secondary | ICD-10-CM | POA: Diagnosis not present

## 2017-12-21 DIAGNOSIS — E782 Mixed hyperlipidemia: Secondary | ICD-10-CM

## 2017-12-21 DIAGNOSIS — I10 Essential (primary) hypertension: Secondary | ICD-10-CM | POA: Diagnosis not present

## 2017-12-21 DIAGNOSIS — I4891 Unspecified atrial fibrillation: Secondary | ICD-10-CM

## 2017-12-21 DIAGNOSIS — Z8739 Personal history of other diseases of the musculoskeletal system and connective tissue: Secondary | ICD-10-CM | POA: Insufficient documentation

## 2017-12-21 MED ORDER — HYDROCHLOROTHIAZIDE 25 MG PO TABS: ORAL_TABLET | ORAL | 1 refills | Status: DC

## 2017-12-21 NOTE — Assessment & Plan Note (Addendum)
BP on the low side She is asymptomatic Will continue current medication for now -- sees cardiology tomorrow Advised her to monitor BP at home

## 2017-12-21 NOTE — Patient Instructions (Addendum)
Have a chest xray in three weeks to confirm the pneumonia has completely resolved.  This has been ordered and you can go directly to the xray department.      Medications reviewed and updated.  No changes recommended at this time.    Please followup in November as scheduled

## 2017-12-21 NOTE — Assessment & Plan Note (Signed)
HR irregular here today Asymptomatic Sees cardio tomorrow Continue current medications

## 2017-12-21 NOTE — Assessment & Plan Note (Addendum)
Completed antibiotics in the hospital No cough, wheeze, sob, or fever Appetite improving Has some fatigue but that may be multifactorial and should improve Repeat CXR in 3 weeks

## 2017-12-21 NOTE — Assessment & Plan Note (Signed)
R first MTP Improving with steroids - 2 days left - will complete Uric acid level  Normal in the hosp

## 2017-12-21 NOTE — Assessment & Plan Note (Signed)
Resolved with IVF in the hospital No repeat blood work needed today

## 2017-12-21 NOTE — Assessment & Plan Note (Signed)
lipitor started - simvastatin d/c'd due to starting cardizem Continue lipitor

## 2017-12-22 ENCOUNTER — Encounter: Payer: Self-pay | Admitting: Physician Assistant

## 2017-12-22 ENCOUNTER — Ambulatory Visit: Payer: Medicare Other | Admitting: Physician Assistant

## 2017-12-22 VITALS — BP 120/70 | HR 49 | Ht 66.0 in | Wt 163.2 lb

## 2017-12-22 DIAGNOSIS — Z905 Acquired absence of kidney: Secondary | ICD-10-CM | POA: Diagnosis not present

## 2017-12-22 DIAGNOSIS — E785 Hyperlipidemia, unspecified: Secondary | ICD-10-CM

## 2017-12-22 DIAGNOSIS — I10 Essential (primary) hypertension: Secondary | ICD-10-CM | POA: Diagnosis not present

## 2017-12-22 DIAGNOSIS — I4891 Unspecified atrial fibrillation: Secondary | ICD-10-CM | POA: Diagnosis not present

## 2017-12-22 LAB — CBC WITH DIFFERENTIAL/PLATELET
BASOS ABS: 0 10*3/uL (ref 0.0–0.2)
Basos: 0 %
EOS (ABSOLUTE): 0 10*3/uL (ref 0.0–0.4)
Eos: 0 %
Hematocrit: 44.5 % (ref 34.0–46.6)
Hemoglobin: 14.5 g/dL (ref 11.1–15.9)
Immature Grans (Abs): 0.2 10*3/uL — ABNORMAL HIGH (ref 0.0–0.1)
Immature Granulocytes: 1 %
LYMPHS ABS: 1.3 10*3/uL (ref 0.7–3.1)
Lymphs: 7 %
MCH: 30.5 pg (ref 26.6–33.0)
MCHC: 32.6 g/dL (ref 31.5–35.7)
MCV: 94 fL (ref 79–97)
Monocytes Absolute: 0.2 10*3/uL (ref 0.1–0.9)
Monocytes: 1 %
NEUTROS ABS: 15.8 10*3/uL — AB (ref 1.4–7.0)
Neutrophils: 91 %
PLATELETS: 575 10*3/uL — AB (ref 150–450)
RBC: 4.75 x10E6/uL (ref 3.77–5.28)
RDW: 12.9 % (ref 12.3–15.4)
WBC: 17.5 10*3/uL — ABNORMAL HIGH (ref 3.4–10.8)

## 2017-12-22 MED ORDER — DILTIAZEM HCL ER COATED BEADS 180 MG PO CP24: 180.0000 mg | ORAL_CAPSULE | Freq: Every day | ORAL | 5 refills | Status: DC

## 2017-12-22 NOTE — Patient Instructions (Signed)
Medication Instructions:  Your physician has recommended you make the following change in your medication:  REDUCE Diltiazem to 180mg  daily. An Rx has been sent to your pharmacy   Labwork: CBC today  Testing/Procedures: Your physician has recommended that you wear an event monitor. Event monitors are medical devices that record the heart's electrical activity. Doctors most often [place] these monitors to diagnose arrhythmias. Arrhythmias are problems with the speed or rhythm of the heartbeat. The monitor is a small, portable device. You can wear one while you do your normal daily activities. This is usually used to diagnose what is causing palpitations/syncope (passing out).    Follow-Up: Your physician recommends that you schedule a follow-up appointment in: 2 months with Dr.[place]   Any Other Special Instructions Will Be Listed Below (If Applicable).     If you need a refill on your cardiac medications before your next appointment, please call your pharmacy.

## 2017-12-22 NOTE — Progress Notes (Signed)
Cardiology Office Note    Date:  12/22/2017   ID:  Sharon Knight, DOB [birth year], MRN [id number]  PCP:  Binnie Rail, MD  Cardiologist:  Dr. Oval Linsey  Chief Complaint  Patient presents with  . Follow-up    seen for Dr. Oval Linsey. New afib.    History of Present Illness:  Sharon Knight is a 72 y.o. female with past medical history of right nephrectomy, hypertension and hyperlipidemia who recently went to the hospital for elevated white blood cell count.  She was found to have fever, lobar pneumonia, acute kidney injury and was in rapid atrial fibrillation.  She was treated for sepsis secondary to pneumonia.  Leukocytosis also improved afterward as well.  Cardiology was consulted for new onset of atrial fibrillation with RVR which was felt to be secondary to the sepsis.  She was treated with IV diltiazem.  She was eventually transitioned to higher dose of home metoprolol.  The plan is to continue rate control and anticoagulation with Eliquis for 3 weeks before outpatient cardioversion.  Echocardiogram obtained on 12/11/2017 showed EF 65 to 70%, moderate LVH, normal left atrial size.  Patient presents today for cardiology office visit.  She denies any recurrent palpitations since discharge.  EKG showed she has converted back to sinus rhythm.  Otherwise she denies any shortness of breath, lower extremity edema, orthopnea or PND.  According to the patient, she is due for repeat chest x-ray at her primary care provider's office.  Overall she is feeling very well.  Given the fact that her atrial fibrillation could have been triggered by pneumonia, I recommended a 30-day event monitor to check for recurrence of atrial fibrillation.  If there is no recurrence of atrial fibrillation, potentially consider discontinuing Eliquis.  Her heart rate on today's EKG was 49, she denies any dizziness, blurred vision or feeling of passing out.  I will decrease her diltiazem to 180 mg daily.  Her right foot pain has  improved after taking a course of steroid for presumed gout, she has 1 more dose left.   Past Medical History:  Diagnosis Date  . Arthritis   . Bilateral cataracts    Dr Kathrin Penner  . Hyperlipidemia   . Hypertension     Past Surgical History:  Procedure Laterality Date  . COLONOSCOPY     Dr Fuller Plan  . 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 OVARIAN CYST REMOVAL      Current Medications: Outpatient Medications Prior to Visit  Medication Sig Dispense Refill  . acetaminophen (TYLENOL) 325 MG tablet Take 1 tablet (325 mg total) by mouth 2 (two) times daily as needed (pain.).    Marland Kitchen apixaban (ELIQUIS) 5 MG TABS tablet Take 1 tablet (5 mg total) by mouth 2 (two) times daily. 60 tablet 0  . atorvastatin (LIPITOR) 10 MG tablet Take 1 tablet (10 mg total) by mouth daily at 6 PM. 30 tablet 0  . hydrochlorothiazide (HYDRODIURIL) 25 MG tablet TAKE (1/2) TABLET DAILY. 45 tablet 1  . metoprolol tartrate (LOPRESSOR) 100 MG tablet Take 1 tablet (100 mg total) by mouth 2 (two) times daily. 60 tablet 0  . Multiple Vitamin (MULTIVITAMIN) tablet Take 1 tablet by mouth daily.    . predniSONE (DELTASONE) 20 MG tablet Take 2 tablets (40 mg total) by mouth daily for 7 days. 14 tablet 0  . diltiazem (CARDIZEM CD) 240 MG 24 hr capsule Take 1 capsule (240 mg total) by mouth daily. 60 capsule 0  .  Docusate Calcium (STOOL SOFTENER PO) Take 2-3 capsules by mouth See admin instructions. Every night at bedtime.     No facility-administered medications prior to visit.      Allergies:   Ibuprofen   Social History   Socioeconomic History  . Marital status: Married    Spouse name: Not on file  . Number of children: Not on file  . Years of education: Not on file  . Highest education level: Not on file  Occupational History  . Not on file  Social Needs  . Financial resource strain: Not on file  . Food insecurity:    Worry: Not on file    Inability: Not on file  . Transportation needs:    Medical:  Not on file    Non-medical: Not on file  Tobacco Use  . Smoking status: Former Smoker    Last attempt to quit: 08/27/1984    Years since quitting: 33.3  . Smokeless tobacco: Never Used  . Tobacco comment: smoked age [age], up to 1.5-2 ppd  Substance and Sexual Activity  . Alcohol use: Yes    Alcohol/week: 3.0 standard drinks    Types: 3 Cans of beer per week    Comment: 3 x weekly  . Drug use: No  . Sexual activity: Not on file  Lifestyle  . Physical activity:    Days per week: Patient refused    Minutes per session: Patient refused  . Stress: Not on file  Relationships  . Social connections:    Talks on phone: Not on file    Gets together: Not on file    Attends religious service: Not on file    Active member of club or organization: Not on file    Attends meetings of clubs or organizations: Not on file    Relationship status: Not on file  Other Topics Concern  . Not on file  Social History Narrative   Exercise: none     Family History:  The patient's family history is not on file. She was adopted.   ROS:   Please see the history of present illness.    ROS All other systems reviewed and are negative.   PHYSICAL EXAM:   VS:  BP 120/70   Pulse (!) 49   Ht 5\' 6"  (1.676 m)   Wt 163 lb 3.2 oz (74 kg)   BMI 26.34 kg/m    GEN: Well nourished, well developed, in no acute distress  HEENT: normal  Neck: no JVD, carotid bruits, or masses Cardiac: RRR; no murmurs, rubs, or gallops,no edema  Respiratory:  clear to auscultation bilaterally, normal work of breathing GI: soft, nontender, nondistended, + BS MS: no deformity or atrophy  Skin: warm and dry, no rash Neuro:  Alert and Oriented x 3, Strength and sensation are intact Psych: euthymic mood, full affect  Wt Readings from Last 3 Encounters:  12/22/17 163 lb 3.2 oz (74 kg)  12/21/17 163 lb 1.3 oz (74 kg)  12/10/17 166 lb 3.6 oz (75.4 kg)      Studies/Labs Reviewed:   EKG:  EKG is ordered today.  The ekg  ordered today demonstrates sinus bradycardia, heart rate 49.  Recent Labs: 12/10/2017: ALT 10 12/11/2017: TSH 3.100 12/16/2017: BUN 23; Creatinine, Ser 0.88; Hemoglobin 12.8; Magnesium 2.2; Platelets 452; Potassium 3.9; Sodium 142   Lipid Panel    Component Value Date/Time   CHOL 169 09/09/2017 0943   CHOL 174 12/05/2012 1538   TRIG 86.0 09/09/2017 0943  TRIG 109 12/05/2012 1538   HDL 60.30 09/09/2017 0943   HDL 60 12/05/2012 1538   CHOLHDL 3 09/09/2017 0943   VLDL 17.2 09/09/2017 0943   LDLCALC 91 09/09/2017 0943   LDLCALC 92 12/05/2012 1538    Additional studies/ records that were reviewed today include:   Echo 12/11/2017 LV EF: 65% -   70% Study Conclusions  - Left ventricle: The cavity size was normal. Wall thickness was   increased in a pattern of moderate LVH. Systolic function was   vigorous. The estimated ejection fraction was in the range of 65%   to 70%. Wall motion was normal; there were no regional wall   motion abnormalities. The study was not technically sufficient to   allow evaluation of LV diastolic dysfunction due to atrial   fibrillation. - Aortic valve: Valve area (VTI): 2.14 cm^2. Valve area (Vmax):   2.43 cm^2. Valve area (Vmean): 2.33 cm^2. - Mitral valve: Valve area by continuity equation (using LVOT   flow): 1.61 cm^2. - Atrial septum: No defect or patent foramen ovale was identified.    ASSESSMENT:    1. Atrial fibrillation, unspecified type ([place])   2. H/O right nephrectomy   3. Essential hypertension   4. Hyperlipidemia, unspecified hyperlipidemia type      PLAN:  In order of problems listed above:  1. Paroxysmal atrial fibrillation: Occurred in the setting of pneumonia.  She was discharged on rate control strategy with plan for outpatient DC cardioversion after taking Eliquis for 3 weeks.  However she has self converted during this period on rate control therapy.  Heart rate is bradycardic today however asymptomatic.  Will decrease  diltiazem CD to 180 mg daily.  I recommended a 30-day event monitor to check for recurrence of atrial fibrillation, if no recurrence, may consider stopping the Eliquis and switch to aspirin instead.  2. Hypertension: Blood pressure stable on current medication, decreasing diltiazem to 180 mg daily for bradycardia  3. Hyperlipidemia: On Lipitor 10 mg daily.  Recent lipid panel showed very well-controlled cholesterol  4. History of right nephrectomy: Renal function stable    Medication Adjustments/Labs and Tests Ordered: Current medicines are reviewed at length with the patient today.  Concerns regarding medicines are outlined above.  Medication changes, Labs and Tests ordered today are listed in the Patient Instructions below. Patient Instructions  Medication Instructions:  Your physician has recommended you make the following change in your medication:  REDUCE Diltiazem to 180mg  daily. An Rx has been sent to your pharmacy   Labwork: CBC today  Testing/Procedures: Your physician has recommended that you wear an event monitor. Event monitors are medical devices that record the heart's electrical activity. Doctors most often [place] these monitors to diagnose arrhythmias. Arrhythmias are problems with the speed or rhythm of the heartbeat. The monitor is a small, portable device. You can wear one while you do your normal daily activities. This is usually used to diagnose what is causing palpitations/syncope (passing out).    Follow-Up: Your physician recommends that you schedule a follow-up appointment in: 2 months with Dr.[place]   Any Other Special Instructions Will Be Listed Below (If Applicable).     If you need a refill on your cardiac medications before your next appointment, please call your pharmacy.      Hilbert Corrigan, [place]  12/22/2017 11:37 AM    [address], [place], [place]  [address] Phone: [phone]; Fax: [phone]

## 2017-12-23 ENCOUNTER — Ambulatory Visit (INDEPENDENT_AMBULATORY_CARE_PROVIDER_SITE_OTHER): Payer: Medicare Other

## 2017-12-23 ENCOUNTER — Telehealth: Payer: Self-pay

## 2017-12-23 ENCOUNTER — Telehealth: Payer: Self-pay | Admitting: Internal Medicine

## 2017-12-23 DIAGNOSIS — I4891 Unspecified atrial fibrillation: Secondary | ICD-10-CM | POA: Diagnosis not present

## 2017-12-23 NOTE — Telephone Encounter (Signed)
-----   Message from [place], [place] sent at 12/22/2017  4:48 PM EDT ----- Red blood cell count stable on the eliquis, but white blood cell increased again, this is expected with the recent course of prednisone.

## 2017-12-23 NOTE — Telephone Encounter (Signed)
Pt aware of lab results with verbal understanding. Notes recorded by Almyra Deforest, PA on 12/22/2017 at 4:48 PM EDT Red blood cell count stable on the eliquis, but white blood cell increased again, this is expected with the recent course of prednisone

## 2017-12-23 NOTE — Telephone Encounter (Signed)
Cardiology Moon[place] Note  Returned page from [employer]. Patient wearing rhythm monitor. Was found to have 1 minute of atrial flutter this afternoon. HR's 110's at the time. Baseline rhythm sinus brady in 40's. Unclear if patient was symptomatic at the time. No other events recorded. Will cc Dr. Oval Linsey. \  Marcie Mowers, MD Cardiology Fellow, PGY-6

## 2017-12-27 ENCOUNTER — Telehealth: Payer: Self-pay | Admitting: Cardiology

## 2017-12-27 ENCOUNTER — Telehealth: Payer: Self-pay | Admitting: *Deleted

## 2017-12-27 DIAGNOSIS — I455 Other specified heart block: Secondary | ICD-10-CM

## 2017-12-27 DIAGNOSIS — I4891 Unspecified atrial fibrillation: Secondary | ICD-10-CM

## 2017-12-27 MED ORDER — METOPROLOL TARTRATE 25 MG PO TABS: 25.0000 mg | ORAL_TABLET | Freq: Two times a day (BID) | ORAL | 5 refills | Status: DC

## 2017-12-27 NOTE — Telephone Encounter (Signed)
Dr Oval Linsey reviewed [employer] strips, will decrease Metoprolol to 25 mg twice a day and refer to EP.

## 2017-12-27 NOTE — Telephone Encounter (Signed)
Received reading from [employer] regarding regarding reading of 4.9 sec pause at 10:14 am. This was an auto trigger. When I spoke with patient around 1:45 patient had not had any symptoms. Reviewed with Dr Stanford Breed and continue with current plan to decrease Metoprolol from 100 mg twice a day to 25 mg twice a day along with EP referral.

## 2017-12-27 NOTE — Telephone Encounter (Addendum)
Received call from [employer] with critical EKG  States approximately 30 mins ago patient had 5 second pause, aflutter variable.    Attempt to call patient, she is at grocery store but will call when she returns.   Husband states she has been feeling fine, no current issues other than fatigue.   EKG to be faxed over, not available online yet.     Per chart review:  Notification this morning 7 AM with 4.6 sec pause-EKG available in [employer].  Will review with Dr. Oval Linsey.   Spoke to patient-patient states she is feeling okay, denies symptoms.

## 2017-12-27 NOTE — Telephone Encounter (Signed)
Pt with a fib and 4.6 sec pause to SR then to a fib again.  Will notify Dr. Oval Linsey  I attempted to call pt but no answer.

## 2017-12-28 NOTE — Telephone Encounter (Signed)
Patient has appointment with Dr Curt Bears 12/30/17

## 2017-12-29 ENCOUNTER — Encounter: Payer: Self-pay | Admitting: Cardiology

## 2017-12-29 NOTE — Telephone Encounter (Signed)
Dr Oval Linsey decreased Metoprolol and patient has appointment with Dr Curt Bears 12/30/17, see 12/27/17 phone note

## 2017-12-30 ENCOUNTER — Other Ambulatory Visit: Payer: Self-pay | Admitting: Obstetrics and Gynecology

## 2017-12-30 ENCOUNTER — Encounter: Payer: Self-pay | Admitting: Cardiology

## 2017-12-30 ENCOUNTER — Ambulatory Visit: Payer: Medicare Other | Admitting: Cardiology

## 2017-12-30 VITALS — BP 104/70 | HR 108 | Ht 66.0 in | Wt 164.0 lb

## 2017-12-30 DIAGNOSIS — I480 Paroxysmal atrial fibrillation: Secondary | ICD-10-CM | POA: Diagnosis not present

## 2017-12-30 DIAGNOSIS — I10 Essential (primary) hypertension: Secondary | ICD-10-CM | POA: Diagnosis not present

## 2017-12-30 DIAGNOSIS — E785 Hyperlipidemia, unspecified: Secondary | ICD-10-CM

## 2017-12-30 DIAGNOSIS — Z1231 Encounter for screening mammogram for malignant neoplasm of breast: Secondary | ICD-10-CM

## 2017-12-30 MED ORDER — FLECAINIDE ACETATE 100 MG PO TABS: 100.0000 mg | ORAL_TABLET | Freq: Two times a day (BID) | ORAL | 3 refills | Status: DC

## 2017-12-30 NOTE — Patient Instructions (Addendum)
Medication Instructions:  Your physician has recommended you make the following change in your medication:  1. STOP Metoprolol 2. START Flecainide 100 mg twice daily -- YOU WILL START THIS MEDICATION 7-10 DAYS PRIOR TO STRESS TESTING  * If you need a refill on your cardiac medications before your next appointment, please call your pharmacy.   Labwork: None ordered  Testing/Procedures: Your physician has requested that you have an exercise tolerance test - YOU WILL HAVE THIS PERFORMED 7-10 DAYS AFTER STARTING FLECAINIDE. For further information please visit [email or web address]. Please also follow instruction sheet, as given.  Follow-Up: Your physician recommends that you schedule a follow-up appointment in: 3 months with Dr. Curt Bears.   Thank you for choosing [facility]!!   Trinidad Curet, RN [phone]  Any Other Special Instructions Will Be Listed Below (If Applicable).  Flecainide tablets What is this medicine? FLECAINIDE (FLEK a nide) is an antiarrhythmic drug. This medicine is used to prevent irregular heart rhythm. It can also slow down fast heartbeats called tachycardia. This medicine may be used for other purposes; ask your health care provider or pharmacist if you have questions. COMMON BRAND NAME(S): Tambocor What should I tell my health care provider before I take this medicine? They need to know if you have any of these conditions: -abnormal levels of potassium in the blood -heart disease including heart rhythm and heart rate problems -kidney or liver disease -recent heart attack -an unusual or allergic reaction to flecainide, local anesthetics, other medicines, foods, dyes, or preservatives -pregnant or trying to get pregnant -breast-feeding How should I use this medicine? Take this medicine by mouth with a glass of water. Follow the directions on the prescription label. You can take this medicine with or without food. Take your doses at regular intervals. Do  not take your medicine more often than directed. Do not stop taking this medicine suddenly. This may cause serious, heart-related side effects. If your doctor wants you to stop the medicine, the dose may be slowly lowered over time to avoid any side effects. Talk to your pediatrician regarding the use of this medicine in children. While this drug may be prescribed for children as young as 1 year of age for selected conditions, precautions do apply. Overdosage: If you think you have taken too much of this medicine contact a poison control center or emergency room at once. NOTE: This medicine is only for you. Do not share this medicine with others. What if I miss a dose? If you miss a dose, take it as soon as you can. If it is almost time for your next dose, take only that dose. Do not take double or extra doses. What may interact with this medicine? Do not take this medicine with any of the following medications: -amoxapine -arsenic trioxide -certain antibiotics like clarithromycin, erythromycin, gatifloxacin, gemifloxacin, levofloxacin, moxifloxacin, sparfloxacin, or troleandomycin -certain antidepressants called tricyclic antidepressants like amitriptyline, imipramine, or nortriptyline -certain medicines to control heart rhythm like disopyramide, dofetilide, encainide, moricizine, procainamide, propafenone, and quinidine -cisapride -cyclobenzaprine -delavirdine -droperidol -haloperidol -hawthorn -imatinib -levomethadyl -maprotiline -medicines for malaria like chloroquine and halofantrine -pentamidine -phenothiazines like chlorpromazine, mesoridazine, prochlorperazine, thioridazine -pimozide -quinine -ranolazine -ritonavir -sertindole -ziprasidone This medicine may also interact with the following medications: -cimetidine -medicines for angina or high blood pressure -medicines to control heart rhythm like amiodarone and digoxin This list may not describe all possible interactions.  Give your health care provider a list of all the medicines, herbs, non-prescription drugs, or dietary supplements you use. Also tell  them if you smoke, drink alcohol, or use illegal drugs. Some items may interact with your medicine. What should I watch for while using this medicine? Visit your doctor or health care professional for regular checks on your progress. Because your condition and the use of this medicine carries some risk, it is a good idea to carry an identification card, necklace or bracelet with details of your condition, medications and doctor or health care professional. Check your blood pressure and pulse rate regularly. Ask your health care professional what your blood pressure and pulse rate should be, and when you should contact him or her. Your doctor or health care professional also may schedule regular blood tests and electrocardiograms to check your progress. You may get drowsy or dizzy. Do not drive, use machinery, or do anything that needs mental alertness until you know how this medicine affects you. Do not stand or sit up quickly, especially if you are an older patient. This reduces the risk of dizzy or fainting spells. Alcohol can make you more dizzy, increase flushing and rapid heartbeats. Avoid alcoholic drinks. What side effects may I notice from receiving this medicine? Side effects that you should report to your doctor or health care professional as soon as possible: -chest pain, continued irregular heartbeats -difficulty breathing -swelling of the legs or feet -trembling, shaking -unusually weak or tired Side effects that usually do not require medical attention (report to your doctor or health care professional if they continue or are bothersome): -blurred vision -constipation -headache -nausea, vomiting -stomach pain This list may not describe all possible side effects. Call your doctor for medical advice about side effects. You may report side effects to FDA at  1-800-FDA-1088. Where should I keep my medicine? Keep out of the reach of children. Store at room temperature between 15 and 30 degrees C (59 and 86 degrees F). Protect from light. Keep container tightly closed. Throw away any unused medicine after the expiration date. NOTE: This sheet is a summary. It may not cover all possible information. If you have questions about this medicine, talk to your doctor, pharmacist, or health care provider.  2018 Elsevier/Gold Standard (2007-08-24 16:46:09)

## 2017-12-30 NOTE — Progress Notes (Signed)
Electrophysiology Office Note   Date:  12/30/2017   ID:  Sharon Knight, DOB 1946-05-03, MRN [id number]  PCP:  Binnie Rail, MD  Cardiologist:  Oval Linsey Primary Electrophysiologist:  Will Meredith Leeds, MD    No chief complaint on file.    History of Present Illness: Sharon Knight is a 72 y.o. female who is being seen today for the evaluation of atrial fibrillation at the request of Almyra Deforest. Presenting today for electrophysiology evaluation.  He has a history of right-sided nephrectomy, hypertension, and hyperlipidemia.  She was recently hospitalized for elevated white count and fever and was found to have a lobar pneumonia.  This is complicated by acute kidney injury and atrial fibrillation.  It was thought that her atrial fibrillation was a due to sepsis.  She was put on Eliquis for 3 weeks prior to outpatient cardioversion.  She did convert to sinus rhythm on her own.  Her main symptoms are weakness and fatigue.  She thinks that this may be due to being in the hospital.  She was discharged on August 15.  She has been weak and fatigued intermittently since then.  Today, she denies symptoms of palpitations, chest pain, shortness of breath, orthopnea, PND, lower extremity edema, claudication, dizziness, presyncope, syncope, bleeding, or neurologic sequela. The patient is tolerating medications without difficulties.    Past Medical History:  Diagnosis Date  . Arthritis   . Bilateral cataracts    Dr Kathrin Penner  . Hyperlipidemia   . Hypertension    Past Surgical History:  Procedure Laterality Date  . COLONOSCOPY     Dr Fuller Plan  . 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 OVARIAN CYST REMOVAL       Current Outpatient Medications  Medication Sig Dispense Refill  . acetaminophen (TYLENOL) 325 MG tablet Take 1 tablet (325 mg total) by mouth 2 (two) times daily as needed (pain.).    Marland Kitchen apixaban (ELIQUIS) 5 MG TABS tablet Take 1 tablet (5 mg total) by mouth 2 (two) times daily.  60 tablet 0  . atorvastatin (LIPITOR) 10 MG tablet Take 1 tablet (10 mg total) by mouth daily at 6 PM. 30 tablet 0  . diltiazem (CARDIZEM CD) 180 MG 24 hr capsule Take 1 capsule (180 mg total) by mouth daily. 30 capsule 5  . hydrochlorothiazide (HYDRODIURIL) 25 MG tablet TAKE (1/2) TABLET DAILY. 45 tablet 1  . metoprolol tartrate (LOPRESSOR) 25 MG tablet Take 1 tablet (25 mg total) by mouth 2 (two) times daily. 60 tablet 5  . Multiple Vitamin (MULTIVITAMIN) tablet Take 1 tablet by mouth daily.    . flecainide (TAMBOCOR) 100 MG tablet Take 1 tablet (100 mg total) by mouth 2 (two) times daily. 60 tablet 3   No current facility-administered medications for this visit.     Allergies:   Ibuprofen   Social History:  The patient  reports that she quit smoking about 33 years ago. She has never used smokeless tobacco. She reports that she drinks about 3.0 standard drinks of alcohol per week. She reports that she does not use drugs.   Family History:  The patient's family history is not on file. She was adopted.    ROS:  Please see the history of present illness.   Otherwise, review of systems is positive for none.   All other systems are reviewed and negative.    PHYSICAL EXAM: VS:  BP 104/70   Pulse (!) 108   Ht 5\' 6"  (1.676  m)   Wt 164 lb (74.4 kg)   SpO2 98%   BMI 26.47 kg/m  , BMI Body mass index is 26.47 kg/m. GEN: Well nourished, well developed, in no acute distress  HEENT: normal  Neck: no JVD, carotid bruits, or masses Cardiac: iRRR; no murmurs, rubs, or gallops,no edema  Respiratory:  clear to auscultation bilaterally, normal work of breathing GI: soft, nontender, nondistended, + BS MS: no deformity or atrophy  Skin: warm and dry Neuro:  Strength and sensation are intact Psych: euthymic mood, full affect  EKG:  EKG is not ordered today. Personal review of the ekg ordered 12/22/17 shows sinus rhythm, rate 49  Recent Labs: 12/10/2017: ALT 10 12/11/2017: TSH 3.100 12/16/2017:  BUN 23; Creatinine, Ser 0.88; Magnesium 2.2; Potassium 3.9; Sodium 142 12/22/2017: Hemoglobin 14.5; Platelets 575    Lipid Panel     Component Value Date/Time   CHOL 169 09/09/2017 0943   CHOL 174 12/05/2012 1538   TRIG 86.0 09/09/2017 0943   TRIG 109 12/05/2012 1538   HDL 60.30 09/09/2017 0943   HDL 60 12/05/2012 1538   CHOLHDL 3 09/09/2017 0943   VLDL 17.2 09/09/2017 0943   LDLCALC 91 09/09/2017 0943   LDLCALC 92 12/05/2012 1538     Wt Readings from Last 3 Encounters:  12/30/17 164 lb (74.4 kg)  12/22/17 163 lb 3.2 oz (74 kg)  12/21/17 163 lb 1.3 oz (74 kg)      Other studies Reviewed: Additional studies/ records that were reviewed today include: TTE 12/11/17  Review of the above records today demonstrates:  - Left ventricle: The cavity size was normal. Wall thickness was   increased in a pattern of moderate LVH. Systolic function was   vigorous. The estimated ejection fraction was in the range of 65%   to 70%. Wall motion was normal; there were no regional wall   motion abnormalities. The study was not technically sufficient to   allow evaluation of LV diastolic dysfunction due to atrial   fibrillation. - Aortic valve: Valve area (VTI): 2.14 cm^2. Valve area (Vmax):   2.43 cm^2. Valve area (Vmean): 2.33 cm^2. - Mitral valve: Valve area by continuity equation (using LVOT   flow): 1.61 cm^2. - Atrial septum: No defect or patent foramen ovale was identified.   ASSESSMENT AND PLAN:  1.  Paroxysmal atrial fibrillation: She appears to be in atrial fibrillation today.  She wore a cardiac monitor that showed that she was in and out of atrial fibrillation quite a bit.  She has had pauses on her monitor.  She is on quite a bit of rate control with both metoprolol and diltiazem.  Hopefully if we can keep her in sinus rhythm that will help out with a lot of her fatigue and weakness.  We will start her on flecainide 100 mg today.  We will stop her metoprolol as she is well rate  controlled when she is in sinus rhythm.  Hopefully the flecainide will keep her in rhythm and we will be able to avoid pacemaker implant.  I have told her to continue to wear her cardiac monitor for a further evaluation of her rhythm to see if she continues to have pauses.  She was recently treated for pneumonia.  Would prefer to wait on pacemaker implant for another few weeks.  2.  Hypertension: Well-controlled.  No changes.  3.  Hyperlipidemia: Continue Lipitor  Case discussed with referring  Current medicines are reviewed at length with the patient today.  The patient does not have concerns regarding her medicines.  The following changes were made today:  none  Labs/ tests ordered today include:  No orders of the defined types were placed in this encounter.    Disposition:   FU with Will Camnitz 3 months  Signed, Will Meredith Leeds, MD  12/30/2017 4:47 PM     [place] [address] [place] [address] [phone] (office) [phone] (fax)

## 2017-12-30 NOTE — Addendum Note (Signed)
Addended by: Stanton Kidney on: 12/30/2017 04:51 PM   Modules accepted: Orders

## 2017-12-31 ENCOUNTER — Telehealth: Payer: Self-pay | Admitting: Cardiology

## 2017-12-31 ENCOUNTER — Telehealth: Payer: Self-pay | Admitting: *Deleted

## 2017-12-31 DIAGNOSIS — I480 Paroxysmal atrial fibrillation: Secondary | ICD-10-CM

## 2017-12-31 MED ORDER — METOPROLOL TARTRATE 25 MG PO TABS: 12.5000 mg | ORAL_TABLET | Freq: Two times a day (BID) | ORAL | 3 refills | Status: DC

## 2017-12-31 NOTE — Telephone Encounter (Signed)
Pt is ready to scheduled GXT & Flecainide start.  She is aware office will call her today to arrange GXT.  She was reminded not to start Flecainide until I instruct her when to.  She is agreeable to plan.

## 2017-12-31 NOTE — Telephone Encounter (Signed)
GXT scheduled for 9/9 Pt will start Flecainide 8/31 Follow up w/ Camnitz scheduled for 11/20  Advised to call office if she experiences any issues after starting new medication. Pt is agreeable to plan.

## 2017-12-31 NOTE — Telephone Encounter (Signed)
Received another transmission from [place] with conduction w/couplet PVC's (3 in 1 min) on 12/30/17 @5 :49pm. Plan already in place from the same day report from that morning. Patient has upcoming appointment on 01/05/18.

## 2017-12-31 NOTE — Telephone Encounter (Signed)
Spoke to patient. Office received  EKG FROM  [facility].  PATIENT HAD ATRIAL FLUTTER W/ RUN V-TACH  PER TRANSMISSION AT 12/30/17 @9 :32. PATIENT HAD APPOINTMENT WITH DR Curt Bears 12/30/17.   REPORT REVIEWED BY D.O.D.( CROITORU).PER ORDER   RESTART METOPROLOL TARTRATE 25 MG NOW ,  THEN TAKE 12.5 MG TWICE A DAY.  F/U WITH APPOINTMENT DR [facility] OR [facility].  PATIENT VERBALIZED  UNDERSTANDINGS INSTRUCTION CONCERNING MEDICATION.  APPT WITH DR [facility] SCHEDULE FOR 01/05/18.     PLACED MEDICATION BACK ON MED.LIST ( DID NOT SEND FOR REFILL , PATIENT HAS MEDICATION.)  STRIPS PLACED IN MAIL BOX  AT OFFICE FOR REVIEW.

## 2017-12-31 NOTE — Telephone Encounter (Signed)
New Message:      Pt is calling to set up a stress test that was discussed on yesterday.

## 2018-01-05 ENCOUNTER — Ambulatory Visit: Payer: Medicare Other | Admitting: Cardiovascular Disease

## 2018-01-06 ENCOUNTER — Ambulatory Visit (HOSPITAL_COMMUNITY): Payer: Medicare Other | Admitting: Nurse Practitioner

## 2018-01-06 ENCOUNTER — Emergency Department (HOSPITAL_COMMUNITY)
Admission: EM | Admit: 2018-01-06 | Discharge: 2018-01-06 | Disposition: A | Discharge status: Home / Self Care | Payer: Medicare Other | Attending: Emergency Medicine | Admitting: Emergency Medicine

## 2018-01-06 ENCOUNTER — Telehealth: Payer: Self-pay | Admitting: *Deleted

## 2018-01-06 ENCOUNTER — Other Ambulatory Visit: Payer: Self-pay

## 2018-01-06 ENCOUNTER — Encounter (HOSPITAL_COMMUNITY): Payer: Self-pay | Admitting: Emergency Medicine

## 2018-01-06 DIAGNOSIS — I495 Sick sinus syndrome: Secondary | ICD-10-CM | POA: Diagnosis not present

## 2018-01-06 DIAGNOSIS — R9431 Abnormal electrocardiogram [ECG] [EKG]: Secondary | ICD-10-CM

## 2018-01-06 DIAGNOSIS — Z87891 Personal history of nicotine dependence: Secondary | ICD-10-CM | POA: Insufficient documentation

## 2018-01-06 DIAGNOSIS — I10 Essential (primary) hypertension: Secondary | ICD-10-CM | POA: Insufficient documentation

## 2018-01-06 DIAGNOSIS — I455 Other specified heart block: Secondary | ICD-10-CM | POA: Diagnosis not present

## 2018-01-06 LAB — CBC
HEMATOCRIT: 42.5 % (ref 36.0–46.0)
HEMOGLOBIN: 13.6 g/dL (ref 12.0–15.0)
MCH: 30 pg (ref 26.0–34.0)
MCHC: 32 g/dL (ref 30.0–36.0)
MCV: 93.6 fL (ref 78.0–100.0)
Platelets: 232 10*3/uL (ref 150–400)
RBC: 4.54 MIL/uL (ref 3.87–5.11)
RDW: 12.7 % (ref 11.5–15.5)
WBC: 8.3 10*3/uL (ref 4.0–10.5)

## 2018-01-06 LAB — BASIC METABOLIC PANEL
ANION GAP: 11 (ref 5–15)
BUN: 12 mg/dL (ref 8–23)
CHLORIDE: 105 mmol/L (ref 98–111)
CO2: 26 mmol/L (ref 22–32)
Calcium: 9.8 mg/dL (ref 8.9–10.3)
Creatinine, Ser: 1.17 mg/dL — ABNORMAL HIGH (ref 0.44–1.00)
GFR calc non Af Amer: 45 mL/min — ABNORMAL LOW (ref >60)
GFR, EST AFRICAN AMERICAN: 52 mL/min — AB (ref >60)
GLUCOSE: 152 mg/dL — AB (ref 70–99)
POTASSIUM: 3.8 mmol/L (ref 3.5–5.1)
Sodium: 142 mmol/L (ref 135–145)

## 2018-01-06 LAB — I-STAT TROPONIN, ED: TROPONIN I, POC: 0.03 ng/mL (ref 0.00–0.08)

## 2018-01-06 NOTE — ED Provider Notes (Signed)
[facility] Emergency Department Provider Note MRN:  [id number]  Arrival date & time: 01/06/18     Chief Complaint   Palpitations History of Present Illness   Sharon Knight is a 72 y.o. year-old female with a history of hypertension, A. fib presenting to the ED with chief complaint of palpitations.  Patient explains that she had a recent admission to [facility] long hospital for pneumonia, during which she also developed A. fib.  She was discharged with a cardiac monitor.  She was called this morning at 2 AM by the cardiologist who were reviewing her cardiac tracing, and was advised to come to the emergency department for evaluation.  According to report, patient was experiencing 6-second pauses during sleep.  With the exception of 1 or 2 episodes of brief "flutters" in the chest, patient is completely asymptomatic.  Denies fever, no headache or vision change, no chest pain or shortness of breath, no abdominal pain, no numbness or weakness in the arms or legs.  Was told that she may need a pacemaker.  Review of Systems  A complete 10 system review of systems was obtained and all systems are negative except as noted in the HPI and PMH.   Patient's Health History    Past Medical History:  Diagnosis Date  . Arthritis   . Bilateral cataracts    Dr Kathrin Penner  . Hyperlipidemia   . Hypertension     Past Surgical History:  Procedure Laterality Date  . COLONOSCOPY     Dr Fuller Plan  . 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 OVARIAN CYST REMOVAL      Family History  Adopted: Yes  Problem Relation Age of Onset  . Colon cancer Neg Hx   . Stomach cancer Neg Hx     Social History   Socioeconomic History  . Marital status: Married    Spouse name: Not on file  . Number of children: Not on file  . Years of education: Not on file  . Highest education level: Not on file  Occupational History  . Not on file  Social Needs  . Financial resource strain: Not on file  .  Food insecurity:    Worry: Not on file    Inability: Not on file  . Transportation needs:    Medical: Not on file    Non-medical: Not on file  Tobacco Use  . Smoking status: Former Smoker    Last attempt to quit: 08/27/1984    Years since quitting: 33.3  . Smokeless tobacco: Never Used  . Tobacco comment: smoked age [age], up to 1.5-2 ppd  Substance and Sexual Activity  . Alcohol use: Yes    Alcohol/week: 3.0 standard drinks    Types: 3 Cans of beer per week    Comment: 3 x weekly  . Drug use: No  . Sexual activity: Not on file  Lifestyle  . Physical activity:    Days per week: Patient refused    Minutes per session: Patient refused  . Stress: Not on file  Relationships  . Social connections:    Talks on phone: Not on file    Gets together: Not on file    Attends religious service: Not on file    Active member of club or organization: Not on file    Attends meetings of clubs or organizations: Not on file    Relationship status: Not on file  . Intimate partner violence:    Fear of  current or ex partner: Not on file    Emotionally abused: Not on file    Physically abused: Not on file    Forced sexual activity: Not on file  Other Topics Concern  . Not on file  Social History Narrative   Exercise: none     Physical Exam  Vital Signs and Nursing Notes reviewed Vitals:   01/06/18 1643 01/06/18 1646  BP:  (!) 120/57  Pulse: 63   Resp:  17  Temp:    SpO2:  100%    CONSTITUTIONAL: Well-appearing, NAD NEURO:  Alert and oriented x 3, no focal deficits EYES:  eyes equal and reactive ENT/NECK:  no LAD, no JVD CARDIO: Irregular rhythm, normal rate, well-perfused, normal S1 and S2 PULM:  CTAB no wheezing or rhonchi GI/GU:  normal bowel sounds, non-distended, non-tender MSK/SPINE:  No gross deformities, no edema SKIN:  no rash, atraumatic PSYCH:  Appropriate speech and behavior  Diagnostic and Interventional Summary    EKG Interpretation  Date/Time:  Thursday  January 06 2018 12:15:18 EDT Ventricular Rate:  64 PR Interval:  202 QRS Duration: 100 QT Interval:  424 QTC Calculation: 437 R Axis:   11 Text Interpretation:  Normal sinus rhythm Cannot rule out Anterior infarct , age undetermined Abnormal ECG Confirmed by Gerlene Fee [phone]) on 01/06/2018 2:22:20 PM      Labs Reviewed  BASIC METABOLIC PANEL - Abnormal; Notable for the following components:      Result Value   Glucose, Bld 152 (*)    Creatinine, Ser 1.17 (*)    GFR calc non Af Amer 45 (*)    GFR calc Af Amer 52 (*)    All other components within normal limits  CBC  I-STAT TROPONIN, ED    No orders to display    Medications - No data to display  Procedures Critical Care  ED Course and Medical Decision Making  I have reviewed the triage vital signs and the nursing notes.  Pertinent labs & imaging results that were available during my care of the patient were reviewed by me and considered in my medical decision making (see below for details).  Long pauses during sleep in the 72 year old female with recent pneumonia and A. fib.  Sent here by cardiology for possible pacemaker placement.  Vital signs stable, well-appearing, asymptomatic currently.  On cardiac monitoring, defibrillator pads in place, will consult cardiology.  No sinus pauses here in the ED, asymptomatic, evaluated by cardiology, recommending stopping of nightly dose of metoprolol.  Patient will follow-up closely with her regular doctors.  After the discussed management above, the patient was determined to be safe for discharge.  The patient was in agreement with this plan and all questions regarding their care were answered.  ED return precautions were discussed and the patient will return to the ED with any significant worsening of condition.      Barth Kirks. Sedonia Small, [place] [email or web address]  Final Clinical Impressions(s) / ED Diagnoses     ICD-10-CM    1. Abnormal EKG R94.31   2. Sinus pause I45.5     ED Discharge Orders    None         Maudie Flakes, MD 01/06/18 [phone]

## 2018-01-06 NOTE — ED Triage Notes (Signed)
Pt arrives to ED after cardiology called her to inform her she was having cardiac pauses last night. Pt arrives and states she does not "feel bad".

## 2018-01-06 NOTE — ED Notes (Signed)
Placed patient on the bedpan  

## 2018-01-06 NOTE — Telephone Encounter (Signed)
Received multiple reading from [facility] during the night. Patient had pauses ranging from 3-7 seconds with badycardia during the night. Per Dr Oval Linsey patient needs to go to ED for evaluation ? Pacemaker. Advised patient. Spoke with Janett Billow at [facility] ED and will fax strips to [phone]. Trish notified and will fax to [phone]

## 2018-01-06 NOTE — Discharge Instructions (Addendum)
You were evaluated in the Emergency Department and after careful evaluation, we did not find any emergent condition requiring admission or further testing in the hospital.  Please stop taking your nighttime dose of metoprolol and follow-up with your regular doctors.  Please return to the Emergency Department if you experience any worsening of your condition.  We encourage you to follow up with a primary care provider.  Thank you for allowing [place] to be a part of your care.

## 2018-01-06 NOTE — ED Notes (Signed)
Unable to place defibrillator pads on patient without removing event monitor. Pt has additional monitor stickers if it is necessary to replace it back on pt.

## 2018-01-06 NOTE — ED Notes (Signed)
Zoll pads with pt sticker placed in Paula's office

## 2018-01-06 NOTE — ED Notes (Signed)
Pt alert and oriented in NAD. Pt verbalized understanding of discharge instructions. 

## 2018-01-06 NOTE — Consult Note (Addendum)
Cardiology Consultation:   Patient ID: Carrye Goller; [id number]; 1946/02/15   Admit date: 01/06/2018 Date of Consult: 01/06/2018  Primary Care Provider: Binnie Rail, MD Primary Cardiologist: Skeet Latch, MD  Primary Electrophysiologist:  Dr. Curt Bears   Patient Profile:   Jacquetta Polhamus is a 72 y.o. female with a hx of HTN, HLD, R nephrectomy, PAFib who is being seen today for the evaluation of pauses on an event monitor at the request of Dr. Sedonia Small.  History of Present Illness:   Ms. Seabolt recently had an up-tick in her AFib episodes after a bout of pneumonia/febrile illness.  She was placed on Eliquis then and eventually had DCCV.  An event monitor was done noting PAFib as well as pauses.  She was referred to EP and seen by Dr. Curt Bears who stopped her metoprolol and added flecainide to her dilt.  She was to have started the flecainide 01/01/18 with plans for ETT 01/10/18.  She was referred to the ER via our office after getting strips with a number of pauses that occurred overnight last night.   She has been feeling well.  She denies any CP or SOB, no dizziness, near syncope or syncope.  Her husband is at bedside and reports that in the last week or so she has had good energy, finally back towards normal after her pneumonia.  She reports that after stopping her metoprolol she felt a little "funny", no palpitations, not lightheaded, just funny, and was recommended to resume the metoprolol 25mg  1/2tab BID last week.  She goes to bed usually about 11 and wakes typically 6:30.  Confirms last night she was sleeping already every time they called to check on her.    LABS K+ 3.8 BUN/Creat 12/1.17 poc Trop 0.03 WBC 8.3 H/H 13/42 Plts 232  Past Medical History:  Diagnosis Date  . Arthritis   . Bilateral cataracts    Dr Kathrin Penner  . Hyperlipidemia   . Hypertension     Past Surgical History:  Procedure Laterality Date  . COLONOSCOPY     Dr Fuller Plan  . NEPHRECTOMY     right; for  cyst on kidney  . OVARIAN CYST REMOVAL       Home Medications:  Prior to Admission medications   Medication Sig Start Date End Date Taking? Authorizing Provider  acetaminophen (TYLENOL) 325 MG tablet Take 1 tablet (325 mg total) by mouth 2 (two) times daily as needed (pain.). 12/16/17  Yes Aline August, MD  apixaban (ELIQUIS) 5 MG TABS tablet Take 1 tablet (5 mg total) by mouth 2 (two) times daily. 12/16/17  Yes Aline August, MD  atorvastatin (LIPITOR) 10 MG tablet Take 1 tablet (10 mg total) by mouth daily at 6 PM. 12/16/17  Yes Starla Link, Kshitiz, MD  diltiazem (CARDIZEM CD) 180 MG 24 hr capsule Take 1 capsule (180 mg total) by mouth daily. 12/22/17  Yes Almyra Deforest, PA  flecainide (TAMBOCOR) 100 MG tablet Take 1 tablet (100 mg total) by mouth 2 (two) times daily. 12/30/17  Yes Genevieve Arbaugh Hassell Done, MD  hydrochlorothiazide (HYDRODIURIL) 25 MG tablet TAKE (1/2) TABLET DAILY. Patient taking differently: Take 12.5 mg by mouth daily.  12/21/17  Yes Burns, Claudina Lick, MD  metoprolol tartrate (LOPRESSOR) 25 MG tablet Take 0.5 tablets (12.5 mg total) by mouth 2 (two) times daily. 12/31/17 03/31/18 Yes Croitoru, Mihai, MD  Multiple Vitamin (MULTIVITAMIN) tablet Take 1 tablet by mouth daily.   Yes [provider]    Inpatient Medications: Scheduled Meds:  Continuous Infusions:  PRN Meds:   Allergies:    Allergies  Allergen Reactions  . Ibuprofen Other (See Comments)    Dizziness "made me woozy"    Social History:   Social History   Socioeconomic History  . Marital status: Married    Spouse name: Not on file  . Number of children: Not on file  . Years of education: Not on file  . Highest education level: Not on file  Occupational History  . Not on file  Social Needs  . Financial resource strain: Not on file  . Food insecurity:    Worry: Not on file    Inability: Not on file  . Transportation needs:    Medical: Not on file    Non-medical: Not on file  Tobacco Use  . Smoking  status: Former Smoker    Last attempt to quit: 08/27/1984    Years since quitting: 33.3  . Smokeless tobacco: Never Used  . Tobacco comment: smoked age [age], up to 1.5-2 ppd  Substance and Sexual Activity  . Alcohol use: Yes    Alcohol/week: 3.0 standard drinks    Types: 3 Cans of beer per week    Comment: 3 x weekly  . Drug use: No  . Sexual activity: Not on file  Lifestyle  . Physical activity:    Days per week: Patient refused    Minutes per session: Patient refused  . Stress: Not on file  Relationships  . Social connections:    Talks on phone: Not on file    Gets together: Not on file    Attends religious service: Not on file    Active member of club or organization: Not on file    Attends meetings of clubs or organizations: Not on file    Relationship status: Not on file  . Intimate partner violence:    Fear of current or ex partner: Not on file    Emotionally abused: Not on file    Physically abused: Not on file    Forced sexual activity: Not on file  Other Topics Concern  . Not on file  Social History Narrative   Exercise: none    Family History:   Family History  Adopted: Yes  Problem Relation Age of Onset  . Colon cancer Neg Hx   . Stomach cancer Neg Hx      ROS:  Please see the history of present illness.  All other ROS reviewed and negative.     Physical Exam/Data:   Vitals:   01/06/18 1216 01/06/18 1345  BP: (!) 153/60 115/66  Pulse: 65 62  Resp: 16 17  Temp: 98.6 F (37 C)   TempSrc: Oral   SpO2: 98% 100%   No intake or output data in the 24 hours ending 01/06/18 1415 There were no vitals filed for this visit. There is no height or weight on file to calculate BMI.  General:  Well nourished, well developed, in no acute distress HEENT: normal Lymph: no adenopathy Neck: no JVD Endocrine:  No thryomegaly Vascular: No carotid bruits  Cardiac:   RRR; no murmurs, gallops or rubs Lungs:  CTA b/l, no wheezing, rhonchi or rales  Abd: soft,  nontender Ext: no edema Musculoskeletal:  No deformities Skin: warm and dry  Neuro:  No gross focal abnormalities noted Psych:  Normal affect   EKG:  The EKG was personally reviewed and demonstrates:   SR 62bpm, PR 226ms Telemetry:  Telemetry was personally reviewed and demonstrates:  SR, frequent PACs,  no significant bradycardia or pauses  Relevant CV Studies:  EM tracings dated 01/05/18-01/06/18 are reviewed noted SR/PAF with pauses up to 6.4-7 seconds, these all occurred late evening/overnight hours.  12/11/17 TTE Study Conclusions - Left ventricle: The cavity size was normal. Wall thickness was   increased in a pattern of moderate LVH. Systolic function was   vigorous. The estimated ejection fraction was in the range of 65%   to 70%. Wall motion was normal; there were no regional wall   motion abnormalities. The study was not technically sufficient to   allow evaluation of LV diastolic dysfunction due to atrial   fibrillation. - Aortic valve: Valve area (VTI): 2.14 cm^2. Valve area (Vmax):   2.43 cm^2. Valve area (Vmean): 2.33 cm^2. - Mitral valve: Valve area by continuity equation (using LVOT   flow): 1.61 cm^2. - Atrial septum: No defect or patent foramen ovale was identified.  Laboratory Data:  Chemistry Recent Labs  Lab 01/06/18 1224  NA 142  K 3.8  CL 105  CO2 26  GLUCOSE 152*  BUN 12  CREATININE 1.17*  CALCIUM 9.8  GFRNONAA 45*  GFRAA 52*  ANIONGAP 11    No results for input(s): PROT, ALBUMIN, AST, ALT, ALKPHOS, BILITOT in the last 168 hours. Hematology Recent Labs  Lab 01/06/18 1224  WBC 8.3  RBC 4.54  HGB 13.6  HCT 42.5  MCV 93.6  MCH 30.0  MCHC 32.0  RDW 12.7  PLT 232   Cardiac EnzymesNo results for input(s): TROPONINI in the last 168 hours.  Recent Labs  Lab 01/06/18 1236  TROPIPOC 0.03    BNPNo results for input(s): BNP, PROBNP in the last 168 hours.  DDimer No results for input(s): DDIMER in the last 168 hours.  Radiology/Studies:    No results found.  Assessment and Plan:   1. PAFib     CHA2DS2Vasc is 3, on Eliquis  2. Nocturnal pauses      No noted daytime slowing, no symptoms of bradycardia     She denies snoring or symptoms suggestive of apnea      She did get put back on the metoprolol recently with some faster rates prior to starting the flecainide     I anticipate ok to discharge with stopping her nighttime metoprolol if not stopping it altogether with close out patient f/u She is scheduled for her stress test on Monday, has Dr. Oval Linsey in 6 weeks.  Dr. Curt Bears to see for final recommendations   For questions or updates, please contact [facility] Please consult [email or web address] for contact info under Cardiology/STEMI.   Signed, Baldwin Jamaica, PA-C  01/06/2018 2:15 PM  I have seen and examined this patient with Tommye Standard.  Agree with above, note added to reflect my findings.  On exam, iRRR, no murmurs, lungs clear. Patient prestned to the ER after being called for pauses at night on her monitor. Currently asymptomatic. She is currently having multiple APCs with compensatory pauses. Would continue current home meds with the exception of holding nighttime dose of metoprolol. Continue monitoring. She may require pacemaker implant in the future but no indication at this point.  [facility] sign off.   Medication Recommendations:  Hold nighttime metoprolol dose Other recommendations (labs, testing, etc):  none Follow up as an outpatient:  Previously scheduled     Keisuke Hollabaugh M. Dailyn Kempner MD 01/06/2018 4:28 PM

## 2018-01-07 ENCOUNTER — Telehealth: Payer: Self-pay

## 2018-01-07 ENCOUNTER — Ambulatory Visit (HOSPITAL_COMMUNITY): Payer: Medicare Other | Admitting: Nurse Practitioner

## 2018-01-07 NOTE — Telephone Encounter (Signed)
Pt is aware of response.

## 2018-01-07 NOTE — Telephone Encounter (Signed)
Per Dr.Lindsborg strips of patient's cardiac monitor faxed to [facility] attn: Dr.Camnitz.

## 2018-01-07 NOTE — Telephone Encounter (Signed)
Copied from [place] [phone]. Topic: Quick Communication - See Telephone Encounter >> Jan 07, 2018  8:19 AM Margot Ables wrote: CRM for notification. See Telephone encounter for: 01/07/18. Pt has to wear a heart monitor until 9/20. She is supposed to have repeat chest xray next week but is asking if ok to have it done after 9/20? Please advise. Ok to leave detailed msg on cell #.

## 2018-01-07 NOTE — Telephone Encounter (Signed)
Yes ok to delay the chest xray a little.

## 2018-01-10 ENCOUNTER — Other Ambulatory Visit: Payer: Self-pay | Admitting: Cardiology

## 2018-01-10 ENCOUNTER — Ambulatory Visit (INDEPENDENT_AMBULATORY_CARE_PROVIDER_SITE_OTHER): Payer: Medicare Other

## 2018-01-10 DIAGNOSIS — Z79899 Other long term (current) drug therapy: Secondary | ICD-10-CM | POA: Diagnosis not present

## 2018-01-10 DIAGNOSIS — Z5181 Encounter for therapeutic drug level monitoring: Secondary | ICD-10-CM

## 2018-01-10 DIAGNOSIS — I480 Paroxysmal atrial fibrillation: Secondary | ICD-10-CM | POA: Diagnosis not present

## 2018-01-13 ENCOUNTER — Other Ambulatory Visit: Payer: Self-pay | Admitting: Internal Medicine

## 2018-01-13 ENCOUNTER — Telehealth: Payer: Self-pay | Admitting: Cardiology

## 2018-01-13 NOTE — Telephone Encounter (Signed)
I do not, there is a note that we are not supposed to call --and I rec'd call very early  In AM

## 2018-01-13 NOTE — Telephone Encounter (Signed)
Do you know if she was awake?

## 2018-01-13 NOTE — Telephone Encounter (Signed)
Monitoring device called with 4.7 sec pause during SB and a 1.2 sec sinus arrest.    She was seen in ER 01/06/18 for similar.  BB at hs was held.  Will send to Dr. Oval Linsey.

## 2018-01-13 NOTE — Telephone Encounter (Signed)
Followed up with patient on pause occurrence.  Reviewed event that occurred at 0247.  Nocturnal brady, HR 59. Pt denies any symptoms/issues. Will have Dr. Curt Bears review next week when he returns to office.

## 2018-01-17 NOTE — Telephone Encounter (Signed)
Dr. Curt Bears reviewed.  No changes to treatment plan needed.

## 2018-02-03 ENCOUNTER — Ambulatory Visit
Admission: RE | Admit: 2018-02-03 | Discharge: 2018-02-03 | Disposition: A | Discharge status: Home / Self Care | Payer: Medicare Other | Source: Ambulatory Visit | Attending: Obstetrics and Gynecology | Admitting: Obstetrics and Gynecology

## 2018-02-03 ENCOUNTER — Ambulatory Visit: Payer: Medicare Other

## 2018-02-03 DIAGNOSIS — Z1231 Encounter for screening mammogram for malignant neoplasm of breast: Secondary | ICD-10-CM

## 2018-02-15 ENCOUNTER — Ambulatory Visit (INDEPENDENT_AMBULATORY_CARE_PROVIDER_SITE_OTHER): Payer: Medicare Other | Admitting: *Deleted

## 2018-02-15 DIAGNOSIS — Z23 Encounter for immunization: Secondary | ICD-10-CM | POA: Diagnosis not present

## 2018-02-22 ENCOUNTER — Ambulatory Visit: Payer: Medicare Other | Admitting: Cardiovascular Disease

## 2018-02-22 ENCOUNTER — Encounter: Payer: Self-pay | Admitting: Cardiovascular Disease

## 2018-02-22 VITALS — BP 121/60 | HR 49 | Ht 66.0 in | Wt 165.0 lb

## 2018-02-22 DIAGNOSIS — I480 Paroxysmal atrial fibrillation: Secondary | ICD-10-CM | POA: Diagnosis not present

## 2018-02-22 DIAGNOSIS — I10 Essential (primary) hypertension: Secondary | ICD-10-CM | POA: Diagnosis not present

## 2018-02-22 DIAGNOSIS — E785 Hyperlipidemia, unspecified: Secondary | ICD-10-CM

## 2018-02-22 DIAGNOSIS — I455 Other specified heart block: Secondary | ICD-10-CM | POA: Diagnosis not present

## 2018-02-22 LAB — EXERCISE TOLERANCE TEST
CHL CUP RESTING HR STRESS: 71 {beats}/min
CHL RATE OF PERCEIVED EXERTION: 15
CSEPEW: 4.6 METS
CSEPPHR: 104 {beats}/min
Exercise duration (min): 2 min
Exercise duration (sec): 25 s
MPHR: 147 {beats}/min
Percent HR: 70 %

## 2018-02-22 NOTE — Patient Instructions (Addendum)
Medication Instructions:  TAKE TYLENOL 650 TWICE A DAY AS NEEDED   STOP METOPROLOL   If you need a refill on your cardiac medications before your next appointment, please call your pharmacy.   Lab work: NONE  Testing/Procedures: NONE  Follow-Up: At [facility], you and your health needs are our priority.  As part of our continuing mission to provide you with exceptional heart care, we have created designated Provider Care Teams.  These Care Teams include your primary Cardiologist (physician) and Advanced Practice Providers (APPs -  Physician Assistants and Nurse Practitioners) who all work together to provide you with the care you need, when you need it. You will need a follow up appointment in 3 months. You may see Skeet Latch, MD or one of the following Advanced Practice Providers on your designated Care Team:   Kerin Ransom, PA-C Roby Lofts, [place] . Sande Rives, PA-C

## 2018-02-22 NOTE — Progress Notes (Signed)
Cardiology Office Note   Date:  02/22/2018   ID:  Sharon Knight, DOB Mar 05, 1946, MRN [id number]  PCP:  Sharon Rail, MD  Cardiologist:   Sharon Latch, MD   No chief complaint on file.    History of Present Illness: Sharon Knight is a 72 y.o. female with paroxysmal atrial fibrillation, hypertension, hyperlipidemia and right nephrectomywho presents for follow up.  She was admitted 12/2017 with atrial fibrillation with RVR in the setting of sepsis and pneumonia.  She was started on Eliquis and her home dose of metoprolol was increased.  Echocardiogram 12/11/2017 revealed LVEF 65 to 70% with moderate LVH.  Her left atrium was not dilated.  She followed up with Sharon Deforest, PA on 12/22/17 and was back in sinus rhythm.  She had a 30-day event monitor that did not reveal recurrent atrial fibrillation.  She followed up with Dr. Curt Knight 12/30/2017 and was still in atrial fibrillation at that time.  She was started on flecainide and metoprolol was discontinued.  She continued to wear her monitor and had multiple episodes of pauses overnight.  She was seen by Dr. Curt Knight in the ED 01/06/2018 and her evening dose of metoprolol was discontinued.  Given that her pauses occurred only overnight there were no plans for pacemaker implantation at that time.  Yesterday she had some substernal chest pain while driving.  It improved with eating lunch and she thinks that it was gas.  She hasn't had any exertional symptoms.  She has sciatic pain, R knee pain and bilateral plantar fasciitis.  She wants to walk but can't due ot pain.  She has tried taking acetamionophen twice per day with minimal relief.  She can't find a comfortable position.  She hasn't had any lower extremity edema, orthpnea or PND.  She denies palpitations, lightheadedness or dizziness.     Past Medical History:  Diagnosis Date  . Arthritis   . Bilateral cataracts    Dr Sharon Knight  . Hyperlipidemia   . Hypertension     Past Surgical  History:  Procedure Laterality Date  . COLONOSCOPY     Dr Sharon Knight  . 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 OVARIAN CYST REMOVAL       Current Outpatient Medications  Medication Sig Dispense Refill  . acetaminophen (TYLENOL) 325 MG tablet Take 650 mg by mouth 2 (two) times daily as needed.    Marland Kitchen atorvastatin (LIPITOR) 10 MG tablet TAKE 1 TABLET ONCE DAILY AT 6 PM. 90 tablet 1  . diltiazem (CARDIZEM CD) 180 MG 24 hr capsule Take 1 capsule (180 mg total) by mouth daily. 30 capsule 5  . ELIQUIS 5 MG TABS tablet TAKE 1 TABLET BY MOUTH TWICE DAILY. 60 tablet 1  . flecainide (TAMBOCOR) 100 MG tablet Take 1 tablet (100 mg total) by mouth 2 (two) times daily. 60 tablet 3  . hydrochlorothiazide (HYDRODIURIL) 25 MG tablet TAKE (1/2) TABLET DAILY. (Patient taking differently: Take 12.5 mg by mouth daily. ) 45 tablet 1  . Multiple Vitamin (MULTIVITAMIN) tablet Take 1 tablet by mouth daily.     No current facility-administered medications for this visit.     Allergies:   Ibuprofen    Social History:  The patient  reports that she quit smoking about 33 years ago. She has never used smokeless tobacco. She reports that she drinks about 3.0 standard drinks of alcohol per week. She reports that she does not use drugs.   Family History:  The patient's family history is not on file. She was adopted.    ROS:  Please see the history of present illness.   Otherwise, review of systems are positive for none.   All other systems are reviewed and negative.    PHYSICAL EXAM: VS:  BP 121/60   Pulse (!) 49   Ht 5\' 6"  (1.676 m)   Wt 165 lb (74.8 kg)   BMI 26.63 kg/m  , BMI Body mass index is 26.63 kg/m. GENERAL:  Well appearing HEENT:  Pupils equal round and reactive, fundi not visualized, oral mucosa unremarkable NECK:  No jugular venous distention, waveform within normal limits, carotid upstroke brisk and symmetric, no bruits, no thyromegaly LYMPHATICS:  No cervical adenopathy LUNGS:  Clear to  auscultation bilaterally HEART:  Bradycardic.  Regular rhythm.  PMI not displaced or sustained,S1 and S2 within normal limits, no S3, no S4, no clicks, no rubs, no murmurs ABD:  Flat, positive bowel sounds normal in frequency in pitch, no bruits, no rebound, no guarding, no midline pulsatile mass, no hepatomegaly, no splenomegaly EXT:  2 plus pulses throughout, no edema, no cyanosis no clubbing SKIN:  No rashes no nodules NEURO:  Cranial nerves II through XII grossly intact, motor grossly intact throughout PSYCH:  Cognitively intact, oriented to person place and time   EKG:  EKG is not ordered today.   ETT 01/25/18: 4.6 METS on Bruce protocol.  No ischemia.  Echocardiogram 12/11/2017 Study Conclusions - Left ventricle: The cavity size was normal. Wall thickness was increased in a pattern of moderate LVH. Systolic function was vigorous. The estimated ejection fraction was in the range of 65% to 70%. Wall motion was normal; there were no regional wall motion abnormalities. The study was not technically sufficient to allow evaluation of LV diastolic dysfunction due to atrial fibrillation. - Aortic valve: Valve area (VTI): 2.14 cm^2. Valve area (Vmax): 2.43 cm^2. Valve area (Vmean): 2.33 cm^2. - Mitral valve: Valve area by continuity equation (using LVOT flow): 1.61 cm^2. - Atrial septum: No defect or patent foramen ovale was identified  30 Day Event Monitor 12/23/17:   Quality: Fair.  Baseline artifact. Predominant rhythm: sinus rhythm Average heart rate: 68 bpm Max heart rate: 174 bpm Min heart rate: 38 bpm 199 pauses >3 seconds Pauses noted up to 6 seconds. All occurred over night or early morning. PVCs Atrial flutter 16% (71% controlled, <1% slow, 29% rapid)   Recent Labs: 12/10/2017: ALT 10 12/11/2017: TSH 3.100 12/16/2017: Magnesium 2.2 01/06/2018: BUN 12; Creatinine, Ser 1.17; Hemoglobin 13.6; Platelets 232; Potassium 3.8; Sodium 142    Lipid Panel      Component Value Date/Time   CHOL 169 09/09/2017 0943   CHOL 174 12/05/2012 1538   TRIG 86.0 09/09/2017 0943   TRIG 109 12/05/2012 1538   HDL 60.30 09/09/2017 0943   HDL 60 12/05/2012 1538   CHOLHDL 3 09/09/2017 0943   VLDL 17.2 09/09/2017 0943   LDLCALC 91 09/09/2017 0943   LDLCALC 92 12/05/2012 1538      Wt Readings from Last 3 Encounters:  02/22/18 165 lb (74.8 kg)  12/30/17 164 lb (74.4 kg)  12/22/17 163 lb 3.2 oz (74 kg)      ASSESSMENT AND Knight:  # Paroxysmal atrial flutter: # Sick sinus syndrome: Monitor showed 16% atrial fibrillation.  She had some RVR during the study.  Overall her rates are well-controlled.  Today her heart rate in sinus rhythm is 49 bpm.  She denies any lightheadedness or dizziness.  She did continue to have some prolonged pauses overnight even after reducing metoprolol.  We will stop metoprolol.  No plans for pacemaker at this time.  She has continued follow-up with Dr. Curt Knight.  Continue diltiazem and Eliquis.  # Hypertension:  BP controlled on HCTZ and diltiazem.  Stop metoprolol as above.  # Hyperlipidemia: Continue atorvastatin.    Current medicines are reviewed at length with the patient today.  The patient does not have concerns regarding medicines.  The following changes have been made: Stop metoprolol  Labs/ tests ordered today include:  No orders of the defined types were placed in this encounter.    Disposition:   FU with Joelene Barriere C. Oval Linsey, MD, [facility] in 3 months    Signed, Betsabe Iglesia C. Oval Linsey, MD, [facility]  02/22/2018 12:46 PM    [place]

## 2018-03-03 DIAGNOSIS — H5712 Ocular pain, left eye: Secondary | ICD-10-CM | POA: Diagnosis not present

## 2018-03-03 DIAGNOSIS — H02054 Trichiasis without entropian left upper eyelid: Secondary | ICD-10-CM | POA: Diagnosis not present

## 2018-03-03 DIAGNOSIS — H16102 Unspecified superficial keratitis, left eye: Secondary | ICD-10-CM | POA: Diagnosis not present

## 2018-03-13 ENCOUNTER — Other Ambulatory Visit: Payer: Self-pay | Admitting: Internal Medicine

## 2018-03-16 NOTE — Patient Instructions (Addendum)
Have x-rays and blood work today.  Tests ordered today. Your results will be released to [facility] (or called to you) after review, usually within 72hours after test completion. If any changes need to be made, you will be notified at that same time.  Monitor your BP at home - it should be less than 140/90.    Medications reviewed and updated.  Changes include :   none   Let me know if your back pain does not improve and you would like to see a specialist.    Please followup in 6 months

## 2018-03-16 NOTE — Progress Notes (Signed)
Subjective:    Patient ID: Sharon Knight, female    DOB: March 03, 1946, 72 y.o.   MRN: [id number]  HPI The patient is here for follow up.  P AFib, Hypertension: She is taking her medication daily. She is compliant with a low sodium diet.  She denies chest pain, palpitations, edema, shortness of breath and regular headaches. She is not exercising regularly.  She does not monitor her blood pressure at home.    Prediabetes:  She is compliant with a low sugar/carbohydrate diet.  She is not exercising regularly.  Hyperlipidemia: She is taking her medication daily. She is compliant with a low fat/cholesterol diet. She is not exercising regularly. She denies myalgias.   Lower back pain:  It started a couple of months ago.  She has pain across her buttock region b/l and radiates down to the left groin and right hip.  Heat has not helped.  She was concerned it was from the atorvastatin and she stopped that a few days ago - it is slightly better.  She denies back issues in the past.  She is taking tylenol.      Medications and allergies reviewed with patient and updated if appropriate.  Patient Active Problem List   Diagnosis Date Noted  . Bilateral low back pain 03/17/2018  . Gout 12/21/2017  . Lobar pneumonia ([place]) 12/10/2017  . Paroxysmal atrial fibrillation ([place]) 12/10/2017  . AKI (acute kidney injury) ([place]) 12/10/2017  . Prediabetes 02/05/2016  . History of colonic polyps 10/22/2014  . 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 Vitamin D deficiency 10/31/2012  . ECZEMA 03/03/2010  . 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 Hyperlipidemia 08/02/2007  . Essential hypertension 08/02/2007  . Osteopenia 08/02/2007    Current Outpatient Medications on File Prior to Visit  Medication Sig Dispense Refill  . acetaminophen (TYLENOL) 325 MG tablet Take 650 mg by mouth 2 (two) times daily as needed.    . diltiazem (CARDIZEM CD) 180 MG 24 hr capsule Take 1 capsule (180 mg total) by mouth  daily. 30 capsule 5  . ELIQUIS 5 MG TABS tablet TAKE 1 TABLET BY MOUTH TWICE DAILY. 60 tablet 5  . flecainide (TAMBOCOR) 100 MG tablet Take 1 tablet (100 mg total) by mouth 2 (two) times daily. 60 tablet 3  . hydrochlorothiazide (HYDRODIURIL) 25 MG tablet TAKE (1/2) TABLET DAILY. (Patient taking differently: Take 12.5 mg by mouth daily. ) 45 tablet 1  . Multiple Vitamin (MULTIVITAMIN) tablet Take 1 tablet by mouth daily.    Marland Kitchen atorvastatin (LIPITOR) 10 MG tablet TAKE 1 TABLET ONCE DAILY AT 6 PM. (Patient not taking: Reported on 03/17/2018) 90 tablet 1   No current facility-administered medications on file prior to visit.     Past Medical History:  Diagnosis Date  . Arthritis   . Bilateral cataracts    Dr Kathrin Penner  . Hyperlipidemia   . Hypertension     Past Surgical History:  Procedure Laterality Date  . COLONOSCOPY     Dr Fuller Plan  . 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 OVARIAN CYST REMOVAL      Social History   Socioeconomic History  . Marital status: Married    Spouse name: Not on file  . Number of children: Not on file  . Years of education: Not on file  . Highest education level: Not on file  Occupational History  . Not on file  Social Needs  . Financial resource strain: Not  on file  . Food insecurity:    Worry: Not on file    Inability: Not on file  . Transportation needs:    Medical: Not on file    Non-medical: Not on file  Tobacco Use  . Smoking status: Former Smoker    Last attempt to quit: 08/27/1984    Years since quitting: 33.5  . Smokeless tobacco: Never Used  . Tobacco comment: smoked age [age], up to 1.5-2 ppd  Substance and Sexual Activity  . Alcohol use: Yes    Alcohol/week: 3.0 standard drinks    Types: 3 Cans of beer per week    Comment: 3 x weekly  . Drug use: No  . Sexual activity: Not on file  Lifestyle  . Physical activity:    Days per week: Patient refused    Minutes per session: Patient refused  . Stress: Not on file    Relationships  . Social connections:    Talks on phone: Not on file    Gets together: Not on file    Attends religious service: Not on file    Active member of club or organization: Not on file    Attends meetings of clubs or organizations: Not on file    Relationship status: Not on file  Other Topics Concern  . Not on file  Social History Narrative   Exercise: none    Family History  Adopted: Yes  Problem Relation Age of Onset  . Colon cancer Neg Hx   . Stomach cancer Neg Hx     Review of Systems  Constitutional: Negative for chills and fever.  Respiratory: Negative for cough, shortness of breath and wheezing.   Cardiovascular: Negative for chest pain, palpitations and leg swelling.  Musculoskeletal: Positive for arthralgias (knee) and back pain.  Neurological: Positive for light-headedness (occasionally) and numbness (finger tips). Negative for weakness and headaches.  Hematological: Does not bruise/bleed easily.       Objective:   Vitals:   03/17/18 0943  BP: (!) 142/86  Pulse: 66  Resp: 16  Temp: 98.3 F (36.8 C)  SpO2: 99%   BP Readings from Last 3 Encounters:  03/17/18 (!) 142/86  02/22/18 121/60  01/06/18 (!) 157/78   Wt Readings from Last 3 Encounters:  03/17/18 161 lb (73 kg)  02/22/18 165 lb (74.8 kg)  12/30/17 164 lb (74.4 kg)   Body mass index is 25.99 kg/m.   Physical Exam    Constitutional: Appears well-developed and well-nourished. No distress.  HENT:  Head: Normocephalic and atraumatic.  Neck: Neck supple. No tracheal deviation present. No thyromegaly present.  No cervical lymphadenopathy Cardiovascular: Normal rate, regular rhythm and normal heart sounds.   2/6 systolic murmur heard. No carotid bruit .  No edema Pulmonary/Chest: Effort normal and breath sounds normal. No respiratory distress. No has no wheezes. No rales.  Msk: no tenderness in sacral region or lumbar spine Skin: Skin is warm and dry. Not diaphoretic.  Psychiatric:  Normal mood and affect. Behavior is normal.      Assessment & Plan:    See Problem List for Assessment and Plan of chronic medical problems.

## 2018-03-17 ENCOUNTER — Encounter: Payer: Self-pay | Admitting: Internal Medicine

## 2018-03-17 ENCOUNTER — Ambulatory Visit (INDEPENDENT_AMBULATORY_CARE_PROVIDER_SITE_OTHER)
Admission: RE | Admit: 2018-03-17 | Discharge: 2018-03-17 | Disposition: A | Discharge status: Home / Self Care | Payer: Medicare Other | Source: Ambulatory Visit | Attending: Internal Medicine | Admitting: Internal Medicine

## 2018-03-17 ENCOUNTER — Other Ambulatory Visit (INDEPENDENT_AMBULATORY_CARE_PROVIDER_SITE_OTHER): Payer: Medicare Other

## 2018-03-17 ENCOUNTER — Ambulatory Visit: Payer: Medicare Other | Admitting: Internal Medicine

## 2018-03-17 VITALS — BP 142/86 | HR 66 | Temp 98.3°F | Resp 16 | Ht 66.0 in | Wt 161.0 lb

## 2018-03-17 DIAGNOSIS — M545 Low back pain, unspecified: Secondary | ICD-10-CM

## 2018-03-17 DIAGNOSIS — I10 Essential (primary) hypertension: Secondary | ICD-10-CM | POA: Diagnosis not present

## 2018-03-17 DIAGNOSIS — E785 Hyperlipidemia, unspecified: Secondary | ICD-10-CM | POA: Diagnosis not present

## 2018-03-17 DIAGNOSIS — M47816 Spondylosis without myelopathy or radiculopathy, lumbar region: Secondary | ICD-10-CM | POA: Diagnosis not present

## 2018-03-17 DIAGNOSIS — R7303 Prediabetes: Secondary | ICD-10-CM

## 2018-03-17 DIAGNOSIS — J181 Lobar pneumonia, unspecified organism: Secondary | ICD-10-CM | POA: Diagnosis not present

## 2018-03-17 DIAGNOSIS — J189 Pneumonia, unspecified organism: Secondary | ICD-10-CM | POA: Diagnosis not present

## 2018-03-17 DIAGNOSIS — I480 Paroxysmal atrial fibrillation: Secondary | ICD-10-CM | POA: Diagnosis not present

## 2018-03-17 LAB — COMPREHENSIVE METABOLIC PANEL
ALT: 11 U/L (ref 0–35)
AST: 16 U/L (ref 0–37)
Albumin: 4.6 g/dL (ref 3.5–5.2)
Alkaline Phosphatase: 81 U/L (ref 39–117)
BUN: 19 mg/dL (ref 6–23)
CO2: 30 mEq/L (ref 19–32)
Calcium: 10.3 mg/dL (ref 8.4–10.5)
Chloride: 100 mEq/L (ref 96–112)
Creatinine, Ser: 1.07 mg/dL (ref 0.40–1.20)
GFR: 53.32 mL/min — ABNORMAL LOW (ref >60.00)
Glucose, Bld: 128 mg/dL — ABNORMAL HIGH (ref 70–99)
Potassium: 3.7 mEq/L (ref 3.5–5.1)
Sodium: 140 mEq/L (ref 135–145)
Total Bilirubin: 0.7 mg/dL (ref 0.2–1.2)
Total Protein: 7.8 g/dL (ref 6.0–8.3)

## 2018-03-17 LAB — LIPID PANEL
CHOLESTEROL: 212 mg/dL — AB (ref 0–200)
HDL: 71.9 mg/dL (ref >39.00)
LDL CALC: 122 mg/dL — AB (ref 0–99)
NONHDL: 140.03
Total CHOL/HDL Ratio: 3
Triglycerides: 90 mg/dL (ref 0.0–149.0)
VLDL: 18 mg/dL (ref 0.0–40.0)

## 2018-03-17 LAB — CBC WITH DIFFERENTIAL/PLATELET
Basophils Absolute: 0.1 10*3/uL (ref 0.0–0.1)
Basophils Relative: 1.8 % (ref 0.0–3.0)
Eosinophils Absolute: 0.1 10*3/uL (ref 0.0–0.7)
Eosinophils Relative: 1.6 % (ref 0.0–5.0)
HCT: 43.5 % (ref 36.0–46.0)
Hemoglobin: 14.9 g/dL (ref 12.0–15.0)
Lymphocytes Relative: 17.4 % (ref 12.0–46.0)
Lymphs Abs: 1.3 10*3/uL (ref 0.7–4.0)
MCHC: 34.1 g/dL (ref 30.0–36.0)
MCV: 90.3 fl (ref 78.0–100.0)
Monocytes Absolute: 0.6 10*3/uL (ref 0.1–1.0)
Monocytes Relative: 8.4 % (ref 3.0–12.0)
Neutro Abs: 5.3 10*3/uL (ref 1.4–7.7)
Neutrophils Relative %: 70.8 % (ref 43.0–77.0)
Platelets: 287 10*3/uL (ref 150.0–400.0)
RBC: 4.82 Mil/uL (ref 3.87–5.11)
RDW: 13.4 % (ref 11.5–15.5)
WBC: 7.4 10*3/uL (ref 4.0–10.5)

## 2018-03-17 LAB — CK: Total CK: 73 U/L (ref 7–177)

## 2018-03-17 LAB — TSH: TSH: 2.3 u[IU]/mL (ref 0.35–4.50)

## 2018-03-17 LAB — HEMOGLOBIN A1C: HEMOGLOBIN A1C: 6.2 % (ref 4.6–6.5)

## 2018-03-17 NOTE — Assessment & Plan Note (Addendum)
Check lipid panel  Stopped atorvastatin due to lower back/sacral pain  - hold for now Regular exercise and healthy diet encouraged

## 2018-03-17 NOTE — Assessment & Plan Note (Signed)
Asymptomatic In sinus rhythm on exam On cardizem, eliquis Cbc, cmp

## 2018-03-17 NOTE — Assessment & Plan Note (Signed)
Lower sacral/buttock pain with mild radiation to groin/hips No pain in legs or radiculpathy She feels it may be from the atorvastatin which she stopped a few days ago - slightly better Xray today Deferred referral - she will let me know if it does not improve - can refer to sports med

## 2018-03-17 NOTE — Assessment & Plan Note (Signed)
Slightly elevated here today She will start monitoring her BP at home Continue current medications Sees cardio in the next couple of months cmp

## 2018-03-17 NOTE — Assessment & Plan Note (Addendum)
Check a1c Low sugar / carb diet Stressed regular exercise   

## 2018-03-23 ENCOUNTER — Ambulatory Visit: Payer: Medicare Other | Admitting: Cardiology

## 2018-03-29 ENCOUNTER — Ambulatory Visit: Payer: Medicare Other | Admitting: Cardiology

## 2018-03-29 ENCOUNTER — Encounter: Payer: Self-pay | Admitting: Cardiology

## 2018-03-29 VITALS — BP 136/90 | HR 73 | Ht 66.0 in | Wt 164.0 lb

## 2018-03-29 DIAGNOSIS — I480 Paroxysmal atrial fibrillation: Secondary | ICD-10-CM | POA: Diagnosis not present

## 2018-03-29 DIAGNOSIS — E785 Hyperlipidemia, unspecified: Secondary | ICD-10-CM

## 2018-03-29 NOTE — Progress Notes (Signed)
Electrophysiology Office Note   Date:  03/29/2018   ID:  Sharon Knight, DOB 1945-12-06, MRN [id number]  PCP:  Sharon Rail, MD  Cardiologist:  Sharon Knight Primary Electrophysiologist:  Sharon Audia Meredith Leeds, MD    No chief complaint on file.    History of Present Illness: Sharon Knight is a 72 y.o. female who is being seen today for the evaluation of atrial fibrillation at the request of Sharon Knight. Presenting today for electrophysiology evaluation.  He has a history of right-sided nephrectomy, hypertension, and hyperlipidemia.  She was recently hospitalized for elevated white count and fever and was found to have a lobar pneumonia.  This is complicated by acute kidney injury and atrial fibrillation.  It was thought that her atrial fibrillation was a due to sepsis.  She was put on Eliquis for 3 weeks prior to outpatient cardioversion.  She did convert to sinus rhythm on her own.  Her main symptoms are weakness and fatigue.  She thinks that this may be due to being in the hospital.  She was discharged on August 15.  She has been weak and fatigued intermittently since then.  Today, denies symptoms of palpitations, chest pain, shortness of breath, orthopnea, PND, lower extremity edema, claudication, dizziness, presyncope, syncope, bleeding, or neurologic sequela. The patient is tolerating medications without difficulties.  Overall she is doing well.  She has no chest pain or shortness of breath.  She is mainly complaining of back pain.  She is noted no further episodes of atrial fibrillation.   Past Medical History:  Diagnosis Date  . Arthritis   . Bilateral cataracts    Dr Sharon Knight  . Hyperlipidemia   . Hypertension    Past Surgical History:  Procedure Laterality Date  . COLONOSCOPY     Dr Sharon Knight  . 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 OVARIAN CYST REMOVAL       Current Outpatient Medications  Medication Sig Dispense Refill  . acetaminophen (TYLENOL) 500 MG tablet Take  500 mg by mouth every 8 (eight) hours as needed for mild pain.    Marland Kitchen diltiazem (CARDIZEM CD) 180 MG 24 hr capsule Take 1 capsule (180 mg total) by mouth daily. 30 capsule 5  . Docusate Sodium (STOOL SOFTENER LAXATIVE PO) Take 3-4 capsules by mouth daily.    Marland Kitchen ELIQUIS 5 MG TABS tablet TAKE 1 TABLET BY MOUTH TWICE DAILY. 60 tablet 5  . flecainide (TAMBOCOR) 100 MG tablet Take 1 tablet (100 mg total) by mouth 2 (two) times daily. 60 tablet 3  . hydrochlorothiazide (HYDRODIURIL) 25 MG tablet TAKE (1/2) TABLET DAILY. 45 tablet 1  . Multiple Vitamin (MULTIVITAMIN) tablet Take 1 tablet by mouth daily.    Marland Kitchen atorvastatin (LIPITOR) 10 MG tablet TAKE 1 TABLET ONCE DAILY AT 6 PM. (Patient not taking: Reported on 03/17/2018) 90 tablet 1   No current facility-administered medications for this visit.     Allergies:   Ibuprofen   Social History:  The patient  reports that she quit smoking about 33 years ago. She has never used smokeless tobacco. She reports that she drinks about 3.0 standard drinks of alcohol per week. She reports that she does not use drugs.   Family History:  The patient's family history is not on file. She was adopted.    ROS:  Please see the history of present illness.   Otherwise, review of systems is positive for constipation, balance problems, muscle pain.   All other systems  are reviewed and negative.   PHYSICAL EXAM: VS:  BP 136/90   Pulse 73   Ht 5\' 6"  (1.676 m)   Wt 164 lb (74.4 kg)   BMI 26.47 kg/m  , BMI Body mass index is 26.47 kg/m. GEN: Well nourished, well developed, in no acute distress  HEENT: normal  Neck: no JVD, carotid bruits, or masses Cardiac: RRR; no murmurs, rubs, or gallops,no edema  Respiratory:  clear to auscultation bilaterally, normal work of breathing GI: soft, nontender, nondistended, + BS MS: no deformity or atrophy  Skin: warm and dry Neuro:  Strength and sensation are intact Psych: euthymic mood, full affect  EKG:  EKG is ordered  today. Personal review of the ekg ordered shows sinus rhythm, rate 73  Recent Labs: 12/16/2017: Magnesium 2.2 03/17/2018: ALT 11; BUN 19; Creatinine, Ser 1.07; Hemoglobin 14.9; Platelets 287.0; Potassium 3.7; Sodium 140; TSH 2.30    Lipid Panel     Component Value Date/Time   CHOL 212 (H) 03/17/2018 1032   CHOL 174 12/05/2012 1538   TRIG 90.0 03/17/2018 1032   TRIG 109 12/05/2012 1538   HDL 71.90 03/17/2018 1032   HDL 60 12/05/2012 1538   CHOLHDL 3 03/17/2018 1032   VLDL 18.0 03/17/2018 1032   LDLCALC 122 (H) 03/17/2018 1032   LDLCALC 92 12/05/2012 1538     Wt Readings from Last 3 Encounters:  03/29/18 164 lb (74.4 kg)  03/17/18 161 lb (73 kg)  02/22/18 165 lb (74.8 kg)      Other studies Reviewed: Additional studies/ records that were reviewed today include: TTE 12/11/17  Review of the above records today demonstrates:  - Left ventricle: The cavity size was normal. Wall thickness was   increased in a pattern of moderate LVH. Systolic function was   vigorous. The estimated ejection fraction was in the range of 65%   to 70%. Wall motion was normal; there were no regional wall   motion abnormalities. The study was not technically sufficient to   allow evaluation of LV diastolic dysfunction due to atrial   fibrillation. - Aortic valve: Valve area (VTI): 2.14 cm^2. Valve area (Vmax):   2.43 cm^2. Valve area (Vmean): 2.33 cm^2. - Mitral valve: Valve area by continuity equation (using LVOT   flow): 1.61 cm^2. - Atrial septum: No defect or patent foramen ovale was identified.   ASSESSMENT AND Knight:  1.  Paroxysmal atrial fibrillation: This rhythm today.  Was started on flecainide at her last visit.  She has noted no further episodes of atrial fibrillation or significant pauses.  We Sharon Knight make no changes to her flecainide at this time. 2.  Hypertension: Elevated today but has been normal in the past.  No changes.  3.  Hyperlipidemia: Continue Lipitor   Current medicines  are reviewed at length with the patient today.   The patient does not have concerns regarding her medicines.  The following changes were made today: None  Labs/ tests ordered today include:  Orders Placed This Encounter  Procedures  . EKG 12-Lead     Disposition:   FU with Sharon Knight 6 months  Signed, Sharon Knight Meredith Leeds, MD  03/29/2018 12:12 PM     [place] [id number] [phone] (office) [phone] (fax)

## 2018-03-29 NOTE — Patient Instructions (Signed)
Medication Instructions:  Your physician recommends that you continue on your current medications as directed. Please refer to the Current Medication list given to you today.  If you need a refill on your cardiac medications before your next appointment, please call your pharmacy.   Labwork: None ordered  Testing/Procedures: None ordered  Follow-Up: Your physician wants you to follow-up in: 6 months with Dr. Camnitz.  You will receive a reminder letter in the mail two months in advance. If you don't receive a letter, please call our office to schedule the follow-up appointment.  Thank you for choosing [facility]!!   Wilmer Santillo, RN [phone]         

## 2018-04-07 ENCOUNTER — Telehealth: Payer: Self-pay | Admitting: Internal Medicine

## 2018-04-07 MED ORDER — SIMVASTATIN 40 MG PO TABS: 40.0000 mg | ORAL_TABLET | Freq: Every evening | ORAL | 1 refills | Status: DC

## 2018-04-07 NOTE — Telephone Encounter (Signed)
Did ok with this in the past - not the cause of her pain - rx sent to pharmacy

## 2018-04-07 NOTE — Telephone Encounter (Signed)
Copied from [place] [phone]. Topic: Quick Communication - Rx Refill/Question >> Apr 07, 2018  1:25 PM Sharon Knight L wrote: Medication: simvastatin (ZOCOR) 40 MG tablet  pt was taken off due to body ache she wants to go back on it  Has the patient contacted their pharmacy? No. Medicine discontinued (Agent: If no, request that the patient contact the pharmacy for the refill.) (Agent: If yes, when and what did the pharmacy advise?)   Preferred Pharmacy (with phone number or street name):     [facility], [place]. [phone] (Phone) [phone] (Fax)    Agent: Please be advised that RX refills may take up to 3 business days. We ask that you follow-up with your pharmacy.

## 2018-04-12 ENCOUNTER — Telehealth: Payer: Self-pay

## 2018-04-12 MED ORDER — ATORVASTATIN CALCIUM 20 MG PO TABS: 20.0000 mg | ORAL_TABLET | Freq: Every day | ORAL | 3 refills | Status: DC

## 2018-04-12 NOTE — Telephone Encounter (Signed)
Pt returned call. Please call pt.

## 2018-04-12 NOTE — Telephone Encounter (Signed)
LVM for pt to call back in regards to medication changes needing to be made.

## 2018-04-12 NOTE — Telephone Encounter (Signed)
Pt states she will try either lipitor or crestor in place of simvastatin per pharmacy fax of not being able to take 40 mg of simvastatin with diltiazem.

## 2018-04-12 NOTE — Telephone Encounter (Signed)
Pt called back stating that she would like to take the lipitor

## 2018-04-12 NOTE — Telephone Encounter (Signed)
Please call patient back

## 2018-04-12 NOTE — Addendum Note (Signed)
Addended by: BURNS, STACY J on: 04/12/2018 08:10 PM   Modules accepted: Orders  

## 2018-04-13 ENCOUNTER — Telehealth: Payer: Self-pay | Admitting: Internal Medicine

## 2018-04-13 NOTE — Telephone Encounter (Signed)
Raquel Sarna, Pharmacy Technician at [facility] called to answer the question about Simvastatin and Atorvastatin. I advised the patient is no longer prescribed Simvastatin and Atorvastatin was sent on 04/12/18 #90/3 refills, she verbalized understanding.

## 2018-04-13 NOTE — Telephone Encounter (Signed)
Attempted to call patient to schedule AWV. Patient did not answer. Will try to call back at a later time. SF °

## 2018-04-13 NOTE — Telephone Encounter (Signed)
Copied from [place] [phone]. Topic: Quick Communication - See Telephone Encounter >> Apr 13, 2018 10:58 AM Cecelia Byars, NT wrote: CRM for notification. See Telephone encounter for: 04/13/18. The pharmacy called and said  they received a prescription for simizaatatin on 04/07/18 and today they received a script for  atorvastatin (LIPITOR) 20 MG tablet , they would like to know which to fill , please [place], [place] (Phone) [phone] (Fax)

## 2018-04-20 ENCOUNTER — Telehealth: Payer: Self-pay | Admitting: Cardiovascular Disease

## 2018-04-20 ENCOUNTER — Other Ambulatory Visit: Payer: Self-pay

## 2018-04-20 DIAGNOSIS — Z5181 Encounter for therapeutic drug level monitoring: Secondary | ICD-10-CM

## 2018-04-20 DIAGNOSIS — I10 Essential (primary) hypertension: Secondary | ICD-10-CM

## 2018-04-20 NOTE — Telephone Encounter (Signed)
Take hctz 25mg  instead of 12.5mg .  Check bpm and pharmD appt in 1 week.

## 2018-04-20 NOTE — Telephone Encounter (Signed)
° °  Pt c/o BP issue: STAT if pt c/o blurred vision, one-sided weakness or slurred speech  1. What are your last 5 BP readings? 172/84, 163/88, 175/88  2. Are you having any other symptoms (ex. Dizziness, headache, blurred vision, passed out)? NO  3. What is your BP issue?  Patient concerned about BP being too high

## 2018-04-20 NOTE — Telephone Encounter (Signed)
Returned call to patient husband stated she is not at home.[place].

## 2018-04-20 NOTE — Telephone Encounter (Signed)
Received call back from patient she stated her B/P has been elevated ranging 141/80,142/72,151/70,172/84,163/88,175/88.Pulse 66 to 83.Stated she is taking all medications and low salt diet.Advised I will send message to [place] for advice.

## 2018-04-21 NOTE — Telephone Encounter (Signed)
Advised patient, verbalized understanding. No available appointments with Pharm D next week but patient will get repeat labs and monitor her blood pressure at home. Will discuss with Pharm D to see when appointment can be made

## 2018-04-22 NOTE — Telephone Encounter (Addendum)
Patient would like to just have labs for now and monitor blood pressure at home

## 2018-04-22 NOTE — Telephone Encounter (Signed)
We have an appointment available Dec/24 at 10:30am

## 2018-04-24 ENCOUNTER — Other Ambulatory Visit: Payer: Self-pay | Admitting: Cardiology

## 2018-04-29 DIAGNOSIS — Z5181 Encounter for therapeutic drug level monitoring: Secondary | ICD-10-CM | POA: Diagnosis not present

## 2018-04-29 DIAGNOSIS — I10 Essential (primary) hypertension: Secondary | ICD-10-CM | POA: Diagnosis not present

## 2018-04-29 LAB — BASIC METABOLIC PANEL
BUN / CREAT RATIO: 19 (ref 12–28)
BUN: 18 mg/dL (ref 8–27)
CALCIUM: 10.4 mg/dL — AB (ref 8.7–10.3)
CO2: 27 mmol/L (ref 20–29)
CREATININE: 0.96 mg/dL (ref 0.57–1.00)
Chloride: 97 mmol/L (ref 96–106)
GFR calc Af Amer: 68 mL/min/{1.73_m2} (ref >59)
GFR calc non Af Amer: 59 mL/min/{1.73_m2} — ABNORMAL LOW (ref >59)
GLUCOSE: 73 mg/dL (ref 65–99)
Potassium: 4.1 mmol/L (ref 3.5–5.2)
Sodium: 139 mmol/L (ref 134–144)

## 2018-05-02 ENCOUNTER — Telehealth: Payer: Self-pay

## 2018-05-02 NOTE — Telephone Encounter (Signed)
Error

## 2018-05-10 DIAGNOSIS — Z961 Presence of intraocular lens: Secondary | ICD-10-CM | POA: Diagnosis not present

## 2018-05-10 DIAGNOSIS — H02054 Trichiasis without entropian left upper eyelid: Secondary | ICD-10-CM | POA: Diagnosis not present

## 2018-05-13 ENCOUNTER — Other Ambulatory Visit: Payer: Self-pay | Admitting: *Deleted

## 2018-05-13 MED ORDER — HYDROCHLOROTHIAZIDE 25 MG PO TABS: 25.0000 mg | ORAL_TABLET | Freq: Every day | ORAL | 3 refills | Status: DC

## 2018-05-15 NOTE — Progress Notes (Signed)
Subjective:    Patient ID: Sharon Knight, female    DOB: 1946/03/10, 73 y.o.   MRN: [id number]  HPI The patient is here for an acute visit.   Leg pain and pain all over:  Started around September.  She has pain in her right buttock and / or left buttock region.  She has upper leg pain depending on buttock pain - it is on both sides.  She denies calf pain.  She denies numbness/tingling pain.  She denies upper arm pain.  Position does not influence the pain.  Getting up from sitting/laying causes increased pain.  Tylenol may help a little, she takes it daily.    She thought this was the statin and at her last visit we agreed to hold it - there was no improvement and she did restart it.    Medications and allergies reviewed with patient and updated if appropriate.  Patient Active Problem List   Diagnosis Date Noted  . Bilateral low back pain 03/17/2018  . Gout 12/21/2017  . Lobar pneumonia ([place]) 12/10/2017  . Paroxysmal atrial fibrillation ([place]) 12/10/2017  . AKI (acute kidney injury) ([place]) 12/10/2017  . Prediabetes 02/05/2016  . History of colonic polyps 10/22/2014  . 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 Vitamin D deficiency 10/31/2012  . ECZEMA 03/03/2010  . 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 Hyperlipidemia 08/02/2007  . Essential hypertension 08/02/2007  . Osteopenia 08/02/2007    Current Outpatient Medications on File Prior to Visit  Medication Sig Dispense Refill  . acetaminophen (TYLENOL) 500 MG tablet Take 500 mg by mouth every 8 (eight) hours as needed for mild pain.    Marland Kitchen atorvastatin (LIPITOR) 20 MG tablet Take 1 tablet (20 mg total) by mouth daily. 90 tablet 3  . diltiazem (CARDIZEM CD) 180 MG 24 hr capsule Take 1 capsule (180 mg total) by mouth daily. 30 capsule 5  . Docusate Sodium (STOOL SOFTENER LAXATIVE PO) Take 3-4 capsules by mouth daily.    Marland Kitchen ELIQUIS 5 MG TABS tablet TAKE 1 TABLET BY MOUTH TWICE DAILY. 60 tablet 5  . flecainide (TAMBOCOR) 100  MG tablet TAKE 1 TABLET BY MOUTH TWICE DAILY. 60 tablet 10  . hydrochlorothiazide (HYDRODIURIL) 25 MG tablet Take 1 tablet (25 mg total) by mouth daily. 90 tablet 3  . Multiple Vitamin (MULTIVITAMIN) tablet Take 1 tablet by mouth daily.     No current facility-administered medications on file prior to visit.     Past Medical History:  Diagnosis Date  . Arthritis   . Bilateral cataracts    Dr Kathrin Penner  . Hyperlipidemia   . Hypertension     Past Surgical History:  Procedure Laterality Date  . COLONOSCOPY     Dr Fuller Plan  . 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 OVARIAN CYST REMOVAL      Social History   Socioeconomic History  . Marital status: Married    Spouse name: Not on file  . Number of children: Not on file  . Years of education: Not on file  . Highest education level: Not on file  Occupational History  . Not on file  Social Needs  . Financial resource strain: Not on file  . Food insecurity:    Worry: Not on file    Inability: Not on file  . Transportation needs:    Medical: Not on file    Non-medical: Not on file  Tobacco Use  . Smoking status: Former  Smoker    Last attempt to quit: 08/27/1984    Years since quitting: 33.7  . Smokeless tobacco: Never Used  . Tobacco comment: smoked age [age], up to 1.5-2 ppd  Substance and Sexual Activity  . Alcohol use: Yes    Alcohol/week: 3.0 standard drinks    Types: 3 Cans of beer per week    Comment: 3 x weekly  . Drug use: No  . Sexual activity: Not on file  Lifestyle  . Physical activity:    Days per week: Patient refused    Minutes per session: Patient refused  . Stress: Not on file  Relationships  . Social connections:    Talks on phone: Not on file    Gets together: Not on file    Attends religious service: Not on file    Active member of club or organization: Not on file    Attends meetings of clubs or organizations: Not on file    Relationship status: Not on file  Other Topics Concern  . Not  on file  Social History Narrative   Exercise: none    Family History  Adopted: Yes  Problem Relation Age of Onset  . Colon cancer Neg Hx   . Stomach cancer Neg Hx     Review of Systems  Constitutional: Negative for chills and fever.  Musculoskeletal: Positive for arthralgias (b/l knees) and back pain.  Neurological: Positive for weakness (related to pain in back and legs). Negative for numbness.       Objective:   Vitals:   05/16/18 1458  BP: 140/72  Pulse: 76  Resp: 16  Temp: 99.4 F (37.4 C)  SpO2: 98%   BP Readings from Last 3 Encounters:  05/16/18 140/72  03/29/18 136/90  03/17/18 (!) 142/86   Wt Readings from Last 3 Encounters:  05/16/18 167 lb (75.8 kg)  03/29/18 164 lb (74.4 kg)  03/17/18 161 lb (73 kg)   Body mass index is 26.95 kg/m.   Physical Exam Constitutional:      General: She is not in acute distress.    Appearance: Normal appearance. She is not ill-appearing.  HENT:     Head: Normocephalic and atraumatic.  Musculoskeletal:        General: No tenderness (across lower back with palpation) or deformity (in lower back).  Skin:    General: Skin is warm and dry.  Neurological:     Mental Status: She is alert.     Sensory: No sensory deficit.     Motor: Weakness (legs - related to knee OA and unable to get up out of chair) present.            Assessment & Plan:    See Problem List for Assessment and Plan of chronic medical problems.

## 2018-05-16 ENCOUNTER — Encounter: Payer: Self-pay | Admitting: Internal Medicine

## 2018-05-16 ENCOUNTER — Ambulatory Visit (INDEPENDENT_AMBULATORY_CARE_PROVIDER_SITE_OTHER): Payer: Medicare Other | Admitting: Internal Medicine

## 2018-05-16 VITALS — BP 140/72 | HR 76 | Temp 99.4°F | Resp 16 | Ht 66.0 in | Wt 167.0 lb

## 2018-05-16 DIAGNOSIS — M545 Low back pain, unspecified: Secondary | ICD-10-CM

## 2018-05-16 NOTE — Assessment & Plan Note (Signed)
No improvement with stopping the statin Likely musculoskeletal with some radiculopathy Referred to PT Referred to Ortho - may need additional imaging/evaluation

## 2018-05-16 NOTE — Patient Instructions (Signed)
  A referral was ordered for physical therapy and orthopedics.  Someone will call you to schedule these appointments.

## 2018-05-24 ENCOUNTER — Ambulatory Visit: Payer: Medicare Other | Attending: Internal Medicine | Admitting: Physical Therapy

## 2018-05-24 ENCOUNTER — Other Ambulatory Visit: Payer: Self-pay

## 2018-05-24 ENCOUNTER — Encounter: Payer: Self-pay | Admitting: Physical Therapy

## 2018-05-24 DIAGNOSIS — M6281 Muscle weakness (generalized): Secondary | ICD-10-CM | POA: Diagnosis not present

## 2018-05-24 DIAGNOSIS — M79605 Pain in left leg: Secondary | ICD-10-CM | POA: Insufficient documentation

## 2018-05-24 DIAGNOSIS — M5416 Radiculopathy, lumbar region: Secondary | ICD-10-CM

## 2018-05-24 DIAGNOSIS — M79604 Pain in right leg: Secondary | ICD-10-CM | POA: Diagnosis not present

## 2018-05-24 NOTE — Therapy (Signed)
[place], [place], [address] Phone: [phone]   Fax:  [phone]  Physical Therapy Evaluation  Patient Details  Name: Sharon Knight MRN: [id number] Date of Birth: 1945/07/19 Referring Provider (PT): Dr. Celso Amy    Encounter Date: 05/24/2018  PT End of Session - 05/24/18 1251    Visit Number  1    Number of Visits  16    Date for PT Re-Evaluation  07/22/18    PT Start Time  0932    PT Stop Time  1240    PT Time Calculation (min)  55 min    Activity Tolerance  Patient tolerated treatment well    Behavior During Therapy  [facility] for tasks assessed/performed       Past Medical History:  Diagnosis Date  . Arthritis   . Bilateral cataracts    Dr Kathrin Penner  . Hyperlipidemia   . Hypertension     Past Surgical History:  Procedure Laterality Date  . COLONOSCOPY     Dr Fuller Plan  . 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 OVARIAN CYST REMOVAL      There were no vitals filed for this visit.   Subjective Assessment - 05/24/18 1149    Subjective  Was in hospital (Sept 2019) due to cardiac issues.  She thought it might be from a statin she was taking.  No difference with stopping meds.  She presents with bilateral LE pain which has been ongoing since the hospitalization.   She also has knee pain and wears compression sleeves, chronic and intermittent. She has pain across bilateral buttocks (min to none in Lumbar spine) and into bilateral thighs anterior and posteriorly.  Denies sensory changes, legs feel weak with standing long periods (knees?).  She is limited mostly in her ability to stand from lower surfaces, walk upright and continue to maintain her home.  She is active nbut does not exercise.     Pertinent History  pneumonia ,atrial fibrillation, gout     Limitations  Sitting;Standing;Walking;Other (comment);House hold activities;Lifting    How long can you sit comfortably?  depends on the seat      How long can you stand comfortably?  15 min     How long can you walk comfortably?  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  Yes    Pain Score  4     Pain Location  Back   buttocks and thighs    Pain Orientation  Right;Left;Lower    Pain Descriptors / Indicators  Tightness;Aching   catching    Pain Type  Chronic pain    Pain Radiating Towards  anterior thighs, buttocks     Pain Onset  More than a month ago    Pain Frequency  Intermittent    Aggravating Factors   standing too long , overactivity , laying on her side     Pain Relieving Factors  change positions , Tylenol     Effect of Pain on Daily Activities  has to do all the cleaning and cares for MIL, pain afterwards          [facility] PT Assessment - 05/24/18 0001      Assessment   Medical  Diagnosis  chronic low back pain     Referring Provider (PT)  Dr. Celso Amy     Onset Date/Surgical Date  --   Aug 2019    Hand Dominance  Right    Next MD Visit  upcoming     Prior Therapy  no       Precautions   Precautions  None    Precaution Comments  on blood thinner, A-fib       Restrictions   Weight Bearing Restrictions  No      Balance Screen   Has the patient fallen in the past 6 months  No    Has the patient had a decrease in activity level because of a fear of falling?   Yes    Is the patient reluctant to leave their home because of a fear of falling?   No      Home Film/video editor residence    Living Arrangements  Spouse/significant other    Type of [employer] to enter    Entrance Stairs-Number of Steps  [place]  One level    [place] - single point    Additional Comments  husband is I but he needs  supervision for safety, had a SDH       Prior Function   Level of Independence  Independent    Vocation  Retired    Biomedical scientist  patient manages the house, does all housework       Cognition   Overall Cognitive Status  Within Functional Limits for tasks assessed    Behaviors  --   cues to stay on track, verbose      Observation/Other Assessments   Focus on Therapeutic Outcomes (FOTO)   56%      Sensation   Light Touch  Appears Intact      Coordination   Gross Motor Movements are Fluid and Coordinated  Not tested      Posture/Postural Control   Posture/Postural Control  Postural limitations    Postural Limitations  Rounded Shoulders;Forward head;Increased thoracic kyphosis;Flexed trunk      AROM   Lumbar Flexion  touches floor     Lumbar Extension  about 20 deg beyond neutral    leg pain    Lumbar - Right Side Bend  pain , limited 50%     Lumbar - Left Side Bend  stretch on R , 25%     Lumbar - Right Rotation  stiff    Lumbar - Left Rotation  pain       PROM   Overall PROM Comments  tight posterior knee      Strength   Right Hip Flexion  3-/5    Right Hip ABduction  3+/5    Left Hip Flexion  3-/5    Left Hip ABduction  3+/5    Right/Left Knee  --   pain in knees    Right Knee Flexion  5/5    Right Knee Extension  4/5    Left Knee Flexion  5/5    Left Knee Extension  4+/5    Right Ankle Dorsiflexion  4/5    Left Ankle Dorsiflexion  4/5      Flexibility   Hamstrings  WFL      Palpation   Palpation comment  no pain with palpation to  lumbar spine, min TTP along bilateral glutes       Transfers   Transfers  Sit to Stand;Supine to Sit;Sit to Supine    Sit to Stand  6: Modified independent (Device/Increase time)    Five time sit to stand comments   needs UE support     Supine to Sit  6: Modified independent (Device/Increase time)    Sit to Supine  6: Modified independent (Device/Increase time)   poor technique               Objective  measurements completed on examination: See above findings.      [facility] Adult PT Treatment/Exercise - 05/24/18 0001      Self-Care   Self-Care  Posture;Other Self-Care Comments    Posture  standing     Other Self-Care Comments   HEP, POC       Lumbar Exercises: Stretches   Active Hamstring Stretch  2 reps    Lower Trunk Rotation  10 seconds    Lower Trunk Rotation Limitations  x 10     Prone on Elbows Stretch Limitations  not painful       Lumbar Exercises: Supine   Bridge  5 reps             PT Education - 05/24/18 1251    Education Details  PT/POC, radiculopathy, HEP, sit to stand, handout, posture     Person(s) Educated  Patient    Methods  Explanation;Verbal cues;Handout    Comprehension  Verbalized understanding;Returned demonstration       PT Short Term Goals - 05/24/18 1311      PT SHORT TERM GOAL #1   Title  Pt will be I with HEP for trunk, LE strength    Time  4    Period  Weeks    Status  New    Target Date  06/24/18      PT SHORT TERM GOAL #2   Title  Pt will be able to stand from bed in the night with less pain overall improved 25%    Time  4    Period  Weeks    Status  New    Target Date  06/24/18      PT SHORT TERM GOAL #3   Title  Pt will be able to sit to stand without UE assist from standard chair x 5    Time  4    Period  Weeks    Status  New    Target Date  06/24/18      PT SHORT TERM GOAL #4   Title  Pt will complete balance screen and set goal if appropriate    Time  4    Period  Weeks    Status  New    Target Date  06/24/18        PT Long Term Goals - 05/24/18 1316      PT LONG TERM GOAL #1   Title  Pt will be I with HEP as of last visit, strengthening and posture.     Time  8    Period  Weeks    Status  New    Target Date  07/29/18      PT LONG TERM GOAL #2   Title  Pt will be able to shop , run errands for 30 min at a time with pain in legs minimal (less than 3/10)    Time  8    Period  Weeks  Status  New     Target Date  07/29/18      PT LONG TERM GOAL #3   Title  Pt will understand and demo safe body mechanics for lifting, transfers    Time  8    Period  Weeks    Status  New    Target Date  07/29/18      PT LONG TERM GOAL #4   Title  Pt will be able to improve sit to stand and balance testing (TBA)     Time  8    Period  Weeks    Status  New    Target Date  07/29/18      PT LONG TERM GOAL #5   Title  Pt will be able to demo increased hip strength to 4/5 or better in all planes     Baseline  3-/5 to 3+/5    Time  8    Period  Weeks    Status  New    Target Date  07/29/18             Plan - 05/24/18 1252    Clinical Impression Statement  Patient presents for mod complexity eval of low back pain/radiculopathy affecting bilateral legs.  She has significant weakness in hips and difficulty transitioning from supine to sit, rolling, sit to stand, car transfers.  She is unable to stand with neutral trunk due to pain in legs.  She has a cane but does not use. She will benefit from PT to improve her ability to sit, sleep, rest comfortably.  She has significant difficulty getting out of bed in the middle of the night which poses a fall risk.      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    Clinical Presentation  Evolving    Clinical Presentation due to:  worsening symptoms since hospitalization     Clinical Decision Making  Moderate    Rehab Potential  Good    PT Frequency  2x / week    PT Duration  8 weeks    PT Treatment/Interventions  ADLs/Self Care Home Management;Cryotherapy;Ultrasound;Balance training;Orthotic Fit/Training;Moist Heat;Traction;Gait training;Electrical Stimulation;DME Instruction;Therapeutic exercise;Therapeutic activities;Functional mobility training;Neuromuscular re-education;Patient/family education;Manual techniques;Passive range of motion;Taping    PT Next Visit Plan  check HEP,  posture/body mechanics , try NuStep, can lie prone, hip strength     PT Home Exercise Plan  LTR, hamstring stretch, sit to stand, body mechanics, seated marching, bridging    Consulted and Agree with Plan of Care  Patient       Patient will benefit from skilled therapeutic intervention in order to improve the following deficits and impairments:  Abnormal gait, Decreased balance, Decreased endurance, Decreased mobility, Difficulty walking, Hypomobility, Other (comment), Obesity, Improper body mechanics, Pain, Postural dysfunction, Impaired UE functional use, Impaired flexibility, Increased fascial restricitons, Decreased strength, Decreased activity tolerance, Decreased range of motion  Visit Diagnosis: Radiculopathy, lumbar region  Muscle weakness (generalized)  Pain in left leg  Pain in right leg     Problem List Patient Active Problem List   Diagnosis Date Noted  . Bilateral low back pain 03/17/2018  . Gout 12/21/2017  . Lobar pneumonia ([place]) 12/10/2017  . Paroxysmal atrial fibrillation ([place]) 12/10/2017  . AKI (acute kidney injury) ([place]) 12/10/2017  . Prediabetes 02/05/2016  . History of colonic polyps 10/22/2014  . 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 Vitamin D deficiency 10/31/2012  . ECZEMA  03/03/2010  . 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 Hyperlipidemia 08/02/2007  . Essential hypertension 08/02/2007  . Osteopenia 08/02/2007    PAA,JENNIFER 05/24/2018, 1:24 PM  [facility] [address] [place], [place], [address] Phone: [phone]   Fax:  [phone]  Name: Sharon Knight MRN: [id number] Date of Birth: 04-18-1946  Raeford Razor, PT 05/24/18 1:24 PM Phone: [phone] Fax: [phone]

## 2018-05-24 NOTE — Patient Instructions (Signed)
LOWER TRUNK ROTATION  Setup Begin lying on your back with your knees bent and feet resting on the floor. Movement Keeping your back flat, slowly rotate your knees down towards the floor until you feel a stretch in your trunk and hold. Tip Make sure that your back and shoulders stay in contact with the floor. STEP 1 STEP 2 Supine Hamstring Stretch with Strap REPS: 3  SETS: 1  HOLD: 30  DAILY: 1  WEEKLY: 7 Setup Begin lying on your back with your legs straight, holding the end of a strap that is looped around one foot. Movement Use the strap to pull your leg up toward your body until you feel a gentle stretch in the back of your upper leg. Hold this position. Tip Make sure to keep your other leg straight on the ground during the stretch. STEP 1 STEP 2 Supine Bridge REPS: 10  SETS: 2  HOLD: 5  DAILY: 1  WEEKLY: 7 Setup Begin lying on your back with your arms resting at your sides, your legs bent at the knees and your feet flat on the ground. Movement Tighten your abdominals and slowly lift your hips off the floor into a bridge position, keeping your back straight. Tip Make sure to keep your trunk stiff throughout the exercise and your arms flat on the floor. STEP 1 STEP 2 STEP 3 Seated March REPS: 10  SETS: 2  DAILY: 1  WEEKLY: 7 Setup Begin sitting upright in a chair with your feet flat on the floor. Movement Keeping your knee bent, lift one leg then lower it back to the ground and repeat with your other leg. Continue this movement, alternating between each leg. Tip Make sure to keep your back straight and do not let it arch as you lift your legs. STEP 1 STEP 2 STEP 3 Proper Sit to Stand Technique REPS: 10  SETS: 2  DAILY: 1  WEEKLY: 7 Setup Setup Directions Movement Begin sitting upright in a chair with armrests with your feet flat on the floor. Tip Place your hands on the armrests and scoot to the edge of the chair. With your feet tucked underneath  you, lean your torso forward so your head is over your toes, then press into your feet and hands to push up into a standing position. Prepared By: [place], [place] Phone: [phone] Login URL: [email or web address] . Access Code: [email or web address] . Date printed: 05/24/2018 Page 1 Disclaimer:

## 2018-05-26 ENCOUNTER — Ambulatory Visit: Payer: Medicare Other | Admitting: Cardiovascular Disease

## 2018-05-26 ENCOUNTER — Encounter: Payer: Self-pay | Admitting: Cardiovascular Disease

## 2018-05-26 VITALS — BP 142/78 | HR 65 | Ht 66.0 in | Wt 165.8 lb

## 2018-05-26 DIAGNOSIS — I480 Paroxysmal atrial fibrillation: Secondary | ICD-10-CM | POA: Diagnosis not present

## 2018-05-26 DIAGNOSIS — E785 Hyperlipidemia, unspecified: Secondary | ICD-10-CM

## 2018-05-26 DIAGNOSIS — Z5181 Encounter for therapeutic drug level monitoring: Secondary | ICD-10-CM

## 2018-05-26 DIAGNOSIS — I10 Essential (primary) hypertension: Secondary | ICD-10-CM

## 2018-05-26 MED ORDER — VALSARTAN 80 MG PO TABS: 80.0000 mg | ORAL_TABLET | Freq: Every day | ORAL | 1 refills | Status: DC

## 2018-05-26 NOTE — Progress Notes (Signed)
Cardiology Office Note   Date:  05/26/2018   ID:  Sharon Knight, DOB 1946-01-31, MRN [id number]  PCP:  Binnie Rail, MD  Cardiologist:   Skeet Latch, MD   No chief complaint on file.    History of Present Illness: Sharon Knight is a 73 y.o. female with paroxysmal atrial fibrillation, hypertension, hyperlipidemia and right nephrectomywho presents for follow up.  She was admitted 12/2017 with atrial fibrillation with RVR in the setting of sepsis and pneumonia.  She was started on Eliquis and her home dose of metoprolol was increased.  Echocardiogram 12/11/2017 revealed LVEF 65 to 70% with moderate LVH.  Her left atrium was not dilated.  She followed up with Almyra Deforest, PA on 12/22/17 and was back in sinus rhythm.  She had a 30-day event monitor that did not reveal recurrent atrial fibrillation.  She followed up with Dr. Curt Bears 12/30/2017 and was still in atrial fibrillation at that time.  She was started on flecainide and metoprolol was discontinued.  She continued to wear her monitor and had multiple episodes of pauses overnight.  She was seen by Dr. Curt Bears in the ED 01/06/2018 and her evening dose of metoprolol was discontinued.  Given that her pauses occurred only overnight there were no plans for pacemaker implantation at that time.  At her last appointment Sharon Knight was dizzy and bradycardic so metoprolol was discontinued.  Since then her BP was poorly controlled and HCTZ was increased to 25mg .  Her BP at home has been in the 140-150s.  She has been feelign well.  She has no chest pain or shortness of breath. She has been going to PT for her knees, which has helped.  She also has arthritis in her spine.  She has no lower extremity edema, orthopnea or PND.    She has been struggling with nocturia. She denies any episodes of atrial fibrillation.   Past Medical History:  Diagnosis Date  . Arthritis   . Bilateral cataracts    Dr Kathrin Penner  . Hyperlipidemia   . Hypertension     Past  Surgical History:  Procedure Laterality Date  . COLONOSCOPY     Dr Fuller Plan  . 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 OVARIAN CYST REMOVAL       Current Outpatient Medications  Medication Sig Dispense Refill  . acetaminophen (TYLENOL) 500 MG tablet Take 500 mg by mouth every 8 (eight) hours as needed for mild pain.    Marland Kitchen atorvastatin (LIPITOR) 20 MG tablet Take 1 tablet (20 mg total) by mouth daily. 90 tablet 3  . diltiazem (CARDIZEM CD) 180 MG 24 hr capsule Take 1 capsule (180 mg total) by mouth daily. 30 capsule 5  . Docusate Sodium (STOOL SOFTENER LAXATIVE PO) Take 3-4 capsules by mouth daily.    Marland Kitchen ELIQUIS 5 MG TABS tablet TAKE 1 TABLET BY MOUTH TWICE DAILY. 60 tablet 5  . flecainide (TAMBOCOR) 100 MG tablet TAKE 1 TABLET BY MOUTH TWICE DAILY. 60 tablet 10  . hydrochlorothiazide (HYDRODIURIL) 25 MG tablet Take 1 tablet (25 mg total) by mouth daily. 90 tablet 3  . Multiple Vitamin (MULTIVITAMIN) tablet Take 1 tablet by mouth daily.    . valsartan (DIOVAN) 80 MG tablet Take 1 tablet (80 mg total) by mouth daily. 90 tablet 1   No current facility-administered medications for this visit.     Allergies:   Ibuprofen    Social History:  The patient  reports  that she quit smoking about 33 years ago. She has never used smokeless tobacco. She reports current alcohol use of about 3.0 standard drinks of alcohol per week. She reports that she does not use drugs.   Family History:  The patient's family history is not on file. She was adopted.    ROS:  Please see the history of present illness.   Otherwise, review of systems are positive for none.   All other systems are reviewed and negative.    PHYSICAL EXAM: VS:  BP (!) 142/78   Pulse 65   Ht 5\' 6"  (1.676 m)   Wt 165 lb 12.8 oz (75.2 kg)   BMI 26.76 kg/m  , BMI Body mass index is 26.76 kg/m. GENERAL:  Well appearing HEENT: Pupils equal round and reactive, fundi not visualized, oral mucosa unremarkable NECK:  No jugular venous  distention, waveform within normal limits, carotid upstroke brisk and symmetric, no bruits LUNGS:  Clear to auscultation bilaterally HEART:  RRR.  PMI not displaced or sustained,S1 and S2 within normal limits, no S3, no S4, no clicks, no rubs, no murmurs ABD:  Flat, positive bowel sounds normal in frequency in pitch, no bruits, no rebound, no guarding, no midline pulsatile mass, no hepatomegaly, no splenomegaly EXT:  2 plus pulses throughout, no edema, no cyanosis no clubbing SKIN:  No rashes no nodules NEURO:  Cranial nerves II through XII grossly intact, motor grossly intact throughout PSYCH:  Cognitively intact, oriented to person place and time   EKG:  EKG is not ordered today.   ETT 01/25/18: 4.6 METS on Bruce protocol.  No ischemia.  Echocardiogram 12/11/2017 Study Conclusions - Left ventricle: The cavity size was normal. Wall thickness was increased in a pattern of moderate LVH. Systolic function was vigorous. The estimated ejection fraction was in the range of 65% to 70%. Wall motion was normal; there were no regional wall motion abnormalities. The study was not technically sufficient to allow evaluation of LV diastolic dysfunction due to atrial fibrillation. - Aortic valve: Valve area (VTI): 2.14 cm^2. Valve area (Vmax): 2.43 cm^2. Valve area (Vmean): 2.33 cm^2. - Mitral valve: Valve area by continuity equation (using LVOT flow): 1.61 cm^2. - Atrial septum: No defect or patent foramen ovale was identified  30 Day Event Monitor 12/23/17:   Quality: Fair.  Baseline artifact. Predominant rhythm: sinus rhythm Average heart rate: 68 bpm Max heart rate: 174 bpm Min heart rate: 38 bpm 199 pauses >3 seconds Pauses noted up to 6 seconds. All occurred over night or early morning. PVCs Atrial flutter 16% (71% controlled, <1% slow, 29% rapid)   Recent Labs: 12/16/2017: Magnesium 2.2 03/17/2018: ALT 11; Hemoglobin 14.9; Platelets 287.0; TSH 2.30 04/29/2018: BUN  18; Creatinine, Ser 0.96; Potassium 4.1; Sodium 139    Lipid Panel    Component Value Date/Time   CHOL 212 (H) 03/17/2018 1032   CHOL 174 12/05/2012 1538   TRIG 90.0 03/17/2018 1032   TRIG 109 12/05/2012 1538   HDL 71.90 03/17/2018 1032   HDL 60 12/05/2012 1538   CHOLHDL 3 03/17/2018 1032   VLDL 18.0 03/17/2018 1032   LDLCALC 122 (H) 03/17/2018 1032   LDLCALC 92 12/05/2012 1538      Wt Readings from Last 3 Encounters:  05/26/18 165 lb 12.8 oz (75.2 kg)  05/16/18 167 lb (75.8 kg)  03/29/18 164 lb (74.4 kg)      ASSESSMENT AND PLAN:  # Paroxysmal atrial flutter: # Sick sinus syndrome: HR better since stopping metoprolol.  Continue diltiazem and flecainide.  Continue Eliquis.    # Hypertension:  BP has been elevated at home and is high today.  We will add valsartan 80mg  daily.  Check BMP in 1 week.  Continue diltiazem and HCTZ.   # Hyperlipidemia: Continue atorvastatin.    Current medicines are reviewed at length with the patient today.  The patient does not have concerns regarding medicines.  The following changes have been made: Add valsartan.   Labs/ tests ordered today include:   Orders Placed This Encounter  Procedures  . Basic metabolic panel     Disposition:   FU with Sharon Lisowski C. Oval Linsey, MD, [facility] in 4 months.  PharmD in 1 month.    Signed, Sharon Shilling C. Oval Linsey, MD, [facility]  05/26/2018 2:40 PM    [facility]

## 2018-05-26 NOTE — Patient Instructions (Signed)
Medication Instructions:  START VALSARTAN 80 MG DAILY   If you need a refill on your cardiac medications before your next appointment, please call your pharmacy.   Lab work: BMET IN 1 WEEK   If you have labs (blood work) drawn today and your tests are completely normal, you will receive your results only by: Marland Kitchen MyChart Message (if you have MyChart) OR . A paper copy in the mail If you have any lab test that is abnormal or we need to change your treatment, we will call you to review the results.  Testing/Procedures: NONE  Follow-Up: At [facility], you and your health needs are our priority.  As part of our continuing mission to provide you with exceptional heart care, we have created designated Provider Care Teams.  These Care Teams include your primary Cardiologist (physician) and Advanced Practice Providers (APPs -  Physician Assistants and Nurse Practitioners) who all work together to provide you with the care you need, when you need it. You will need a follow up appointment in 4 months.  Please call our office 2 months in advance to schedule this appointment.  You may see Skeet Latch, MD or one of the following Advanced Practice Providers on your designated Care Team:   Kerin Ransom, PA-C Roby Lofts, [place] . Sande Rives, PA-C  Your physician recommends that you schedule a follow-up appointment in: [place]

## 2018-05-31 ENCOUNTER — Ambulatory Visit: Payer: Medicare Other | Admitting: Physical Therapy

## 2018-05-31 ENCOUNTER — Encounter: Payer: Self-pay | Admitting: Physical Therapy

## 2018-05-31 DIAGNOSIS — M6281 Muscle weakness (generalized): Secondary | ICD-10-CM | POA: Diagnosis not present

## 2018-05-31 DIAGNOSIS — M5416 Radiculopathy, lumbar region: Secondary | ICD-10-CM | POA: Diagnosis not present

## 2018-05-31 DIAGNOSIS — M79605 Pain in left leg: Secondary | ICD-10-CM | POA: Diagnosis not present

## 2018-05-31 DIAGNOSIS — M79604 Pain in right leg: Secondary | ICD-10-CM

## 2018-05-31 NOTE — Patient Instructions (Signed)
Hip Flexor Stretch    Lying on back near edge of bed, bend one leg, foot flat. Hang other leg over edge, relaxed, thigh resting entirely on bed for _1-3 (start with 30 sec) ___ minutes. Repeat _1___ times. Do ___2_ sessions per day. Advanced Exercise: Bend knee back keeping thigh in contact with bed.  http://gt2.exer.us/347   Copyright  VHI. All rights reserved.

## 2018-05-31 NOTE — Therapy (Signed)
[place], [place], [address] Phone: [phone]   Fax:  [phone]  Physical Therapy Treatment  Patient Details  Name: Sharon Knight MRN: [id number] Date of Birth: 1946-02-13 Referring Provider (PT): Dr. Celso Amy    Encounter Date: 05/31/2018  PT End of Session - 05/31/18 1427    Visit Number  2    Number of Visits  16    Date for PT Re-Evaluation  07/22/18    PT Start Time  1419    PT Stop Time  1500    PT Time Calculation (min)  41 min    Activity Tolerance  Patient tolerated treatment well    Behavior During Therapy  [facility] for tasks assessed/performed       Past Medical History:  Diagnosis Date  . Arthritis   . Bilateral cataracts    Dr Kathrin Penner  . Hyperlipidemia   . Hypertension     Past Surgical History:  Procedure Laterality Date  . COLONOSCOPY     Dr Fuller Plan  . 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 OVARIAN CYST REMOVAL      There were no vitals filed for this visit.  Subjective Assessment - 05/31/18 1421    Subjective  Sometimes does her stretches in the middle of the night and it helps.  Legs still ache , hard to stand up.  Pt felt good after the eval.     Currently in Pain?  No/denies   none at rest            [facility] Adult PT Treatment/Exercise - 05/31/18 0001      Lumbar Exercises: Stretches   Active Hamstring Stretch  2 reps;30 seconds    Active Hamstring Stretch Limitations  cues for     Lower Trunk Rotation  10 seconds    Lower Trunk Rotation Limitations  x 10     Hip Flexor Stretch  1 rep;60 seconds    Hip Flexor Stretch Limitations  tight!      Lumbar Exercises: Aerobic   Nustep  5 min L 5 UE and LE       Lumbar Exercises: Standing   Other Standing Lumbar Exercises  anterior hip stretching, knee discomfort       Lumbar Exercises: Seated   Sit to Stand  10 reps    Other Seated Lumbar Exercises  30 sec sit to stand 9 reps in 30 sec     Other  Seated Lumbar Exercises  march with band red band     x 10      Lumbar Exercises: Supine   Bridge  10 reps   2 sets    Bridge Limitations  used yoga block for ant hip stretching              PT Education - 05/31/18 1457    Education Details  HEP  corrections and posture     Person(s) Educated  Patient    Methods  Explanation    Comprehension  Verbalized understanding;Returned demonstration       PT Short Term Goals - 05/24/18 1311      PT SHORT TERM GOAL #1   Title  Pt will be I with HEP for trunk, LE strength    Time  4    Period  Weeks    Status  New    Target Date  06/24/18      PT SHORT TERM GOAL #2  Title  Pt will be able to stand from bed in the night with less pain overall improved 25%    Time  4    Period  Weeks    Status  New    Target Date  06/24/18      PT SHORT TERM GOAL #3   Title  Pt will be able to sit to stand without UE assist from standard chair x 5    Time  4    Period  Weeks    Status  New    Target Date  06/24/18      PT SHORT TERM GOAL #4   Title  Pt will complete balance screen and set goal if appropriate    Time  4    Period  Weeks    Status  New    Target Date  06/24/18        PT Long Term Goals - 05/24/18 1316      PT LONG TERM GOAL #1   Title  Pt will be I with HEP as of last visit, strengthening and posture.     Time  8    Period  Weeks    Status  New    Target Date  07/29/18      PT LONG TERM GOAL #2   Title  Pt will be able to shop , run errands for 30 min at a time with pain in legs minimal (less than 3/10)    Time  8    Period  Weeks    Status  New    Target Date  07/29/18      PT LONG TERM GOAL #3   Title  Pt will understand and demo safe body mechanics for lifting, transfers    Time  8    Period  Weeks    Status  New    Target Date  07/29/18      PT LONG TERM GOAL #4   Title  Pt will be able to improve sit to stand and balance testing (TBA)     Time  8    Period  Weeks    Status  New    Target Date   07/29/18      PT LONG TERM GOAL #5   Title  Pt will be able to demo increased hip strength to 4/5 or better in all planes     Baseline  3-/5 to 3+/5    Time  8    Period  Weeks    Status  New    Target Date  07/29/18            Plan - 05/31/18 1444    Clinical Impression Statement  Patient has been consistent with HEP and has already noticed less pain.  Cont with weakness and difficulty with sit to stand, pain shooting into bilateral thighs ant and posterior.  22 inch sit to stand in 30 sec was 9 reps.  Needs balance work and Teacher, music.      PT Treatment/Interventions  ADLs/Self Care Home Management;Cryotherapy;Ultrasound;Balance training;Orthotic Fit/Training;Moist Heat;Traction;Gait training;Electrical Stimulation;DME Instruction;Therapeutic exercise;Therapeutic activities;Functional mobility training;Neuromuscular re-education;Patient/family education;Manual techniques;Passive range of motion;Taping    PT Next Visit Plan  lifting / mechanics , try NuStep, consider prone at some point ,hip strength , balance screen    PT Home Exercise Plan  LTR, hamstring stretch, sit to stand, body mechanics, seated marching, bridging    Consulted and Agree with Plan of Care  Patient  Patient will benefit from skilled therapeutic intervention in order to improve the following deficits and impairments:  Abnormal gait, Decreased balance, Decreased endurance, Decreased mobility, Difficulty walking, Hypomobility, Other (comment), Obesity, Improper body mechanics, Pain, Postural dysfunction, Impaired UE functional use, Impaired flexibility, Increased fascial restricitons, Decreased strength, Decreased activity tolerance, Decreased range of motion  Visit Diagnosis: Radiculopathy, lumbar region  Muscle weakness (generalized)  Pain in left leg  Pain in right leg     Problem List Patient Active Problem List   Diagnosis Date Noted  . Bilateral low back pain 03/17/2018  .  Gout 12/21/2017  . Lobar pneumonia ([place]) 12/10/2017  . Paroxysmal atrial fibrillation ([employer]) 12/10/2017  . AKI (acute kidney injury) ([place]) 12/10/2017  . Prediabetes 02/05/2016  . History of colonic polyps 10/22/2014  . 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 Vitamin D deficiency 10/31/2012  . ECZEMA 03/03/2010  . 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 Hyperlipidemia 08/02/2007  . Essential hypertension 08/02/2007  . Osteopenia 08/02/2007    Maddi Collar 05/31/2018, 3:06 PM  [facility] [address] [place], [place], [address] Phone: [phone]   Fax:  [phone]  Name: Sharon Knight MRN: [id number] Date of Birth: July 19, 1945  Raeford Razor, PT 05/31/18 3:06 PM Phone: [phone] Fax: [phone]

## 2018-06-02 ENCOUNTER — Ambulatory Visit: Payer: Medicare Other | Admitting: Physical Therapy

## 2018-06-02 ENCOUNTER — Encounter: Payer: Self-pay | Admitting: Physical Therapy

## 2018-06-02 DIAGNOSIS — M79605 Pain in left leg: Secondary | ICD-10-CM

## 2018-06-02 DIAGNOSIS — M5416 Radiculopathy, lumbar region: Secondary | ICD-10-CM

## 2018-06-02 DIAGNOSIS — M79604 Pain in right leg: Secondary | ICD-10-CM | POA: Diagnosis not present

## 2018-06-02 DIAGNOSIS — M6281 Muscle weakness (generalized): Secondary | ICD-10-CM

## 2018-06-02 DIAGNOSIS — I10 Essential (primary) hypertension: Secondary | ICD-10-CM | POA: Diagnosis not present

## 2018-06-02 LAB — BASIC METABOLIC PANEL
BUN/Creatinine Ratio: 17 (ref 12–28)
BUN: 18 mg/dL (ref 8–27)
CO2: 25 mmol/L (ref 20–29)
Calcium: 10.3 mg/dL (ref 8.7–10.3)
Chloride: 99 mmol/L (ref 96–106)
Creatinine, Ser: 1.03 mg/dL — ABNORMAL HIGH (ref 0.57–1.00)
GFR calc Af Amer: 62 mL/min/{1.73_m2} (ref >59)
GFR calc non Af Amer: 54 mL/min/{1.73_m2} — ABNORMAL LOW (ref >59)
GLUCOSE: 73 mg/dL (ref 65–99)
Potassium: 4.7 mmol/L (ref 3.5–5.2)
Sodium: 141 mmol/L (ref 134–144)

## 2018-06-02 NOTE — Therapy (Signed)
[place], [place], [address] Phone: [phone]   Fax:  [phone]  Physical Therapy Treatment  Patient Details  Name: Sharon Knight MRN: [id number] Date of Birth: 1945/07/28 Referring Provider (PT): Dr. Celso Amy    Encounter Date: 06/02/2018  PT End of Session - 06/02/18 1459    Visit Number  3    Number of Visits  16    Date for PT Re-Evaluation  07/22/18    PT Start Time  1416    PT Stop Time  1500    PT Time Calculation (min)  44 min    Activity Tolerance  Patient tolerated treatment well    Behavior During Therapy  [facility] for tasks assessed/performed       Past Medical History:  Diagnosis Date  . Arthritis   . Bilateral cataracts    Dr Kathrin Penner  . Hyperlipidemia   . Hypertension     Past Surgical History:  Procedure Laterality Date  . COLONOSCOPY     Dr Fuller Plan  . 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 OVARIAN CYST REMOVAL      There were no vitals filed for this visit.  Subjective Assessment - 06/02/18 1421    Subjective  Exercises have helped the stiffness.  I always hurt somewhere.    Currently in Pain?  Yes    Pain Score  --   varies mild to moderate   Pain Location  Back    Pain Orientation  Right;Left;Lower    Pain Descriptors / Indicators  Aching;Tightness    Pain Radiating Towards  thighs , buttocks    Pain Frequency  Intermittent    Aggravating Factors   standing, too long,  laying on her side,  doing too much    Pain Relieving Factors  change positions,  tylenol    Effect of Pain on Daily Activities  ADL difficult                       [facility] Adult PT Treatment/Exercise - 06/02/18 0001      Self-Care   Other Self-Care Comments   demo dead lift X1      Lumbar Exercises: Stretches   Passive Hamstring Stretch  3 reps;30 seconds    Passive Hamstring Stretch Limitations  strap  BOTH    Single Knee to Chest Stretch  3 reps;10  seconds;Left;Right    Hip Flexor Stretch  3 reps;30 seconds    Hip Flexor Stretch Limitations  cues      Lumbar Exercises: Aerobic   Nustep  5 min L 5 UE and LE    cued for right hip position to decrease pain.     Lumbar Exercises: Seated   Sit to Stand  5 reps    Sit to Stand Limitations  higher surface      Lumbar Exercises: Supine   Bridge  10 reps    Other Supine Lumbar Exercises  decompression ex  hooklying              PT Education - 06/02/18 1459    Education Details  exercise form    Person(s) Educated  Patient    Methods  Explanation;Demonstration;Tactile cues;Verbal cues    Comprehension  Returned demonstration;Verbalized understanding       PT Short Term Goals - 05/24/18 1311      PT SHORT TERM GOAL #1   Title  Pt will be I  with HEP for trunk, LE strength    Time  4    Period  Weeks    Status  New    Target Date  06/24/18      PT SHORT TERM GOAL #2   Title  Pt will be able to stand from bed in the night with less pain overall improved 25%    Time  4    Period  Weeks    Status  New    Target Date  06/24/18      PT SHORT TERM GOAL #3   Title  Pt will be able to sit to stand without UE assist from standard chair x 5    Time  4    Period  Weeks    Status  New    Target Date  06/24/18      PT SHORT TERM GOAL #4   Title  Pt will complete balance screen and set goal if appropriate    Time  4    Period  Weeks    Status  New    Target Date  06/24/18        PT Long Term Goals - 05/24/18 1316      PT LONG TERM GOAL #1   Title  Pt will be I with HEP as of last visit, strengthening and posture.     Time  8    Period  Weeks    Status  New    Target Date  07/29/18      PT LONG TERM GOAL #2   Title  Pt will be able to shop , run errands for 30 min at a time with pain in legs minimal (less than 3/10)    Time  8    Period  Weeks    Status  New    Target Date  07/29/18      PT LONG TERM GOAL #3   Title  Pt will understand and demo safe body  mechanics for lifting, transfers    Time  8    Period  Weeks    Status  New    Target Date  07/29/18      PT LONG TERM GOAL #4   Title  Pt will be able to improve sit to stand and balance testing (TBA)     Time  8    Period  Weeks    Status  New    Target Date  07/29/18      PT LONG TERM GOAL #5   Title  Pt will be able to demo increased hip strength to 4/5 or better in all planes     Baseline  3-/5 to 3+/5    Time  8    Period  Weeks    Status  New    Target Date  07/29/18            Plan - 06/02/18 1500    Clinical Impression Statement  exercise focus today.  Patient able to demo dead lift 0 LBS from standing without pain.    PT Next Visit Plan  lifting / mechanics , try NuStep, consider prone at some point ,hip strength , balance screen    PT Home Exercise Plan  LTR, hamstring stretch, sit to stand, body mechanics, seated marching, bridging    Consulted and Agree with Plan of Care  Patient       Patient will benefit from skilled therapeutic intervention in order to improve the following  deficits and impairments:     Visit Diagnosis: Radiculopathy, lumbar region  Muscle weakness (generalized)  Pain in left leg  Pain in right leg     Problem List Patient Active Problem List   Diagnosis Date Noted  . Bilateral low back pain 03/17/2018  . Gout 12/21/2017  . Lobar pneumonia ([place]) 12/10/2017  . Paroxysmal atrial fibrillation ([place]) 12/10/2017  . AKI (acute kidney injury) ([place]) 12/10/2017  . Prediabetes 02/05/2016  . History of colonic polyps 10/22/2014  . 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 Vitamin D deficiency 10/31/2012  . ECZEMA 03/03/2010  . 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 Hyperlipidemia 08/02/2007  . Essential hypertension 08/02/2007  . Osteopenia 08/02/2007    Liam Cammarata  PTA 06/02/2018, 6:26 PM  [facility] [address] [place], [place], [address] Phone: [phone]    Fax:  [phone]  Name: Sharon Knight MRN: [id number] Date of Birth: October 12, 1945

## 2018-06-07 ENCOUNTER — Ambulatory Visit: Payer: Medicare Other | Attending: Internal Medicine | Admitting: Physical Therapy

## 2018-06-07 ENCOUNTER — Encounter: Payer: Self-pay | Admitting: Physical Therapy

## 2018-06-07 DIAGNOSIS — M79604 Pain in right leg: Secondary | ICD-10-CM | POA: Diagnosis not present

## 2018-06-07 DIAGNOSIS — M79605 Pain in left leg: Secondary | ICD-10-CM | POA: Diagnosis not present

## 2018-06-07 DIAGNOSIS — M6281 Muscle weakness (generalized): Secondary | ICD-10-CM | POA: Diagnosis not present

## 2018-06-07 DIAGNOSIS — M5416 Radiculopathy, lumbar region: Secondary | ICD-10-CM | POA: Diagnosis not present

## 2018-06-07 NOTE — Therapy (Addendum)
[place], [place], [address] Phone: [phone]   Fax:  [phone]  Physical Therapy Treatment  Patient Details  Name: Sharon Knight MRN: [id number] Date of Birth: 26-Nov-1945 Referring Provider (PT): Dr. Celso Amy    Encounter Date: 06/07/2018  PT End of Session - 06/07/18 1444    Visit Number  4    Number of Visits  16    Date for PT Re-Evaluation  07/22/18    PT Start Time  7510    PT Stop Time  1509    PT Time Calculation (min)  54 min    Activity Tolerance  Patient tolerated treatment well    Behavior During Therapy  [facility] for tasks assessed/performed       Past Medical History:  Diagnosis Date  . Arthritis   . Bilateral cataracts    Dr Kathrin Penner  . Hyperlipidemia   . Hypertension     Past Surgical History:  Procedure Laterality Date  . COLONOSCOPY     Dr Fuller Plan  . 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 OVARIAN CYST REMOVAL      There were no vitals filed for this visit.  Subjective Assessment - 06/07/18 1419    Subjective  I have slept better and I am waking less.  DId my exercises this AM.  back not hurting right now.      Currently in Pain?  No/denies        [facility] Adult PT Treatment/Exercise - 06/07/18 0001      Dynamic Gait Index   Level Surface  Mild Impairment    Change in Gait Speed  Mild Impairment    Gait with Horizontal Head Turns  Moderate Impairment    Gait with Vertical Head Turns  Moderate Impairment    Gait and Pivot Turn  Mild Impairment    Step Over Obstacle  Mild Impairment    Step Around Obstacles  Mild Impairment    Steps  Moderate Impairment    Total Score  13      High Level Balance   High Level Balance Comments  4 square step test x 14 sec with 3 trials, mod cues       Lumbar Exercises: Stretches   Active Hamstring Stretch  2 reps;30 seconds    Single Knee to Chest Stretch  1 rep;20 seconds    Single Knee to Chest Stretch Limitations  pain  in knees     Lower Trunk Rotation  10 seconds    Lower Trunk Rotation Limitations  x 10     Hip Flexor Stretch  2 reps;30 seconds      Lumbar Exercises: Aerobic   Nustep  L 3 UE and LE for 5 min       Lumbar Exercises: Sidelying   Clam  Both;10 reps         PT Short Term Goals - 06/07/18 1445      PT SHORT TERM GOAL #1   Title  Pt will be I with HEP for trunk, LE strength    Baseline  needs cues     Status  On-going      PT SHORT TERM GOAL #2   Title  Pt will be able to stand from bed in the night with less pain overall improved 25%    Baseline  trending towards improvement     Status  Partially Met      PT SHORT TERM  GOAL #3   Title  Pt will be able to sit to stand without UE assist from standard chair x 5    Baseline  needs UE     Status  On-going      PT SHORT TERM GOAL #4   Title  Pt will complete balance screen and set goal if appropriate    Baseline  DGI goal 16/26     Status  Achieved        PT Long Term Goals - 05/24/18 1316      PT LONG TERM GOAL #1   Title  Pt will be I with HEP as of last visit, strengthening and posture.     Time  8    Period  Weeks    Status  New    Target Date  07/29/18      PT LONG TERM GOAL #2   Title  Pt will be able to shop , run errands for 30 min at a time with pain in legs minimal (less than 3/10)    Time  8    Period  Weeks    Status  New    Target Date  07/29/18      PT LONG TERM GOAL #3   Title  Pt will understand and demo safe body mechanics for lifting, transfers    Time  8    Period  Weeks    Status  New    Target Date  07/29/18      PT LONG TERM GOAL #4   Title  Pt will be able to improve sit to stand and balance testing (TBA)     Time  8    Period  Weeks    Status  New    Target Date  07/29/18      PT LONG TERM GOAL #5   Title  Pt will be able to demo increased hip strength to 4/5 or better in all planes     Baseline  3-/5 to 3+/5    Time  8    Period  Weeks    Status  New    Target Date   07/29/18            Plan - 06/07/18 1448    Clinical Impression Statement  Focused on balance activities and assessing dynamic gait.  She scored a 13 /26 on DGI with mod impairment mostly with head turns.  She is improving with pain and function in the nighttime hours.      PT Treatment/Interventions  ADLs/Self Care Home Management;Cryotherapy;Ultrasound;Balance training;Orthotic Fit/Training;Moist Heat;Traction;Gait training;Electrical Stimulation;DME Instruction;Therapeutic exercise;Therapeutic activities;Functional mobility training;Neuromuscular re-education;Patient/family education;Manual techniques;Passive range of motion;Taping    PT Next Visit Plan  lifting / mechanics , try NuStep, consider prone at some point ,hip strength    PT Home Exercise Plan  LTR, hamstring stretch, sit to stand, body mechanics, seated marching, bridging    Consulted and Agree with Plan of Care  Patient       Patient will benefit from skilled therapeutic intervention in order to improve the following deficits and impairments:  Abnormal gait, Decreased balance, Decreased endurance, Decreased mobility, Difficulty walking, Hypomobility, Other (comment), Obesity, Improper body mechanics, Pain, Postural dysfunction, Impaired UE functional use, Impaired flexibility, Increased fascial restricitons, Decreased strength, Decreased activity tolerance, Decreased range of motion  Visit Diagnosis: Radiculopathy, lumbar region  Muscle weakness (generalized)  Pain in left leg  Pain in right leg     Problem List Patient Active Problem List  Diagnosis Date Noted  . Bilateral low back pain 03/17/2018  . Gout 12/21/2017  . Lobar pneumonia ([place]) 12/10/2017  . Paroxysmal atrial fibrillation ([place] [address] [place]) 12/10/2017  . AKI (acute kidney injury) ([place]) 12/10/2017  . Prediabetes 02/05/2016  . History of colonic polyps 10/22/2014  . 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 Vitamin D deficiency 10/31/2012  . ECZEMA  03/03/2010  . 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 Hyperlipidemia 08/02/2007  . Essential hypertension 08/02/2007  . Osteopenia 08/02/2007    Vadhir Mcnay 06/07/2018, 3:13 PM  [facility] [address] [place], [place], [address] Phone: [phone]   Fax:  [phone]  Name: Sharon Knight MRN: [id number] Date of Birth: Mar 30, 1946  Raeford Razor, PT 06/07/18 3:13 PM Phone: [phone] Fax: [phone]

## 2018-06-09 ENCOUNTER — Encounter: Payer: Self-pay | Admitting: Physical Therapy

## 2018-06-09 ENCOUNTER — Ambulatory Visit: Payer: Medicare Other | Admitting: Physical Therapy

## 2018-06-09 DIAGNOSIS — M6281 Muscle weakness (generalized): Secondary | ICD-10-CM

## 2018-06-09 DIAGNOSIS — M5416 Radiculopathy, lumbar region: Secondary | ICD-10-CM

## 2018-06-09 DIAGNOSIS — M79604 Pain in right leg: Secondary | ICD-10-CM

## 2018-06-09 DIAGNOSIS — M79605 Pain in left leg: Secondary | ICD-10-CM | POA: Diagnosis not present

## 2018-06-09 NOTE — Therapy (Signed)
[place], [place], [address] Phone: [phone]   Fax:  [phone]  Physical Therapy Treatment  Patient Details  Name: Sharon Knight MRN: [id number] Date of Birth: 1945-07-18 Referring Provider (PT): Dr. Celso Amy    Encounter Date: 06/09/2018  PT End of Session - 06/09/18 1336    Visit Number  5    Number of Visits  16    Date for PT Re-Evaluation  07/22/18    PT Start Time  1325    PT Stop Time  1415    PT Time Calculation (min)  50 min    Activity Tolerance  Patient tolerated treatment well    Behavior During Therapy  [facility] for tasks assessed/performed       Past Medical History:  Diagnosis Date  . Arthritis   . Bilateral cataracts    Dr Kathrin Penner  . Hyperlipidemia   . Hypertension     Past Surgical History:  Procedure Laterality Date  . COLONOSCOPY     Dr Fuller Plan  . 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 OVARIAN CYST REMOVAL      There were no vitals filed for this visit.  Subjective Assessment - 06/09/18 1333    Subjective  I'm not hurting much today.  I slept poorly but i'm doing better even in the AM too.     Currently in Pain?  No/denies         [facility] Adult PT Treatment/Exercise - 06/09/18 0001      Lumbar Exercises: Stretches   Piriformis Stretch  2 reps;30 seconds    Piriformis Stretch Limitations  supine     Figure 4 Stretch  2 reps;30 seconds    Figure 4 Stretch Limitations  seated       Lumbar Exercises: Aerobic   Nustep  L2 UE and LE for 5 min       Lumbar Exercises: Standing   Wall Slides  10 reps    Wall Slides Limitations  small ROM     Row  --    Theraband Level (Row)  --    Shoulder Extension Limitations  horizontal pull yellow x 10      Lumbar Exercises: Seated   Sit to Stand  10 reps    Sit to Stand Limitations  higher surface    Other Seated Lumbar Exercises  unable to do squats without knee pain , attempted x 5       Lumbar Exercises:  Sidelying   Clam  Both;20 reps    Hip Abduction  Both;10 reps    Hip Abduction Weights (lbs)  knee bent       Lumbar Exercises: Prone   Straight Leg Raise  10 reps    Straight Leg Raises Limitations  reverse quad set x 10 bilateral     Other Prone Lumbar Exercises  prone knee bend x 10 , pain in R knee     Other Prone Lumbar Exercises  press up thoracic extension (pillow under hips)                PT Short Term Goals - 06/07/18 1445      PT SHORT TERM GOAL #1   Title  Pt will be I with HEP for trunk, LE strength    Baseline  needs cues     Status  On-going      PT SHORT TERM GOAL #2   Title  Pt will  be able to stand from bed in the night with less pain overall improved 25%    Baseline  trending towards improvement     Status  Partially Met      PT SHORT TERM GOAL #3   Title  Pt will be able to sit to stand without UE assist from standard chair x 5    Baseline  needs UE     Status  On-going      PT SHORT TERM GOAL #4   Title  Pt will complete balance screen and set goal if appropriate    Baseline  DGI goal 16/26     Status  Achieved        PT Long Term Goals - 05/24/18 1316      PT LONG TERM GOAL #1   Title  Pt will be I with HEP as of last visit, strengthening and posture.     Time  8    Period  Weeks    Status  New    Target Date  07/29/18      PT LONG TERM GOAL #2   Title  Pt will be able to shop , run errands for 30 min at a time with pain in legs minimal (less than 3/10)    Time  8    Period  Weeks    Status  New    Target Date  07/29/18      PT LONG TERM GOAL #3   Title  Pt will understand and demo safe body mechanics for lifting, transfers    Time  8    Period  Weeks    Status  New    Target Date  07/29/18      PT LONG TERM GOAL #4   Title  Pt will be able to improve sit to stand and balance testing (TBA)     Time  8    Period  Weeks    Status  New    Target Date  07/29/18      PT LONG TERM GOAL #5   Title  Pt will be able to demo  increased hip strength to 4/5 or better in all planes     Baseline  3-/5 to 3+/5    Time  8    Period  Weeks    Status  New    Target Date  07/29/18            Plan - 06/09/18 1414    Clinical Impression Statement  Pt with less pain overall.  Worked on posture and hip strength .  Limited in standing upper back strength against wall, will add to HEP next week.  No pain increase, just fatigue.     PT Treatment/Interventions  ADLs/Self Care Home Management;Cryotherapy;Ultrasound;Balance training;Orthotic Fit/Training;Moist Heat;Traction;Gait training;Electrical Stimulation;DME Instruction;Therapeutic exercise;Therapeutic activities;Functional mobility training;Neuromuscular re-education;Patient/family education;Manual techniques;Passive range of motion;Taping    PT Next Visit Plan  standing posture, wall ex, light bands add to HEP    PT Home Exercise Plan  LTR, hamstring stretch, sit to stand, body mechanics, seated marching, bridging    Consulted and Agree with Plan of Care  Patient       Patient will benefit from skilled therapeutic intervention in order to improve the following deficits and impairments:  Abnormal gait, Decreased balance, Decreased endurance, Decreased mobility, Difficulty walking, Hypomobility, Other (comment), Obesity, Improper body mechanics, Pain, Postural dysfunction, Impaired UE functional use, Impaired flexibility, Increased fascial restricitons, Decreased strength, Decreased activity tolerance, Decreased  range of motion  Visit Diagnosis: Radiculopathy, lumbar region  Muscle weakness (generalized)  Pain in left leg  Pain in right leg     Problem List Patient Active Problem List   Diagnosis Date Noted  . Bilateral low back pain 03/17/2018  . Gout 12/21/2017  . Lobar pneumonia ([place]) 12/10/2017  . Paroxysmal atrial fibrillation ([place]) 12/10/2017  . AKI (acute kidney injury) ([place]) 12/10/2017  . Prediabetes 02/05/2016  . History of colonic polyps  10/22/2014  . 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 Vitamin D deficiency 10/31/2012  . ECZEMA 03/03/2010  . 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 Hyperlipidemia 08/02/2007  . Essential hypertension 08/02/2007  . Osteopenia 08/02/2007    Tracyann Duffell 06/09/2018, 2:17 PM  [facility] [address] [place], [place], [address] Phone: [phone]   Fax:  [phone]  Name: Sharon Knight MRN: [id number] Date of Birth: 11-27-1945  Raeford Razor, PT 06/09/18 2:17 PM Phone: [phone] Fax: [phone]

## 2018-06-14 ENCOUNTER — Ambulatory Visit: Payer: Medicare Other | Admitting: Physical Therapy

## 2018-06-14 DIAGNOSIS — M79605 Pain in left leg: Secondary | ICD-10-CM

## 2018-06-14 DIAGNOSIS — M5416 Radiculopathy, lumbar region: Secondary | ICD-10-CM | POA: Diagnosis not present

## 2018-06-14 DIAGNOSIS — M6281 Muscle weakness (generalized): Secondary | ICD-10-CM | POA: Diagnosis not present

## 2018-06-14 DIAGNOSIS — M79604 Pain in right leg: Secondary | ICD-10-CM

## 2018-06-14 NOTE — Therapy (Signed)
[place], [place], [address] Phone: [phone]   Fax:  [phone]  Physical Therapy Treatment  Patient Details  Name: Sharon Knight MRN: [id number] Date of Birth: 09/30/1945 Referring Provider (PT): Dr. Celso Amy    Encounter Date: 06/14/2018  PT End of Session - 06/14/18 1422    Visit Number  6    Number of Visits  16    Date for PT Re-Evaluation  07/22/18    PT Start Time  6333    PT Stop Time  1500    PT Time Calculation (min)  43 min    Activity Tolerance  Patient tolerated treatment well    Behavior During Therapy  [facility] for tasks assessed/performed       Past Medical History:  Diagnosis Date  . Arthritis   . Bilateral cataracts    Dr Kathrin Penner  . Hyperlipidemia   . Hypertension     Past Surgical History:  Procedure Laterality Date  . COLONOSCOPY     Dr Fuller Plan  . 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 OVARIAN CYST REMOVAL      There were no vitals filed for this visit.        [facility] Adult PT Treatment/Exercise - 06/14/18 0001      Lumbar Exercises: Stretches   Active Hamstring Stretch  2 reps;30 seconds    Lower Trunk Rotation  10 seconds    Lower Trunk Rotation Limitations  x 10     Hip Flexor Stretch  1 rep;60 seconds    Hip Flexor Stretch Limitations  off table       Lumbar Exercises: Aerobic   Nustep  L6 LE only for 6 min       Lumbar Exercises: Machines for Strengthening   Leg Press  1 plate x 15 reps close Supervision       Lumbar Exercises: Supine   AB Set Limitations  used ball for isometric core x 10     Clam  20 reps    Clam Limitations  green band bilateral and unilateral     Heel Slides Limitations  knee flex, hip flex on ball    x 20 , cues    Bridge  10 reps    Bridge Limitations  on ball                PT Short Term Goals - 06/07/18 1445      PT SHORT TERM GOAL #1   Title  Pt will be I with HEP for trunk, LE strength    Baseline  needs cues     Status  On-going      PT SHORT TERM GOAL #2   Title  Pt will be able to stand from bed in the night with less pain overall improved 25%    Baseline  trending towards improvement     Status  Partially Met      PT SHORT TERM GOAL #3   Title  Pt will be able to sit to stand without UE assist from standard chair x 5    Baseline  needs UE     Status  On-going      PT SHORT TERM GOAL #4   Title  Pt will complete balance screen and set goal if appropriate    Baseline  DGI goal 16/26     Status  Achieved        PT Long Term  Goals - 05/24/18 1316      PT LONG TERM GOAL #1   Title  Pt will be I with HEP as of last visit, strengthening and posture.     Time  8    Period  Weeks    Status  New    Target Date  07/29/18      PT LONG TERM GOAL #2   Title  Pt will be able to shop , run errands for 30 min at a time with pain in legs minimal (less than 3/10)    Time  8    Period  Weeks    Status  New    Target Date  07/29/18      PT LONG TERM GOAL #3   Title  Pt will understand and demo safe body mechanics for lifting, transfers    Time  8    Period  Weeks    Status  New    Target Date  07/29/18      PT LONG TERM GOAL #4   Title  Pt will be able to improve sit to stand and balance testing (TBA)     Time  8    Period  Weeks    Status  New    Target Date  07/29/18      PT LONG TERM GOAL #5   Title  Pt will be able to demo increased hip strength to 4/5 or better in all planes     Baseline  3-/5 to 3+/5    Time  8    Period  Weeks    Status  New    Target Date  07/29/18            Plan - 06/14/18 1458    Clinical Impression Statement  Patient doing well, worked on core and hip strength , and soft tissue to rectus femoris in stretch position.  Progressing towards goals , having less stiffness in the AM and she can take her pain down to a manageable level.      PT Treatment/Interventions  ADLs/Self Care Home Management;Cryotherapy;Ultrasound;Balance  training;Orthotic Fit/Training;Moist Heat;Traction;Gait training;Electrical Stimulation;DME Instruction;Therapeutic exercise;Therapeutic activities;Functional mobility training;Neuromuscular re-education;Patient/family education;Manual techniques;Passive range of motion;Taping    PT Next Visit Plan  standing posture, wall ex, light bands add to HEP.  Give clams     PT Home Exercise Plan  LTR, hamstring stretch, sit to stand, body mechanics, seated marching, bridging    Consulted and Agree with Plan of Care  Patient       Patient will benefit from skilled therapeutic intervention in order to improve the following deficits and impairments:  Abnormal gait, Decreased balance, Decreased endurance, Decreased mobility, Difficulty walking, Hypomobility, Other (comment), Obesity, Improper body mechanics, Pain, Postural dysfunction, Impaired UE functional use, Impaired flexibility, Increased fascial restricitons, Decreased strength, Decreased activity tolerance, Decreased range of motion  Visit Diagnosis: Radiculopathy, lumbar region  Muscle weakness (generalized)  Pain in left leg  Pain in right leg     Problem List Patient Active Problem List   Diagnosis Date Noted  . Bilateral low back pain 03/17/2018  . Gout 12/21/2017  . Lobar pneumonia ([place]) 12/10/2017  . Paroxysmal atrial fibrillation ([place]) 12/10/2017  . AKI (acute kidney injury) ([place]) 12/10/2017  . Prediabetes 02/05/2016  . History of colonic polyps 10/22/2014  . 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 Vitamin D deficiency 10/31/2012  . ECZEMA 03/03/2010  . 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 Hyperlipidemia 08/02/2007  . Essential  hypertension 08/02/2007  . Osteopenia 08/02/2007    PAA,JENNIFER 06/14/2018, 3:02 PM  [facility] [address] [place], [place], [address] Phone: [phone]   Fax:  [phone]  Name: Soniya Ashraf MRN: [id number] Date of Birth:  [birth year]  Raeford Razor, PT 06/14/18 3:02 PM Phone: [phone] Fax: [phone]

## 2018-06-16 ENCOUNTER — Other Ambulatory Visit: Payer: Self-pay | Admitting: Physician Assistant

## 2018-06-16 ENCOUNTER — Ambulatory Visit: Payer: Medicare Other | Admitting: Physical Therapy

## 2018-06-16 DIAGNOSIS — M5416 Radiculopathy, lumbar region: Secondary | ICD-10-CM

## 2018-06-16 DIAGNOSIS — M6281 Muscle weakness (generalized): Secondary | ICD-10-CM

## 2018-06-16 DIAGNOSIS — M79604 Pain in right leg: Secondary | ICD-10-CM

## 2018-06-16 DIAGNOSIS — M79605 Pain in left leg: Secondary | ICD-10-CM | POA: Diagnosis not present

## 2018-06-16 NOTE — Therapy (Signed)
[place], [place], [address] Phone: [phone]   Fax:  [phone]  Physical Therapy Treatment  Patient Details  Name: Sharon Knight MRN: [id number] Date of Birth: 12-05-1945 Referring Provider (PT): Dr. Celso Amy    Encounter Date: 06/16/2018  PT End of Session - 06/16/18 1442    Visit Number  7    Number of Visits  16    Date for PT Re-Evaluation  07/22/18    PT Start Time  6387    PT Stop Time  1500    PT Time Calculation (min)  45 min    Activity Tolerance  Patient tolerated treatment well    Behavior During Therapy  [facility] for tasks assessed/performed       Past Medical History:  Diagnosis Date  . Arthritis   . Bilateral cataracts    Dr Kathrin Penner  . Hyperlipidemia   . Hypertension     Past Surgical History:  Procedure Laterality Date  . COLONOSCOPY     Dr Fuller Plan  . 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 OVARIAN CYST REMOVAL      There were no vitals filed for this visit.          [facility] Adult PT Treatment/Exercise - 06/16/18 0001      Lumbar Exercises: Aerobic   Nustep  L3 LE only for 6 min    tired, end of session      Lumbar Exercises: Standing   Row  Strengthening;20 reps    Theraband Level (Row)  Level 2 (Red)    Shoulder Extension  Strengthening;Both;10 reps      Lumbar Exercises: Seated   Long Arc Quad on Chair  Strengthening;Both;1 set;20 reps;Weights    LAQ on Chair Weights (lbs)  4    Sit to Stand  5 reps    Sit to Stand Limitations  27 sec knee pain     Other Seated Lumbar Exercises  did not want to continue sit to stand       Lumbar Exercises: Supine   Clam  20 reps    Clam Limitations  green band bilateral and unilateral     Bridge with clamshell  10 reps      Lumbar Exercises: Sidelying   Clam  Both;20 reps    Clam Limitations  added green band     Hip Abduction  Both;10 reps    Hip Abduction Weights (lbs)  knee ext               PT Education - 06/16/18 1456    Education Details  New HEP for strength     Person(s) Educated  Patient    Methods  Explanation    Comprehension  Verbalized understanding;Verbal cues required;Need further instruction       PT Short Term Goals - 06/16/18 1416      PT SHORT TERM GOAL #1   Title  Pt will be I with HEP for trunk, LE strength    Status  Achieved      PT SHORT TERM GOAL #2   Title  Pt will be able to stand from bed in the night with less pain overall improved 25%    Baseline  still stiff and sore, but no pain as she gets to the bathroom     Status  Partially Met      PT SHORT TERM GOAL #3   Title  Pt will be able  to sit to stand without UE assist from standard chair x 5    Baseline  needs UE OR seat high, takes 27 sec      PT SHORT TERM GOAL #4   Title  Pt will complete balance screen and set goal if appropriate    Status  Achieved        PT Long Term Goals - 06/16/18 1417      PT LONG TERM GOAL #1   Title  Pt will be I with HEP as of last visit, strengthening and posture.     Status  On-going      PT LONG TERM GOAL #2   Title  Pt will be able to shop , run errands for 30 min at a time with pain in legs minimal (less than 3/10)    Baseline  when she gets home she is tired and also has pain     Status  On-going      PT LONG TERM GOAL #3   Title  Pt will understand and demo safe body mechanics for lifting, transfers    Status  On-going      PT LONG TERM GOAL #4   Title  Pt will be able to improve sit to stand time from a slightly higher chair to less than 20 sec. no UE assist     Status  On-going      PT LONG TERM GOAL #5   Title  Pt will be able to demo increased hip strength to 4/5 or better in all planes     Status  On-going            Plan - 06/16/18 1425    Clinical Impression Statement  Pt doing well, limited in standing by knee pain.  Needs work on postural, advised she make a few more appts to increase work level.  No  pain post just fatigue.     PT Treatment/Interventions  ADLs/Self Care Home Management;Cryotherapy;Ultrasound;Balance training;Orthotic Fit/Training;Moist Heat;Traction;Gait training;Electrical Stimulation;DME Instruction;Therapeutic exercise;Therapeutic activities;Functional mobility training;Neuromuscular re-education;Patient/family education;Manual techniques;Passive range of motion;Taping    PT Home Exercise Plan  LTR, hamstring stretch, sit to stand, body mechanics, seated marching, bridging and clam with band , sidelying    Consulted and Agree with Plan of Care  Patient       Patient will benefit from skilled therapeutic intervention in order to improve the following deficits and impairments:  Abnormal gait, Decreased balance, Decreased endurance, Decreased mobility, Difficulty walking, Hypomobility, Other (comment), Obesity, Improper body mechanics, Pain, Postural dysfunction, Impaired UE functional use, Impaired flexibility, Increased fascial restricitons, Decreased strength, Decreased activity tolerance, Decreased range of motion  Visit Diagnosis: Radiculopathy, lumbar region  Muscle weakness (generalized)  Pain in left leg  Pain in right leg     Problem List Patient Active Problem List   Diagnosis Date Noted  . Bilateral low back pain 03/17/2018  . Gout 12/21/2017  . Lobar pneumonia ([place]) 12/10/2017  . Paroxysmal atrial fibrillation ([place]) 12/10/2017  . AKI (acute kidney injury) ([place]) 12/10/2017  . Prediabetes 02/05/2016  . History of colonic polyps 10/22/2014  . 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 Vitamin D deficiency 10/31/2012  . ECZEMA 03/03/2010  . 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 Hyperlipidemia 08/02/2007  . Essential hypertension 08/02/2007  . Osteopenia 08/02/2007    PAA,JENNIFER 06/16/2018, 3:02 PM  [facility] [address] [place], [place], [address] Phone: [phone]   Fax:   [phone]  Name:  Liliann File MRN: [id number] Date of Birth: March 28, 1946   Raeford Razor, PT 06/16/18 3:02 PM Phone: [phone] Fax: [phone]

## 2018-06-16 NOTE — Patient Instructions (Addendum)
Low Row: Standing    Face anchor, feet shoulder width apart. Palms up, pull arms back, squeezing shoulder blades together. Repeat __10 times per set. Do _1_ sets per session. Do 5__ sessions per week. Anchor Height: Waist  http://tub.exer.us/66   Copyright  VHI. All rights reserved.    Step 1  Step 2  Supine Bridge with Resistance Band reps: 10  sets: 2  hold: 5  daily: 1  weekly: 7 Setup  Begin lying on your back with your arms laying at your sides, your legs bent at the knees and your feet flat on the ground, with a resistance band secured around your legs. Movement  Maintaining tension in the resistance band, tighten your abdominals and slowly lift your hips off the floor into a bridge position, keeping your back straight. Tip  Make sure to keep your trunk stiff throughout the exercise and your arms flat on the floor. Step 1  Step 2  Bent Knee Fallouts reps: 10  sets: 2  hold: 5  daily: 1  weekly: 7 Setup  Begin lying on your back with your knees bent and feet resting on the floor. Movement  Engage your abdominals and slowly lower one knee towards the ground. Return to the starting position and repeat with the other leg. Tip  Make sure to breathe and do not allow your hips or trunk to rotate during the exercise. Step 1  Step 2  Clamshell with Resistance reps: 10  sets: 2  hold: 5  daily: 1  weekly: 7 Setup  Begin by lying on your side with your knees bent 90 degrees, hips and shoulders stacked, and a resistance loop secured around your legs.  Movement  Raise your top knee away from the bottom one, then slowly return to the starting position.  Tip  Make sure not to roll your hips forward or backward during the exercise. Step 1  Step 2  Sidelying Hip Abduction reps: 10  sets: 2  hold: 5  daily: 1  weekly: 7 Setup  Begin lying on your side with your legs straight. Movement  Slowly lift your top leg up towards the ceiling, then lower it back to the  starting position and repeat.  Tip  Make sure to keep your knee straight and do not let your hips roll forward or backward during the exercise.

## 2018-06-21 ENCOUNTER — Encounter: Payer: Self-pay | Admitting: Physical Therapy

## 2018-06-21 ENCOUNTER — Ambulatory Visit: Payer: Medicare Other | Admitting: Physical Therapy

## 2018-06-21 DIAGNOSIS — M6281 Muscle weakness (generalized): Secondary | ICD-10-CM | POA: Diagnosis not present

## 2018-06-21 DIAGNOSIS — M79604 Pain in right leg: Secondary | ICD-10-CM

## 2018-06-21 DIAGNOSIS — M79605 Pain in left leg: Secondary | ICD-10-CM

## 2018-06-21 DIAGNOSIS — M5416 Radiculopathy, lumbar region: Secondary | ICD-10-CM

## 2018-06-21 NOTE — Therapy (Signed)
[place], [place], [address] Phone: [phone]   Fax:  [phone]  Physical Therapy Treatment  Patient Details  Name: Sharon Knight MRN: [id number] Date of Birth: 1946-02-02 Referring Provider (PT): Dr. Celso Amy    Encounter Date: 06/21/2018  PT End of Session - 06/21/18 1430    Visit Number  8    Number of Visits  16    Date for PT Re-Evaluation  07/22/18    PT Start Time  8828    PT Stop Time  1503    PT Time Calculation (min)  43 min    Activity Tolerance  Patient tolerated treatment well    Behavior During Therapy  [facility] for tasks assessed/performed       Past Medical History:  Diagnosis Date  . Arthritis   . Bilateral cataracts    Dr Kathrin Penner  . Hyperlipidemia   . Hypertension     Past Surgical History:  Procedure Laterality Date  . COLONOSCOPY     Dr Fuller Plan  . 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 OVARIAN CYST REMOVAL      There were no vitals filed for this visit.  Subjective Assessment - 06/21/18 1428    Subjective  I fell yesterday while getting on to the floor to look at the leak at the sink.  no pain right now.  I hit my head but im ok.      Currently in Pain?  No/denies           [facility] Adult PT Treatment/Exercise - 06/21/18 0001      Lumbar Exercises: Stretches   Lower Trunk Rotation  10 seconds    Lower Trunk Rotation Limitations  x 10       Lumbar Exercises: Aerobic   Nustep  LE and UE for 5 min , L 6       Lumbar Exercises: Standing   Row  Strengthening;20 reps    Theraband Level (Row)  Level 2 (Red)    Shoulder Extension  Strengthening;Both;10 reps    Theraband Level (Shoulder Extension)  Level 2 (Red)    Other Standing Lumbar Exercises  standing hip abduction and extension x 15 each leg with UE assist countertop     Other Standing Lumbar Exercises  standing horizontal abduction x 10 red       Lumbar Exercises: Supine   Clam  20 reps    Clam  Limitations  green band bilateral and unilateral     Bridge  10 reps    Bridge with clamshell  10 reps    Straight Leg Raise  10 reps      Lumbar Exercises: Sidelying   Clam  Both;20 reps    Clam Limitations  added green band     Hip Abduction  Both;10 reps    Hip Abduction Weights (lbs)  with band , knee ext.              PT Education - 06/21/18 1430    Education Details  Clam     Person(s) Educated  Patient    Methods  Explanation    Comprehension  Verbalized understanding       PT Short Term Goals - 06/16/18 1416      PT SHORT TERM GOAL #1   Title  Pt will be I with HEP for trunk, LE strength    Status  Achieved      PT SHORT  TERM GOAL #2   Title  Pt will be able to stand from bed in the night with less pain overall improved 25%    Baseline  still stiff and sore, but no pain as she gets to the bathroom     Status  Partially Met      PT SHORT TERM GOAL #3   Title  Pt will be able to sit to stand without UE assist from standard chair x 5    Baseline  needs UE OR seat high, takes 27 sec      PT SHORT TERM GOAL #4   Title  Pt will complete balance screen and set goal if appropriate    Status  Achieved        PT Long Term Goals - 06/16/18 1417      PT LONG TERM GOAL #1   Title  Pt will be I with HEP as of last visit, strengthening and posture.     Status  On-going      PT LONG TERM GOAL #2   Title  Pt will be able to shop , run errands for 30 min at a time with pain in legs minimal (less than 3/10)    Baseline  when she gets home she is tired and also has pain     Status  On-going      PT LONG TERM GOAL #3   Title  Pt will understand and demo safe body mechanics for lifting, transfers    Status  On-going      PT LONG TERM GOAL #4   Title  Pt will be able to improve sit to stand time from a slightly higher chair to less than 20 sec. no UE assist     Status  On-going      PT LONG TERM GOAL #5   Title  Pt will be able to demo increased hip strength to  4/5 or better in all planes     Status  On-going            Plan - 06/21/18 1431    Clinical Impression Statement  Pt doing well following a fall in the bathroom.  She was squatting and her feet slipped out from uder her, it was not a fall from a high height, but she did hit her head.  Did not see MD.  No change in cognition or headache.  Needs redirection during exercises.  Cont to have AM pin in legs but can eliminate it with exercises.  Knees interfere a bit as well.  Progressing towards goals.     PT Treatment/Interventions  ADLs/Self Care Home Management;Cryotherapy;Ultrasound;Balance training;Orthotic Fit/Training;Moist Heat;Traction;Gait training;Electrical Stimulation;DME Instruction;Therapeutic exercise;Therapeutic activities;Functional mobility training;Neuromuscular re-education;Patient/family education;Manual techniques;Passive range of motion;Taping    PT Next Visit Plan  standing posture, wall ex, light bands add to HEP.  Give clams     PT Home Exercise Plan  LTR, hamstring stretch, sit to stand, body mechanics, seated marching, bridging and clam with band , sidelying    Consulted and Agree with Plan of Care  Patient       Patient will benefit from skilled therapeutic intervention in order to improve the following deficits and impairments:  Abnormal gait, Decreased balance, Decreased endurance, Decreased mobility, Difficulty walking, Hypomobility, Other (comment), Obesity, Improper body mechanics, Pain, Postural dysfunction, Impaired UE functional use, Impaired flexibility, Increased fascial restricitons, Decreased strength, Decreased activity tolerance, Decreased range of motion  Visit Diagnosis: Radiculopathy, lumbar region  Muscle weakness (generalized)  Pain in left leg  Pain in right leg     Problem List Patient Active Problem List   Diagnosis Date Noted  . Bilateral low back pain 03/17/2018  . Gout 12/21/2017  . Lobar pneumonia ([place]) 12/10/2017  . Paroxysmal  atrial fibrillation ([place]) 12/10/2017  . AKI (acute kidney injury) ([place]) 12/10/2017  . Prediabetes 02/05/2016  . History of colonic polyps 10/22/2014  . 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 Vitamin D deficiency 10/31/2012  . ECZEMA 03/03/2010  . 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 Hyperlipidemia 08/02/2007  . Essential hypertension 08/02/2007  . Osteopenia 08/02/2007    Kathlynn Swofford 06/21/2018, 3:07 PM  [facility] [address] [place], [place], [address] Phone: [phone]   Fax:  [phone]  Name: Sharon Knight MRN: [id number] Date of Birth: 20-Feb-1946  Raeford Razor, PT 06/21/18 3:08 PM Phone: [phone] Fax: [phone]

## 2018-06-23 ENCOUNTER — Encounter: Payer: Self-pay | Admitting: Physical Therapy

## 2018-06-23 ENCOUNTER — Ambulatory Visit: Payer: Medicare Other | Admitting: Physical Therapy

## 2018-06-23 DIAGNOSIS — M5416 Radiculopathy, lumbar region: Secondary | ICD-10-CM | POA: Diagnosis not present

## 2018-06-23 DIAGNOSIS — M79605 Pain in left leg: Secondary | ICD-10-CM | POA: Diagnosis not present

## 2018-06-23 DIAGNOSIS — M6281 Muscle weakness (generalized): Secondary | ICD-10-CM | POA: Diagnosis not present

## 2018-06-23 DIAGNOSIS — M79604 Pain in right leg: Secondary | ICD-10-CM | POA: Diagnosis not present

## 2018-06-23 NOTE — Therapy (Signed)
[place], [place], [address] Phone: [phone]   Fax:  [phone]  Physical Therapy Treatment  Patient Details  Name: Sharon Knight MRN: [id number] Date of Birth: 1945-08-18 Referring Provider (PT): Dr. Celso Amy    Encounter Date: 06/23/2018  PT End of Session - 06/23/18 1336    Visit Number  9    Number of Visits  16    Date for PT Re-Evaluation  07/22/18    PT Start Time  1330    PT Stop Time  1414    PT Time Calculation (min)  44 min    Activity Tolerance  Patient tolerated treatment well    Behavior During Therapy  [facility] for tasks assessed/performed       Past Medical History:  Diagnosis Date  . Arthritis   . Bilateral cataracts    Dr Kathrin Penner  . Hyperlipidemia   . Hypertension     Past Surgical History:  Procedure Laterality Date  . COLONOSCOPY     Dr Fuller Plan  . 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 OVARIAN CYST REMOVAL      There were no vitals filed for this visit.  Subjective Assessment - 06/23/18 1335    Subjective  No pain today but I havent been doing much.  Didn't do her exercises.      Currently in Pain?  No/denies         [facility] Adult PT Treatment/Exercise - 06/23/18 0001      Lumbar Exercises: Stretches   Lower Trunk Rotation  10 seconds    Lower Trunk Rotation Limitations  x 10     Hip Flexor Stretch  Right;Left;2 reps;60 seconds    Pelvic Tilt  10 reps    Piriformis Stretch  2 reps;30 seconds      Lumbar Exercises: Aerobic   Nustep  LE and UE for 5 min , L 6       Lumbar Exercises: Standing   Wall Slides  10 reps    Scapular Retraction  Strengthening;Both;10 reps    Scapular Retraction Limitations  used wall  horizontal abduction x 10    red against wall    Row  Strengthening;20 reps    Theraband Level (Row)  Level 2 (Red)    Other Standing Lumbar Exercises  standing cane against wall: cane flexion x 15, wall slides facing ball for spinal ext x  8     Other Standing Lumbar Exercises  wall slide shoulder lift off x 10 each arm , x 5 both arms       Lumbar Exercises: Supine   Ab Set  5 reps    Clam  10 reps    Clam Limitations  ab set prior    Bridge  20 reps    Other Supine Lumbar Exercises  shoulder flexion overhead  cane x 10       Lumbar Exercises: Sidelying   Hip Abduction  Both;10 reps    Hip Abduction Weights (lbs)  2 sets     Other Sidelying Lumbar Exercises  cues for full knee ext                PT Short Term Goals - 06/16/18 1416      PT SHORT TERM GOAL #1   Title  Pt will be I with HEP for trunk, LE strength    Status  Achieved      PT SHORT TERM GOAL #  2   Title  Pt will be able to stand from bed in the night with less pain overall improved 25%    Baseline  still stiff and sore, but no pain as she gets to the bathroom     Status  Partially Met      PT SHORT TERM GOAL #3   Title  Pt will be able to sit to stand without UE assist from standard chair x 5    Baseline  needs UE OR seat high, takes 27 sec      PT SHORT TERM GOAL #4   Title  Pt will complete balance screen and set goal if appropriate    Status  Achieved        PT Long Term Goals - 06/16/18 1417      PT LONG TERM GOAL #1   Title  Pt will be I with HEP as of last visit, strengthening and posture.     Status  On-going      PT LONG TERM GOAL #2   Title  Pt will be able to shop , run errands for 30 min at a time with pain in legs minimal (less than 3/10)    Baseline  when she gets home she is tired and also has pain     Status  On-going      PT LONG TERM GOAL #3   Title  Pt will understand and demo safe body mechanics for lifting, transfers    Status  On-going      PT LONG TERM GOAL #4   Title  Pt will be able to improve sit to stand time from a slightly higher chair to less than 20 sec. no UE assist     Status  On-going      PT LONG TERM GOAL #5   Title  Pt will be able to demo increased hip strength to 4/5 or better in all  planes     Status  On-going            Plan - 06/23/18 1413    Clinical Impression Statement  Patient without complaints of pain today.  She completed al exercise with increased effort, fatigue.  Had a difficult time with core exercises, just needed cues for technique.  Progressing , less and less pain in AM hours.     PT Treatment/Interventions  ADLs/Self Care Home Management;Cryotherapy;Ultrasound;Balance training;Orthotic Fit/Training;Moist Heat;Traction;Gait training;Electrical Stimulation;DME Instruction;Therapeutic exercise;Therapeutic activities;Functional mobility training;Neuromuscular re-education;Patient/family education;Manual techniques;Passive range of motion;Taping    PT Next Visit Plan  standing posture, wall ex, light bands add to HEP.  Give clams     PT Home Exercise Plan  LTR, hamstring stretch, sit to stand, body mechanics, seated marching, bridging and clam with band , sidelying    Consulted and Agree with Plan of Care  Patient       Patient will benefit from skilled therapeutic intervention in order to improve the following deficits and impairments:  Abnormal gait, Decreased balance, Decreased endurance, Decreased mobility, Difficulty walking, Hypomobility, Other (comment), Obesity, Improper body mechanics, Pain, Postural dysfunction, Impaired UE functional use, Impaired flexibility, Increased fascial restricitons, Decreased strength, Decreased activity tolerance, Decreased range of motion  Visit Diagnosis: Radiculopathy, lumbar region  Muscle weakness (generalized)  Pain in left leg  Pain in right leg     Problem List Patient Active Problem List   Diagnosis Date Noted  . Bilateral low back pain 03/17/2018  . Gout 12/21/2017  . Lobar pneumonia ([place]) 12/10/2017  .  Paroxysmal atrial fibrillation ([place]) 12/10/2017  . AKI (acute kidney injury) ([place]) 12/10/2017  . Prediabetes 02/05/2016  . History of colonic polyps 10/22/2014  . History of nephrectomy,  unilateral 11/13/2012  . Vitamin D deficiency 10/31/2012  . ECZEMA 03/03/2010  . 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 Hyperlipidemia 08/02/2007  . Essential hypertension 08/02/2007  . Osteopenia 08/02/2007    Sharon Knight 06/23/2018, 2:16 PM  [facility] [address] [place], [place], [address] Phone: [phone]   Fax:  [phone]  Name: Sharon Knight MRN: [id number] Date of Birth: Apr 02, 1946   Raeford Razor, PT 06/23/18 2:16 PM Phone: [phone] Fax: [phone]

## 2018-06-27 ENCOUNTER — Encounter: Payer: Self-pay | Admitting: Physical Therapy

## 2018-06-27 ENCOUNTER — Ambulatory Visit: Payer: Medicare Other | Admitting: Physical Therapy

## 2018-06-27 DIAGNOSIS — M79605 Pain in left leg: Secondary | ICD-10-CM

## 2018-06-27 DIAGNOSIS — M79604 Pain in right leg: Secondary | ICD-10-CM

## 2018-06-27 DIAGNOSIS — M5416 Radiculopathy, lumbar region: Secondary | ICD-10-CM | POA: Diagnosis not present

## 2018-06-27 DIAGNOSIS — M6281 Muscle weakness (generalized): Secondary | ICD-10-CM

## 2018-06-27 NOTE — Therapy (Signed)
[place], [place], [address] Phone: [phone]   Fax:  [phone]  Physical Therapy Treatment  Patient Details  Name: Sharon Knight MRN: [id number] Date of Birth: May 08, 1945 Referring Provider (PT): Dr. Celso Amy    Encounter Date: 06/27/2018  PT End of Session - 06/27/18 1424    Visit Number  10    Number of Visits  16    Date for PT Re-Evaluation  07/22/18    PT Start Time  1416    PT Stop Time  1501    PT Time Calculation (min)  45 min    Activity Tolerance  Patient tolerated treatment well;Patient limited by fatigue    Behavior During Therapy  [facility] for tasks assessed/performed       Past Medical History:  Diagnosis Date  . Arthritis   . Bilateral cataracts    Dr Kathrin Penner  . Hyperlipidemia   . Hypertension     Past Surgical History:  Procedure Laterality Date  . COLONOSCOPY     Dr Fuller Plan  . 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 OVARIAN CYST REMOVAL      There were no vitals filed for this visit.     [place] Adult PT Treatment/Exercise - 06/27/18 0001      Lumbar Exercises: Aerobic   Nustep  LE and UE for 5 min , L 6       Lumbar Exercises: Standing   Row Limitations  x 15 slastix     Shoulder Extension Limitations  x 15 slastix     Other Standing Lumbar Exercises  wide tandem alternating arms for core x 10 each side     Other Standing Lumbar Exercises  facing front alternating punch slastix x 10       Lumbar Exercises: Seated   Sit to Stand  10 reps    Sit to Stand Limitations  min knee pain       Lumbar Exercises: Sidelying   Clam  Both;15 reps    Hip Abduction  Both;15 reps    Other Sidelying Lumbar Exercises  upper trunk rotation x 5 each side                PT Short Term Goals - 06/16/18 1416      PT SHORT TERM GOAL #1   Title  Pt will be I with HEP for trunk, LE strength    Status  Achieved      PT SHORT TERM GOAL #2   Title  Pt will be able  to stand from bed in the night with less pain overall improved 25%    Baseline  still stiff and sore, but no pain as she gets to the bathroom     Status  Partially Met      PT SHORT TERM GOAL #3   Title  Pt will be able to sit to stand without UE assist from standard chair x 5    Baseline  needs UE OR seat high, takes 27 sec      PT SHORT TERM GOAL #4   Title  Pt will complete balance screen and set goal if appropriate    Status  Achieved        PT Long Term Goals - 06/27/18 1457      PT LONG TERM GOAL #1   Title  Pt will be I with HEP as of last visit, strengthening and posture.  Status  On-going      PT LONG TERM GOAL #2   Title  Pt will be able to shop , run errands for 30 min at a time with pain in legs minimal (less than 3/10)      PT LONG TERM GOAL #3   Title  Pt will understand and demo safe body mechanics for lifting, transfers    Status  On-going      PT LONG TERM GOAL #4   Title  Pt will be able to improve sit to stand time from a slightly higher chair to less than 20 sec. no UE assist     Status  On-going      PT LONG TERM GOAL #5   Title  Pt will be able to demo increased hip strength to 4/5 or better in all planes     Status  On-going            Plan - 06/27/18 1432    Clinical Impression Statement  Pt stressed today.  FOTO score met, 41% as predicted. She only has pain now if she is out all day running errands.  She has none during her normal ADLs and mobility.  She is doing well overall but may decide to cont to work on more specific goals.     PT Treatment/Interventions  ADLs/Self Care Home Management;Cryotherapy;Ultrasound;Balance training;Orthotic Fit/Training;Moist Heat;Traction;Gait training;Electrical Stimulation;DME Instruction;Therapeutic exercise;Therapeutic activities;Functional mobility training;Neuromuscular re-education;Patient/family education;Manual techniques;Passive range of motion;Taping    PT Next Visit Plan  check goals, DC vs renew,  standing posture, wall ex, NuStep, hip and core strength     PT Home Exercise Plan  LTR, hamstring stretch, sit to stand, body mechanics, seated marching, bridging and clam with band , sidelying    Consulted and Agree with Plan of Care  Patient       Patient will benefit from skilled therapeutic intervention in order to improve the following deficits and impairments:  Abnormal gait, Decreased balance, Decreased endurance, Decreased mobility, Difficulty walking, Hypomobility, Other (comment), Obesity, Improper body mechanics, Pain, Postural dysfunction, Impaired UE functional use, Impaired flexibility, Increased fascial restricitons, Decreased strength, Decreased activity tolerance, Decreased range of motion  Visit Diagnosis: Muscle weakness (generalized)  Radiculopathy, lumbar region  Pain in left leg  Pain in right leg     Problem List Patient Active Problem List   Diagnosis Date Noted  . Bilateral low back pain 03/17/2018  . Gout 12/21/2017  . Lobar pneumonia ([place]) 12/10/2017  . Paroxysmal atrial fibrillation ([place]) 12/10/2017  . AKI (acute kidney injury) ([place]) 12/10/2017  . Prediabetes 02/05/2016  . History of colonic polyps 10/22/2014  . 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 Vitamin D deficiency 10/31/2012  . ECZEMA 03/03/2010  . 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 Hyperlipidemia 08/02/2007  . Essential hypertension 08/02/2007  . Osteopenia 08/02/2007    Burgundy Matuszak 06/27/2018, 3:04 PM  [facility] [address] [place], [place], [address] Phone: [phone]   Fax:  [phone]  Name: Sharon Knight MRN: [id number] Date of Birth: November 18, 1945  Raeford Razor, PT 06/27/18 3:04 PM Phone: [phone] Fax: [phone]

## 2018-06-28 ENCOUNTER — Ambulatory Visit (INDEPENDENT_AMBULATORY_CARE_PROVIDER_SITE_OTHER): Payer: Medicare Other | Admitting: Pharmacist Clinician (PhC)/ Clinical Pharmacy Specialist

## 2018-06-28 DIAGNOSIS — I10 Essential (primary) hypertension: Secondary | ICD-10-CM | POA: Diagnosis not present

## 2018-06-28 NOTE — Progress Notes (Signed)
07/19/2018 Sharon Knight 25-Oct-1945 [id number]   HPI:  Sharon Knight is a 73 y.o. female patient of Dr Oval Linsey, with a [place] below who presents today for hypertension clinic evaluation.  In addition to hypertension, her medical history is significant for hyperlipidemia, atrial fibrillation and prior right nephrectomy.  Her atrial fibrillation was being treated with metoprolol and flecainide, however she was reporting bradycardia and dizziness, so the metoprolol has been switched to diltiazem.  At her last visit with Dr. Oval Linsey her blood pressure was elevated and valsartan 80 mg daily was added.  A metabolic panel done shortly after starting showed no significant change.    Today she is in the office for follow up.  She has no complaints about her blood pressure medications.  She feels that she may be having some memory loss with the statin drugs, but is continuing for now.    Blood Pressure Goal:  130/80  Current Medications:  Diltiazem 180 mg qd  HCTZ 25 mg qd  Valsartan 80 mg qd  Family Hx:  No family history - was not raised by biological family  Social Hx:  Quit tobacco 30+ years; 6 oz beers - drinks couple each week; drinks teas, mostly "fizzy" water  Diet:  Mostly at home, does pick up [employer], [employer] or [employer] salads; admits to liking pizza - once monthly;  Plenty of beans, some veggies (fresh and frozen);   Exercise:  Therapy daily at home, goes twice weekly now and daily home exercises  Home BP readings:  Not checking, does have cuff  Intolerances:   No known cardiac intolerances  Labs:  06/02/2018:    Na 141, K 4.7, Glu 73, BUN 18, SCr 1.03  04/29/2018:  Na 139, K 4.1, Glu 73, BUN 18, SCR 0.96 Wt Readings from Last 3 Encounters:  06/28/18 167 lb 6.4 oz (75.9 kg)  05/26/18 165 lb 12.8 oz (75.2 kg)  05/16/18 167 lb (75.8 kg)   BP Readings from Last 3 Encounters:  06/28/18 130/78  05/26/18 (!) 142/78  05/16/18 140/72   Pulse Readings from Last 3 Encounters:    06/28/18 67  05/26/18 65  05/16/18 76    Current Outpatient Medications  Medication Sig Dispense Refill  . acetaminophen (TYLENOL) 500 MG tablet Take 500 mg by mouth every 8 (eight) hours as needed for mild pain.    Marland Kitchen atorvastatin (LIPITOR) 20 MG tablet Take 1 tablet (20 mg total) by mouth daily. 90 tablet 3  . diltiazem (CARDIZEM CD) 180 MG 24 hr capsule TAKE (1) CAPSULE DAILY. 30 capsule 5  . Docusate Sodium (STOOL SOFTENER LAXATIVE PO) Take 3-4 capsules by mouth daily.    Marland Kitchen ELIQUIS 5 MG TABS tablet TAKE 1 TABLET BY MOUTH TWICE DAILY. 60 tablet 5  . flecainide (TAMBOCOR) 100 MG tablet TAKE 1 TABLET BY MOUTH TWICE DAILY. 60 tablet 10  . hydrochlorothiazide (HYDRODIURIL) 25 MG tablet Take 1 tablet (25 mg total) by mouth daily. 90 tablet 3  . Multiple Vitamin (MULTIVITAMIN) tablet Take 1 tablet by mouth daily.    . valsartan (DIOVAN) 80 MG tablet Take 1 tablet (80 mg total) by mouth daily. 90 tablet 1   No current facility-administered medications for this visit.     Allergies  Allergen Reactions  . Ibuprofen Other (See Comments)    Dizziness "made me woozy"    Past Medical History:  Diagnosis Date  . Arthritis   . Bilateral cataracts    Dr Kathrin Penner  . Hyperlipidemia   .  Hypertension     Blood pressure 130/78, pulse 67, resp. rate 15, height 5\' 6"  (1.676 m), weight 167 lb 6.4 oz (75.9 kg), SpO2 99 %.  Essential hypertension Patient with essential hypertension, currently doing well.  Will ask that she continue to monitor home blood pressure readings and let our office know should they start to average > 982 systolic.     Tommy Medal PharmD CPP [facility] [address] [place], [place] [address] [phone]

## 2018-06-28 NOTE — Patient Instructions (Signed)
Call if you have any concerns or see your readings elevated to > 140/90  Your blood pressure today is 130/78  (goal is < 130/80)  Check your blood pressure at home 2-3 times per week and keep record of the readings.  Take your BP meds as follows:  Continue with all your current medications  Bring all of your meds, your BP cuff and your record of home blood pressures to your next appointment.  Exercise as you're able, try to walk approximately 30 minutes per day.  Keep salt intake to a minimum, especially watch canned and prepared boxed foods.  Eat more fresh fruits and vegetables and fewer canned items.  Avoid eating in fast food restaurants.    HOW TO TAKE YOUR BLOOD PRESSURE: . Rest 5 minutes before taking your blood pressure. .  Don't smoke or drink caffeinated beverages for at least 30 minutes before. . Take your blood pressure before (not after) you eat. . Sit comfortably with your back supported and both feet on the floor (don't cross your legs). . Elevate your arm to heart level on a table or a desk. . Use the proper sized cuff. It should fit smoothly and snugly around your bare upper arm. There should be enough room to slip a fingertip under the cuff. The bottom edge of the cuff should be 1 inch above the crease of the elbow. . Ideally, take 3 measurements at one sitting and record the average.

## 2018-06-30 ENCOUNTER — Ambulatory Visit: Payer: Medicare Other | Admitting: Physical Therapy

## 2018-06-30 ENCOUNTER — Encounter: Payer: Self-pay | Admitting: Physical Therapy

## 2018-06-30 DIAGNOSIS — M5416 Radiculopathy, lumbar region: Secondary | ICD-10-CM

## 2018-06-30 DIAGNOSIS — M6281 Muscle weakness (generalized): Secondary | ICD-10-CM | POA: Diagnosis not present

## 2018-06-30 DIAGNOSIS — M79604 Pain in right leg: Secondary | ICD-10-CM

## 2018-06-30 DIAGNOSIS — M79605 Pain in left leg: Secondary | ICD-10-CM | POA: Diagnosis not present

## 2018-07-01 NOTE — Therapy (Signed)
[place], [place], [address] Phone: [phone]   Fax:  [phone]  Physical Therapy Treatment  Patient Details  Name: Sharon Knight MRN: [id number] Date of Birth: 09-01-1945 Referring Provider (PT): Dr. Celso Amy    Encounter Date: 06/30/2018  PT End of Session - 07/01/18 0814    Visit Number  11    Number of Visits  16    Date for PT Re-Evaluation  07/22/18    Authorization Type  BC[employer] blue MCR     PT Start Time  1415    PT Stop Time  1457    PT Time Calculation (min)  42 min    Activity Tolerance  Patient tolerated treatment well    Behavior During Therapy  [facility] for tasks assessed/performed       Past Medical History:  Diagnosis Date  . Arthritis   . Bilateral cataracts    Dr Kathrin Penner  . Hyperlipidemia   . Hypertension     Past Surgical History:  Procedure Laterality Date  . COLONOSCOPY     Dr Fuller Plan  . 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 OVARIAN CYST REMOVAL      There were no vitals filed for this visit.  Subjective Assessment - 07/01/18 0813    Subjective  No pain today. She has been doing a lot of running around. She has some pain in her knees at times.     Pertinent History  pneumonia ,atrial fibrillation, gout     Limitations  Sitting;Standing;Walking;Other (comment);House hold activities;Lifting    How long can you sit comfortably?  depends on the seat     How long can you stand comfortably?  15 min     How long can you walk comfortably?  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  No/denies                       [facility] Adult PT Treatment/Exercise - 07/01/18 0001      Lumbar Exercises: Stretches   Lower Trunk Rotation  10 seconds    Lower Trunk  Rotation Limitations  x 10     Pelvic Tilt  10 reps    Piriformis Stretch  2 reps;30 seconds      Lumbar Exercises: Aerobic   Nustep  LE and UE for 5 min , L 6       Lumbar Exercises: Standing   Row Limitations  x15     Shoulder Extension Limitations  x15     Other Standing Lumbar Exercises  wide tandem alternating arms for core x 10 each side     Other Standing Lumbar Exercises  facing front alternating punch slastix x 10       Lumbar Exercises: Seated   Sit to Stand  10 reps    Sit to Stand Limitations  min knee pain       Lumbar Exercises: Supine   Clam  10 reps    Clam Limitations  ab set prior    Bridge  20 reps    Other Supine Lumbar Exercises  shoulder flexion overhead  cane x 10       Lumbar Exercises: Sidelying  Clam  Both;15 reps    Other Sidelying Lumbar Exercises  upper trunk rotation x 5 each side              PT Education - 07/01/18 0813    Education Details  technique with ther-ex     Methods  Explanation;Demonstration;Tactile cues;Verbal cues    Comprehension  Verbalized understanding;Returned demonstration;Verbal cues required;Tactile cues required       PT Short Term Goals - 06/16/18 1416      PT SHORT TERM GOAL #1   Title  Pt will be I with HEP for trunk, LE strength    Status  Achieved      PT SHORT TERM GOAL #2   Title  Pt will be able to stand from bed in the night with less pain overall improved 25%    Baseline  still stiff and sore, but no pain as she gets to the bathroom     Status  Partially Met      PT SHORT TERM GOAL #3   Title  Pt will be able to sit to stand without UE assist from standard chair x 5    Baseline  needs UE OR seat high, takes 27 sec      PT SHORT TERM GOAL #4   Title  Pt will complete balance screen and set goal if appropriate    Status  Achieved        PT Long Term Goals - 06/27/18 1457      PT LONG TERM GOAL #1   Title  Pt will be I with HEP as of last visit, strengthening and posture.     Status   On-going      PT LONG TERM GOAL #2   Title  Pt will be able to shop , run errands for 30 min at a time with pain in legs minimal (less than 3/10)      PT LONG TERM GOAL #3   Title  Pt will understand and demo safe body mechanics for lifting, transfers    Status  On-going      PT LONG TERM GOAL #4   Title  Pt will be able to improve sit to stand time from a slightly higher chair to less than 20 sec. no UE assist     Status  On-going      PT LONG TERM GOAL #5   Title  Pt will be able to demo increased hip strength to 4/5 or better in all planes     Status  On-going            Plan - 07/01/18 0815    Clinical Impression Statement  Patient tolerated ther-ex well. She had no increase in back pain. At times she has increases in bilateral knee pain. She is making good progress. She feels like she is comfortable with her home exercises. She will likely D/C next visit.     Clinical Presentation  Evolving    Clinical Decision Making  Moderate    Rehab Potential  Good    PT Frequency  2x / week    PT Duration  8 weeks    PT Treatment/Interventions  ADLs/Self Care Home Management;Cryotherapy;Ultrasound;Balance training;Orthotic Fit/Training;Moist Heat;Traction;Gait training;Electrical Stimulation;DME Instruction;Therapeutic exercise;Therapeutic activities;Functional mobility training;Neuromuscular re-education;Patient/family education;Manual techniques;Passive range of motion;Taping    PT Next Visit Plan  check goals, DC vs renew, standing posture, wall ex, NuStep, hip and core strength     PT Home Exercise Plan  LTR, hamstring  stretch, sit to stand, body mechanics, seated marching, bridging and clam with band , sidelying    Consulted and Agree with Plan of Care  Patient       Patient will benefit from skilled therapeutic intervention in order to improve the following deficits and impairments:  Abnormal gait, Decreased balance, Decreased endurance, Decreased mobility, Difficulty walking,  Hypomobility, Other (comment), Obesity, Improper body mechanics, Pain, Postural dysfunction, Impaired UE functional use, Impaired flexibility, Increased fascial restricitons, Decreased strength, Decreased activity tolerance, Decreased range of motion  Visit Diagnosis: Muscle weakness (generalized)  Radiculopathy, lumbar region  Pain in left leg  Pain in right leg     Problem List Patient Active Problem List   Diagnosis Date Noted  . Bilateral low back pain 03/17/2018  . Gout 12/21/2017  . Lobar pneumonia ([place]) 12/10/2017  . Paroxysmal atrial fibrillation ([place]) 12/10/2017  . AKI (acute kidney injury) ([place]) 12/10/2017  . Prediabetes 02/05/2016  . History of colonic polyps 10/22/2014  . 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 Vitamin D deficiency 10/31/2012  . ECZEMA 03/03/2010  . 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 Hyperlipidemia 08/02/2007  . Essential hypertension 08/02/2007  . Osteopenia 08/02/2007    Carney Living PT DPT  07/01/2018, 8:20 AM  [facility] [address] [place], [place], [address] Phone: [phone]   Fax:  [phone]  Name: Sharon Knight MRN: [id number] Date of Birth: May 22, 1945

## 2018-07-05 ENCOUNTER — Encounter: Payer: Self-pay | Admitting: Physical Therapy

## 2018-07-05 ENCOUNTER — Ambulatory Visit: Payer: Medicare Other | Attending: Internal Medicine | Admitting: Physical Therapy

## 2018-07-05 DIAGNOSIS — M5416 Radiculopathy, lumbar region: Secondary | ICD-10-CM | POA: Insufficient documentation

## 2018-07-05 DIAGNOSIS — M79605 Pain in left leg: Secondary | ICD-10-CM | POA: Diagnosis not present

## 2018-07-05 DIAGNOSIS — M79604 Pain in right leg: Secondary | ICD-10-CM

## 2018-07-05 DIAGNOSIS — M6281 Muscle weakness (generalized): Secondary | ICD-10-CM | POA: Diagnosis not present

## 2018-07-05 NOTE — Therapy (Signed)
[place], [place], [address] Phone: [phone]   Fax:  [phone]  Physical Therapy Treatment  Patient Details  Name: Sharon Knight MRN: [id number] Date of Birth: 02/23/1946 Referring Provider (PT): Dr. Celso Amy    Encounter Date: 07/05/2018  PT End of Session - 07/05/18 1559    Visit Number  12    Number of Visits  16    Date for PT Re-Evaluation  07/22/18    Authorization Type  BCBS blue MCR     PT Start Time  1550    PT Stop Time  1633    PT Time Calculation (min)  43 min    Activity Tolerance  Patient tolerated treatment well    Behavior During Therapy  [facility] for tasks assessed/performed       Past Medical History:  Diagnosis Date  . Arthritis   . Bilateral cataracts    Dr Kathrin Penner  . Hyperlipidemia   . Hypertension     Past Surgical History:  Procedure Laterality Date  . COLONOSCOPY     Dr Fuller Plan  . 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 OVARIAN CYST REMOVAL      There were no vitals filed for this visit.  Subjective Assessment - 07/05/18 1553    Subjective  Had a lot of trouble yesterday , knee is giving me a fit today. No back pain right now. Sometimes it hurts when I get up at night.  It doesnt stay.     Currently in Pain?  No/denies       [facility] Adult PT Treatment/Exercise - 07/05/18 0001      Lumbar Exercises: Stretches   Gastroc Stretch  Right;Left;5 reps      Lumbar Exercises: Aerobic   Nustep  LE and UE for 5 min , L 6       Lumbar Exercises: Standing   Row  Strengthening;20 reps;Theraband    Theraband Level (Row)  Level 3 (Green)    Shoulder Extension  Strengthening;Both;20 reps;Theraband    Theraband Level (Shoulder Extension)  Level 3 (Green)      Lumbar Exercises: Seated   Long Arc Quad on Chair  Strengthening;Both;1 set;20 reps      Lumbar Exercises: Supine   Clam  20 reps    Clam Limitations  blue band then x 10 unilateral each side     Bent  Knee Raise  10 reps    Bent Knee Raise Limitations  knee pain (blue band )     Bridge  10 reps               PT Short Term Goals - 06/16/18 1416      PT SHORT TERM GOAL #1   Title  Pt will be I with HEP for trunk, LE strength    Status  Achieved      PT SHORT TERM GOAL #2   Title  Pt will be able to stand from bed in the night with less pain overall improved 25%    Baseline  still stiff and sore, but no pain as she gets to the bathroom     Status  Partially Met      PT SHORT TERM GOAL #3   Title  Pt will be able to sit to stand without UE assist from standard chair x 5    Baseline  needs UE OR seat high, takes 27 sec  PT SHORT TERM GOAL #4   Title  Pt will complete balance screen and set goal if appropriate    Status  Achieved        PT Long Term Goals - 06/27/18 1457      PT LONG TERM GOAL #1   Title  Pt will be I with HEP as of last visit, strengthening and posture.     Status  On-going      PT LONG TERM GOAL #2   Title  Pt will be able to shop , run errands for 30 min at a time with pain in legs minimal (less than 3/10)      PT LONG TERM GOAL #3   Title  Pt will understand and demo safe body mechanics for lifting, transfers    Status  On-going      PT LONG TERM GOAL #4   Title  Pt will be able to improve sit to stand time from a slightly higher chair to less than 20 sec. no UE assist     Status  On-going      PT LONG TERM GOAL #5   Title  Pt will be able to demo increased hip strength to 4/5 or better in all planes     Status  On-going            Plan - 07/05/18 1626    Clinical Impression Statement  Patient had increased Rt knee pain today, limited in what she could do in standing.  She wishes to finish POC at her next appt.  She has not had much back pain at all lately.  She is benefitting from postural strength and stretching. Encouraging her to maintain her HEP and consider a Senior exercise group/[employer]?     PT Treatment/Interventions   ADLs/Self Care Home Management;Cryotherapy;Ultrasound;Balance training;Orthotic Fit/Training;Moist Heat;Traction;Gait training;Electrical Stimulation;DME Instruction;Therapeutic exercise;Therapeutic activities;Functional mobility training;Neuromuscular re-education;Patient/family education;Manual techniques;Passive range of motion;Taping    PT Next Visit Plan  check goals, DC , FOTO standing posture, wall ex, NuStep, hip and core strength     PT Home Exercise Plan  LTR, hamstring stretch, sit to stand, body mechanics, seated marching, bridging and clam with band , sidelying    Consulted and Agree with Plan of Care  Patient       Patient will benefit from skilled therapeutic intervention in order to improve the following deficits and impairments:  Abnormal gait, Decreased balance, Decreased endurance, Decreased mobility, Difficulty walking, Hypomobility, Other (comment), Obesity, Improper body mechanics, Pain, Postural dysfunction, Impaired UE functional use, Impaired flexibility, Increased fascial restricitons, Decreased strength, Decreased activity tolerance, Decreased range of motion  Visit Diagnosis: Muscle weakness (generalized)  Radiculopathy, lumbar region  Pain in left leg  Pain in right leg     Problem List Patient Active Problem List   Diagnosis Date Noted  . Bilateral low back pain 03/17/2018  . Gout 12/21/2017  . Lobar pneumonia ([place]) 12/10/2017  . Paroxysmal atrial fibrillation ([place]) 12/10/2017  . AKI (acute kidney injury) ([place]) 12/10/2017  . Prediabetes 02/05/2016  . History of colonic polyps 10/22/2014  . 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 Vitamin D deficiency 10/31/2012  . ECZEMA 03/03/2010  . 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 Hyperlipidemia 08/02/2007  . Essential hypertension 08/02/2007  . Osteopenia 08/02/2007    PAA,JENNIFER 07/05/2018, 4:29 PM  [facility] [address] [place],  [place], [address] Phone: [phone]   Fax:  [phone]  Name: Donnalyn Juran MRN: [id number]  Date of Birth: 08-11-1945  Raeford Razor, PT 07/05/18 4:29 PM Phone: [phone] Fax: [phone]

## 2018-07-07 ENCOUNTER — Encounter: Payer: Self-pay | Admitting: Physical Therapy

## 2018-07-07 ENCOUNTER — Ambulatory Visit: Payer: Medicare Other | Admitting: Physical Therapy

## 2018-07-07 DIAGNOSIS — M5416 Radiculopathy, lumbar region: Secondary | ICD-10-CM

## 2018-07-07 DIAGNOSIS — M79604 Pain in right leg: Secondary | ICD-10-CM | POA: Diagnosis not present

## 2018-07-07 DIAGNOSIS — M79605 Pain in left leg: Secondary | ICD-10-CM | POA: Diagnosis not present

## 2018-07-07 DIAGNOSIS — M6281 Muscle weakness (generalized): Secondary | ICD-10-CM | POA: Diagnosis not present

## 2018-07-07 NOTE — Therapy (Signed)
[place], [place], [address] Phone: [phone]   Fax:  [phone]  Physical Therapy Treatment/Discharge  Patient Details  Name: Sharon Knight MRN: [id number] Date of Birth: 09-11-1945 Referring Provider (PT): Dr. Celso Amy    Encounter Date: 07/07/2018  PT End of Session - 07/07/18 1419    Visit Number  13    Number of Visits  16    Date for PT Re-Evaluation  07/22/18    Authorization Type  BCBS blue MCR     PT Start Time  1417    PT Stop Time  1500    PT Time Calculation (min)  43 min    Activity Tolerance  Patient tolerated treatment well    Behavior During Therapy  [facility] for tasks assessed/performed       Past Medical History:  Diagnosis Date  . Arthritis   . Bilateral cataracts    Dr Kathrin Penner  . Hyperlipidemia   . Hypertension     Past Surgical History:  Procedure Laterality Date  . COLONOSCOPY     Dr Fuller Plan  . 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 OVARIAN CYST REMOVAL      There were no vitals filed for this visit.  Subjective Assessment - 07/07/18 1418    Subjective  Its one of those days.          [facility] PT Assessment - 07/07/18 0001      Observation/Other Assessments   Focus on Therapeutic Outcomes (FOTO)   41%      Strength   Right Hip Flexion  4+/5    Right Hip ABduction  4-/5    Left Hip Flexion  4+/5    Left Hip ABduction  4/5    Right Knee Flexion  5/5    Right Knee Extension  5/5    Left Knee Flexion  5/5    Left Knee Extension  5/5          [facility] Adult PT Treatment/Exercise - 07/07/18 0001      Dynamic Gait Index   Level Surface  Mild Impairment    Change in Gait Speed  Normal    Gait with Horizontal Head Turns  Moderate Impairment    Gait with Vertical Head Turns  Moderate Impairment    Gait and Pivot Turn  Mild Impairment    Step Over Obstacle  Mild Impairment    Step Around Obstacles  Mild Impairment    Steps  Mild Impairment    Total  Score  15      Lumbar Exercises: Stretches   Active Hamstring Stretch  3 reps;30 seconds    Lower Trunk Rotation  10 seconds    Lower Trunk Rotation Limitations  x 10     ITB Stretch  3 reps;30 seconds      Lumbar Exercises: Aerobic   Nustep  LE and UE for 5 min , L 6       Lumbar Exercises: Standing   Row  Strengthening;20 reps;Theraband    Theraband Level (Row)  Level 3 (Green)    Shoulder Extension  Strengthening;Both;20 reps;Theraband    Theraband Level (Shoulder Extension)  Level 3 (Green)      Lumbar Exercises: Sidelying   Hip Abduction  Both;10 reps    Hip Abduction Weights (lbs)  knee and hip pain              PT Education - 07/07/18 1452  Education Details  HEP, DC (ITB), balance     Person(s) Educated  Patient    Methods  Explanation;Handout    Comprehension  Verbalized understanding       PT Short Term Goals - 07/07/18 1422      PT SHORT TERM GOAL #1   Title  Pt will be I with HEP for trunk, LE strength    Status  Achieved      PT SHORT TERM GOAL #2   Title  Pt will be able to stand from bed in the night with less pain overall improved 25%    Baseline  at least 50%     Status  Achieved      PT SHORT TERM GOAL #3   Title  Pt will be able to sit to stand without UE assist from standard chair x 5    Baseline  needs UE today , knee pain     Status  Unable to assess      PT SHORT TERM GOAL #4   Title  Pt will complete balance screen and set goal if appropriate    Baseline  pain in knees today, declined         PT Long Term Goals - 07/07/18 1424      PT LONG TERM GOAL #1   Title  Pt will be I with HEP as of last visit, strengthening and posture.     Status  Achieved      PT LONG TERM GOAL #2   Title  Pt will be able to shop , run errands for 30 min at a time with pain in legs minimal (less than 3/10)    Status  Achieved      PT LONG TERM GOAL #3   Title  Pt will understand and demo safe body mechanics for lifting, transfers    Status   Achieved      PT LONG TERM GOAL #4   Title  Pt will be able to improve sit to stand time from a slightly higher chair to less than 20 sec. no UE assist     Baseline  wanted to skip today as she has knee pain and needs UE assist, did not wear her brace     Status  Unable to assess      PT LONG TERM GOAL #5   Title  Pt will be able to demo increased hip strength to 4/5 or better in all planes     Baseline  4-/5 to 5/5     Status  Achieved            Plan - 07/07/18 1941    Clinical Impression Statement  Patient is pleased with her resolution of back and LE symptoms. She is dealing with more Rt knee pain. She is I with HEP and will continue. 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     PT Treatment/Interventions  ADLs/Self Care Home Management;Cryotherapy;Ultrasound;Balance training;Orthotic Fit/Training;Moist Heat;Traction;Gait training;Electrical Stimulation;DME Instruction;Therapeutic exercise;Therapeutic activities;Functional mobility training;Neuromuscular re-education;Patient/family education;Manual techniques;Passive range of motion;Taping    PT Next Visit Plan  NA , DC    PT Home Exercise Plan  LTR, hamstring stretch, sit to stand, body mechanics, seated marching, bridging and clam with band , sidelying    Consulted and Agree with Plan of Care  Patient       Patient will benefit from skilled therapeutic intervention in order to improve the following deficits  and impairments:  Abnormal gait, Decreased balance, Decreased endurance, Decreased mobility, Difficulty walking, Hypomobility, Other (comment), Obesity, Improper body mechanics, Pain, Postural dysfunction, Impaired UE functional use, Impaired flexibility, Increased fascial restricitons, Decreased strength, Decreased activity tolerance, Decreased range of motion  Visit Diagnosis: Muscle weakness (generalized)  Radiculopathy, lumbar region  Pain in left leg  Pain in  right leg     Problem List Patient Active Problem List   Diagnosis Date Noted  . Bilateral low back pain 03/17/2018  . Gout 12/21/2017  . Lobar pneumonia ([place]) 12/10/2017  . Paroxysmal atrial fibrillation ([place]) 12/10/2017  . AKI (acute kidney injury) ([place]) 12/10/2017  . Prediabetes 02/05/2016  . History of colonic polyps 10/22/2014  . 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 Vitamin D deficiency 10/31/2012  . ECZEMA 03/03/2010  . 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 Hyperlipidemia 08/02/2007  . Essential hypertension 08/02/2007  . Osteopenia 08/02/2007    Sharon Knight 07/07/2018, 7:44 PM  [place] [address] [place], [place], [address] Phone: [phone]   Fax:  [phone]  Name: Sharon Knight MRN: [id number] Date of Birth: 08-02-1945  Raeford Razor, PT 07/07/18 7:44 PM Phone: [phone] Fax: [phone]   PHYSICAL THERAPY DISCHARGE SUMMARY  Visits from Start of Care: 13  Current functional level related to goals / functional outcomes: See above    Remaining deficits: Posture, hip and core strength (although much improved!) and endurance    Education / Equipment: HEP, core, body mechanics , posture  Plan: Patient agrees to discharge.  Patient goals were met. Patient is being discharged due to being pleased with the current functional level.  ?????    Raeford Razor, PT 07/07/18 7:45 PM Phone: [phone] Fax: [phone]

## 2018-07-07 NOTE — Patient Instructions (Signed)
Outer Hip Stretch: Reclined IT Band Stretch (Strap)    Strap around opposite foot, pull across only as far as possible with shoulders on mat. Hold for ___3_ breaths. Repeat __2-3__ times each leg.  Copyright  [employer]. All rights reserved.

## 2018-07-19 NOTE — Assessment & Plan Note (Signed)
Patient with essential hypertension, currently doing well.  Will ask that she continue to monitor home blood pressure readings and let our office know should they start to average > 284 systolic.

## 2018-07-29 DIAGNOSIS — H02054 Trichiasis without entropian left upper eyelid: Secondary | ICD-10-CM | POA: Diagnosis not present

## 2018-07-29 DIAGNOSIS — H4312 Vitreous hemorrhage, left eye: Secondary | ICD-10-CM | POA: Diagnosis not present

## 2018-08-01 DIAGNOSIS — H35013 Changes in retinal vascular appearance, bilateral: Secondary | ICD-10-CM | POA: Diagnosis not present

## 2018-08-01 DIAGNOSIS — H33102 Unspecified retinoschisis, left eye: Secondary | ICD-10-CM | POA: Diagnosis not present

## 2018-08-01 DIAGNOSIS — H43812 Vitreous degeneration, left eye: Secondary | ICD-10-CM | POA: Diagnosis not present

## 2018-08-01 DIAGNOSIS — H4312 Vitreous hemorrhage, left eye: Secondary | ICD-10-CM | POA: Diagnosis not present

## 2018-08-22 ENCOUNTER — Other Ambulatory Visit: Payer: Self-pay | Admitting: Cardiovascular Disease

## 2018-08-22 NOTE — Telephone Encounter (Signed)
Valsartan 80 mg refilled.

## 2018-09-07 ENCOUNTER — Other Ambulatory Visit: Payer: Self-pay | Admitting: Internal Medicine

## 2018-09-14 NOTE — Progress Notes (Signed)
Subjective:    Patient ID: Sharon Knight, female    DOB: May 11, 1945, 73 y.o.   MRN: [id number]  HPI The patient is here for follow up.  She is still doing the PT exercises for her back pain.    Afib, Hypertension: She is taking her medication daily. She is compliant with a low sodium diet.  She denies chest pain, palpitations, edema, shortness of breath and regular headaches. She does not monitor her blood pressure at home.    Prediabetes:  She is compliant with a low sugar/carbohydrate diet.  She is exercising regularly.  Hyperlipidemia: She is taking her medication daily. She is compliant with a low fat/cholesterol diet. She denies myalgias.     Medications and allergies reviewed with patient and updated if appropriate.  Patient Active Problem List   Diagnosis Date Noted  . Bilateral low back pain 03/17/2018  . Gout 12/21/2017  . Lobar pneumonia ([place]) 12/10/2017  . Paroxysmal atrial fibrillation ([place]) 12/10/2017  . AKI (acute kidney injury) ([place]) 12/10/2017  . Prediabetes 02/05/2016  . History of colonic polyps 10/22/2014  . 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 Vitamin D deficiency 10/31/2012  . ECZEMA 03/03/2010  . 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 Hyperlipidemia 08/02/2007  . Essential hypertension 08/02/2007  . Osteopenia 08/02/2007    Current Outpatient Medications on File Prior to Visit  Medication Sig Dispense Refill  . acetaminophen (TYLENOL) 500 MG tablet Take 500 mg by mouth every 8 (eight) hours as needed for mild pain.    Marland Kitchen apixaban (ELIQUIS) 5 MG TABS tablet Take 1 tablet (5 mg total) by mouth 2 (two) times daily. Need follow up for more refills. 60 tablet 0  . atorvastatin (LIPITOR) 20 MG tablet Take 1 tablet (20 mg total) by mouth daily. 90 tablet 3  . diltiazem (CARDIZEM CD) 180 MG 24 hr capsule TAKE (1) CAPSULE DAILY. 30 capsule 5  . Docusate Sodium (STOOL SOFTENER LAXATIVE PO) Take 3-4 capsules by mouth daily.    . flecainide  (TAMBOCOR) 100 MG tablet TAKE 1 TABLET BY MOUTH TWICE DAILY. 60 tablet 10  . hydrochlorothiazide (HYDRODIURIL) 25 MG tablet Take 1 tablet (25 mg total) by mouth daily. 90 tablet 3  . Multiple Vitamin (MULTIVITAMIN) tablet Take 1 tablet by mouth daily.    . valsartan (DIOVAN) 80 MG tablet TAKE 1 TABLET EACH DAY. 90 tablet 0   No current facility-administered medications on file prior to visit.     Past Medical History:  Diagnosis Date  . Arthritis   . Bilateral cataracts    Dr Kathrin Penner  . Hyperlipidemia   . Hypertension     Past Surgical History:  Procedure Laterality Date  . COLONOSCOPY     Dr Fuller Plan  . 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 OVARIAN CYST REMOVAL      Social History   Socioeconomic History  . Marital status: Married    Spouse name: Not on file  . Number of children: Not on file  . Years of education: Not on file  . Highest education level: Not on file  Occupational History  . Not on file  Social Needs  . Financial resource strain: Not on file  . Food insecurity:    Worry: Not on file    Inability: Not on file  . Transportation needs:    Medical: Not on file    Non-medical: Not on file  Tobacco Use  . Smoking status: Former  Smoker    Last attempt to quit: 08/27/1984    Years since quitting: 34.0  . Smokeless tobacco: Never Used  . Tobacco comment: smoked age [age], up to 1.5-2 ppd  Substance and Sexual Activity  . Alcohol use: Yes    Alcohol/week: 3.0 standard drinks    Types: 3 Cans of beer per week    Comment: 3 x weekly  . Drug use: No  . Sexual activity: Not on file  Lifestyle  . Physical activity:    Days per week: Patient refused    Minutes per session: Patient refused  . Stress: Not on file  Relationships  . Social connections:    Talks on phone: Not on file    Gets together: Not on file    Attends religious service: Not on file    Active member of club or organization: Not on file    Attends meetings of clubs or  organizations: Not on file    Relationship status: Not on file  Other Topics Concern  . Not on file  Social History Narrative   Exercise: none    Family History  Adopted: Yes  Problem Relation Age of Onset  . Colon cancer Neg Hx   . Stomach cancer Neg Hx     Review of Systems  Constitutional: Negative for chills and fever.  Respiratory: Negative for cough, shortness of breath and wheezing.   Cardiovascular: Negative for chest pain, palpitations and leg swelling.  Musculoskeletal: Positive for back pain and joint swelling.  Neurological: Negative for light-headedness and headaches.       Objective:   Vitals:   09/15/18 1053  BP: 130/72  Pulse: 66  Resp: 16  Temp: 98.6 F (37 C)  SpO2: 97%   BP Readings from Last 3 Encounters:  09/15/18 130/72  06/28/18 130/78  05/26/18 (!) 142/78   Wt Readings from Last 3 Encounters:  09/15/18 170 lb 6.4 oz (77.3 kg)  06/28/18 167 lb 6.4 oz (75.9 kg)  05/26/18 165 lb 12.8 oz (75.2 kg)   Body mass index is 27.5 kg/m.   Physical Exam    Constitutional: Appears well-developed and well-nourished. No distress.  HENT:  Head: Normocephalic and atraumatic.  Neck: Neck supple. No tracheal deviation present. No thyromegaly present.  No cervical lymphadenopathy Cardiovascular: Normal rate, regular rhythm and normal heart sounds.  No murmur heard. No carotid bruit .  No edema Pulmonary/Chest: Effort normal and breath sounds normal. No respiratory distress. No has no wheezes. No rales.  Skin: Skin is warm and dry. Not diaphoretic.  Psychiatric: Normal mood and affect. Behavior is normal.      Assessment & Plan:    See Problem List for Assessment and Plan of chronic medical problems.

## 2018-09-15 ENCOUNTER — Encounter: Payer: Self-pay | Admitting: Internal Medicine

## 2018-09-15 ENCOUNTER — Ambulatory Visit (INDEPENDENT_AMBULATORY_CARE_PROVIDER_SITE_OTHER): Payer: Medicare Other | Admitting: Internal Medicine

## 2018-09-15 ENCOUNTER — Other Ambulatory Visit: Payer: Self-pay

## 2018-09-15 ENCOUNTER — Other Ambulatory Visit (INDEPENDENT_AMBULATORY_CARE_PROVIDER_SITE_OTHER): Payer: Medicare Other

## 2018-09-15 VITALS — BP 130/72 | HR 66 | Temp 98.6°F | Resp 16 | Ht 66.0 in | Wt 170.4 lb

## 2018-09-15 DIAGNOSIS — I10 Essential (primary) hypertension: Secondary | ICD-10-CM

## 2018-09-15 DIAGNOSIS — I480 Paroxysmal atrial fibrillation: Secondary | ICD-10-CM

## 2018-09-15 DIAGNOSIS — E7849 Other hyperlipidemia: Secondary | ICD-10-CM | POA: Diagnosis not present

## 2018-09-15 DIAGNOSIS — E119 Type 2 diabetes mellitus without complications: Secondary | ICD-10-CM | POA: Insufficient documentation

## 2018-09-15 DIAGNOSIS — R7303 Prediabetes: Secondary | ICD-10-CM | POA: Diagnosis not present

## 2018-09-15 LAB — COMPREHENSIVE METABOLIC PANEL
ALT: 14 U/L (ref 0–35)
AST: 17 U/L (ref 0–37)
Albumin: 4.4 g/dL (ref 3.5–5.2)
Alkaline Phosphatase: 92 U/L (ref 39–117)
BUN: 18 mg/dL (ref 6–23)
CO2: 31 mEq/L (ref 19–32)
Calcium: 9.8 mg/dL (ref 8.4–10.5)
Chloride: 102 mEq/L (ref 96–112)
Creatinine, Ser: 1.06 mg/dL (ref 0.40–1.20)
GFR: 50.65 mL/min — ABNORMAL LOW (ref >60.00)
Glucose, Bld: 86 mg/dL (ref 70–99)
Potassium: 4.3 mEq/L (ref 3.5–5.1)
Sodium: 141 mEq/L (ref 135–145)
Total Bilirubin: 0.6 mg/dL (ref 0.2–1.2)
Total Protein: 7.5 g/dL (ref 6.0–8.3)

## 2018-09-15 LAB — CBC WITH DIFFERENTIAL/PLATELET
Basophils Absolute: 0.1 10*3/uL (ref 0.0–0.1)
Basophils Relative: 1.3 % (ref 0.0–3.0)
Eosinophils Absolute: 0.1 10*3/uL (ref 0.0–0.7)
Eosinophils Relative: 1.3 % (ref 0.0–5.0)
HCT: 41.9 % (ref 36.0–46.0)
Hemoglobin: 14.4 g/dL (ref 12.0–15.0)
Lymphocytes Relative: 17.4 % (ref 12.0–46.0)
Lymphs Abs: 1.4 10*3/uL (ref 0.7–4.0)
MCHC: 34.2 g/dL (ref 30.0–36.0)
MCV: 91.5 fl (ref 78.0–100.0)
Monocytes Absolute: 0.9 10*3/uL (ref 0.1–1.0)
Monocytes Relative: 11.1 % (ref 3.0–12.0)
Neutro Abs: 5.5 10*3/uL (ref 1.4–7.7)
Neutrophils Relative %: 68.9 % (ref 43.0–77.0)
Platelets: 293 10*3/uL (ref 150.0–400.0)
RBC: 4.58 Mil/uL (ref 3.87–5.11)
RDW: 12.7 % (ref 11.5–15.5)
WBC: 8 10*3/uL (ref 4.0–10.5)

## 2018-09-15 LAB — LIPID PANEL
Cholesterol: 167 mg/dL (ref 0–200)
HDL: 70.1 mg/dL (ref >39.00)
LDL Cholesterol: 81 mg/dL (ref 0–99)
NonHDL: 97.19
Total CHOL/HDL Ratio: 2
Triglycerides: 79 mg/dL (ref 0.0–149.0)
VLDL: 15.8 mg/dL (ref 0.0–40.0)

## 2018-09-15 LAB — HEMOGLOBIN A1C: Hgb A1c MFr Bld: 6.5 % (ref 4.6–6.5)

## 2018-09-15 NOTE — Assessment & Plan Note (Signed)
Check a1c Low sugar / carb diet Stressed regular exercise   

## 2018-09-15 NOTE — Patient Instructions (Addendum)
  Tests ordered today. Your results will be released to MyChart (or called to you) after review, usually within 72hours after test completion. If any changes need to be made, you will be notified at that same time.  Medications reviewed and updated.  Changes include :   none      Please followup in 6 months   

## 2018-09-15 NOTE — Assessment & Plan Note (Signed)
Asymptomatic, no palpitations Rate controlled Taking Eliquis, flecainide Follows with cardiology CMP, CBC

## 2018-09-15 NOTE — Assessment & Plan Note (Signed)
She is not fasting, but will check lipid panel Continue atorvastatin Continue healthy diet and regular exercise

## 2018-09-15 NOTE — Assessment & Plan Note (Signed)
BP well controlled Current regimen effective and well tolerated Continue current medications at current doses cmp  BP Readings from Last 3 Encounters:  09/15/18 130/72  06/28/18 130/78  05/26/18 (!) 142/78

## 2018-09-16 ENCOUNTER — Telehealth: Payer: Self-pay | Admitting: Cardiovascular Disease

## 2018-09-16 NOTE — Telephone Encounter (Signed)
lmtcb to reschedule appt she has with Dr. Oval Linsey on May 22.  She will probably have to see a PA as Dr. Oval Linsey does not have any opening.  Please reschedule.

## 2018-09-19 DIAGNOSIS — H33322 Round hole, left eye: Secondary | ICD-10-CM | POA: Diagnosis not present

## 2018-09-23 ENCOUNTER — Ambulatory Visit: Payer: Medicare Other | Admitting: Cardiovascular Disease

## 2018-10-07 ENCOUNTER — Other Ambulatory Visit: Payer: Self-pay | Admitting: Internal Medicine

## 2018-10-17 ENCOUNTER — Telehealth: Payer: Self-pay | Admitting: Cardiology

## 2018-10-17 NOTE — Telephone Encounter (Addendum)
LVM for pre reg, my chart pending, no email.

## 2018-10-17 NOTE — Telephone Encounter (Signed)
home phone/ consent/ my chart/ pre reg completed °

## 2018-10-20 ENCOUNTER — Encounter: Payer: Self-pay | Admitting: Cardiology

## 2018-10-20 ENCOUNTER — Telehealth: Payer: Self-pay

## 2018-10-20 ENCOUNTER — Telehealth (INDEPENDENT_AMBULATORY_CARE_PROVIDER_SITE_OTHER): Payer: Medicare Other | Admitting: Cardiology

## 2018-10-20 ENCOUNTER — Other Ambulatory Visit: Payer: Self-pay

## 2018-10-20 ENCOUNTER — Other Ambulatory Visit: Payer: Self-pay | Admitting: General Practice

## 2018-10-20 VITALS — BP 145/75 | HR 68 | Ht 66.0 in | Wt 170.0 lb

## 2018-10-20 DIAGNOSIS — I10 Essential (primary) hypertension: Secondary | ICD-10-CM

## 2018-10-20 DIAGNOSIS — E785 Hyperlipidemia, unspecified: Secondary | ICD-10-CM

## 2018-10-20 DIAGNOSIS — I480 Paroxysmal atrial fibrillation: Secondary | ICD-10-CM | POA: Diagnosis not present

## 2018-10-20 MED ORDER — OLMESARTAN MEDOXOMIL 5 MG PO TABS: 5.0000 mg | ORAL_TABLET | Freq: Every day | ORAL | 2 refills | Status: DC

## 2018-10-20 MED ORDER — VALSARTAN 80 MG PO TABS: 80.0000 mg | ORAL_TABLET | Freq: Every day | ORAL | 3 refills | Status: DC

## 2018-10-20 MED ORDER — ATORVASTATIN CALCIUM 20 MG PO TABS: 10.0000 mg | ORAL_TABLET | Freq: Every day | ORAL | 3 refills | Status: DC

## 2018-10-20 NOTE — Telephone Encounter (Signed)
Contacted patient to discuss AVS Instructions. Gave her Chales Abrahams recommendations from today's office visit. Informed patient that someone from the scheduling dept will be contacting her to schedule their follow up appt. She voiced understanding and AVS mailed to patient.

## 2018-10-20 NOTE — Telephone Encounter (Signed)
Left message for patient to contact office. Denyse Amass the NP she saw has decided that she will need to come to the office next month and have an EKG completed. Advised patient to contact the office to have the appt scheduled.

## 2018-10-20 NOTE — Addendum Note (Signed)
Addended by: Ulice Brilliant T on: 10/20/2018 04:21 PM   Modules accepted: Orders

## 2018-10-20 NOTE — Progress Notes (Signed)
Virtual Visit via Video Note   This visit type was conducted due to national recommendations for restrictions regarding the COVID-19 Pandemic (e.g. social distancing) in an effort to limit this patient's exposure and mitigate transmission in our community.  Due to her co-morbid illnesses, this patient is at least at moderate risk for complications without adequate follow up.  This format is felt to be most appropriate for this patient at this time.  All issues noted in this document were discussed and addressed.  A limited physical exam was performed with this format.  Please refer to the patient's chart for her consent to telehealth for [facility].  Evaluation Performed:  Follow-up visit  This visit type was conducted due to national recommendations for restrictions regarding the COVID-19 Pandemic (e.g. social distancing).  This format is felt to be most appropriate for this patient at this time.  All issues noted in this document were discussed and addressed.  No physical exam was performed (except for noted visual exam findings with Video Visits).  Please refer to the patient's chart (MyChart message for video visits and phone note for telephone visits) for the patient's consent to telehealth for [facility]  Date:  10/20/2018   ID:  Sharon Knight, DOB May 31, 1945, MRN [id number]  Patient Location:  [place] [address]   Provider location:   [facility] [address] [place], [place] [address]   PCP:  Binnie Rail, MD  Cardiologist:  Skeet Latch, MD  Electrophysiologist:  Will Meredith Leeds, MD   Chief Complaint: 54-month follow-up A. fib/hypertension  History of Present Illness:    Sharon Knight is a 73 y.o. female who presents via audio/video conferencing for a telehealth visit today.  Patient verified DOB and address.  PMH of paroxysmal atrial fibrillation, hypertension, hyperlipidemia and right nephrectomy.  Last seen by Dr. Oval Linsey on  05/2018.  She was admitted 12/2017 with atrial fibrillation RVR in the setting of sepsis and pneumonia.  At that time she was started on Eliquis and her metoprolol dose was increased.  Echocardiogram 12/2017, LVEF 65 to 70%, moderate LVH, no atrial dilation.  During a follow-up with Dr. Curt Bears 12/30/2017 she remained in atrial fibrillation.  She was started on flecainide.   After wearing a cardiac monitor it was apparent that she was having  episodes of overnight pauses and her metoprolol was discontinued, no plans were made for pacemaker implantation at that time.  She was seen by Calvin.D. 06/28/2018 for her hypertension and medication management.  Today she states she feels well.  She has been getting out more but is wearing a mask and social distancing.  She has completed her physical therapy and states that her knees are feeling better.  Her blood pressure was slightly elevated today but she states that her blood pressure normally runs in the 130s to 140 on her home log.  She denies chest pain, palpitations, shortness of breath, dizziness, syncope, melena, hemoptysis, hematuria, lower extremity edema, orthopnea and PND.  She does state that she is having episodes of nocturia.    The patient does not symptoms concerning for COVID-19 infection (fever, chills, cough, or new SHORTNESS OF BREATH).    Prior CV studies:   The following studies were reviewed today:  Echocardiogram 8/19:  - Left ventricle: The cavity size was normal. Wall thickness was   increased in a pattern of moderate LVH. Systolic function was   vigorous. The estimated ejection fraction was in the range  of 65%   to 70%. Wall motion was normal; there were no regional wall   motion abnormalities. The study was not technically sufficient to allow evaluation of LV diastolic dysfunction due to atrial   fibrillation. - Aortic valve: Valve area (VTI): 2.14 cm^2. Valve area (Vmax):   2.43 cm^2. Valve area (Vmean): 2.33 cm^2. - Mitral  valve: Valve area by continuity equation (using LVOT   flow): 1.61 cm^2. - Atrial septum: No defect or patent foramen ovale was identified.  Exercise tolerance test 9/19:  Blood pressure demonstrated a normal response to exercise.  There was no ST segment deviation noted during stress.  Target heart rate not achieved.  No ischemic changes noted at sub-maximal heart rate.  Study is not diagnostic due to failure to reach 85% of maximum predicted heart rate.   EKG 03/30/18 NSR (No QT prolongation)  Past Medical History:  Diagnosis Date  . Arthritis   . Bilateral cataracts    Dr Kathrin Penner  . Hyperlipidemia   . Hypertension    Past Surgical History:  Procedure Laterality Date  . COLONOSCOPY     Dr Fuller Plan  . 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 OVARIAN CYST REMOVAL       Current Meds  Medication Sig  . acetaminophen (TYLENOL) 500 MG tablet Take 500 mg by mouth every 8 (eight) hours as needed for mild pain.  Marland Kitchen atorvastatin (LIPITOR) 20 MG tablet Take 1 tablet (20 mg total) by mouth daily.  Marland Kitchen diltiazem (CARDIZEM CD) 180 MG 24 hr capsule TAKE (1) CAPSULE DAILY.  Marland Kitchen Docusate Sodium (STOOL SOFTENER LAXATIVE PO) Take 3-4 capsules by mouth daily.  Marland Kitchen ELIQUIS 5 MG TABS tablet TAKE 1 TABLET BY MOUTH TWICE DAILY.  . flecainide (TAMBOCOR) 100 MG tablet TAKE 1 TABLET BY MOUTH TWICE DAILY.  . hydrochlorothiazide (HYDRODIURIL) 25 MG tablet Take 1 tablet (25 mg total) by mouth daily.  . Multiple Vitamin (MULTIVITAMIN) tablet Take 1 tablet by mouth daily.  . valsartan (DIOVAN) 80 MG tablet TAKE 1 TABLET EACH DAY.     Allergies:   Ibuprofen   Social History   Tobacco Use  . Smoking status: Former Smoker    Quit date: 08/27/1984    Years since quitting: 34.1  . Smokeless tobacco: Never Used  . Tobacco comment: smoked age [age], up to 1.5-2 ppd  Substance Use Topics  . Alcohol use: Yes    Alcohol/week: 3.0 standard drinks    Types: 3 Cans of beer per week    Comment: 3 x  weekly  . Drug use: No     Family Hx: The patient's family history is negative for Colon cancer and Stomach cancer. She was adopted.  ROS:   Please see the history of present illness.     All other systems reviewed and are negative.   Labs/Other Tests and Data Reviewed:    Recent Labs: 12/16/2017: Magnesium 2.2 03/17/2018: TSH 2.30 09/15/2018: ALT 14; BUN 18; Creatinine, Ser 1.06; Hemoglobin 14.4; Platelets 293.0; Potassium 4.3; Sodium 141   Recent Lipid Panel Lab Results  Component Value Date/Time   CHOL 167 09/15/2018 11:37 AM   CHOL 174 12/05/2012 03:38 PM   TRIG 79.0 09/15/2018 11:37 AM   TRIG 109 12/05/2012 03:38 PM   HDL 70.10 09/15/2018 11:37 AM   HDL 60 12/05/2012 03:38 PM   CHOLHDL 2 09/15/2018 11:37 AM   LDLCALC 81 09/15/2018 11:37 AM   LDLCALC 92 12/05/2012 03:38 PM  Wt Readings from Last 3 Encounters:  10/20/18 170 lb (77.1 kg)  09/15/18 170 lb 6.4 oz (77.3 kg)  06/28/18 167 lb 6.4 oz (75.9 kg)     Exam:    Vital Signs:  BP (!) 145/75   Pulse 68   Ht 5\' 6"  (1.676 m)   Wt 170 lb (77.1 kg)   BMI 27.44 kg/m    She is in no acute distress, alert and oriented x3, unlabored breathing, and able to converse appropriately about her active health problems.   ASSESSMENT & PLAN:    1.  Essential hypertension- well controlled via patient home log Increase physical activity as tolerated Maintain low-sodium diet Continue diltiazem  180 mg Continue valsartan 80 mg Continue HCTZ 25 mg  2. PAF-No palpitations, SOB, or activity intolerance   Continue flecainide 100 mg BID EKG in the next month for QT evaluation  3. Hyperlipidemia-LDL 81 (09/2018) Continue Lipitor   COVID-19 Education: The signs and symptoms of COVID-19 were discussed with the patient and how to seek care for testing (follow up with PCP or arrange E-visit).  The importance of social distancing was discussed today.  Patient Risk:   After full review of this patients clinical status, I  feel that they are at least moderate risk at this time.  Time:   Today, I have spent 16 minutes with the patient with telehealth technology discussing hypertension, PAF, Hyperlipidemia.     Medication Adjustments/Labs and Tests Ordered: Current medicines are reviewed at length with the patient today.  Concerns regarding medicines are outlined above.   Tests Ordered: No orders of the defined types were placed in this encounter.  Medication Changes: No orders of the defined types were placed in this encounter.   Disposition: EKG in one month for QT evaluation. Follow up 6 months with Dr. Oval Linsey  Signed, Deberah Pelton, NP  10/20/2018 10:46 AM    [facility]

## 2018-10-20 NOTE — Telephone Encounter (Signed)
VERBAL CONSENT TO NICOLE CURTIS ON 10/17/2018 AT 12:05PM.     Virtual Visit Pre-Appointment Phone Call  "Sharon Knight, I am calling you today to discuss your upcoming appointment. We are currently trying to limit exposure to the virus that causes COVID-19 by seeing patients at home rather than in the office."  1. "What is the BEST phone number to call the day of the visit?" - include this in appointment notes  2. "Do you have or have access to (through a family member/friend) a smartphone with video capability that we can use for your visit?" a. If yes - list this number in appt notes as "cell" (if different from BEST phone #) and list the appointment type as a VIDEO visit in appointment notes b. If no - list the appointment type as a PHONE visit in appointment notes  3. Confirm consent - "In the setting of the current Covid19 crisis, you are scheduled for a (phone or video) visit with your provider on (date) at (time).  Just as we do with many in-office visits, in order for you to participate in this visit, we must obtain consent.  If you'd like, I can send this to your mychart (if signed up) or email for you to review.  Otherwise, I can obtain your verbal consent now.  All virtual visits are billed to your insurance company just like a normal visit would be.  By agreeing to a virtual visit, we'd like you to understand that the technology does not allow for your provider to perform an examination, and thus may limit your provider's ability to fully assess your condition. If your provider identifies any concerns that need to be evaluated in person, we will make arrangements to do so.  Finally, though the technology is pretty good, we cannot assure that it will always work on either your or our end, and in the setting of a video visit, we may have to convert it to a phone-only visit.  In either situation, we cannot ensure that we have a secure connection.  Are you willing to proceed?" STAFF: Did the  patient verbally acknowledge consent to telehealth visit? Document YES/NO here: YES  4. Advise patient to be prepared - "Two hours prior to your appointment, go ahead and check your blood pressure, pulse, oxygen saturation, and your weight (if you have the equipment to check those) and write them all down. When your visit starts, your provider will ask you for this information. If you have an Apple Watch or Kardia device, please plan to have heart rate information ready on the day of your appointment. Please have a pen and paper handy nearby the day of the visit as well."  5. Give patient instructions for MyChart download to smartphone OR Doximity/Doxy.me as below if video visit (depending on what platform provider is using)  6. Inform patient they will receive a phone call 15 minutes prior to their appointment time (may be from unknown caller ID) so they should be prepared to answer    TELEPHONE CALL NOTE  Sharon Knight has been deemed a candidate for a follow-up tele-health visit to limit community exposure during the Covid-19 pandemic. I spoke with the patient via phone to ensure availability of phone/video source, confirm preferred email & phone number, and discuss instructions and expectations.  I reminded Sharon Knight to be prepared with any vital sign and/or heart rhythm information that could potentially be obtained via home monitoring, at the time of her visit. I reminded  Sharon Knight to expect a phone call prior to her visit.  Harold Hedge, [place] 10/20/2018 9:43 AM   INSTRUCTIONS FOR DOWNLOADING THE MYCHART APP TO SMARTPHONE  - The patient must first make sure to have activated MyChart and know their login information - If Apple, go to [employer] and type in MyChart in the search bar and download the app. If Android, ask patient to go to [employer] and type in [place] in the search bar and download the app. The app is free but as with any other app downloads, their phone may  require them to verify saved payment information or Apple/Android password.  - The patient will need to then log into the app with their MyChart username and password, and select [facility] as their healthcare provider to link the account. When it is time for your visit, go to the MyChart app, find appointments, and click Begin Video Visit. Be sure to Select Allow for your device to access the Microphone and Camera for your visit. You will then be connected, and your provider will be with you shortly.  **If they have any issues connecting, or need assistance please contact MyChart service desk [phone])**  **If using a computer, in order to ensure the best quality for their visit they will need to use either of the following Internet Browsers: [employer], or Google Chrome**  IF USING DOXIMITY or DOXY.ME - The patient will receive a link just prior to their visit by text.     FULL LENGTH CONSENT FOR TELE-HEALTH VISIT   I hereby voluntarily request, consent and authorize [facility] and its employed or contracted physicians, physician assistants, nurse practitioners or other licensed health care professionals (the Practitioner), to provide me with telemedicine health care services (the "Services") as deemed necessary by the treating Practitioner. I acknowledge and consent to receive the Services by the Practitioner via telemedicine. I understand that the telemedicine visit will involve communicating with the Practitioner through live audiovisual communication technology and the disclosure of certain medical information by electronic transmission. I acknowledge that I have been given the opportunity to request an in-person assessment or other available alternative prior to the telemedicine visit and am voluntarily participating in the telemedicine visit.  I understand that I have the right to withhold or withdraw my consent to the use of telemedicine in the course of my care at  any time, without affecting my right to future care or treatment, and that the Practitioner or I may terminate the telemedicine visit at any time. I understand that I have the right to inspect all information obtained and/or recorded in the course of the telemedicine visit and may receive copies of available information for a reasonable fee.  I understand that some of the potential risks of receiving the Services via telemedicine include:  Marland Kitchen Delay or interruption in medical evaluation due to technological equipment failure or disruption; . Information transmitted may not be sufficient (e.g. poor resolution of images) to allow for appropriate medical decision making by the Practitioner; and/or  . In rare instances, security protocols could fail, causing a breach of personal health information.  Furthermore, I acknowledge that it is my responsibility to provide information about my medical history, conditions and care that is complete and accurate to the best of my ability. I acknowledge that Practitioner's advice, recommendations, and/or decision may be based on factors not within their control, such as incomplete or inaccurate data provided by me or distortions of diagnostic  images or specimens that may result from electronic transmissions. I understand that the practice of medicine is not an exact science and that Practitioner makes no warranties or guarantees regarding treatment outcomes. I acknowledge that I will receive a copy of this consent concurrently upon execution via email to the email address I last provided but may also request a printed copy by calling the office of [facility].    I understand that my insurance will be billed for this visit.   I have read or had this consent read to me. . I understand the contents of this consent, which adequately explains the benefits and risks of the Services being provided via telemedicine.  . I have been provided ample opportunity to ask questions  regarding this consent and the Services and have had my questions answered to my satisfaction. . I give my informed consent for the services to be provided through the use of telemedicine in my medical care  By participating in this telemedicine visit I agree to the above.

## 2018-10-20 NOTE — Telephone Encounter (Signed)
PER JESSE CLEAVER, NP,  Change valsartan to Olmesartan 5mg  Take 1 tablet once a day. Rx sent to pharmacy.

## 2018-10-20 NOTE — Patient Instructions (Signed)
Medication Instructions:  Your physician recommends that you continue on your current medications as directed. Please refer to the Current Medication list given to you today. If you need a refill on your cardiac medications before your next appointment, please call your pharmacy.   Lab work: None  If you have labs (blood work) drawn today and your tests are completely normal, you will receive your results only by: . MyChart Message (if you have MyChart) OR . A paper copy in the mail If you have any lab test that is abnormal or we need to change your treatment, we will call you to review the results.  Testing/Procedures: None   Follow-Up: At [facility], you and your health needs are our priority.  As part of our continuing mission to provide you with exceptional heart care, we have created designated Provider Care Teams.  These Care Teams include your primary Cardiologist (physician) and Advanced Practice Providers (APPs -  Physician Assistants and Nurse Practitioners) who all work together to provide you with the care you need, when you need it. You will need a follow up appointment in 6 months.  Please call our office 2 months in advance to schedule this appointment.  You may see Tiffany Norway, MD or one of the following Advanced Practice Providers on your designated Care Team:   Luke Kilroy, PA-C Krista Kroeger, PA-C . Callie Goodrich, PA-C  Any Other Special Instructions Will Be Listed Below (If Applicable).    

## 2018-11-15 DIAGNOSIS — H43392 Other vitreous opacities, left eye: Secondary | ICD-10-CM | POA: Diagnosis not present

## 2018-11-15 DIAGNOSIS — H31092 Other chorioretinal scars, left eye: Secondary | ICD-10-CM | POA: Diagnosis not present

## 2018-11-15 DIAGNOSIS — H43812 Vitreous degeneration, left eye: Secondary | ICD-10-CM | POA: Diagnosis not present

## 2018-11-15 DIAGNOSIS — H35341 Macular cyst, hole, or pseudohole, right eye: Secondary | ICD-10-CM | POA: Diagnosis not present

## 2018-11-16 ENCOUNTER — Other Ambulatory Visit: Payer: Self-pay

## 2018-11-16 ENCOUNTER — Ambulatory Visit (INDEPENDENT_AMBULATORY_CARE_PROVIDER_SITE_OTHER): Payer: Medicare Other

## 2018-11-16 VITALS — HR 61 | Ht 65.0 in | Wt 171.6 lb

## 2018-11-16 DIAGNOSIS — I10 Essential (primary) hypertension: Secondary | ICD-10-CM | POA: Diagnosis not present

## 2018-11-16 NOTE — Progress Notes (Signed)
Pt states that she is here for EKG she had a telephone visit with Denyse Amass cleaver and he wanted her to come in for EKG. Her EkG was viewed and signed as normal sinus by Dr Sallyanne Kuster. She has provided me with a list of her BP and HR. They are as follows: 05-28-2018   130/75  78 06-15-2018   143/67  67 07-15-2018   131/67  69 08-18-2018   138/77  69 09-05-2018     141/43  73 09-15-2018   130/72   PM 10-18-2018   133/69   68 10-20-2018   145/75   68 10-23-2018   149/71   68 pm 10-24-2018   145/75   69  Pm 10-26-2018   127/67   78  Pm 11-08-2018     142/70   79  Pm

## 2018-12-01 DIAGNOSIS — E119 Type 2 diabetes mellitus without complications: Secondary | ICD-10-CM | POA: Diagnosis not present

## 2018-12-01 DIAGNOSIS — H43813 Vitreous degeneration, bilateral: Secondary | ICD-10-CM | POA: Diagnosis not present

## 2018-12-01 DIAGNOSIS — Z961 Presence of intraocular lens: Secondary | ICD-10-CM | POA: Diagnosis not present

## 2018-12-01 DIAGNOSIS — H02054 Trichiasis without entropian left upper eyelid: Secondary | ICD-10-CM | POA: Diagnosis not present

## 2018-12-01 LAB — HM DIABETES EYE EXAM

## 2018-12-06 ENCOUNTER — Encounter: Payer: Self-pay | Admitting: Internal Medicine

## 2018-12-08 ENCOUNTER — Other Ambulatory Visit: Payer: Self-pay | Admitting: Internal Medicine

## 2018-12-13 ENCOUNTER — Other Ambulatory Visit: Payer: Self-pay | Admitting: Physician Assistant

## 2018-12-27 ENCOUNTER — Other Ambulatory Visit: Payer: Self-pay | Admitting: Obstetrics and Gynecology

## 2018-12-27 DIAGNOSIS — Z1231 Encounter for screening mammogram for malignant neoplasm of breast: Secondary | ICD-10-CM

## 2019-01-21 ENCOUNTER — Ambulatory Visit (INDEPENDENT_AMBULATORY_CARE_PROVIDER_SITE_OTHER): Payer: Medicare Other

## 2019-01-21 DIAGNOSIS — Z23 Encounter for immunization: Secondary | ICD-10-CM

## 2019-02-15 ENCOUNTER — Other Ambulatory Visit: Payer: Self-pay

## 2019-02-15 ENCOUNTER — Ambulatory Visit
Admission: RE | Admit: 2019-02-15 | Discharge: 2019-02-15 | Disposition: A | Discharge status: Home / Self Care | Payer: Medicare Other | Source: Ambulatory Visit | Attending: Obstetrics and Gynecology | Admitting: Obstetrics and Gynecology

## 2019-02-15 DIAGNOSIS — Z1231 Encounter for screening mammogram for malignant neoplasm of breast: Secondary | ICD-10-CM | POA: Diagnosis not present

## 2019-03-06 ENCOUNTER — Other Ambulatory Visit: Payer: Self-pay | Admitting: Internal Medicine

## 2019-03-16 DIAGNOSIS — N183 Chronic kidney disease, stage 3 unspecified: Secondary | ICD-10-CM | POA: Insufficient documentation

## 2019-03-16 NOTE — Progress Notes (Signed)
Subjective:    Patient ID: Sharon Knight, female    DOB: 1945/05/20, 73 y.o.   MRN: [id number]  HPI The patient is here for follow up.  She is doing PT exercise, but not exercising regularly.  She has started to try to walk, but has not done it regularly.    Chronic lower back pain:  She is doing her PT exercises.  First thing in the morning she has pain and stiffness.    Afib, Hypertension: She is taking her medication daily. She is compliant with a low sodium diet.  She denies chest pain, palpitations, edema, shortness of breath and regular headaches.     Diabetes: this is new as of 09/2018.  She is controlling her sugars with diet. She is sometimes compliant with a diabetic diet.  She denies numbness/tingling in her feet and foot lesions. She is up-to-date with an ophthalmology examination.   Hyperlipidemia: She is taking her medication daily. She is compliant with a low fat/cholesterol diet.    CKD:  She drinks sparkling water and tea.  She does not take any nsaids.   Medications and allergies reviewed with patient and updated if appropriate.  Patient Active Problem List   Diagnosis Date Noted  . CKD (chronic kidney disease) stage 3, GFR 30-59 ml/min 03/16/2019  . Diabetes mellitus without complication ([place]) 123XX123  . Bilateral low back pain 03/17/2018  . Gout 12/21/2017  . Lobar pneumonia ([place]) 12/10/2017  . Paroxysmal atrial fibrillation ([place]) 12/10/2017  . History of colonic polyps 10/22/2014  . 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 Vitamin D deficiency 10/31/2012  . ECZEMA 03/03/2010  . 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 Hyperlipidemia 08/02/2007  . Essential hypertension 08/02/2007  . Osteopenia 08/02/2007    Current Outpatient Medications on File Prior to Visit  Medication Sig Dispense Refill  . acetaminophen (TYLENOL) 500 MG tablet Take 500 mg by mouth every 8 (eight) hours as needed for mild pain.    Marland Kitchen atorvastatin (LIPITOR) 20 MG tablet  Take 0.5 tablets (10 mg total) by mouth daily. 90 tablet 3  . diltiazem (CARDIZEM CD) 180 MG 24 hr capsule TAKE (1) CAPSULE DAILY. 30 capsule 11  . Docusate Sodium (STOOL SOFTENER LAXATIVE PO) Take 3-4 capsules by mouth daily.    Marland Kitchen ELIQUIS 5 MG TABS tablet TAKE 1 TABLET BY MOUTH TWICE DAILY. 60 tablet 0  . flecainide (TAMBOCOR) 100 MG tablet TAKE 1 TABLET BY MOUTH TWICE DAILY. 60 tablet 10  . hydrochlorothiazide (HYDRODIURIL) 25 MG tablet Take 1 tablet (25 mg total) by mouth daily. 90 tablet 3  . Multiple Vitamin (MULTIVITAMIN) tablet Take 1 tablet by mouth daily.    Marland Kitchen olmesartan (BENICAR) 5 MG tablet Take 1 tablet (5 mg total) by mouth daily. 90 tablet 2   No current facility-administered medications on file prior to visit.     Past Medical History:  Diagnosis Date  . Arthritis   . Bilateral cataracts    Dr Kathrin Penner  . Hyperlipidemia   . Hypertension     Past Surgical History:  Procedure Laterality Date  . COLONOSCOPY     Dr Fuller Plan  . 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 OVARIAN CYST REMOVAL      Social History   Socioeconomic History  . Marital status: Married    Spouse name: Not on file  . Number of children: Not on file  . Years of education: Not on file  . Highest  education level: Not on file  Occupational History  . Not on file  Social Needs  . Financial resource strain: Not on file  . Food insecurity    Worry: Not on file    Inability: Not on file  . Transportation needs    Medical: Not on file    Non-medical: Not on file  Tobacco Use  . Smoking status: Former Smoker    Quit date: 08/27/1984    Years since quitting: 34.5  . Smokeless tobacco: Never Used  . Tobacco comment: smoked age [age], up to 1.5-2 ppd  Substance and Sexual Activity  . Alcohol use: Yes    Alcohol/week: 3.0 standard drinks    Types: 3 Cans of beer per week    Comment: 3 x weekly  . Drug use: No  . Sexual activity: Not on file  Lifestyle  . Physical activity    Days per  week: Patient refused    Minutes per session: Patient refused  . Stress: Not on file  Relationships  . Social Herbalist on phone: Not on file    Gets together: Not on file    Attends religious service: Not on file    Active member of club or organization: Not on file    Attends meetings of clubs or organizations: Not on file    Relationship status: Not on file  Other Topics Concern  . Not on file  Social History Narrative   Exercise: none    Family History  Adopted: Yes  Problem Relation Age of Onset  . Colon cancer Neg Hx   . Stomach cancer Neg Hx   . Breast cancer Neg Hx     Review of Systems  Constitutional: Negative for chills and fever.  Respiratory: Negative for cough, shortness of breath and wheezing.   Cardiovascular: Negative for chest pain, palpitations and leg swelling.  Neurological: Positive for light-headedness (occ). Negative for numbness and headaches.       Objective:   Vitals:   03/17/19 1050  BP: 134/78  Pulse: (!) 59  Resp: 16  Temp: 99.2 F (37.3 C)  SpO2: 97%   BP Readings from Last 3 Encounters:  03/17/19 134/78  10/20/18 (!) 145/75  09/15/18 130/72   Wt Readings from Last 3 Encounters:  03/17/19 174 lb 12.8 oz (79.3 kg)  11/16/18 171 lb 9.6 oz (77.8 kg)  10/20/18 170 lb (77.1 kg)   Body mass index is 29.09 kg/m.   Physical Exam    Constitutional: Appears well-developed and well-nourished. No distress.  HENT:  Head: Normocephalic and atraumatic.  Neck: Neck supple. No tracheal deviation present. No thyromegaly present.  No cervical lymphadenopathy Cardiovascular: Normal rate, regular rhythm and normal heart sounds.   No murmur heard. No carotid bruit .  No edema Pulmonary/Chest: Effort normal and breath sounds normal. No respiratory distress. No has no wheezes. No rales.  Skin: Skin is warm and dry. Not diaphoretic.  Psychiatric: Normal mood and affect. Behavior is normal.    Diabetic Foot Exam - Simple   Simple  Foot Form Diabetic Foot exam was performed with the following findings: Yes 03/17/2019 11:17 AM  Visual Inspection No deformities, no ulcerations, no other skin breakdown bilaterally: Yes See comments: Yes Sensation Testing Intact to touch and monofilament testing bilaterally: Yes Pulse Check Posterior Tibialis and Dorsalis pulse intact bilaterally: Yes Comments B/l bunions      Assessment & Plan:    See Problem List for Assessment and Plan of  chronic medical problems.

## 2019-03-16 NOTE — Patient Instructions (Addendum)
  Tests ordered today. Your results will be released to MyChart (or called to you) after review.  If any changes need to be made, you will be notified at that same time.    Medications reviewed and updated.  Changes include :   none     Please followup in 6 months   

## 2019-03-17 ENCOUNTER — Other Ambulatory Visit (INDEPENDENT_AMBULATORY_CARE_PROVIDER_SITE_OTHER): Payer: Medicare Other

## 2019-03-17 ENCOUNTER — Other Ambulatory Visit: Payer: Self-pay

## 2019-03-17 ENCOUNTER — Ambulatory Visit (INDEPENDENT_AMBULATORY_CARE_PROVIDER_SITE_OTHER): Payer: Medicare Other | Admitting: Internal Medicine

## 2019-03-17 ENCOUNTER — Encounter: Payer: Self-pay | Admitting: Internal Medicine

## 2019-03-17 VITALS — BP 134/78 | HR 59 | Temp 99.2°F | Resp 16 | Ht 65.0 in | Wt 174.8 lb

## 2019-03-17 DIAGNOSIS — I480 Paroxysmal atrial fibrillation: Secondary | ICD-10-CM

## 2019-03-17 DIAGNOSIS — N1831 Chronic kidney disease, stage 3a: Secondary | ICD-10-CM

## 2019-03-17 DIAGNOSIS — E119 Type 2 diabetes mellitus without complications: Secondary | ICD-10-CM

## 2019-03-17 DIAGNOSIS — E7849 Other hyperlipidemia: Secondary | ICD-10-CM

## 2019-03-17 DIAGNOSIS — I10 Essential (primary) hypertension: Secondary | ICD-10-CM | POA: Diagnosis not present

## 2019-03-17 DIAGNOSIS — M65341 Trigger finger, right ring finger: Secondary | ICD-10-CM | POA: Insufficient documentation

## 2019-03-17 LAB — LIPID PANEL
Cholesterol: 190 mg/dL (ref 0–200)
HDL: 63 mg/dL (ref >39.00)
LDL Cholesterol: 100 mg/dL — ABNORMAL HIGH (ref 0–99)
NonHDL: 126.94
Total CHOL/HDL Ratio: 3
Triglycerides: 137 mg/dL (ref 0.0–149.0)
VLDL: 27.4 mg/dL (ref 0.0–40.0)

## 2019-03-17 LAB — CBC WITH DIFFERENTIAL/PLATELET
Basophils Absolute: 0.1 10*3/uL (ref 0.0–0.1)
Basophils Relative: 1.2 % (ref 0.0–3.0)
Eosinophils Absolute: 0.2 10*3/uL (ref 0.0–0.7)
Eosinophils Relative: 2.2 % (ref 0.0–5.0)
HCT: 41.9 % (ref 36.0–46.0)
Hemoglobin: 14.4 g/dL (ref 12.0–15.0)
Lymphocytes Relative: 21.4 % (ref 12.0–46.0)
Lymphs Abs: 1.8 10*3/uL (ref 0.7–4.0)
MCHC: 34.4 g/dL (ref 30.0–36.0)
MCV: 91.6 fl (ref 78.0–100.0)
Monocytes Absolute: 0.9 10*3/uL (ref 0.1–1.0)
Monocytes Relative: 10.3 % (ref 3.0–12.0)
Neutro Abs: 5.5 10*3/uL (ref 1.4–7.7)
Neutrophils Relative %: 64.9 % (ref 43.0–77.0)
Platelets: 288 10*3/uL (ref 150.0–400.0)
RBC: 4.58 Mil/uL (ref 3.87–5.11)
RDW: 12.8 % (ref 11.5–15.5)
WBC: 8.5 10*3/uL (ref 4.0–10.5)

## 2019-03-17 LAB — HEMOGLOBIN A1C: Hgb A1c MFr Bld: 6.4 % (ref 4.6–6.5)

## 2019-03-17 LAB — COMPREHENSIVE METABOLIC PANEL
ALT: 19 U/L (ref 0–35)
AST: 21 U/L (ref 0–37)
Albumin: 4.6 g/dL (ref 3.5–5.2)
Alkaline Phosphatase: 88 U/L (ref 39–117)
BUN: 17 mg/dL (ref 6–23)
CO2: 31 mEq/L (ref 19–32)
Calcium: 10.1 mg/dL (ref 8.4–10.5)
Chloride: 102 mEq/L (ref 96–112)
Creatinine, Ser: 1.06 mg/dL (ref 0.40–1.20)
GFR: 50.58 mL/min — ABNORMAL LOW (ref >60.00)
Glucose, Bld: 89 mg/dL (ref 70–99)
Potassium: 4.2 mEq/L (ref 3.5–5.1)
Sodium: 142 mEq/L (ref 135–145)
Total Bilirubin: 0.6 mg/dL (ref 0.2–1.2)
Total Protein: 7.7 g/dL (ref 6.0–8.3)

## 2019-03-17 LAB — TSH: TSH: 3.63 u[IU]/mL (ref 0.35–4.50)

## 2019-03-17 NOTE — Assessment & Plan Note (Signed)
Mild symptoms No treatment needed now

## 2019-03-17 NOTE — Assessment & Plan Note (Signed)
BP well controlled Current regimen effective and well tolerated Continue current medications at current doses cmp  

## 2019-03-17 NOTE — Assessment & Plan Note (Addendum)
No palps, sob, cp Following with cardiology On eliquis Cbc, cmp, tsh

## 2019-03-17 NOTE — Assessment & Plan Note (Signed)
cmp

## 2019-03-17 NOTE — Assessment & Plan Note (Signed)
Check lipid panel, cmp Continue daily statin Regular exercise and healthy diet encouraged  

## 2019-03-17 NOTE — Assessment & Plan Note (Signed)
Diet controlled Check a1c Low sugar / carb diet Stressed regular exercise  

## 2019-03-19 ENCOUNTER — Other Ambulatory Visit: Payer: Self-pay | Admitting: Cardiology

## 2019-03-20 ENCOUNTER — Telehealth: Payer: Self-pay | Admitting: Emergency Medicine

## 2019-03-20 NOTE — Telephone Encounter (Signed)
Will send in the mail.

## 2019-03-20 NOTE — Telephone Encounter (Signed)
Pt is requesting a print out of her labs to be mailed to her when they are available thanks.

## 2019-04-02 ENCOUNTER — Other Ambulatory Visit: Payer: Self-pay | Admitting: Internal Medicine

## 2019-04-18 ENCOUNTER — Encounter: Payer: Self-pay | Admitting: Cardiovascular Disease

## 2019-04-18 ENCOUNTER — Ambulatory Visit (INDEPENDENT_AMBULATORY_CARE_PROVIDER_SITE_OTHER): Payer: Medicare Other | Admitting: Cardiovascular Disease

## 2019-04-18 ENCOUNTER — Other Ambulatory Visit: Payer: Self-pay

## 2019-04-18 VITALS — BP 132/75 | HR 66 | Temp 97.0°F | Ht 66.0 in | Wt 174.6 lb

## 2019-04-18 DIAGNOSIS — I480 Paroxysmal atrial fibrillation: Secondary | ICD-10-CM | POA: Diagnosis not present

## 2019-04-18 DIAGNOSIS — E7800 Pure hypercholesterolemia, unspecified: Secondary | ICD-10-CM

## 2019-04-18 DIAGNOSIS — I10 Essential (primary) hypertension: Secondary | ICD-10-CM | POA: Diagnosis not present

## 2019-04-18 MED ORDER — FLECAINIDE ACETATE 100 MG PO TABS: 100.0000 mg | ORAL_TABLET | Freq: Two times a day (BID) | ORAL | 3 refills | Status: DC

## 2019-04-18 NOTE — Patient Instructions (Signed)
Medication Instructions:  Your physician recommends that you continue on your current medications as directed. Please refer to the Current Medication list given to you today.  *If you need a refill on your cardiac medications before your next appointment, please call your pharmacy*  Lab Work: NONE   Testing/Procedures: NONE   Follow-Up: At [facility], you and your health needs are our priority.  As part of our continuing mission to provide you with exceptional heart care, we have created designated Provider Care Teams.  These Care Teams include your primary Cardiologist (physician) and Advanced Practice Providers (APPs -  Physician Assistants and Nurse Practitioners) who all work together to provide you with the care you need, when you need it.  Your next appointment:   6 month(s) You will receive a reminder letter in the mail two months in advance. If you don't receive a letter, please call our office to schedule the follow-up appointment.  The format for your next appointment:   Either In Person or Virtual  Provider:   You may see Skeet Latch, MD or one of the following Advanced Practice Providers on your designated Care Team:    Kerin Ransom, PA-C  [place], [place]  Coletta Memos, [place]

## 2019-04-18 NOTE — Progress Notes (Signed)
Cardiology Office Note   Date:  04/18/2019   ID:  Sharon Knight, DOB 1945/10/01, MRN [id number]  PCP:  Sharon Rail, MD  Cardiologist:   Sharon Latch, MD   No chief complaint on file.    History of Present Illness: Sharon Knight is a 73 y.o. female with paroxysmal atrial fibrillation, hypertension, hyperlipidemia and right nephrectomywho presents for follow up.  She was admitted 12/2017 with atrial fibrillation with RVR in the setting of sepsis and pneumonia.  She was started on Eliquis and her home dose of metoprolol was increased.  Echocardiogram 12/11/2017 revealed LVEF 65 to 70% with moderate LVH.  Her left atrium was not dilated.  She followed up with Sharon Deforest, PA on 12/22/17 and was back in sinus rhythm.  She had a 30-day event monitor that did not reveal recurrent atrial fibrillation.  She followed up with Dr. Curt Knight 12/30/2017 and was still in atrial fibrillation at that time.  She was started on flecainide and metoprolol was discontinued.  She continued to wear her monitor and had multiple episodes of pauses overnight.  She was seen by Dr. Curt Knight in the ED 01/06/2018 and her evening dose of metoprolol was discontinued.  Ms. Sharon Knight was dizzy and bradycardic so metoprolol was discontinued.  Her BP was poorly controlled and HCTZ was increased to 25mg .  At her last appointment Ms. Bognar's blood pressure was elevated so valsartan was added to her regimen.  She followed up with our pharmacist 06/2018 and was doing well.  This was switched to olmesartan due to drug shortage.  She thinks it is a little more effective with her BP.  She notes that she doesn't have much stamina and she feels a little unsteady on her feet.  When she walks outside she uses a cane.  She is frustrated that she has been gaining weight lately.  She knows she should get more exercise.  She is also limited by pain in her lower back and arthritis.  She has not experienced any episodes of atrial fibrillation.  She denies  any chest pain or shortness of breath.  She has not noted any lower extremity edema, orthopnea, or PND.  Past Medical History:  Diagnosis Date  . Arthritis   . Bilateral cataracts    Dr Sharon Knight  . Hyperlipidemia   . Hypertension     Past Surgical History:  Procedure Laterality Date  . COLONOSCOPY     Dr Sharon Knight  . 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 OVARIAN CYST REMOVAL       Current Outpatient Medications  Medication Sig Dispense Refill  . acetaminophen (TYLENOL) 500 MG tablet Take 500 mg by mouth every 8 (eight) hours as needed for mild pain.    Sharon Knight atorvastatin (LIPITOR) 20 MG tablet Take 0.5 tablets (10 mg total) by mouth daily. 90 tablet 3  . diltiazem (CARDIZEM CD) 180 MG 24 hr capsule TAKE (1) CAPSULE DAILY. 30 capsule 11  . Docusate Sodium (STOOL SOFTENER LAXATIVE PO) Take 3-4 capsules by mouth daily.    Sharon Knight ELIQUIS 5 MG TABS tablet TAKE 1 TABLET BY MOUTH TWICE DAILY. 60 tablet 3  . flecainide (TAMBOCOR) 100 MG tablet Take 1 tablet (100 mg total) by mouth 2 (two) times daily. Please make overdue appt with Dr. Curt Knight before anymore refills. 1st attempt 60 tablet 0  . hydrochlorothiazide (HYDRODIURIL) 25 MG tablet Take 1 tablet (25 mg total) by mouth daily. 90 tablet 3  .  olmesartan (BENICAR) 5 MG tablet Take 1 tablet (5 mg total) by mouth daily. 90 tablet 2  . Multiple Vitamin (MULTIVITAMIN) tablet Take 1 tablet by mouth daily.     No current facility-administered medications for this visit.    Allergies:   Ibuprofen    Social History:  The patient  reports that she quit smoking about 34 years ago. She has never used smokeless tobacco. She reports current alcohol use of about 3.0 standard drinks of alcohol per week. She reports that she does not use drugs.   Family History:  The patient's family history is not on file. She was adopted.    ROS:  Please see the history of present illness.   Otherwise, review of systems are positive for none.   All other  systems are reviewed and negative.    PHYSICAL EXAM: VS:  BP 132/75   Pulse 66   Temp (!) 97 F (36.1 C)   Ht 5\' 6"  (1.676 m)   Wt 174 lb 9.6 oz (79.2 kg)   SpO2 97%   BMI 28.18 kg/m  , BMI Body mass index is 28.18 kg/m. GENERAL:  Well appearing HEENT: Pupils equal round and reactive, fundi not visualized, oral mucosa unremarkable NECK:  No jugular venous distention, waveform within normal limits, carotid upstroke brisk and symmetric, no bruits LUNGS:  Clear to auscultation bilaterally HEART:  RRR.  PMI not displaced or sustained,S1 and S2 within normal limits, no S3, no S4, no clicks, no rubs, II/VI systolic murmur at the LUSB ABD:  Flat, positive bowel sounds normal in frequency in pitch, no bruits, no rebound, no guarding, no midline pulsatile mass, no hepatomegaly, no splenomegaly EXT:  2 plus pulses throughout, no edema, no cyanosis no clubbing SKIN:  No rashes no nodules NEURO:  Cranial nerves II through XII grossly intact, motor grossly intact throughout PSYCH:  Cognitively intact, oriented to person place and time   EKG:  EKG is not ordered today.   ETT 01/25/18: 4.6 METS on Bruce protocol.  No ischemia.  Echocardiogram 12/11/2017 Study Conclusions - Left ventricle: The cavity size was normal. Wall thickness was increased in a pattern of moderate LVH. Systolic function was vigorous. The estimated ejection fraction was in the range of 65% to 70%. Wall motion was normal; there were no regional wall motion abnormalities. The study was not technically sufficient to allow evaluation of LV diastolic dysfunction due to atrial fibrillation. - Aortic valve: Valve area (VTI): 2.14 cm^2. Valve area (Vmax): 2.43 cm^2. Valve area (Vmean): 2.33 cm^2. - Mitral valve: Valve area by continuity equation (using LVOT flow): 1.61 cm^2. - Atrial septum: No defect or patent foramen ovale was identified  30 Day Event Monitor 12/23/17:   Quality: Fair.  Baseline  artifact. Predominant rhythm: sinus rhythm Average heart rate: 68 bpm Max heart rate: 174 bpm Min heart rate: 38 bpm 199 pauses >3 seconds Pauses noted up to 6 seconds. All occurred over night or early morning. PVCs Atrial flutter 16% (71% controlled, <1% slow, 29% rapid)   Recent Labs: 03/17/2019: ALT 19; BUN 17; Creatinine, Ser 1.06; Hemoglobin 14.4; Platelets 288.0; Potassium 4.2; Sodium 142; TSH 3.63    Lipid Panel    Component Value Date/Time   CHOL 190 03/17/2019 1132   CHOL 174 12/05/2012 1538   TRIG 137.0 03/17/2019 1132   TRIG 109 12/05/2012 1538   HDL 63.00 03/17/2019 1132   HDL 60 12/05/2012 1538   CHOLHDL 3 03/17/2019 1132   VLDL 27.4 03/17/2019 1132  LDLCALC 100 (H) 03/17/2019 1132   LDLCALC 92 12/05/2012 1538      Wt Readings from Last 3 Encounters:  04/18/19 174 lb 9.6 oz (79.2 kg)  03/17/19 174 lb 12.8 oz (79.3 kg)  11/16/18 171 lb 9.6 oz (77.8 kg)      ASSESSMENT AND Knight:  # Paroxysmal atrial flutter: # Sick sinus syndrome:  She continues to maintain sinus rhythm.  Continue flecainide, diltiazem, and Eliquis.  # Hypertension:  BP well-controlled.  Continue diltiazem, HCTZ, and olmesartan.   # Hyperlipidemia: LDL 100 03/2019.  Continue atorvastatin.    Current medicines are reviewed at length with the patient today.  The patient does not have concerns regarding medicines.  The following changes have been made: Add valsartan.   Labs/ tests ordered today include:   No orders of the defined types were placed in this encounter.    Disposition:   FU with Lamberto Dinapoli C. Oval Linsey, MD, [facility] in 6 months.    Signed, Litha Lamartina C. Oval Linsey, MD, [facility]  04/18/2019 10:27 AM    [place]

## 2019-05-07 ENCOUNTER — Other Ambulatory Visit: Payer: Self-pay | Admitting: Cardiovascular Disease

## 2019-06-03 IMAGING — DX DG FOOT 2V*R*
2 series · 2 of 2 positions shown · non-contrast
Comparison: None.

CLINICAL DATA: Right foot pain.

EXAM:
RIGHT FOOT - 2 VIEW

[foot ap]
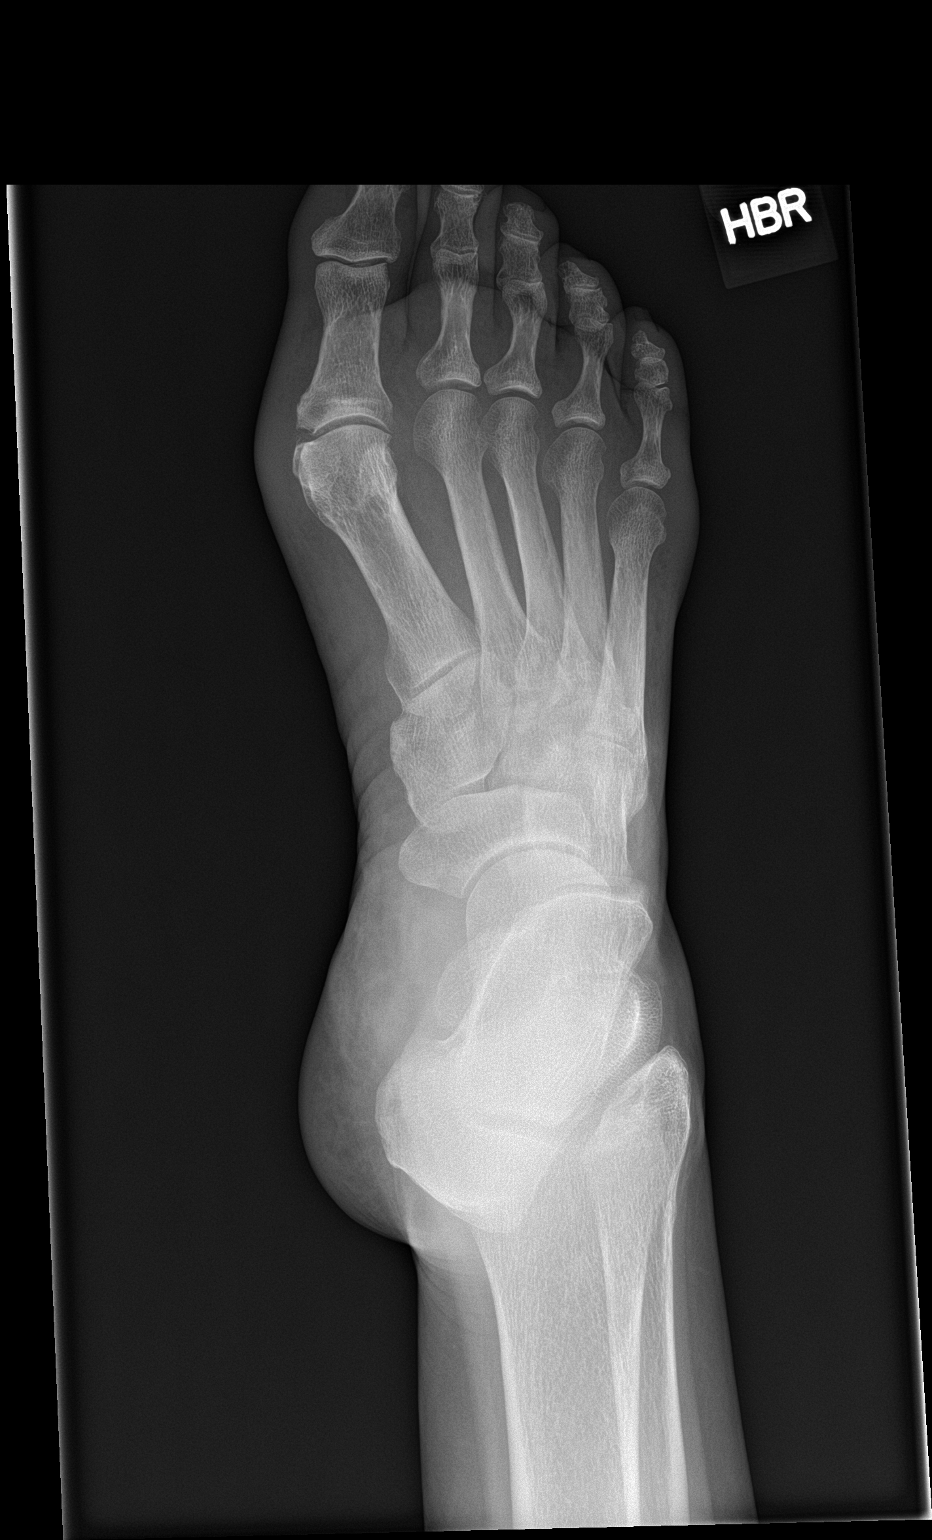

[foot lat]
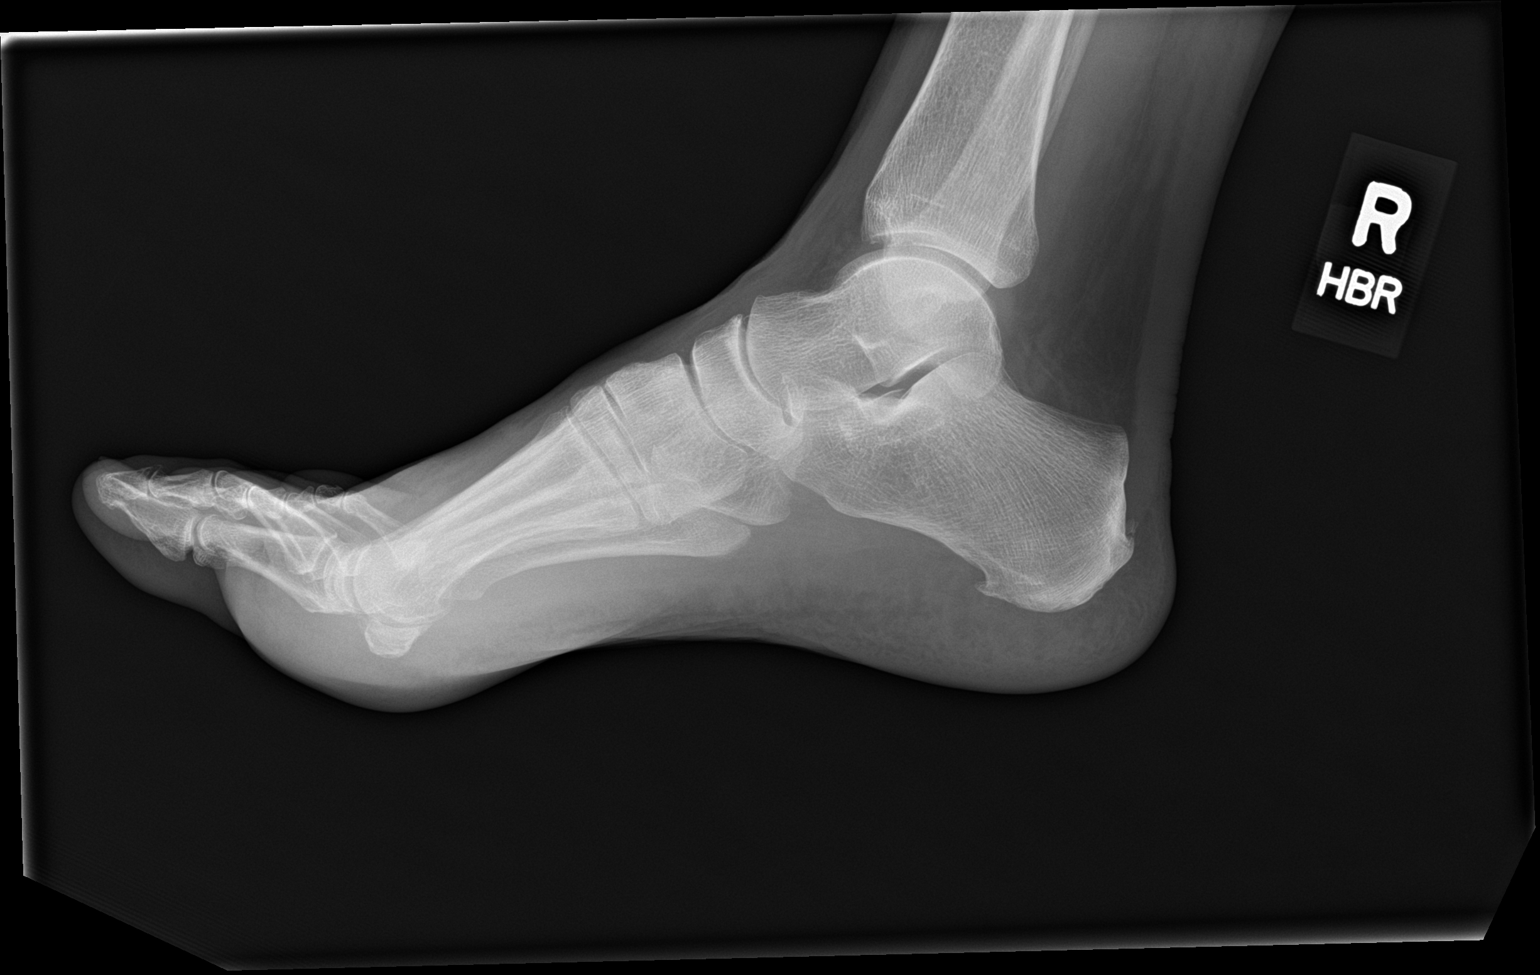

[2 of 2 positions shown; findings below may reference images not displayed]

FINDINGS: There is no evidence of fracture or dislocation. There is no
evidence of arthropathy or other focal bone abnormality. Soft
tissues are unremarkable.
IMPRESSION: Normal right foot.

## 2019-06-28 DIAGNOSIS — Z0120 Encounter for dental examination and cleaning without abnormal findings: Secondary | ICD-10-CM | POA: Diagnosis not present

## 2019-06-30 ENCOUNTER — Telehealth: Payer: Self-pay | Admitting: Internal Medicine

## 2019-06-30 DIAGNOSIS — E7849 Other hyperlipidemia: Secondary | ICD-10-CM

## 2019-06-30 NOTE — Chronic Care Management (AMB) (Signed)
  Chronic Care Management   Note  06/30/2019 Name: Sharon Knight MRN: [id number] DOB: 1946-02-07  Sharon Knight is a 74 y.o. year old female who is a primary care patient of Burns, Claudina Lick, MD. I reached out to Hermine Messick by phone today in response to a referral sent by Ms. Estrella Deeds PCP, Binnie Rail, MD. Husband, Chrissie Noa gave verbal consent for CCM.  Ms. Siegmann was given information about Chronic Care Management services today including:  1. CCM service includes personalized support from designated clinical staff supervised by her physician, including individualized plan of care and coordination with other care providers 2. 24/7 contact phone numbers for assistance for urgent and routine care needs. 3. Service will only be billed when office clinical staff spend 20 minutes or more in a month to coordinate care. 4. Only one practitioner may furnish and bill the service in a calendar month. 5. The patient may stop CCM services at any time (effective at the end of the month) by phone call to the office staff. 6. The patient will be responsible for cost sharing (co-pay) of up to 20% of the service fee (after annual deductible is met).  Patient agreed to services and verbal consent obtained.   Follow up plan:   Raynicia Dukes [facility] Scheduler

## 2019-07-10 NOTE — Addendum Note (Signed)
Addended by: Karle Barr on: 07/10/2019 02:14 PM   Modules accepted: Orders

## 2019-07-12 ENCOUNTER — Ambulatory Visit: Payer: Medicare Other | Admitting: Pharmacist

## 2019-07-12 ENCOUNTER — Other Ambulatory Visit: Payer: Self-pay

## 2019-07-12 DIAGNOSIS — E7849 Other hyperlipidemia: Secondary | ICD-10-CM

## 2019-07-12 DIAGNOSIS — M545 Low back pain, unspecified: Secondary | ICD-10-CM

## 2019-07-12 DIAGNOSIS — E119 Type 2 diabetes mellitus without complications: Secondary | ICD-10-CM

## 2019-07-12 DIAGNOSIS — I10 Essential (primary) hypertension: Secondary | ICD-10-CM

## 2019-07-12 DIAGNOSIS — I480 Paroxysmal atrial fibrillation: Secondary | ICD-10-CM

## 2019-07-12 NOTE — Chronic Care Management (AMB) (Signed)
Chronic Care Management Pharmacy  Name: Sharon Knight  MRN: [id number] DOB: 06-Mar-1946  Chief Complaint/ HPI  Sharon Knight,  74 y.o. , female presents for their Initial CCM visit with the clinical pharmacist via telephone due to COVID-19 Pandemic.  PCP : Binnie Rail, MD  Their chronic conditions include: HTN, Afib, DM, CKD3 w/ hx nephrectomy, HLD, gout, back pain  Office Visits: 03/17/19 Dr [name] OV: pt stable, no med changes.  Consult Visit: 04/18/19 Dr Oval Linsey (cardiology): maintaining NSR, no med changes.  Medications: Outpatient Encounter Medications as of 07/12/2019  Medication Sig  . acetaminophen (TYLENOL) 500 MG tablet Take 500 mg by mouth every 8 (eight) hours as needed for mild pain.  Marland Kitchen atorvastatin (LIPITOR) 20 MG tablet Take 0.5 tablets (10 mg total) by mouth daily.  Marland Kitchen diltiazem (CARDIZEM CD) 180 MG 24 hr capsule TAKE (1) CAPSULE DAILY.  Marland Kitchen Docusate Sodium (STOOL SOFTENER LAXATIVE PO) Take 3-4 capsules by mouth daily.  Marland Kitchen ELIQUIS 5 MG TABS tablet TAKE 1 TABLET BY MOUTH TWICE DAILY.  . flecainide (TAMBOCOR) 100 MG tablet Take 1 tablet (100 mg total) by mouth 2 (two) times daily. Please make overdue appt with Dr. Curt Bears before anymore refills. 1st attempt  . hydrochlorothiazide (HYDRODIURIL) 25 MG tablet TAKE 1 TABLET EACH DAY.  . Multiple Vitamin (MULTIVITAMIN) tablet Take 1 tablet by mouth daily.  Marland Kitchen olmesartan (BENICAR) 5 MG tablet Take 1 tablet (5 mg total) by mouth daily.   No facility-administered encounter medications on file as of 07/12/2019.     Current Diagnosis/Assessment:  Goals Addressed            This Visit's Progress   . Pharmacy Care Plan       CARE PLAN ENTRY Current Barriers:  . Chronic Disease Management support, education, and care coordination needs related to Atrial Fibrillation, HTN, HLD, and DMII  Pharmacist Clinical Goal(s):  Marland Kitchen Maintain BP < 140/90 . Maintain A1c < 7% . Ensure safety, efficacy, and affordability of  medications  Interventions: . Comprehensive medication review performed. . Discussed diet and exercise goals for staying healthy . Consider patient assistance for Eliquis when coverage gap ("donut hole") is reached  Patient Self Care Activities:  . Self administers medications as prescribed, Calls pharmacy for medication refills, and Calls provider office for new concerns or questions  Initial goal documentation       AFIB/Hypertension   Patient is currently rhythm controlled.  Office blood pressures are  BP Readings from Last 3 Encounters:  04/18/19 132/75  03/17/19 134/78  10/20/18 (!) 145/75   Lab Results  Component Value Date   CREATININE 1.06 03/17/2019   CREATININE 1.06 09/15/2018   CREATININE 1.03 (H) 06/02/2018    Patient checks BP at home 3-5x per week  Patient home BP readings are ranging: 120s-130s/60s-70s  Patient has failed these meds in past: metoprolol Patient is currently controlled on the following medications: diltiazem 180 mg daily, flecainide 100 mg BID, HCTZ 25 mg daily, olmesartan 5 mg daily, Eliquis 5 mg BID  We discussed: Pt reports Afib started during hospitalization for Pneumonia in 2019. She does not feel when she goes into Afib. She feels current meds are working well for her.   Plan  Continue current medications and control with diet and exercise   Diabetes   Recent Relevant Labs: Lab Results  Component Value Date/Time   HGBA1C 6.4 03/17/2019 11:32 AM   HGBA1C 6.5 09/15/2018 11:37 AM   MICROALBUR 0.2 11/01/2007 08:29 AM  No medication indicated.  We discussed: diet and exercise extensively and A1c goals.  Pt reports she is trying to eat better, discussed proportion control and maximizing vegetables/protein over carbs. Her exercise consists of walking around the block and running errands.  Plan  Continue control with diet and exercise   Hyperlipidemia   Lipid Panel     Component Value Date/Time   CHOL 190 03/17/2019  1132   TRIG 137.0 03/17/2019 1132   HDL 63.00 03/17/2019 1132   CHOLHDL 3 03/17/2019 1132   VLDL 27.4 03/17/2019 1132   LDLCALC 100 (H) 03/17/2019 1132    ASCVD 10-year risk: 38.5%  Patient has failed these meds in past: n/a Patient is currently controlled on the following medications: atorvastatin 10 mg (1/2 of 20 mg) daily  We discussed:  diet and exercise extensively  Plan  Continue current medications    Back pain   Patient has failed these meds in past: n/a Patient is currently controlled on the following medications: acetaminophen 500 mg q8h PRN  We discussed:  Tylenol daily limits. Pt reports pain is generally controlled with 1-2 Tylenol.  Plan  Continue current medications   Health Maintenance   Patient is currently controlled on the following medications: docusate 100-300 mg HS, prn glycerin suppository, multivitamin  We discussed:  Bowel movements usually well controlled with docusate, occasionally uses suppository.   Plan  Continue current medications    Medication Management   Pt uses [facility] for all medications Does not use pill box Pt endorses 100% compliance  We discussed: med regimen is working well for her. [facility] is not a preferred pharmacy with insurance so did not discuss.  Plan  Continue current med management strategy    Follow up: 6 month phone visit  Charlene Brooke, PharmD Clinical Pharmacist [facility] at [facility] [phone]

## 2019-07-12 NOTE — Progress Notes (Signed)
  I have reviewed this encounter including the documentation in this note and/or discussed this patient with the care management provider. I am certifying that I agree with the content of this note as supervising physician.  Binnie Rail, MD   07/12/2019

## 2019-07-12 NOTE — Patient Instructions (Addendum)
Visit Information  Thank you for meeting with me to discuss your medications! I look forward to working with you to achieve your health care goals. Below is a summary of what we talked about during the visit:  Goals Addressed            This Visit's Progress   . Pharmacy Care Plan       CARE PLAN ENTRY Current Barriers:  . Chronic Disease Management support, education, and care coordination needs related to Atrial Fibrillation, HTN, HLD, and DMII  Pharmacist Clinical Goal(s):  Marland Kitchen Maintain BP < 140/90 . Maintain A1c < 7% . Ensure safety, efficacy, and affordability of medications  Interventions: . Comprehensive medication review performed. . Discussed diet and exercise goals for staying healthy . Consider patient assistance for Eliquis when coverage gap ("donut hole") is reached  Patient Self Care Activities:  . Self administers medications as prescribed, Calls pharmacy for medication refills, and Calls provider office for new concerns or questions  Initial goal documentation       Ms. Schepps was given information about Chronic Care Management services today including:  1. CCM service includes personalized support from designated clinical staff supervised by her physician, including individualized plan of care and coordination with other care providers 2. 24/7 contact phone numbers for assistance for urgent and routine care needs. 3. Service will only be billed when office clinical staff spend 20 minutes or more in a month to coordinate care. 4. Only one practitioner may furnish and bill the service in a calendar month. 5. The patient may stop CCM services at any time (effective at the end of the month) by phone call to the office staff.  Patient agreed to services and verbal consent obtained.   The patient verbalized understanding of instructions provided today and agreed to receive a mailed copy of patient instruction and/or educational materials. Telephone follow up  appointment with pharmacy team member scheduled for: 01/16/20 @ 1:30 pm   Charlene Brooke, PharmD Clinical Pharmacist [facility] [phone]  Diabetes Mellitus and Nutrition, Adult When you have diabetes (diabetes mellitus), it is very important to have healthy eating habits because your blood sugar (glucose) levels are greatly affected by what you eat and drink. Eating healthy foods in the appropriate amounts, at about the same times every day, can help you:  Control your blood glucose.  Lower your risk of heart disease.  Improve your blood pressure.  Reach or maintain a healthy weight. Every person with diabetes is different, and each person has different needs for a meal plan. Your health care provider may recommend that you work with a diet and nutrition specialist (dietitian) to make a meal plan that is best for you. Your meal plan may vary depending on factors such as:  The calories you need.  The medicines you take.  Your weight.  Your blood glucose, blood pressure, and cholesterol levels.  Your activity level.  Other health conditions you have, such as heart or kidney disease. How do carbohydrates affect me? Carbohydrates, also called carbs, affect your blood glucose level more than any other type of food. Eating carbs naturally raises the amount of glucose in your blood. Carb counting is a method for keeping track of how many carbs you eat. Counting carbs is important to keep your blood glucose at a healthy level, especially if you use insulin or take certain oral diabetes medicines. It is important to know how many carbs you can safely have in  each meal. This is different for every person. Your dietitian can help you calculate how many carbs you should have at each meal and for each snack. Foods that contain carbs include:  Bread, cereal, rice, pasta, and crackers.  Potatoes and corn.  Peas, beans, and lentils.  Milk and yogurt.  Fruit  and juice.  Desserts, such as cakes, cookies, ice cream, and candy. How does alcohol affect me? Alcohol can cause a sudden decrease in blood glucose (hypoglycemia), especially if you use insulin or take certain oral diabetes medicines. Hypoglycemia can be a life-threatening condition. Symptoms of hypoglycemia (sleepiness, dizziness, and confusion) are similar to symptoms of having too much alcohol. If your health care provider says that alcohol is safe for you, follow these guidelines:  Limit alcohol intake to no more than 1 drink per day for nonpregnant women and 2 drinks per day for men. One drink equals 12 oz of beer, 5 oz of wine, or 1 oz of hard liquor.  Do not drink on an empty stomach.  Keep yourself hydrated with water, diet soda, or unsweetened iced tea.  Keep in mind that regular soda, juice, and other mixers may contain a lot of sugar and must be counted as carbs. What are tips for following this plan?  Reading food labels  Start by checking the serving size on the "Nutrition Facts" label of packaged foods and drinks. The amount of calories, carbs, fats, and other nutrients listed on the label is based on one serving of the item. Many items contain more than one serving per package.  Check the total grams (g) of carbs in one serving. You can calculate the number of servings of carbs in one serving by dividing the total carbs by 15. For example, if a food has 30 g of total carbs, it would be equal to 2 servings of carbs.  Check the number of grams (g) of saturated and trans fats in one serving. Choose foods that have low or no amount of these fats.  Check the number of milligrams (mg) of salt (sodium) in one serving. Most people should limit total sodium intake to less than 2,300 mg per day.  Always check the nutrition information of foods labeled as "low-fat" or "nonfat". These foods may be higher in added sugar or refined carbs and should be avoided.  Talk to your dietitian  to identify your daily goals for nutrients listed on the label. Shopping  Avoid buying canned, premade, or processed foods. These foods tend to be high in fat, sodium, and added sugar.  Shop around the outside edge of the grocery store. This includes fresh fruits and vegetables, bulk grains, fresh meats, and fresh dairy. Cooking  Use low-heat cooking methods, such as baking, instead of high-heat cooking methods like deep frying.  Cook using healthy oils, such as olive, canola, or sunflower oil.  Avoid cooking with butter, cream, or high-fat meats. Meal planning  Eat meals and snacks regularly, preferably at the same times every day. Avoid going long periods of time without eating.  Eat foods high in fiber, such as fresh fruits, vegetables, beans, and whole grains. Talk to your dietitian about how many servings of carbs you can eat at each meal.  Eat 4-6 ounces (oz) of lean protein each day, such as lean meat, chicken, fish, eggs, or tofu. One oz of lean protein is equal to: ? 1 oz of meat, chicken, or fish. ? 1 egg. ?  cup of tofu.  Eat some  foods each day that contain healthy fats, such as avocado, nuts, seeds, and fish. Lifestyle  Check your blood glucose regularly.  Exercise regularly as told by your health care provider. This may include: ? 150 minutes of moderate-intensity or vigorous-intensity exercise each week. This could be brisk walking, biking, or water aerobics. ? Stretching and doing strength exercises, such as yoga or weightlifting, at least 2 times a week.  Take medicines as told by your health care provider.  Do not use any products that contain nicotine or tobacco, such as cigarettes and e-cigarettes. If you need help quitting, ask your health care provider.  Work with a Social worker or diabetes educator to identify strategies to manage stress and any emotional and social challenges. Questions to ask a health care provider  Do I need to meet with a diabetes  educator?  Do I need to meet with a dietitian?  What number can I call if I have questions?  When are the best times to check my blood glucose? Where to find more information:  American Diabetes Association: diabetes.org  Academy of Nutrition and Dietetics: www.eatright.[employer] of Diabetes and Digestive and Kidney Diseases (NIH): [employer] Summary  A healthy meal plan will help you control your blood glucose and maintain a healthy lifestyle.  Working with a diet and nutrition specialist (dietitian) can help you make a meal plan that is best for you.  Keep in mind that carbohydrates (carbs) and alcohol have immediate effects on your blood glucose levels. It is important to count carbs and to use alcohol carefully. This information is not intended to replace advice given to you by your health care provider. Make sure you discuss any questions you have with your health care provider. Document Revised: 04/02/2017 Document Reviewed: 05/25/2016 Elsevier Patient Education  2020 [employer].

## 2019-07-15 ENCOUNTER — Ambulatory Visit: Payer: Medicare Other | Attending: Internal Medicine

## 2019-07-15 DIAGNOSIS — Z23 Encounter for immunization: Secondary | ICD-10-CM

## 2019-07-15 NOTE — Addendum Note (Signed)
Addended by: Karle Barr on: 07/15/2019 09:31 AM   Modules accepted: Orders

## 2019-07-15 NOTE — Progress Notes (Signed)
   Covid-19 Vaccination Clinic  Name:  Sharon Knight    MRN: [id number] DOB: 10-01-1945  07/15/2019  Ms. Demoulin was observed post Covid-19 immunization for 15 minutes without incident. She was provided with Vaccine Information Sheet and instruction to access the V-Safe system.   Ms. Kras was instructed to call 911 with any severe reactions post vaccine: Marland Kitchen Difficulty breathing  . Swelling of face and throat  . A fast heartbeat  . A bad rash all over body  . Dizziness and weakness   Immunizations Administered    Name Date Dose VIS Date Route   Pfizer COVID-19 Vaccine 07/15/2019  3:22 PM 0.3 mL 04/14/2019 Intramuscular   Manufacturer: [place]   Lot: [id number]   [place]: [id number]

## 2019-08-02 ENCOUNTER — Other Ambulatory Visit: Payer: Self-pay | Admitting: Internal Medicine

## 2019-08-02 ENCOUNTER — Other Ambulatory Visit: Payer: Self-pay | Admitting: General Practice

## 2019-08-02 ENCOUNTER — Other Ambulatory Visit: Payer: Self-pay | Admitting: Cardiovascular Disease

## 2019-08-09 ENCOUNTER — Ambulatory Visit: Payer: Medicare Other | Attending: Internal Medicine

## 2019-08-09 DIAGNOSIS — Z23 Encounter for immunization: Secondary | ICD-10-CM

## 2019-08-09 NOTE — Progress Notes (Signed)
   Covid-19 Vaccination Clinic  Name:  Gizelle Zigman    MRN: [id number] DOB: 10-13-1945  08/09/2019  Ms. Froio was observed post Covid-19 immunization for 15 minutes without incident. She was provided with Vaccine Information Sheet and instruction to access the V-Safe system.   Ms. Stansfield was instructed to call 911 with any severe reactions post vaccine: Marland Kitchen Difficulty breathing  . Swelling of face and throat  . A fast heartbeat  . A bad rash all over body  . Dizziness and weakness   Immunizations Administered    Name Date Dose VIS Date Route   Pfizer COVID-19 Vaccine 08/09/2019 11:23 AM 0.3 mL 04/14/2019 Intramuscular   Manufacturer: [place]   Lot: [id number]   [place]: [id number]

## 2019-08-31 ENCOUNTER — Other Ambulatory Visit: Payer: Self-pay | Admitting: Internal Medicine

## 2019-09-06 DIAGNOSIS — H35341 Macular cyst, hole, or pseudohole, right eye: Secondary | ICD-10-CM | POA: Diagnosis not present

## 2019-09-06 DIAGNOSIS — E113291 Type 2 diabetes mellitus with mild nonproliferative diabetic retinopathy without macular edema, right eye: Secondary | ICD-10-CM | POA: Diagnosis not present

## 2019-09-06 DIAGNOSIS — H35013 Changes in retinal vascular appearance, bilateral: Secondary | ICD-10-CM | POA: Diagnosis not present

## 2019-09-06 DIAGNOSIS — H43812 Vitreous degeneration, left eye: Secondary | ICD-10-CM | POA: Diagnosis not present

## 2019-09-13 NOTE — Patient Instructions (Addendum)
You can take vitamin B12 daily - take 500 mcg daily  Increase your walking.    Blood work was ordered.     Medications reviewed and updated.  Changes include :  none      Please followup in 6 months

## 2019-09-13 NOTE — Progress Notes (Signed)
Subjective:    Patient ID: Sharon Knight, female    DOB: March 04, 1946, 74 y.o.   MRN: [id number]  HPI The patient is here for follow up of their chronic medical problems, including Afib, hypertension, diabetes, hyperlipidemia, CKD.   She is taking all of her medications as prescribed.   She is not exercising regularly.   She does not eat as well as she should.   She fell the other day and has some bruising and scraps on her legs.  She bent over and just lost her balance.     Medications and allergies reviewed with patient and updated if appropriate.  Patient Active Problem List   Diagnosis Date Noted  . Trigger finger, right ring finger 03/17/2019  . CKD (chronic kidney disease) stage 3, GFR 30-59 ml/min 03/16/2019  . Diabetes mellitus without complication ([place]) 123XX123  . Bilateral low back pain 03/17/2018  . Gout 12/21/2017  . Lobar pneumonia ([place]) 12/10/2017  . Paroxysmal atrial fibrillation ([place]) 12/10/2017  . History of colonic polyps 10/22/2014  . 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 Vitamin D deficiency 10/31/2012  . ECZEMA 03/03/2010  . 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 Hyperlipidemia 08/02/2007  . Essential hypertension 08/02/2007  . Osteopenia 08/02/2007    Current Outpatient Medications on File Prior to Visit  Medication Sig Dispense Refill  . atorvastatin (LIPITOR) 20 MG tablet Take 0.5 tablets (10 mg total) by mouth daily. 90 tablet 3  . diltiazem (CARDIZEM CD) 180 MG 24 hr capsule TAKE (1) CAPSULE DAILY. 30 capsule 11  . Docusate Sodium (STOOL SOFTENER LAXATIVE PO) Take 3-4 capsules by mouth daily.    Marland Kitchen ELIQUIS 5 MG TABS tablet TAKE 1 TABLET BY MOUTH TWICE DAILY. 60 tablet 0  . flecainide (TAMBOCOR) 100 MG tablet Take 1 tablet (100 mg total) by mouth 2 (two) times daily. Please make overdue appt with Dr. Curt Bears before anymore refills. 1st attempt 180 tablet 3  . hydrochlorothiazide (HYDRODIURIL) 25 MG tablet TAKE 1 TABLET EACH DAY. 90  tablet 0  . Multiple Vitamin (MULTIVITAMIN) tablet Take 1 tablet by mouth daily.    Marland Kitchen olmesartan (BENICAR) 5 MG tablet TAKE 1 TABLET ONCE DAILY. 60 tablet 0   No current facility-administered medications on file prior to visit.    Past Medical History:  Diagnosis Date  . Arthritis   . Bilateral cataracts    Dr Kathrin Penner  . Hyperlipidemia   . Hypertension     Past Surgical History:  Procedure Laterality Date  . COLONOSCOPY     Dr Fuller Plan  . 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 OVARIAN CYST REMOVAL      Social History   Socioeconomic History  . Marital status: Married    Spouse name: Not on file  . Number of children: Not on file  . Years of education: Not on file  . Highest education level: Not on file  Occupational History  . Not on file  Tobacco Use  . Smoking status: Former Smoker    Quit date: 08/27/1984    Years since quitting: 35.0  . Smokeless tobacco: Never Used  . Tobacco comment: smoked age [age], up to 1.5-2 ppd  Substance and Sexual Activity  . Alcohol use: Yes    Alcohol/week: 3.0 standard drinks    Types: 3 Cans of beer per week    Comment: 3 x weekly  . Drug use: No  . Sexual activity: Not on file  Other  Topics Concern  . Not on file  Social History Narrative   Exercise: none   Social Determinants of Health   Financial Resource Strain:   . Difficulty of Paying Living Expenses:   Food Insecurity:   . Worried About Charity fundraiser in the Last Year:   . Arboriculturist in the Last Year:   Transportation Needs:   . Film/video editor (Medical):   Marland Kitchen Lack of Transportation (Non-Medical):   Physical Activity:   . Days of Exercise per Week:   . Minutes of Exercise per Session:   Stress:   . Feeling of Stress :   Social Connections:   . Frequency of Communication with Friends and Family:   . Frequency of Social Gatherings with Friends and Family:   . Attends Religious Services:   . Active Member of Clubs or Organizations:     . Attends Archivist Meetings:   Marland Kitchen Marital Status:     Family History  Adopted: Yes  Problem Relation Age of Onset  . Colon cancer Neg Hx   . Stomach cancer Neg Hx   . Breast cancer Neg Hx     Review of Systems  Constitutional: Negative for chills and fever.  Respiratory: Negative for cough, shortness of breath and wheezing.   Cardiovascular: Negative for chest pain, palpitations and leg swelling.  Neurological: Positive for light-headedness (occ). Negative for headaches.       Objective:   Vitals:   09/14/19 1104  BP: 124/70  Pulse: 61  Resp: 16  Temp: 98.2 F (36.8 C)  SpO2: 99%   BP Readings from Last 3 Encounters:  09/14/19 124/70  09/14/19 124/70  04/18/19 132/75   Wt Readings from Last 3 Encounters:  09/14/19 172 lb (78 kg)  09/14/19 172 lb 6.4 oz (78.2 kg)  04/18/19 174 lb 9.6 oz (79.2 kg)   Body mass index is 27.76 kg/m.   Physical Exam    Constitutional: Appears well-developed and well-nourished. No distress.  HENT:  Head: Normocephalic and atraumatic.  Neck: Neck supple. No tracheal deviation present. No thyromegaly present.  No cervical lymphadenopathy Cardiovascular: Normal rate, regular rhythm and normal heart sounds.  2/6 systolic murmur heard. No carotid bruit .  No edema Pulmonary/Chest: Effort normal and breath sounds normal. No respiratory distress. No has no wheezes. No rales.  Skin: Skin is warm and dry. Not diaphoretic.  Psychiatric: Normal mood and affect. Behavior is normal.      Assessment & Plan:    See Problem List for Assessment and Plan of chronic medical problems.    This visit occurred during the SARS-CoV-2 public health emergency.  Safety protocols were in place, including screening questions prior to the visit, additional usage of staff PPE, and extensive cleaning of exam room while observing appropriate contact time as indicated for disinfecting solutions.

## 2019-09-14 ENCOUNTER — Other Ambulatory Visit: Payer: Self-pay

## 2019-09-14 ENCOUNTER — Ambulatory Visit (INDEPENDENT_AMBULATORY_CARE_PROVIDER_SITE_OTHER): Payer: Medicare Other

## 2019-09-14 ENCOUNTER — Encounter: Payer: Self-pay | Admitting: Internal Medicine

## 2019-09-14 ENCOUNTER — Ambulatory Visit (INDEPENDENT_AMBULATORY_CARE_PROVIDER_SITE_OTHER): Payer: Medicare Other | Admitting: Internal Medicine

## 2019-09-14 VITALS — BP 124/70 | HR 61 | Temp 98.2°F | Resp 16 | Ht 66.0 in | Wt 172.4 lb

## 2019-09-14 VITALS — BP 124/70 | HR 61 | Temp 98.2°F | Resp 16 | Ht 66.0 in | Wt 172.0 lb

## 2019-09-14 DIAGNOSIS — I480 Paroxysmal atrial fibrillation: Secondary | ICD-10-CM

## 2019-09-14 DIAGNOSIS — Z0000 Encounter for general adult medical examination without abnormal findings: Secondary | ICD-10-CM

## 2019-09-14 DIAGNOSIS — N1831 Chronic kidney disease, stage 3a: Secondary | ICD-10-CM

## 2019-09-14 DIAGNOSIS — E1122 Type 2 diabetes mellitus with diabetic chronic kidney disease: Secondary | ICD-10-CM | POA: Diagnosis not present

## 2019-09-14 DIAGNOSIS — E7849 Other hyperlipidemia: Secondary | ICD-10-CM

## 2019-09-14 DIAGNOSIS — I10 Essential (primary) hypertension: Secondary | ICD-10-CM

## 2019-09-14 DIAGNOSIS — E119 Type 2 diabetes mellitus without complications: Secondary | ICD-10-CM

## 2019-09-14 LAB — COMPREHENSIVE METABOLIC PANEL
ALT: 22 U/L (ref 0–35)
AST: 23 U/L (ref 0–37)
Albumin: 4.6 g/dL (ref 3.5–5.2)
Alkaline Phosphatase: 90 U/L (ref 39–117)
BUN: 18 mg/dL (ref 6–23)
CO2: 31 mEq/L (ref 19–32)
Calcium: 10 mg/dL (ref 8.4–10.5)
Chloride: 102 mEq/L (ref 96–112)
Creatinine, Ser: 1.07 mg/dL (ref 0.40–1.20)
GFR: 49.97 mL/min — ABNORMAL LOW (ref >60.00)
Glucose, Bld: 97 mg/dL (ref 70–99)
Potassium: 4.5 mEq/L (ref 3.5–5.1)
Sodium: 141 mEq/L (ref 135–145)
Total Bilirubin: 0.5 mg/dL (ref 0.2–1.2)
Total Protein: 7.7 g/dL (ref 6.0–8.3)

## 2019-09-14 LAB — CBC WITH DIFFERENTIAL/PLATELET
Basophils Absolute: 0.1 10*3/uL (ref 0.0–0.1)
Basophils Relative: 1.3 % (ref 0.0–3.0)
Eosinophils Absolute: 0.3 10*3/uL (ref 0.0–0.7)
Eosinophils Relative: 3.5 % (ref 0.0–5.0)
HCT: 42.5 % (ref 36.0–46.0)
Hemoglobin: 14.4 g/dL (ref 12.0–15.0)
Lymphocytes Relative: 16.7 % (ref 12.0–46.0)
Lymphs Abs: 1.4 10*3/uL (ref 0.7–4.0)
MCHC: 33.9 g/dL (ref 30.0–36.0)
MCV: 92 fl (ref 78.0–100.0)
Monocytes Absolute: 0.9 10*3/uL (ref 0.1–1.0)
Monocytes Relative: 9.9 % (ref 3.0–12.0)
Neutro Abs: 5.9 10*3/uL (ref 1.4–7.7)
Neutrophils Relative %: 68.6 % (ref 43.0–77.0)
Platelets: 320 10*3/uL (ref 150.0–400.0)
RBC: 4.62 Mil/uL (ref 3.87–5.11)
RDW: 12.4 % (ref 11.5–15.5)
WBC: 8.7 10*3/uL (ref 4.0–10.5)

## 2019-09-14 LAB — LIPID PANEL
Cholesterol: 172 mg/dL (ref 0–200)
HDL: 62.6 mg/dL (ref >39.00)
LDL Cholesterol: 88 mg/dL (ref 0–99)
NonHDL: 109.65
Total CHOL/HDL Ratio: 3
Triglycerides: 108 mg/dL (ref 0.0–149.0)
VLDL: 21.6 mg/dL (ref 0.0–40.0)

## 2019-09-14 LAB — HEMOGLOBIN A1C: Hgb A1c MFr Bld: 6.7 % — ABNORMAL HIGH (ref 4.6–6.5)

## 2019-09-14 NOTE — Assessment & Plan Note (Signed)
Chronic °Diet controlled °Check a1c °Low sugar / carb diet °Stressed regular exercise °Encouraged weight loss ° ° °

## 2019-09-14 NOTE — Assessment & Plan Note (Signed)
Chronic BP well controlled Current regimen effective and well tolerated Continue current medications at current doses cmp  

## 2019-09-14 NOTE — Patient Instructions (Addendum)
Sharon Knight , Thank you for taking time to come for your Medicare Wellness Visit. I appreciate your ongoing commitment to your health goals. Please review the following plan we discussed and let me know if I can assist you in the future.   Screening recommendations/referrals: Colorectal Screening: 09/11/2011; due 09/09/2021 please call Gastro to schedule. Mammogram: 02/15/2019 Bone Density: 07/03/2015  Vision and Dental Exams: Recommended annual ophthalmology exams for early detection of glaucoma and other disorders of the eye Recommended annual dental exams for proper oral hygiene  Diabetic Exams: Diabetic Eye Exam: 12/01/2018 Diabetic Foot Exam: 03/17/2019  Vaccinations: Influenza vaccine: 01/21/2019 Pneumococcal vaccine: completed; 02/12/2015, 02/19/2017 Tdap vaccine: 03/03/2010; due every ten years Shingles vaccine: Please call your insurance company to determine your out of pocket expense for the Shingrix vaccine. You may receive this vaccine at your local pharmacy. Covid vaccine: completed [place] 07/15/2019, 08/09/2019  Advanced directives: Advance directives discussed with you today. I have provided a copy for you to complete at home and have notarized. Once this is complete please bring a copy in to our office so we can scan it into your chart. Per patient has paperwork at home.  Will try to complete.  Goals:  Recommend to drink at least 6-8 8oz glasses of water per day.  Recommend to exercise for at least 150 minutes per week.  Recommend to remove any items from the home that may cause slips or trips.  Recommend to decrease portion sizes by eating 3 small healthy meals and at least 2 healthy snacks per day.  Recommend to begin DASH diet as directed below  Recommend to continue efforts to reduce smoking habits until no longer smoking. Smoking Cessation literature is attached below.  Next appointment: Please schedule your Annual Wellness Visit with your Nurse Health Advisor in one  year.  Preventive Care 75 Years and Older, Female Preventive care refers to lifestyle choices and visits with your health care provider that can promote health and wellness. What does preventive care include?  A yearly physical exam. This is also called an annual well check.  Dental exams once or twice a year.  Routine eye exams. Ask your health care provider how often you should have your eyes checked.  Personal lifestyle choices, including:  Daily care of your teeth and gums.  Regular physical activity.  Eating a healthy diet.  Avoiding tobacco and drug use.  Limiting alcohol use.  Practicing safe sex.  Taking low-dose aspirin every day if recommended by your health care provider.  Taking vitamin and mineral supplements as recommended by your health care provider. What happens during an annual well check? The services and screenings done by your health care provider during your annual well check will depend on your age, overall health, lifestyle risk factors, and family history of disease. Counseling  Your health care provider may ask you questions about your:  Alcohol use.  Tobacco use.  Drug use.  Emotional well-being.  Home and relationship well-being.  Sexual activity.  Eating habits.  History of falls.  Memory and ability to understand (cognition).  Work and work Statistician.  Reproductive health. Screening  You may have the following tests or measurements:  Height, weight, and BMI.  Blood pressure.  Lipid and cholesterol levels. These may be checked every 5 years, or more frequently if you are over 47 years old.  Skin check.  Lung cancer screening. You may have this screening every year starting at age 51 if you have a 30-pack-year history of  smoking and currently smoke or have quit within the past 15 years.  Fecal occult blood test (FOBT) of the stool. You may have this test every year starting at age 24.  Flexible sigmoidoscopy or  colonoscopy. You may have a sigmoidoscopy every 5 years or a colonoscopy every 10 years starting at age 2.  Hepatitis C blood test.  Hepatitis B blood test.  Sexually transmitted disease (STD) testing.  Diabetes screening. This is done by checking your blood sugar (glucose) after you have not eaten for a while (fasting). You may have this done every 1-3 years.  Bone density scan. This is done to screen for osteoporosis. You may have this done starting at age 89.  Mammogram. This may be done every 1-2 years. Talk to your health care provider about how often you should have regular mammograms. Talk with your health care provider about your test results, treatment options, and if necessary, the need for more tests. Vaccines  Your health care provider may recommend certain vaccines, such as:  Influenza vaccine. This is recommended every year.  Tetanus, diphtheria, and acellular pertussis (Tdap, Td) vaccine. You may need a Td booster every 10 years.  Zoster vaccine. You may need this after age 73.  Pneumococcal 13-valent conjugate (PCV13) vaccine. One dose is recommended after age 40.  Pneumococcal polysaccharide (PPSV23) vaccine. One dose is recommended after age 80. Talk to your health care provider about which screenings and vaccines you need and how often you need them. This information is not intended to replace advice given to you by your health care provider. Make sure you discuss any questions you have with your health care provider. Document Released: 05/17/2015 Document Revised: 01/08/2016 Document Reviewed: 02/19/2015 Elsevier Interactive Patient Education  2017 [place] Prevention in the Home Falls can cause injuries. They can happen to people of all ages. There are many things you can do to make your home safe and to help prevent falls. What can I do on the outside of my home?  Regularly fix the edges of walkways and driveways and fix any cracks.  Remove  anything that might make you trip as you walk through a door, such as a raised step or threshold.  Trim any bushes or trees on the path to your home.  Use bright outdoor lighting.  Clear any walking paths of anything that might make someone trip, such as rocks or tools.  Regularly check to see if handrails are loose or broken. Make sure that both sides of any steps have handrails.  Any raised decks and porches should have guardrails on the edges.  Have any leaves, snow, or ice cleared regularly.  Use sand or salt on walking paths during winter.  Clean up any spills in your garage right away. This includes oil or grease spills. What can I do in the bathroom?  Use night lights.  Install grab bars by the toilet and in the tub and shower. Do not use towel bars as grab bars.  Use non-skid mats or decals in the tub or shower.  If you need to sit down in the shower, use a plastic, non-slip stool.  Keep the floor dry. Clean up any water that spills on the floor as soon as it happens.  Remove soap buildup in the tub or shower regularly.  Attach bath mats securely with double-sided non-slip rug tape.  Do not have throw rugs and other things on the floor that can make you trip. What can  I do in the bedroom?  Use night lights.  Make sure that you have a light by your bed that is easy to reach.  Do not use any sheets or blankets that are too big for your bed. They should not hang down onto the floor.  Have a firm chair that has side arms. You can use this for support while you get dressed.  Do not have throw rugs and other things on the floor that can make you trip. What can I do in the kitchen?  Clean up any spills right away.  Avoid walking on wet floors.  Keep items that you use a lot in easy-to-reach places.  If you need to reach something above you, use a strong step stool that has a grab bar.  Keep electrical cords out of the way.  Do not use floor polish or wax that  makes floors slippery. If you must use wax, use non-skid floor wax.  Do not have throw rugs and other things on the floor that can make you trip. What can I do with my stairs?  Do not leave any items on the stairs.  Make sure that there are handrails on both sides of the stairs and use them. Fix handrails that are broken or loose. Make sure that handrails are as long as the stairways.  Check any carpeting to make sure that it is firmly attached to the stairs. Fix any carpet that is loose or worn.  Avoid having throw rugs at the top or bottom of the stairs. If you do have throw rugs, attach them to the floor with carpet tape.  Make sure that you have a light switch at the top of the stairs and the bottom of the stairs. If you do not have them, ask someone to add them for you. What else can I do to help prevent falls?  Wear shoes that:  Do not have high heels.  Have rubber bottoms.  Are comfortable and fit you well.  Are closed at the toe. Do not wear sandals.  If you use a stepladder:  Make sure that it is fully opened. Do not climb a closed stepladder.  Make sure that both sides of the stepladder are locked into place.  Ask someone to hold it for you, if possible.  Clearly mark and make sure that you can see:  Any grab bars or handrails.  First and last steps.  Where the edge of each step is.  Use tools that help you move around (mobility aids) if they are needed. These include:  Canes.  Walkers.  Scooters.  Crutches.  Turn on the lights when you go into a dark area. Replace any light bulbs as soon as they burn out.  Set up your furniture so you have a clear path. Avoid moving your furniture around.  If any of your floors are uneven, fix them.  If there are any pets around you, be aware of where they are.  Review your medicines with your doctor. Some medicines can make you feel dizzy. This can increase your chance of falling. Ask your doctor what other  things that you can do to help prevent falls. This information is not intended to replace advice given to you by your health care provider. Make sure you discuss any questions you have with your health care provider. Document Released: 02/14/2009 Document Revised: 09/26/2015 Document Reviewed: 05/25/2014 Elsevier Interactive Patient Education  2017 [employer].

## 2019-09-14 NOTE — Progress Notes (Signed)
Subjective:   Sharon Knight is a 74 y.o. female who presents for Medicare Annual (Subsequent) preventive examination.  Review of Systems:  No ROS. Medicare Wellness Visit. Additional risk factors are reflected in social history. Cardiac Risk Factors include: advanced age (>11men, >17 women);diabetes mellitus;dyslipidemia;hypertension  Sleep Patterns: Has issues with falling asleep, gets up multiple times during the night. Home Safety/Smoke Alarms: Feels safe in home; uses home alarm. Smoke alarms in place. Living environment: 1-story home. Lives with her husband, no needs for DME, good support system. Seat Belt Safety/Bike Helmet: Wears seat belt.    Objective:     Vitals: BP 124/70 (BP Location: Left Arm, Patient Position: Sitting, Cuff Size: Normal)   Pulse 61   Temp 98.2 F (36.8 C)   Resp 16   Ht 5\' 6"  (1.676 m)   Wt 172 lb 6.4 oz (78.2 kg)   SpO2 99%   BMI 27.83 kg/m   Body mass index is 27.83 kg/m.  Advanced Directives 09/14/2019 05/24/2018 12/10/2017 12/10/2017 02/19/2017 02/11/2015  Does Patient Have a Medical Advance Directive? No No No No No No  Would patient like information on creating a medical advance directive? No - Patient declined Yes (MAU/Ambulatory/Procedural Areas - Information given) No - Patient declined - No - Patient declined Yes - Scientist, clinical (histocompatibility and immunogenetics) given    Tobacco Social History   Tobacco Use  Smoking Status Former Smoker  . Quit date: 08/27/1984  . Years since quitting: 35.0  Smokeless Tobacco Never Used  Tobacco Comment   smoked age [age], up to 1.5-2 ppd     Counseling given: No Comment: smoked age [age], up to 1.5-2 ppd   Clinical Intake:  Pre-visit preparation completed: Yes  Pain : 0-10 Pain Score: 5  Pain Type: Chronic pain Pain Location: Sacrum Pain Descriptors / Indicators: Aching, Constant, Discomfort Pain Onset: More than a month ago Pain Frequency: Intermittent Pain Relieving Factors: Tylenol  Pain Relieving  Factors: Tylenol  BMI - recorded: 27.8 Nutritional Status: BMI 25 -29 Overweight Nutritional Risks: None Diabetes: Yes CBG done?: No Did pt. bring in CBG monitor from home?: No  How often do you need to have someone help you when you read instructions, pamphlets, or other written materials from your doctor or pharmacy?: 1 - Never What is the last grade level you completed in school?: HSG  Interpreter Needed?: No  Information entered by :: Dafney Farler N. Lowell Guitar, LPN  Past Medical History:  Diagnosis Date  . Arthritis   . Bilateral cataracts    Dr Kathrin Penner  . Hyperlipidemia   . Hypertension    Past Surgical History:  Procedure Laterality Date  . COLONOSCOPY     Dr Fuller Plan  . 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 OVARIAN CYST REMOVAL     Family History  Adopted: Yes  Problem Relation Age of Onset  . Colon cancer Neg Hx   . Stomach cancer Neg Hx   . Breast cancer Neg Hx    Social History   Socioeconomic History  . Marital status: Married    Spouse name: Not on file  . Number of children: Not on file  . Years of education: Not on file  . Highest education level: Not on file  Occupational History  . Not on file  Tobacco Use  . Smoking status: Former Smoker    Quit date: 08/27/1984    Years since quitting: 35.0  . Smokeless tobacco: Never Used  . Tobacco comment: smoked  age [age], up to 1.5-2 ppd  Substance and Sexual Activity  . Alcohol use: Yes    Alcohol/week: 3.0 standard drinks    Types: 3 Cans of beer per week    Comment: 3 x weekly  . Drug use: No  . Sexual activity: Not on file  Other Topics Concern  . Not on file  Social History Narrative   Exercise: none   Social Determinants of Health   Financial Resource Strain:   . Difficulty of Paying Living Expenses:   Food Insecurity:   . Worried About Charity fundraiser in the Last Year:   . Arboriculturist in the Last Year:   Transportation Needs:   . Film/video editor (Medical):   Marland Kitchen  Lack of Transportation (Non-Medical):   Physical Activity:   . Days of Exercise per Week:   . Minutes of Exercise per Session:   Stress:   . Feeling of Stress :   Social Connections:   . Frequency of Communication with Friends and Family:   . Frequency of Social Gatherings with Friends and Family:   . Attends Religious Services:   . Active Member of Clubs or Organizations:   . Attends Archivist Meetings:   Marland Kitchen Marital Status:     Outpatient Encounter Medications as of 09/14/2019  Medication Sig  . acetaminophen (TYLENOL) 650 MG CR tablet Take 650 mg by mouth every 8 (eight) hours as needed for pain.  Marland Kitchen atorvastatin (LIPITOR) 20 MG tablet Take 0.5 tablets (10 mg total) by mouth daily.  Marland Kitchen diltiazem (CARDIZEM CD) 180 MG 24 hr capsule TAKE (1) CAPSULE DAILY.  Marland Kitchen Docusate Sodium (STOOL SOFTENER LAXATIVE PO) Take 3-4 capsules by mouth daily.  Marland Kitchen ELIQUIS 5 MG TABS tablet TAKE 1 TABLET BY MOUTH TWICE DAILY.  . hydrochlorothiazide (HYDRODIURIL) 25 MG tablet TAKE 1 TABLET EACH DAY.  Marland Kitchen olmesartan (BENICAR) 5 MG tablet TAKE 1 TABLET ONCE DAILY.  . flecainide (TAMBOCOR) 100 MG tablet Take 1 tablet (100 mg total) by mouth 2 (two) times daily. Please make overdue appt with Dr. Curt Bears before anymore refills. 1st attempt  . Multiple Vitamin (MULTIVITAMIN) tablet Take 1 tablet by mouth daily.  . [DISCONTINUED] acetaminophen (TYLENOL) 500 MG tablet Take 650 mg by mouth in the morning and at bedtime.    No facility-administered encounter medications on file as of 09/14/2019.    Activities of Daily Living In your present state of health, do you have any difficulty performing the following activities: 09/14/2019  Hearing? Y  Vision? N  Difficulty concentrating or making decisions? Y  Walking or climbing stairs? N  Dressing or bathing? N  Doing errands, shopping? N  Preparing Food and eating ? N  Using the Toilet? N  In the past six months, have you accidently leaked urine? Y  Do you have  problems with loss of bowel control? N  Managing your Medications? N  Managing your Finances? N  Housekeeping or managing your Housekeeping? N  Some recent data might be hidden    Patient Care Team: Binnie Rail, MD as PCP - General (Internal Medicine) Skeet Latch, MD as PCP - Cardiology (Cardiology) Constance Haw, MD as PCP - Electrophysiology (Cardiology) Charlton Haws, [facility] as Pharmacist (Pharmacist)    Assessment:   This is a routine wellness examination for [place].  Exercise Activities and Dietary recommendations Current Exercise Habits: The patient does not participate in regular exercise at present, Exercise limited by: cardiac condition(s);Other - see  comments(fatigued)  Goals    . I want to start walking again     I will begin by trying to walk 1-2 times a week. Perhaps start to go to [facility] or [facility]    . Patient Stated     Would like to lose weight and try to be more active.    . patient to consider (pt-stated)     Will put some thought into what she can do; Stressors evaluated;  Gave information for [place] [employer] / Family information and training  Get help & support [place]  Message boards  Support groups  Education programs  [place], [id number] [place], [place] [address] P: [phone] F: [phone]  Families to call the 800 number to get information on all the resources in the area 24 hour 1 ([phone])[phone]  Can provide resources; Questions about Dementia; Support groups; Give the caregiver information regarding respite ([facility]) Call the [place] on Aging;as they oversee who gets the grant fund for certain areas (123XX123) Also have Tools to help navigate the needs of the patient  Also gave information on [place] Response program      . Pharmacy Care Plan     CARE PLAN ENTRY Current Barriers:  . Chronic Disease Management  support, education, and care coordination needs related to Atrial Fibrillation, HTN, HLD, and DMII  Pharmacist Clinical Goal(s):  Marland Kitchen Maintain BP < 140/90 . Maintain A1c < 7% . Ensure safety, efficacy, and affordability of medications  Interventions: . Comprehensive medication review performed. . Discussed diet and exercise goals for staying healthy . Consider patient assistance for Eliquis when coverage gap ("donut hole") is reached  Patient Self Care Activities:  . Self administers medications as prescribed, Calls pharmacy for medication refills, and Calls provider office for new concerns or questions  Initial goal documentation       Fall Risk Fall Risk  09/14/2019 03/17/2019 09/15/2018 03/17/2018 02/19/2017  Falls in the past year? 1 0 0 0 No  Number falls in past yr: 0 0 - 0 -  Injury with Fall? 0 - - - -  Risk for fall due to : Impaired balance/gait;Impaired vision - - - -  Follow up Falls evaluation completed;Education provided;Falls prevention discussed - - - -   Is the patient's home free of loose throw rugs in walkways, pet beds, electrical cords, etc?   yes      Grab bars in the bathroom? yes      Handrails on the stairs?   yes      Adequate lighting?   yes   Depression Screen PHQ 2/9 Scores 09/14/2019 03/17/2019 09/15/2018 03/17/2018  PHQ - 2 Score 1 0 0 0  PHQ- 9 Score 2 - - -  Exception Documentation - - - -     Cognitive Function MMSE - Mini Mental State Exam 02/19/2017  Orientation to time 5  Orientation to Place 5  Registration 3  Attention/ Calculation 4  Recall 2  Language- name 2 objects 2  Language- repeat 1  Language- follow 3 step command 3  Language- read & follow direction 1  Write a sentence 1  Copy design 1  Total score 28     6CIT Screen 09/14/2019  What Year? 0 points  What month? 0 points  What time? 0 points  Count back from 20 0 points  Months in reverse 0 points  Repeat phrase 0 points  Total  Score 0    Immunization History    Administered Date(s) Administered  . Fluad Quad(high Dose 65+) 01/21/2019  . Influenza Split 02/18/2011, 02/18/2012  . Influenza Whole 03/02/2007, 02/03/2008, 02/08/2009, 02/25/2010  . Influenza, High Dose Seasonal PF 02/14/2013, 02/05/2016, 02/03/2017, 02/15/2018  . Influenza,inj,Quad PF,6+ Mos 02/20/2014, 02/11/2015  . Influenza-Unspecified 02/02/2016  . PFIZER SARS-COV-2 Vaccination 07/15/2019, 08/09/2019  . Pneumococcal Conjugate-13 02/12/2015  . Pneumococcal Polysaccharide-23 02/19/2017  . Td 03/03/2010  . Zoster 11/02/2012    Qualifies for Shingles Vaccine? Yes, will check with local pharmacy.  Screening Tests Health Maintenance  Topic Date Due  . HEMOGLOBIN A1C  09/14/2019  . DEXA SCAN  09/15/2019 (Originally 07/02/2017)  . OPHTHALMOLOGY EXAM  12/01/2019  . INFLUENZA VACCINE  12/03/2019  . TETANUS/TDAP  03/03/2020  . FOOT EXAM  03/16/2020  . COLONOSCOPY  09/10/2021  . COVID-19 Vaccine  Completed  . Hepatitis C Screening  Completed  . PNA vac Low Risk Adult  Completed    Cancer Screenings: Lung: Low Dose CT Chest recommended if Age 56-80 years, 30 pack-year currently smoking OR have quit w/in 15years. Patient does not qualify. Breast:  Up to date on Mammogram? Yes   Up to date of Bone Density/Dexa? Yes Colorectal: Yes, due 09/09/2021     Plan:     Reviewed health maintenance screenings with patient today and relevant education, vaccines, and/or referrals were provided.    Continue doing brain stimulating activities (puzzles, reading, adult coloring books, staying active) to keep memory sharp.    Continue to eat heart healthy diet (full of fruits, vegetables, whole grains, lean protein, water--limit salt, fat, and sugar intake) and increase physical activity as tolerated.  I have personally reviewed and noted the following in the patient's chart:   . Medical and social history . Use of alcohol, tobacco or illicit drugs  . Current medications and  supplements . Functional ability and status . Nutritional status . Physical activity . Advanced directives . List of other physicians . Hospitalizations, surgeries, and ER visits in previous 12 months . Vitals . Screenings to include cognitive, depression, and falls . Referrals and appointments  In addition, I have reviewed and discussed with patient certain preventive protocols, quality metrics, and best practice recommendations. A written personalized care plan for preventive services as well as general preventive health recommendations were provided to patient.     Sheral Flow, LPN  [id number]  Nurse Health Advisor

## 2019-09-14 NOTE — Assessment & Plan Note (Signed)
Chronic Asymptomatic Follows with cardiology On eliquis, flecainide, cardizem Cbc, cmp

## 2019-09-14 NOTE — Assessment & Plan Note (Signed)
Chronic Check lipid panel  Continue daily statin Regular exercise and healthy diet encouraged  

## 2019-09-14 NOTE — Assessment & Plan Note (Signed)
Chronic cmp 

## 2019-10-01 ENCOUNTER — Other Ambulatory Visit: Payer: Self-pay | Admitting: Internal Medicine

## 2019-10-09 ENCOUNTER — Other Ambulatory Visit: Payer: Self-pay | Admitting: Cardiovascular Disease

## 2019-11-01 ENCOUNTER — Other Ambulatory Visit: Payer: Self-pay | Admitting: Internal Medicine

## 2019-11-01 ENCOUNTER — Other Ambulatory Visit: Payer: Self-pay | Admitting: General Practice

## 2019-11-01 DIAGNOSIS — H02054 Trichiasis without entropian left upper eyelid: Secondary | ICD-10-CM | POA: Diagnosis not present

## 2019-11-01 DIAGNOSIS — H02402 Unspecified ptosis of left eyelid: Secondary | ICD-10-CM | POA: Diagnosis not present

## 2019-11-01 DIAGNOSIS — H01004 Unspecified blepharitis left upper eyelid: Secondary | ICD-10-CM | POA: Diagnosis not present

## 2019-11-14 ENCOUNTER — Other Ambulatory Visit: Payer: Self-pay

## 2019-11-14 ENCOUNTER — Ambulatory Visit: Payer: Medicare Other | Admitting: Cardiovascular Disease

## 2019-11-14 ENCOUNTER — Encounter: Payer: Self-pay | Admitting: Cardiovascular Disease

## 2019-11-14 VITALS — BP 122/62 | HR 58 | Temp 98.1°F | Ht 65.0 in | Wt 173.4 lb

## 2019-11-14 DIAGNOSIS — I10 Essential (primary) hypertension: Secondary | ICD-10-CM | POA: Diagnosis not present

## 2019-11-14 DIAGNOSIS — I480 Paroxysmal atrial fibrillation: Secondary | ICD-10-CM | POA: Diagnosis not present

## 2019-11-14 NOTE — Patient Instructions (Signed)
Medication Instructions:  Your physician recommends that you continue on your current medications as directed. Please refer to the Current Medication list given to you today.  *If you need a refill on your cardiac medications before your next appointment, please call your pharmacy*  Lab Work: NONE  Testing/Procedures: NONE  Follow-Up: At [facility], you and your health needs are our priority.  As part of our continuing mission to provide you with exceptional heart care, we have created designated Provider Care Teams.  These Care Teams include your primary Cardiologist (physician) and Advanced Practice Providers (APPs -  Physician Assistants and Nurse Practitioners) who all work together to provide you with the care you need, when you need it.  We recommend signing up for the patient portal called "MyChart".  Sign up information is provided on this After Visit Summary.  MyChart is used to connect with patients for Virtual Visits (Telemedicine).  Patients are able to view lab/test results, encounter notes, upcoming appointments, etc.  Non-urgent messages can be sent to your provider as well.   To learn more about what you can do with MyChart, go to [email or web address].    Your next appointment:   12 month(s)  The format for your next appointment:   In Person  Provider:   You may see Tiffany [place], MD or one of the following Advanced Practice Providers on your designated Care Team:    Luke Kilroy, PA-C  Callie Goodrich, PA-C  Jesse Cleaver, FNP     

## 2019-11-14 NOTE — Progress Notes (Signed)
Cardiology Office Note   Date:  11/14/2019   ID:  Sharon Knight, DOB July 22, 1945, MRN [id number]  PCP:  Binnie Rail, MD  Cardiologist:   Skeet Latch, MD   No chief complaint on file.    History of Present Illness: Sharon Knight is a 74 y.o. female with paroxysmal atrial fibrillation, hypertension, hyperlipidemia and right nephrectomywho presents for follow up.  She was admitted 12/2017 with atrial fibrillation with RVR in the setting of sepsis and pneumonia.  She was started on Eliquis and her home dose of metoprolol was increased.  Echocardiogram 12/11/2017 revealed LVEF 65 to 70% with moderate LVH.  Her left atrium was not dilated.  She followed up with Almyra Deforest, PA on 12/22/17 and was back in sinus rhythm.  She had a 30-day event monitor that did not reveal recurrent atrial fibrillation.  She followed up with Dr. Curt Bears 12/30/2017 and was still in atrial fibrillation at that time.  She was started on flecainide and metoprolol was discontinued.  She continued to wear her monitor and had multiple episodes of pauses overnight.  She was seen by Dr. Curt Bears in the ED 01/06/2018 and her evening dose of metoprolol was discontinued.  Sharon Knight was dizzy and bradycardic so metoprolol was discontinued.  Her BP was poorly controlled and HCTZ was increased to 25mg .  Valsartan was then added and it was switched to olmesartan due to drug shortage.  She thinks it is a little more effective with her BP.    Lately she has been feeling well.  She struggles with getting around because of weakness.  Her husband had to help her out the tub this AM.  She finds herself leaning forward and fell the other day while gardening.  She has no chest pain or shortness of breath.  She wants to walk but gets tired.  She complains of not having stamina so she stopped walking.  She notes that her heart racs if she is anxious or if she gets very hot.  She hasn't had any episodes of atrial fibrillation.  Lately her BP has been  stable at home.  She notes that she struggles with her memory lately.     Past Medical History:  Diagnosis Date  . Arthritis   . Bilateral cataracts    Dr Kathrin Penner  . Hyperlipidemia   . Hypertension     Past Surgical History:  Procedure Laterality Date  . COLONOSCOPY     Dr Fuller Plan  . 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 OVARIAN CYST REMOVAL       Current Outpatient Medications  Medication Sig Dispense Refill  . acetaminophen (TYLENOL) 650 MG CR tablet Take 650 mg by mouth every 8 (eight) hours as needed for pain.    Marland Kitchen atorvastatin (LIPITOR) 20 MG tablet Take 0.5 tablets (10 mg total) by mouth daily. 90 tablet 3  . diltiazem (CARDIZEM CD) 180 MG 24 hr capsule TAKE (1) CAPSULE DAILY. 30 capsule 11  . Docusate Sodium (STOOL SOFTENER LAXATIVE PO) Take 3-4 capsules by mouth daily.    Marland Kitchen ELIQUIS 5 MG TABS tablet TAKE 1 TABLET BY MOUTH TWICE DAILY. 60 tablet 5  . flecainide (TAMBOCOR) 100 MG tablet Take 1 tablet (100 mg total) by mouth 2 (two) times daily. Please make overdue appt with Dr. Curt Bears before anymore refills. 1st attempt 180 tablet 3  . hydrochlorothiazide (HYDRODIURIL) 25 MG tablet TAKE 1 TABLET EACH DAY. 90 tablet 1  .  Multiple Vitamin (MULTIVITAMIN) tablet Take 1 tablet by mouth daily.    Marland Kitchen olmesartan (BENICAR) 5 MG tablet TAKE 1 TABLET ONCE DAILY. 90 tablet 1   No current facility-administered medications for this visit.    Allergies:   Ibuprofen    Social History:  The patient  reports that she quit smoking about 35 years ago. She has never used smokeless tobacco. She reports current alcohol use of about 3.0 standard drinks of alcohol per week. She reports that she does not use drugs.   Family History:  The patient's family history is not on file. She was adopted.    ROS:  Please see the history of present illness.   Otherwise, review of systems are positive for none.   All other systems are reviewed and negative.    PHYSICAL EXAM: VS:  BP 122/62    Pulse (!) 58   Temp 98.1 F (36.7 C)   Ht 5\' 5"  (1.651 m)   Wt 173 lb 6.4 oz (78.7 kg)   SpO2 97%   BMI 28.86 kg/m  , BMI Body mass index is 28.86 kg/m. GENERAL:  Well appearing HEENT: Pupils equal round and reactive, fundi not visualized, oral mucosa unremarkable NECK:  No jugular venous distention, waveform within normal limits, carotid upstroke brisk and symmetric, no bruits LUNGS:  Clear to auscultation bilaterally HEART:  RRR.  PMI not displaced or sustained,S1 and S2 within normal limits, no S3, no S4, no clicks, no rubs, II/VI systolic murmur at the LUSB ABD:  Flat, positive bowel sounds normal in frequency in pitch, no bruits, no rebound, no guarding, no midline pulsatile mass, no hepatomegaly, no splenomegaly EXT:  2 plus pulses throughout, no edema, no cyanosis no clubbing SKIN:  No rashes no nodules NEURO:  Cranial nerves II through XII grossly intact, motor grossly intact throughout PSYCH:  Cognitively intact, oriented to person place and time  EKG:  EKG is ordered today. 11/14/19: Sinus bradcyardia.  Rate 58 bpm.  First degree AV block.    ETT 01/25/18: 4.6 METS on Bruce protocol.  No ischemia.  Echocardiogram 12/11/2017 Study Conclusions - Left ventricle: The cavity size was normal. Wall thickness was increased in a pattern of moderate LVH. Systolic function was vigorous. The estimated ejection fraction was in the range of 65% to 70%. Wall motion was normal; there were no regional wall motion abnormalities. The study was not technically sufficient to allow evaluation of LV diastolic dysfunction due to atrial fibrillation. - Aortic valve: Valve area (VTI): 2.14 cm^2. Valve area (Vmax): 2.43 cm^2. Valve area (Vmean): 2.33 cm^2. - Mitral valve: Valve area by continuity equation (using LVOT flow): 1.61 cm^2. - Atrial septum: No defect or patent foramen ovale was identified  30 Day Event Monitor 12/23/17:   Quality: Fair.  Baseline  artifact. Predominant rhythm: sinus rhythm Average heart rate: 68 bpm Max heart rate: 174 bpm Min heart rate: 38 bpm 199 pauses >3 seconds Pauses noted up to 6 seconds. All occurred over night or early morning. PVCs Atrial flutter 16% (71% controlled, <1% slow, 29% rapid)   Recent Labs: 03/17/2019: TSH 3.63 09/14/2019: ALT 22; BUN 18; Creatinine, Ser 1.07; Hemoglobin 14.4; Platelets 320.0; Potassium 4.5; Sodium 141    Lipid Panel    Component Value Date/Time   CHOL 172 09/14/2019 1133   CHOL 174 12/05/2012 1538   TRIG 108.0 09/14/2019 1133   TRIG 109 12/05/2012 1538   HDL 62.60 09/14/2019 1133   HDL 60 12/05/2012 1538   CHOLHDL 3  09/14/2019 1133   VLDL 21.6 09/14/2019 1133   LDLCALC 88 09/14/2019 1133   LDLCALC 92 12/05/2012 1538      Wt Readings from Last 3 Encounters:  11/14/19 173 lb 6.4 oz (78.7 kg)  09/14/19 172 lb (78 kg)  09/14/19 172 lb 6.4 oz (78.2 kg)      ASSESSMENT AND PLAN:  # Paroxysmal atrial flutter: # Sick sinus syndrome: She continues to maintain sinus rhythm.  No recent episodes of atrial fibrillation.  Continue flecainide, diltiazem, and Eliquis.  # Hypertension:  BP well-controlled.  Continue diltiazem, HCTZ, and olmesartan.   # Hyperlipidemia: LDL 88 on 09/2019. Continue atorvastatin.   # Memory loss:  Will consider referral to Neurology for an assessment of memory loss.  She will let us know.  Current medicines are reviewed at length with the patient today.  The patient does not have concerns regarding medicines.  The following changes have been made:   Labs/ tests ordered today include:   No orders of the defined types were placed in this encounter.    Disposition:   FU with Aydan Levitz C. Oval Linsey, MD, [facility] in 6 months.    Signed, Jontavius Rabalais C. Oval Linsey, MD, [facility]  11/14/2019 1:11 PM    [place]

## 2019-12-03 ENCOUNTER — Other Ambulatory Visit: Payer: Self-pay | Admitting: General Practice

## 2019-12-04 DIAGNOSIS — H43813 Vitreous degeneration, bilateral: Secondary | ICD-10-CM | POA: Diagnosis not present

## 2019-12-04 DIAGNOSIS — H02054 Trichiasis without entropian left upper eyelid: Secondary | ICD-10-CM | POA: Diagnosis not present

## 2019-12-04 DIAGNOSIS — E119 Type 2 diabetes mellitus without complications: Secondary | ICD-10-CM | POA: Diagnosis not present

## 2019-12-04 DIAGNOSIS — H04123 Dry eye syndrome of bilateral lacrimal glands: Secondary | ICD-10-CM | POA: Diagnosis not present

## 2019-12-04 LAB — HM DIABETES EYE EXAM

## 2019-12-06 ENCOUNTER — Other Ambulatory Visit: Payer: Self-pay | Admitting: Physician Assistant

## 2019-12-08 ENCOUNTER — Encounter: Payer: Self-pay | Admitting: *Deleted

## 2020-01-16 ENCOUNTER — Telehealth: Payer: Medicare Other

## 2020-01-16 ENCOUNTER — Other Ambulatory Visit: Payer: Self-pay | Admitting: Internal Medicine

## 2020-01-16 DIAGNOSIS — Z1231 Encounter for screening mammogram for malignant neoplasm of breast: Secondary | ICD-10-CM

## 2020-01-22 ENCOUNTER — Ambulatory Visit: Payer: Medicare Other | Admitting: Pharmacist

## 2020-01-22 ENCOUNTER — Other Ambulatory Visit: Payer: Self-pay

## 2020-01-22 DIAGNOSIS — E119 Type 2 diabetes mellitus without complications: Secondary | ICD-10-CM

## 2020-01-22 DIAGNOSIS — I480 Paroxysmal atrial fibrillation: Secondary | ICD-10-CM

## 2020-01-22 DIAGNOSIS — I10 Essential (primary) hypertension: Secondary | ICD-10-CM

## 2020-01-22 DIAGNOSIS — E7849 Other hyperlipidemia: Secondary | ICD-10-CM

## 2020-01-22 NOTE — Patient Instructions (Addendum)
Visit Information  Phone number for Pharmacist: [phone]  Goals Addressed            This Visit's Progress   . Pharmacy Care Plan   On track    CARE PLAN ENTRY Current Barriers:  . Chronic Disease Management support, education, and care coordination needs related to Atrial Fibrillation, HTN, HLD, and DMII  Pharmacist Clinical Goal(s): Over the next 180 days: Marland Kitchen Maintain BP < 140/90 . Maintain A1c < 7% . Ensure safety, efficacy, and affordability of medications  Interventions: . Comprehensive medication review performed. . Discussed diet and exercise goals for staying healthy . Consider patient assistance for Eliquis when coverage gap ("donut hole") is reached  Patient Self Care Activities:  . Self administers medications as prescribed, Calls pharmacy for medication refills, and Calls provider office for new concerns or questions  Please see past updates related to this goal by clicking on the "Past Updates" button in the selected goal       Patient verbalizes understanding of instructions provided today.  Telephone follow up appointment with pharmacy team member scheduled for: 6 months  Charlene Brooke, PharmD, BCACP Clinical Pharmacist [facility] Maintenance for Postmenopausal Women Menopause is a normal process in which your ability to get pregnant comes to an end. This process happens slowly over many months or years, usually between the ages of 84 and 10. Menopause is complete when you have missed your menstrual periods for 12 months. It is important to talk with your health care provider about some of the most common conditions that affect women after menopause (postmenopausal women). These include heart disease, cancer, and bone loss (osteoporosis). Adopting a healthy lifestyle and getting preventive care can help to promote your health and wellness. The actions you take can also lower your chances of developing some  of these common conditions. What should I know about menopause? During menopause, you may get a number of symptoms, such as:  Hot flashes. These can be moderate or severe.  Night sweats.  Decrease in sex drive.  Mood swings.  Headaches.  Tiredness.  Irritability.  Memory problems.  Insomnia. Choosing to treat or not to treat these symptoms is a decision that you make with your health care provider. Do I need hormone replacement therapy?  Hormone replacement therapy is effective in treating symptoms that are caused by menopause, such as hot flashes and night sweats.  Hormone replacement carries certain risks, especially as you become older. If you are thinking about using estrogen or estrogen with progestin, discuss the benefits and risks with your health care provider. What is my risk for heart disease and stroke? The risk of heart disease, heart attack, and stroke increases as you age. One of the causes may be a change in the body's hormones during menopause. This can affect how your body uses dietary fats, triglycerides, and cholesterol. Heart attack and stroke are medical emergencies. There are many things that you can do to help prevent heart disease and stroke. Watch your blood pressure  High blood pressure causes heart disease and increases the risk of stroke. This is more likely to develop in people who have high blood pressure readings, are of African descent, or are overweight.  Have your blood pressure checked: ? Every 3-5 years if you are 68-14 years of age. ? Every year if you are 56 years old or older. Eat a healthy diet   Eat a diet that includes plenty of vegetables, fruits,  low-fat dairy products, and lean protein.  Do not eat a lot of foods that are high in solid fats, added sugars, or sodium. Get regular exercise Get regular exercise. This is one of the most important things you can do for your health. Most adults should:  Try to exercise for at least  150 minutes each week. The exercise should increase your heart rate and make you sweat (moderate-intensity exercise).  Try to do strengthening exercises at least twice each week. Do these in addition to the moderate-intensity exercise.  Spend less time sitting. Even light physical activity can be beneficial. Other tips  Work with your health care provider to achieve or maintain a healthy weight.  Do not use any products that contain nicotine or tobacco, such as cigarettes, e-cigarettes, and chewing tobacco. If you need help quitting, ask your health care provider.  Know your numbers. Ask your health care provider to check your cholesterol and your blood sugar (glucose). Continue to have your blood tested as directed by your health care provider. Do I need screening for cancer? Depending on your health history and family history, you may need to have cancer screening at different stages of your life. This may include screening for:  Breast cancer.  Cervical cancer.  Lung cancer.  Colorectal cancer. What is my risk for osteoporosis? After menopause, you may be at increased risk for osteoporosis. Osteoporosis is a condition in which bone destruction happens more quickly than new bone creation. To help prevent osteoporosis or the bone fractures that can happen because of osteoporosis, you may take the following actions:  If you are 31-27 years old, get at least 1,000 mg of calcium and at least 600 mg of vitamin D per day.  If you are older than age 79 but younger than age 105, get at least 1,200 mg of calcium and at least 600 mg of vitamin D per day.  If you are older than age 7, get at least 1,200 mg of calcium and at least 800 mg of vitamin D per day. Smoking and drinking excessive alcohol increase the risk of osteoporosis. Eat foods that are rich in calcium and vitamin D, and do weight-bearing exercises several times each week as directed by your health care provider. How does menopause  affect my mental health? Depression may occur at any age, but it is more common as you become older. Common symptoms of depression include:  Low or sad mood.  Changes in sleep patterns.  Changes in appetite or eating patterns.  Feeling an overall lack of motivation or enjoyment of activities that you previously enjoyed.  Frequent crying spells. Talk with your health care provider if you think that you are experiencing depression. General instructions See your health care provider for regular wellness exams and vaccines. This may include:  Scheduling regular health, dental, and eye exams.  Getting and maintaining your vaccines. These include: ? Influenza vaccine. Get this vaccine each year before the flu season begins. ? Pneumonia vaccine. ? Shingles vaccine. ? Tetanus, diphtheria, and pertussis (Tdap) booster vaccine. Your health care provider may also recommend other immunizations. Tell your health care provider if you have ever been abused or do not feel safe at home. Summary  Menopause is a normal process in which your ability to get pregnant comes to an end.  This condition causes hot flashes, night sweats, decreased interest in sex, mood swings, headaches, or lack of sleep.  Treatment for this condition may include hormone replacement therapy.  Take  actions to keep yourself healthy, including exercising regularly, eating a healthy diet, watching your weight, and checking your blood pressure and blood sugar levels.  Get screened for cancer and depression. Make sure that you are up to date with all your vaccines. This information is not intended to replace advice given to you by your health care provider. Make sure you discuss any questions you have with your health care provider. Document Revised: 04/13/2018 Document Reviewed: 04/13/2018 Elsevier Patient Education  2020 [employer].

## 2020-01-22 NOTE — Chronic Care Management (AMB) (Signed)
Chronic Care Management Pharmacy  Name: Sharon Knight  MRN: [id number] DOB: [birth year]  Chief Complaint/ HPI  Sharon Knight,  74 y.o. , female presents for their Follow-Up CCM visit with the clinical pharmacist via telephone due to COVID-19 Pandemic.  PCP : Binnie Rail, MD  Their chronic conditions include: HTN, Afib, DM, CKD3 w/ hx nephrectomy, HLD, gout, back pain  Office Visits: 09/14/19 Dr [name] OV: chronic f/u w/ labs, no med changes.  03/17/19 Dr [name] OV: pt stable, no med changes.  Consult Visit: 11/14/19 Dr Oval Linsey (cardiology): f/u for Afib, stable, no med changes. Consider neuro referral for memory loss.  04/18/19 Dr Oval Linsey (cardiology): maintaining NSR, no med changes.   Allergies  Allergen Reactions  . Ibuprofen Other (See Comments)    Dizziness "made me woozy"   Medications: Outpatient Encounter Medications as of 01/22/2020  Medication Sig  . acetaminophen (TYLENOL) 650 MG CR tablet Take 650 mg by mouth every 8 (eight) hours as needed for pain.  Marland Kitchen atorvastatin (LIPITOR) 20 MG tablet TAKE (1/2) TABLET DAILY.  Marland Kitchen diltiazem (CARDIZEM CD) 180 MG 24 hr capsule TAKE (1) CAPSULE DAILY.  Marland Kitchen Docusate Sodium (STOOL SOFTENER LAXATIVE PO) Take 3-4 capsules by mouth daily.  Marland Kitchen ELIQUIS 5 MG TABS tablet TAKE 1 TABLET BY MOUTH TWICE DAILY.  . flecainide (TAMBOCOR) 100 MG tablet Take 1 tablet (100 mg total) by mouth 2 (two) times daily. Please make overdue appt with Dr. Curt Bears before anymore refills. 1st attempt  . hydrochlorothiazide (HYDRODIURIL) 25 MG tablet TAKE 1 TABLET EACH DAY.  . Multiple Vitamin (MULTIVITAMIN) tablet Take 1 tablet by mouth daily.  Marland Kitchen olmesartan (BENICAR) 5 MG tablet TAKE 1 TABLET ONCE DAILY.   No facility-administered encounter medications on file as of 01/22/2020.   Wt Readings from Last 3 Encounters:  11/14/19 173 lb 6.4 oz (78.7 kg)  09/14/19 172 lb (78 kg)  09/14/19 172 lb 6.4 oz (78.2 kg)    Current Diagnosis/Assessment:  Goals Addressed             This Visit's Progress   . Pharmacy Care Plan   On track    CARE PLAN ENTRY Current Barriers:  . Chronic Disease Management support, education, and care coordination needs related to Atrial Fibrillation, HTN, HLD, and DMII  Pharmacist Clinical Goal(s): Over the next 180 days: Marland Kitchen Maintain BP < 140/90 . Maintain A1c < 7% . Ensure safety, efficacy, and affordability of medications  Interventions: . Comprehensive medication review performed. . Discussed diet and exercise goals for staying healthy . Consider patient assistance for Eliquis when coverage gap ("donut hole") is reached  Patient Self Care Activities:  . Self administers medications as prescribed, Calls pharmacy for medication refills, and Calls provider office for new concerns or questions  Please see past updates related to this goal by clicking on the "Past Updates" button in the selected goal        Hypertension   BP goal < 130/80  BP Readings from Last 3 Encounters:  11/14/19 122/62  09/14/19 124/70  09/14/19 124/70   Kidney Function Lab Results  Component Value Date/Time   CREATININE 1.07 09/14/2019 11:33 AM   CREATININE 1.06 03/17/2019 11:32 AM   GFR 49.97 (L) 09/14/2019 11:33 AM   GFRNONAA 54 (L) 06/02/2018 10:15 AM   GFRAA 62 06/02/2018 10:15 AM   K 4.5 09/14/2019 11:33 AM   K 4.2 03/17/2019 11:32 AM   Patient checks BP at home 3-5x per week  Patient home BP readings  are ranging: 120s-130s/60s-70s  Patient has failed these meds in past: metoprolol Patient is currently controlled on the following medications:   diltiazem 180 mg daily  HCTZ 25 mg daily,   olmesartan 5 mg daily  We discussed: BP goals and benefits of medications for prevention of heart attack / stroke  Plan  Continue current medications and control with diet and exercise  AFIB   Patient is currently rate controlled.  Office heart rates are  Pulse Readings from Last 3 Encounters:  11/14/19 (!) 58  09/14/19  61  09/14/19 61   CHA2DS2-VASc Score = 5  The patient's score is based upon: CHF History: 0 HTN History: 1 Age : 2 Diabetes History: 1 Stroke History: 0 Vascular Disease History: 0 Gender: 1  Patient has failed these meds in past: n/a Patient is currently controlled on the following medications:   diltiazem 180 mg daily,   flecainide 100 mg BID  Eliquis 5 mg BID  We discussed:  Pt reports Afib started during hospitalization for Pneumonia in 2019. She does not feel when she goes into Afib. She feels current meds are working well for her.   Plan  Continue current medications   Diabetes   A1c goal < 7%  Recent Relevant Labs: Lab Results  Component Value Date/Time   HGBA1C 6.7 (H) 09/14/2019 11:33 AM   HGBA1C 6.4 03/17/2019 11:32 AM   MICROALBUR 0.2 11/01/2007 08:29 AM    No medication indicated.  We discussed: diet and exercise extensively and A1c goals.  Pt reports she is trying to eat better, discussed portion control and maximizing vegetables/protein over carbs. Her exercise consists of walking around the block and running errands.  Plan  Continue control with diet and exercise   Hyperlipidemia   LDL goal < 100  Lipid Panel     Component Value Date/Time   CHOL 172 09/14/2019 1133   CHOL 174 12/05/2012 1538   TRIG 108.0 09/14/2019 1133   TRIG 109 12/05/2012 1538   HDL 62.60 09/14/2019 1133   HDL 60 12/05/2012 1538   CHOLHDL 3 09/14/2019 1133   VLDL 21.6 09/14/2019 1133   LDLCALC 88 09/14/2019 1133   LDLCALC 92 12/05/2012 1538    The 10-year ASCVD risk score Mikey Bussing DC Jr., et al., 2013) is: 33.7%   Values used to calculate the score:     Age: 69 years     Sex: Female     Is Non-Hispanic African American: No     Diabetic: Yes     Tobacco smoker: No     Systolic Blood Pressure: 366 mmHg     Is BP treated: Yes     HDL Cholesterol: 62.6 mg/dL     Total Cholesterol: 172 mg/dL  Patient has failed these meds in past: n/a Patient is currently  controlled on the following medications:   atorvastatin 10 mg (1/2 of 20 mg) daily  We discussed:  diet and exercise extensively; Cholesterol goals; benefits of statin for ASCVD risk reduction  Plan  Continue current medications    Back pain   Patient has failed these meds in past: n/a Patient is currently controlled on the following medications:   acetaminophen 500 mg q8h PRN  We discussed:  Tylenol daily limits. Pt reports pain is generally controlled with 1-2 Tylenol.  Plan  Continue current medications   Health Maintenance   Patient is currently controlled on the following medications:   docusate 100-300 mg HS,   prn glycerin suppository  multivitamin  We discussed:  Bowel movements are usually well controlled with docusate, occasionally uses suppository.   Plan  Continue current medications    Medication Management   Pt uses [facility] for all medications Does not use pill box Pt endorses 100% compliance  We discussed: med regimen is working well for her. [facility] is not a preferred pharmacy with insurance so did not discuss.  Plan  Continue current med management strategy    Follow up: 6 month phone visit  Charlene Brooke, PharmD, [facility] Clinical Pharmacist [facility] [phone]

## 2020-01-30 DIAGNOSIS — Z0120 Encounter for dental examination and cleaning without abnormal findings: Secondary | ICD-10-CM | POA: Diagnosis not present

## 2020-02-01 ENCOUNTER — Other Ambulatory Visit: Payer: Self-pay

## 2020-02-01 ENCOUNTER — Ambulatory Visit (INDEPENDENT_AMBULATORY_CARE_PROVIDER_SITE_OTHER): Payer: Medicare Other

## 2020-02-01 DIAGNOSIS — Z23 Encounter for immunization: Secondary | ICD-10-CM

## 2020-02-16 ENCOUNTER — Ambulatory Visit: Payer: Medicare Other

## 2020-03-04 ENCOUNTER — Other Ambulatory Visit: Payer: Self-pay | Admitting: Cardiovascular Disease

## 2020-03-05 ENCOUNTER — Other Ambulatory Visit: Payer: Self-pay

## 2020-03-05 ENCOUNTER — Ambulatory Visit
Admission: RE | Admit: 2020-03-05 | Discharge: 2020-03-05 | Disposition: A | Discharge status: Home / Self Care | Payer: Medicare Other | Source: Ambulatory Visit | Attending: Internal Medicine | Admitting: Internal Medicine

## 2020-03-05 DIAGNOSIS — Z1231 Encounter for screening mammogram for malignant neoplasm of breast: Secondary | ICD-10-CM

## 2020-03-12 ENCOUNTER — Other Ambulatory Visit: Payer: Self-pay | Admitting: Internal Medicine

## 2020-03-12 DIAGNOSIS — R928 Other abnormal and inconclusive findings on diagnostic imaging of breast: Secondary | ICD-10-CM

## 2020-03-18 ENCOUNTER — Encounter: Payer: Medicare Other | Admitting: Internal Medicine

## 2020-03-25 ENCOUNTER — Other Ambulatory Visit: Payer: Self-pay

## 2020-03-25 ENCOUNTER — Ambulatory Visit
Admission: RE | Admit: 2020-03-25 | Discharge: 2020-03-25 | Disposition: A | Discharge status: Home / Self Care | Payer: Medicare Other | Source: Ambulatory Visit | Attending: Internal Medicine | Admitting: Internal Medicine

## 2020-03-25 DIAGNOSIS — R921 Mammographic calcification found on diagnostic imaging of breast: Secondary | ICD-10-CM | POA: Diagnosis not present

## 2020-03-25 DIAGNOSIS — R928 Other abnormal and inconclusive findings on diagnostic imaging of breast: Secondary | ICD-10-CM | POA: Diagnosis not present

## 2020-04-07 ENCOUNTER — Other Ambulatory Visit: Payer: Self-pay | Admitting: Cardiovascular Disease

## 2020-04-11 ENCOUNTER — Other Ambulatory Visit: Payer: Self-pay | Admitting: Cardiovascular Disease

## 2020-04-11 ENCOUNTER — Telehealth: Payer: Self-pay | Admitting: Pharmacist

## 2020-04-11 NOTE — Progress Notes (Addendum)
Chronic Care Management Pharmacy Assistant   Name: Sharon Knight  MRN: [id number] DOB: 11/30/1945  Reason for Encounter: Hypertension Adherence Call   PCP : Binnie Rail, MD  Allergies:   Allergies  Allergen Reactions   Ibuprofen Other (See Comments)    Dizziness "made me woozy"    Medications: Outpatient Encounter Medications as of 04/11/2020  Medication Sig   acetaminophen (TYLENOL) 650 MG CR tablet Take 650 mg by mouth every 8 (eight) hours as needed for pain.   atorvastatin (LIPITOR) 20 MG tablet TAKE (1/2) TABLET DAILY.   diltiazem (CARDIZEM CD) 180 MG 24 hr capsule TAKE (1) CAPSULE DAILY.   Docusate Sodium (STOOL SOFTENER LAXATIVE PO) Take 3-4 capsules by mouth daily.   ELIQUIS 5 MG TABS tablet TAKE 1 TABLET BY MOUTH TWICE DAILY.   flecainide (TAMBOCOR) 100 MG tablet Take 1 tablet (100 mg total) by mouth 2 (two) times daily. Please make overdue appt with Dr. Curt Bears before anymore refills. 1st attempt   hydrochlorothiazide (HYDRODIURIL) 25 MG tablet TAKE 1 TABLET EACH DAY.   Multiple Vitamin (MULTIVITAMIN) tablet Take 1 tablet by mouth daily.   olmesartan (BENICAR) 5 MG tablet TAKE 1 TABLET ONCE DAILY.   No facility-administered encounter medications on file as of 04/11/2020.    Current Diagnosis: Patient Active Problem List   Diagnosis Date Noted   Trigger finger, right ring finger 03/17/2019   CKD (chronic kidney disease) stage 3, GFR 30-59 ml/min (HCC) 03/16/2019   Diabetes mellitus without complication ([place]) 59/93/5701   Bilateral low back pain 03/17/2018   Gout 12/21/2017   Lobar pneumonia ([place]) 12/10/2017   Paroxysmal atrial fibrillation ([place]) 12/10/2017   History of colonic polyps 10/22/2014   History of nephrectomy, unilateral 11/13/2012   Vitamin D deficiency 10/31/2012   ECZEMA 03/03/2010   Benign neoplasm of colon 08/02/2007   Hyperlipidemia 08/02/2007   Essential hypertension 08/02/2007   Osteopenia 08/02/2007    Goals Addressed   None      Follow-Up:  Pharmacist Review   Reviewed chart prior to disease state call. Spoke with patient regarding BP  Recent Office Vitals: BP Readings from Last 3 Encounters:  11/14/19 122/62  09/14/19 124/70  09/14/19 124/70   Pulse Readings from Last 3 Encounters:  11/14/19 (!) 58  09/14/19 61  09/14/19 61    Wt Readings from Last 3 Encounters:  11/14/19 173 lb 6.4 oz (78.7 kg)  09/14/19 172 lb (78 kg)  09/14/19 172 lb 6.4 oz (78.2 kg)     Kidney Function Lab Results  Component Value Date/Time   CREATININE 1.07 09/14/2019 11:33 AM   CREATININE 1.06 03/17/2019 11:32 AM   GFR 49.97 (L) 09/14/2019 11:33 AM   GFRNONAA 54 (L) 06/02/2018 10:15 AM   GFRAA 62 06/02/2018 10:15 AM    BMP Latest Ref Rng & Units 09/14/2019 03/17/2019 09/15/2018  Glucose 70 - 99 mg/dL 97 89 86  BUN 6 - 23 mg/dL 18 17 18   Creatinine 0.40 - 1.20 mg/dL 1.07 1.06 1.06  BUN/Creat Ratio 12 - 28 - - -  Sodium 135 - 145 mEq/L 141 142 141  Potassium 3.5 - 5.1 mEq/L 4.5 4.2 4.3  Chloride 96 - 112 mEq/L 102 102 102  CO2 19 - 32 mEq/L 31 31 31   Calcium 8.4 - 10.5 mg/dL 10.0 10.1 9.8    Current antihypertensive regimen: The patient is taking Olmesartan, Diltiazem and Hydrochlotothiazide  How often are you checking your Blood Pressure? The patient states that she does not  take her blood pressure like she should.  Current home BP readings: The patient last reading at home back I Oct was 134/58  What recent interventions/DTPs have been made by any provider to improve Blood Pressure control since last CPP Visit: The patient sis to continue with diltiazem, HCTZ and olmesartan  Any recent hospitalizations or ED visits since last visit with CPP? The patient has not been to the hospital or ED recently  What diet changes have been made to improve Blood Pressure Control? The patient has not make any changes to her diet, she states that she know she needs to lose some weight but it's much easier to just to go out and  eat.  What exercise is being done to improve your Blood Pressure Control? The patient is trying to walk when she can. She would like to [facility].   Adherence Review: Is the patient currently on ACE/ARB medication? Yes, patient is taking olmesartan  Does the patient have >5 day gap between last estimated fill dates? No   Wendy Poet

## 2020-04-16 NOTE — Patient Instructions (Addendum)
Blood work was ordered.     No immunization administered today.    Medications changes include :   none    Please followup in 6 months     Health Maintenance, Female Adopting a healthy lifestyle and getting preventive care are important in promoting health and wellness. Ask your health care provider about:  The right schedule for you to have regular tests and exams.  Things you can do on your own to prevent diseases and keep yourself healthy. What should I know about diet, weight, and exercise? Eat a healthy diet   Eat a diet that includes plenty of vegetables, fruits, low-fat dairy products, and lean protein.  Do not eat a lot of foods that are high in solid fats, added sugars, or sodium. Maintain a healthy weight Body mass index (BMI) is used to identify weight problems. It estimates body fat based on height and weight. Your health care provider can help determine your BMI and help you achieve or maintain a healthy weight. Get regular exercise Get regular exercise. This is one of the most important things you can do for your health. Most adults should:  Exercise for at least 150 minutes each week. The exercise should increase your heart rate and make you sweat (moderate-intensity exercise).  Do strengthening exercises at least twice a week. This is in addition to the moderate-intensity exercise.  Spend less time sitting. Even light physical activity can be beneficial. Watch cholesterol and blood lipids Have your blood tested for lipids and cholesterol at 75 years of age, then have this test every 5 years. Have your cholesterol levels checked more often if:  Your lipid or cholesterol levels are high.  You are older than 75 years of age.  You are at high risk for heart disease. What should I know about cancer screening? Depending on your health history and family history, you may need to have cancer screening at various ages. This may include screening for:  Breast  cancer.  Cervical cancer.  Colorectal cancer.  Skin cancer.  Lung cancer. What should I know about heart disease, diabetes, and high blood pressure? Blood pressure and heart disease  High blood pressure causes heart disease and increases the risk of stroke. This is more likely to develop in people who have high blood pressure readings, are of African descent, or are overweight.  Have your blood pressure checked: ? Every 3-5 years if you are 91-53 years of age. ? Every year if you are 66 years old or older. Diabetes Have regular diabetes screenings. This checks your fasting blood sugar level. Have the screening done:  Once every three years after age 3 if you are at a normal weight and have a low risk for diabetes.  More often and at a younger age if you are overweight or have a high risk for diabetes. What should I know about preventing infection? Hepatitis B If you have a higher risk for hepatitis B, you should be screened for this virus. Talk with your health care provider to find out if you are at risk for hepatitis B infection. Hepatitis C Testing is recommended for:  Everyone born from [birth year] through 1966.  Anyone with known risk factors for hepatitis C. Sexually transmitted infections (STIs)  Get screened for STIs, including gonorrhea and chlamydia, if: ? You are sexually active and are younger than 75 years of age. ? You are older than 75 years of age and your health care provider tells you that you are  at risk for this type of infection. ? Your sexual activity has changed since you were last screened, and you are at increased risk for chlamydia or gonorrhea. Ask your health care provider if you are at risk.  Ask your health care provider about whether you are at high risk for HIV. Your health care provider may recommend a prescription medicine to help prevent HIV infection. If you choose to take medicine to prevent HIV, you should first get tested for HIV. You should  then be tested every 3 months for as long as you are taking the medicine. Pregnancy  If you are about to stop having your period (premenopausal) and you may become pregnant, seek counseling before you get pregnant.  Take 400 to 800 micrograms (mcg) of folic acid every day if you become pregnant.  Ask for birth control (contraception) if you want to prevent pregnancy. Osteoporosis and menopause Osteoporosis is a disease in which the bones lose minerals and strength with aging. This can result in bone fractures. If you are 51 years old or older, or if you are at risk for osteoporosis and fractures, ask your health care provider if you should:  Be screened for bone loss.  Take a calcium or vitamin D supplement to lower your risk of fractures.  Be given hormone replacement therapy (HRT) to treat symptoms of menopause. Follow these instructions at home: Lifestyle  Do not use any products that contain nicotine or tobacco, such as cigarettes, e-cigarettes, and chewing tobacco. If you need help quitting, ask your health care provider.  Do not use street drugs.  Do not share needles.  Ask your health care provider for help if you need support or information about quitting drugs. Alcohol use  Do not drink alcohol if: ? Your health care provider tells you not to drink. ? You are pregnant, may be pregnant, or are planning to become pregnant.  If you drink alcohol: ? Limit how much you use to 0-1 drink a day. ? Limit intake if you are breastfeeding.  Be aware of how much alcohol is in your drink. In the [place] one drink equals one 12 oz bottle of beer (355 mL), one 5 oz glass of wine (148 mL), or one 1 oz glass of hard liquor (44 mL). General instructions  Schedule regular health, dental, and eye exams.  Stay current with your vaccines.  Tell your health care provider if: ? You often feel depressed. ? You have ever been abused or do not feel safe at home. Summary  Adopting a  healthy lifestyle and getting preventive care are important in promoting health and wellness.  Follow your health care provider's instructions about healthy diet, exercising, and getting tested or screened for diseases.  Follow your health care provider's instructions on monitoring your cholesterol and blood pressure. This information is not intended to replace advice given to you by your health care provider. Make sure you discuss any questions you have with your health care provider. Document Revised: 04/13/2018 Document Reviewed: 04/13/2018 Elsevier Patient Education  2020 [employer].

## 2020-04-16 NOTE — Progress Notes (Signed)
Subjective:    Patient ID: Sharon Knight, female    DOB: 1945-11-02, 74 y.o.   MRN: [id number]   This visit occurred during the SARS-CoV-2 public health emergency.  Safety protocols were in place, including screening questions prior to the visit, additional usage of staff PPE, and extensive cleaning of exam room while observing appropriate contact time as indicated for disinfecting solutions.    HPI She is here for a physical exam.   Aches and pains and decreased stamina.    She worries about her memory.    Medications and allergies reviewed with patient and updated if appropriate.  Patient Active Problem List   Diagnosis Date Noted  . Trigger finger, right ring finger 03/17/2019  . CKD (chronic kidney disease) stage 3, GFR 30-59 ml/min (HCC) 03/16/2019  . Diabetes mellitus without complication ([place]) 17/61/6073  . Bilateral low back pain 03/17/2018  . Gout 12/21/2017  . Lobar pneumonia ([place]) 12/10/2017  . Paroxysmal atrial fibrillation ([place]) 12/10/2017  . History of colonic polyps 10/22/2014  . 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 Vitamin D deficiency 10/31/2012  . ECZEMA 03/03/2010  . 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 Hyperlipidemia 08/02/2007  . Essential hypertension 08/02/2007  . Osteopenia 08/02/2007    Current Outpatient Medications on File Prior to Visit  Medication Sig Dispense Refill  . acetaminophen (TYLENOL) 650 MG CR tablet Take 650 mg by mouth every 8 (eight) hours as needed for pain.    Marland Kitchen atorvastatin (LIPITOR) 20 MG tablet TAKE (1/2) TABLET DAILY. 45 tablet 8  . diltiazem (CARDIZEM CD) 180 MG 24 hr capsule TAKE (1) CAPSULE DAILY. 90 capsule 1  . Docusate Sodium (STOOL SOFTENER LAXATIVE PO) Take 3-4 capsules by mouth daily.    Marland Kitchen ELIQUIS 5 MG TABS tablet TAKE 1 TABLET BY MOUTH TWICE DAILY. 60 tablet 5  . flecainide (TAMBOCOR) 100 MG tablet TAKE 1 TABLET BY MOUTH TWICE DAILY. 180 tablet 2  . hydrochlorothiazide (HYDRODIURIL) 25 MG tablet  TAKE 1 TABLET EACH DAY. 90 tablet 1  . olmesartan (BENICAR) 5 MG tablet TAKE 1 TABLET ONCE DAILY. 90 tablet 2   No current facility-administered medications on file prior to visit.    Past Medical History:  Diagnosis Date  . Arthritis   . Bilateral cataracts    Dr Kathrin Penner  . Hyperlipidemia   . Hypertension     Past Surgical History:  Procedure Laterality Date  . COLONOSCOPY     Dr Fuller Plan  . 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 OVARIAN CYST REMOVAL      Social History   Socioeconomic History  . Marital status: Married    Spouse name: Not on file  . Number of children: Not on file  . Years of education: Not on file  . Highest education level: Not on file  Occupational History  . Not on file  Tobacco Use  . Smoking status: Former Smoker    Quit date: 08/27/1984    Years since quitting: 35.6  . Smokeless tobacco: Never Used  . Tobacco comment: smoked age [age], up to 1.5-2 ppd  Vaping Use  . Vaping Use: Never used  Substance and Sexual Activity  . Alcohol use: Yes    Alcohol/week: 3.0 standard drinks    Types: 3 Cans of beer per week    Comment: 3 x weekly  . Drug use: No  . Sexual activity: Not on file  Other Topics Concern  . Not on  file  Social History Narrative   Exercise: none   Social Determinants of Health   Financial Resource Strain: Not on file  Food Insecurity: Not on file  Transportation Needs: Not on file  Physical Activity: Not on file  Stress: Not on file  Social Connections: Not on file    Family History  Adopted: Yes  Problem Relation Age of Onset  . Colon cancer Neg Hx   . Stomach cancer Neg Hx   . Breast cancer Neg Hx     Review of Systems  Constitutional: Negative for chills and fever.  Eyes: Negative for visual disturbance.  Respiratory: Negative for cough, shortness of breath and wheezing.   Cardiovascular: Positive for palpitations (rare). Negative for chest pain and leg swelling.  Gastrointestinal: Negative for  abdominal pain, blood in stool, constipation, diarrhea and nausea.       Rare gerd  Genitourinary: Negative for dysuria.  Musculoskeletal: Positive for arthralgias and back pain.  Skin: Negative for rash.  Neurological: Negative for dizziness and headaches.  Psychiatric/Behavioral: Negative for dysphoric mood. The patient is not nervous/anxious.        Objective:   Vitals:   04/17/20 1146  BP: 120/80  Pulse: 63  Temp: 98.2 F (36.8 C)  SpO2: 98%   Filed Weights   04/17/20 1146  Weight: 173 lb 3.2 oz (78.6 kg)   Body mass index is 28.82 kg/m.  BP Readings from Last 3 Encounters:  04/17/20 120/80  11/14/19 122/62  09/14/19 124/70    Wt Readings from Last 3 Encounters:  04/17/20 173 lb 3.2 oz (78.6 kg)  11/14/19 173 lb 6.4 oz (78.7 kg)  09/14/19 172 lb (78 kg)     Physical Exam Constitutional: She appears well-developed and well-nourished. No distress.  HENT:  Head: Normocephalic and atraumatic.  Right Ear: External ear normal. Normal ear canal and TM Left Ear: External ear normal.  Normal ear canal and TM Mouth/Throat: Oropharynx is clear and moist.  Eyes: Conjunctivae and EOM are normal.  Neck: Neck supple. No tracheal deviation present. No thyromegaly present.  No carotid bruit  Cardiovascular: Normal rate, regular rhythm and normal heart sounds.   No murmur heard.  No edema. Pulmonary/Chest: Effort normal and breath sounds normal. No respiratory distress. She has no wheezes. She has no rales.  Breast: deferred   Abdominal: Soft. She exhibits no distension. There is no tenderness.  Lymphadenopathy: She has no cervical adenopathy.  Skin: Skin is warm and dry. She is not diaphoretic.  Psychiatric: She has a normal mood and affect. Her behavior is normal.        Assessment & Plan:   Physical exam: Screening blood work    ordered Immunizations  tdap due, had first shingrix Colonoscopy  Up to date  Mammogram  Up to date  Gyn  Up to date  Dexa  Due    elam - b/l hip osteopenia -- deferred Eye exams  Up to date  Exercise  Chair yoga once a week Weight  Ok for age - trying to lose weight Substance abuse  none      See Problem List for Assessment and Plan of chronic medical problems.

## 2020-04-17 ENCOUNTER — Encounter: Payer: Self-pay | Admitting: Internal Medicine

## 2020-04-17 ENCOUNTER — Other Ambulatory Visit: Payer: Self-pay

## 2020-04-17 ENCOUNTER — Ambulatory Visit (INDEPENDENT_AMBULATORY_CARE_PROVIDER_SITE_OTHER): Payer: Medicare Other | Admitting: Internal Medicine

## 2020-04-17 VITALS — BP 120/80 | HR 63 | Temp 98.2°F | Ht 65.0 in | Wt 173.2 lb

## 2020-04-17 DIAGNOSIS — E7849 Other hyperlipidemia: Secondary | ICD-10-CM

## 2020-04-17 DIAGNOSIS — E559 Vitamin D deficiency, unspecified: Secondary | ICD-10-CM | POA: Diagnosis not present

## 2020-04-17 DIAGNOSIS — M85852 Other specified disorders of bone density and structure, left thigh: Secondary | ICD-10-CM

## 2020-04-17 DIAGNOSIS — I480 Paroxysmal atrial fibrillation: Secondary | ICD-10-CM

## 2020-04-17 DIAGNOSIS — Z0000 Encounter for general adult medical examination without abnormal findings: Secondary | ICD-10-CM

## 2020-04-17 DIAGNOSIS — I10 Essential (primary) hypertension: Secondary | ICD-10-CM

## 2020-04-17 DIAGNOSIS — N1831 Chronic kidney disease, stage 3a: Secondary | ICD-10-CM

## 2020-04-17 DIAGNOSIS — M85851 Other specified disorders of bone density and structure, right thigh: Secondary | ICD-10-CM

## 2020-04-17 DIAGNOSIS — E119 Type 2 diabetes mellitus without complications: Secondary | ICD-10-CM | POA: Diagnosis not present

## 2020-04-17 LAB — COMPREHENSIVE METABOLIC PANEL
ALT: 21 U/L (ref 0–35)
AST: 22 U/L (ref 0–37)
Albumin: 4.5 g/dL (ref 3.5–5.2)
Alkaline Phosphatase: 86 U/L (ref 39–117)
BUN: 19 mg/dL (ref 6–23)
CO2: 30 mEq/L (ref 19–32)
Calcium: 10 mg/dL (ref 8.4–10.5)
Chloride: 102 mEq/L (ref 96–112)
Creatinine, Ser: 1.05 mg/dL (ref 0.40–1.20)
GFR: 51.88 mL/min — ABNORMAL LOW (ref >60.00)
Glucose, Bld: 95 mg/dL (ref 70–99)
Potassium: 4 mEq/L (ref 3.5–5.1)
Sodium: 140 mEq/L (ref 135–145)
Total Bilirubin: 0.6 mg/dL (ref 0.2–1.2)
Total Protein: 7.9 g/dL (ref 6.0–8.3)

## 2020-04-17 LAB — CBC WITH DIFFERENTIAL/PLATELET
Basophils Absolute: 0 10*3/uL (ref 0.0–0.1)
Basophils Relative: 0.3 % (ref 0.0–3.0)
Eosinophils Absolute: 0.3 10*3/uL (ref 0.0–0.7)
Eosinophils Relative: 2.9 % (ref 0.0–5.0)
HCT: 43.6 % (ref 36.0–46.0)
Hemoglobin: 14.5 g/dL (ref 12.0–15.0)
Lymphocytes Relative: 18.2 % (ref 12.0–46.0)
Lymphs Abs: 1.6 10*3/uL (ref 0.7–4.0)
MCHC: 33.3 g/dL (ref 30.0–36.0)
MCV: 91.9 fl (ref 78.0–100.0)
Monocytes Absolute: 0.8 10*3/uL (ref 0.1–1.0)
Monocytes Relative: 9.3 % (ref 3.0–12.0)
Neutro Abs: 6.2 10*3/uL (ref 1.4–7.7)
Neutrophils Relative %: 69.3 % (ref 43.0–77.0)
Platelets: 337 10*3/uL (ref 150.0–400.0)
RBC: 4.74 Mil/uL (ref 3.87–5.11)
RDW: 12.9 % (ref 11.5–15.5)
WBC: 9 10*3/uL (ref 4.0–10.5)

## 2020-04-17 LAB — LIPID PANEL
Cholesterol: 176 mg/dL (ref 0–200)
HDL: 70.2 mg/dL (ref >39.00)
LDL Cholesterol: 85 mg/dL (ref 0–99)
NonHDL: 106.02
Total CHOL/HDL Ratio: 3
Triglycerides: 104 mg/dL (ref 0.0–149.0)
VLDL: 20.8 mg/dL (ref 0.0–40.0)

## 2020-04-17 LAB — HEMOGLOBIN A1C: Hgb A1c MFr Bld: 6.5 % (ref 4.6–6.5)

## 2020-04-17 LAB — TSH: TSH: 2.62 u[IU]/mL (ref 0.35–4.50)

## 2020-04-17 LAB — VITAMIN D 25 HYDROXY (VIT D DEFICIENCY, FRACTURES): VITD: 34.51 ng/mL (ref 30.00–100.00)

## 2020-04-17 NOTE — Assessment & Plan Note (Signed)
Chronic BP well controlled Continue diltiazem 180 mg qd, hctz 25 mg qd, benicar 5 mg qd cmp

## 2020-04-17 NOTE — Assessment & Plan Note (Signed)
Chronic Diet controlled Check a1c Low sugar / carb diet Stressed regular exercise Work on weight loss

## 2020-04-17 NOTE — Assessment & Plan Note (Signed)
Chronic Continue eliquis 5 mg BID, diltiazem 180 mg daily, flecainide 100 mg BID Cbc, cmp, tsh

## 2020-04-17 NOTE — Addendum Note (Signed)
Addended by: Trenda Moots on: 67/04/4579 12:23 PM   Modules accepted: Orders

## 2020-04-17 NOTE — Assessment & Plan Note (Signed)
Chronic cmp 

## 2020-04-17 NOTE — Assessment & Plan Note (Signed)
Chronic Check lipid panel  Continue atorvastatin 10 mg daily Regular exercise and healthy diet encouraged  

## 2020-04-17 NOTE — Assessment & Plan Note (Signed)
Chronic Taking vitamin D daily Check vitamin D level  

## 2020-04-17 NOTE — Assessment & Plan Note (Addendum)
Chronic dexa due  - deferred Not exercising much Taking vitamin d daily Ck vitamin d daily

## 2020-04-18 ENCOUNTER — Telehealth: Payer: Self-pay | Admitting: Internal Medicine

## 2020-04-18 NOTE — Telephone Encounter (Signed)
Patient Requests lab work be sent in the mail to  [place]  [place] [address]  She is not always home, and will call us if she has questions

## 2020-04-19 NOTE — Telephone Encounter (Signed)
Mailed out to patient today.

## 2020-04-30 ENCOUNTER — Other Ambulatory Visit: Payer: Self-pay | Admitting: General Practice

## 2020-05-02 ENCOUNTER — Telehealth: Payer: Self-pay | Admitting: Pharmacist

## 2020-05-02 NOTE — Progress Notes (Signed)
    Chronic Care Management Pharmacy Assistant   Name: Sharon Knight  MRN: [id number] DOB: 18-May-1945  Reason for Encounter: Chart Review   PCP : Pincus Sanes, MD  Allergies:   Allergies  Allergen Reactions  . Ibuprofen Other (See Comments)    Dizziness "made me woozy"    Medications: Outpatient Encounter Medications as of 05/02/2020  Medication Sig  . acetaminophen (TYLENOL) 650 MG CR tablet Take 650 mg by mouth every 8 (eight) hours as needed for pain.  Marland Kitchen atorvastatin (LIPITOR) 20 MG tablet TAKE (1/2) TABLET DAILY.  Marland Kitchen diltiazem (CARDIZEM CD) 180 MG 24 hr capsule TAKE (1) CAPSULE DAILY.  Marland Kitchen Docusate Sodium (STOOL SOFTENER LAXATIVE PO) Take 3-4 capsules by mouth daily.  Marland Kitchen ELIQUIS 5 MG TABS tablet TAKE 1 TABLET BY MOUTH TWICE DAILY.  . flecainide (TAMBOCOR) 100 MG tablet TAKE 1 TABLET BY MOUTH TWICE DAILY.  . hydrochlorothiazide (HYDRODIURIL) 25 MG tablet TAKE 1 TABLET EACH DAY.  Marland Kitchen olmesartan (BENICAR) 5 MG tablet TAKE 1 TABLET ONCE DAILY.   No facility-administered encounter medications on file as of 05/02/2020.    Current Diagnosis: Patient Active Problem List   Diagnosis Date Noted  . Trigger finger, right ring finger 03/17/2019  . CKD (chronic kidney disease) stage 3, GFR 30-59 ml/min (HCC) 03/16/2019  . Diabetes mellitus without complication (HCC) 09/15/2018  . Bilateral low back pain 03/17/2018  . Gout 12/21/2017  . Lobar pneumonia (HCC) 12/10/2017  . Paroxysmal atrial fibrillation (HCC) 12/10/2017  . History of colonic polyps 10/22/2014  . 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 Vitamin D deficiency 10/31/2012  . ECZEMA 03/03/2010  . 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 Hyperlipidemia 08/02/2007  . Essential hypertension 08/02/2007  . Osteopenia 08/02/2007    Goals Addressed   None     Follow-Up:  Pharmacist Review   Reviewed chart for medication changes and adherence.   No gaps in adherence identified. Patient has follow up scheduled with  pharmacy team. No further action required.   Horald Pollen, Clinical Pharmacist Assistant [facility]

## 2020-05-05 ENCOUNTER — Other Ambulatory Visit: Payer: Self-pay | Admitting: Internal Medicine

## 2020-06-04 ENCOUNTER — Other Ambulatory Visit: Payer: Self-pay | Admitting: Cardiovascular Disease

## 2020-06-04 ENCOUNTER — Other Ambulatory Visit: Payer: Self-pay | Admitting: Internal Medicine

## 2020-07-02 ENCOUNTER — Other Ambulatory Visit: Payer: Self-pay | Admitting: Internal Medicine

## 2020-07-18 ENCOUNTER — Telehealth: Payer: Medicare Other

## 2020-07-26 ENCOUNTER — Other Ambulatory Visit: Payer: Self-pay | Admitting: General Practice

## 2020-08-01 ENCOUNTER — Other Ambulatory Visit: Payer: Self-pay | Admitting: Internal Medicine

## 2020-08-12 ENCOUNTER — Other Ambulatory Visit: Payer: Self-pay | Admitting: Internal Medicine

## 2020-08-12 DIAGNOSIS — R921 Mammographic calcification found on diagnostic imaging of breast: Secondary | ICD-10-CM

## 2020-08-27 ENCOUNTER — Telehealth: Payer: Self-pay

## 2020-08-27 NOTE — Progress Notes (Addendum)
    Chronic Care Management Pharmacy Assistant   Name: Sharon Knight  MRN: [id number] DOB: [birth year]  Reason for Encounter: Disease State-General Adherence   Recent office visits:  04/17/20 [name] (PCP) - Preventive Visit. No med changes. F/u in 6 months.  Recent consult visits:  None noted  Hospital visits:  None in previous 6 months  Medications: Outpatient Encounter Medications as of 08/27/2020  Medication Sig   hydrochlorothiazide (HYDRODIURIL) 25 MG tablet TAKE 1 TABLET EACH DAY.   acetaminophen (TYLENOL) 650 MG CR tablet Take 650 mg by mouth every 8 (eight) hours as needed for pain.   atorvastatin (LIPITOR) 20 MG tablet TAKE (1/2) TABLET DAILY.   diltiazem (CARDIZEM CD) 180 MG 24 hr capsule TAKE (1) CAPSULE DAILY.   Docusate Sodium (STOOL SOFTENER LAXATIVE PO) Take 3-4 capsules by mouth daily.   ELIQUIS 5 MG TABS tablet TAKE ONE TABLET BY MOUTH TWICE DAILY   flecainide (TAMBOCOR) 100 MG tablet TAKE 1 TABLET BY MOUTH TWICE DAILY.   olmesartan (BENICAR) 5 MG tablet TAKE 1 TABLET ONCE DAILY.   No facility-administered encounter medications on file as of 08/27/2020.    Have you had any problems recently with your health? Patient states that she has been getting tire lately and having to take naps.  Have you had any problems with your pharmacy? Patient has no problem getting medication.   What issues or side effects are you having with your medications? Patient has no issues at this time.   What would you like me to pass along to [facility] for them to help you with?  Patient is asking about getting 2nd Covid booster.  What can we do to take care of you better? Patient states not at this time.  Star Rating Drugs: Atorvastatin - last fill 06/13/20 90D Olmesartan - last fill 07/07/20 [place], [place] Clinical Pharmacists Assistant [phone]

## 2020-08-31 ENCOUNTER — Other Ambulatory Visit: Payer: Self-pay | Admitting: Internal Medicine

## 2020-09-05 ENCOUNTER — Ambulatory Visit (INDEPENDENT_AMBULATORY_CARE_PROVIDER_SITE_OTHER): Payer: Medicare Other | Admitting: Pharmacist

## 2020-09-05 ENCOUNTER — Other Ambulatory Visit: Payer: Self-pay

## 2020-09-05 DIAGNOSIS — N1831 Chronic kidney disease, stage 3a: Secondary | ICD-10-CM

## 2020-09-05 DIAGNOSIS — I10 Essential (primary) hypertension: Secondary | ICD-10-CM | POA: Diagnosis not present

## 2020-09-05 DIAGNOSIS — Z0000 Encounter for general adult medical examination without abnormal findings: Secondary | ICD-10-CM

## 2020-09-05 DIAGNOSIS — E7849 Other hyperlipidemia: Secondary | ICD-10-CM | POA: Diagnosis not present

## 2020-09-05 DIAGNOSIS — I480 Paroxysmal atrial fibrillation: Secondary | ICD-10-CM | POA: Diagnosis not present

## 2020-09-05 NOTE — Progress Notes (Signed)
Chronic Care Management Pharmacy Note  09/05/2020 Name:  Sharon Knight MRN:  [id number] DOB:  [birth year]  Subjective: Sharon Knight is an 75 y.o. year old female who is a primary patient of Burns, Claudina Lick, MD.  The CCM team was consulted for assistance with disease management and care coordination needs.    Engaged with patient by telephone for follow up visit in response to provider referral for pharmacy case management and/or care coordination services.   Consent to Services:  The patient was given information about Chronic Care Management services, agreed to services, and gave verbal consent prior to initiation of services.  Please see initial visit note for detailed documentation.   Patient Care Team: Binnie Rail, MD as PCP - General (Internal Medicine) Skeet Latch, MD as PCP - Cardiology (Cardiology) Constance Haw, MD as PCP - Electrophysiology (Cardiology) Charlton Haws, [facility] as Pharmacist (Pharmacist)  Recent office visits: 04/17/20 Dr [name] OV:   Recent consult visits: [facility] visits: None in previous 6 months  Objective:  Lab Results  Component Value Date   CREATININE 1.05 04/17/2020   BUN 19 04/17/2020   GFR 51.88 (L) 04/17/2020   GFRNONAA 54 (L) 06/02/2018   GFRAA 62 06/02/2018   NA 140 04/17/2020   K 4.0 04/17/2020   CALCIUM 10.0 04/17/2020   CO2 30 04/17/2020   GLUCOSE 95 04/17/2020    Lab Results  Component Value Date/Time   HGBA1C 6.5 04/17/2020 12:23 PM   HGBA1C 6.7 (H) 09/14/2019 11:33 AM   GFR 51.88 (L) 04/17/2020 12:23 PM   GFR 49.97 (L) 09/14/2019 11:33 AM   MICROALBUR 0.2 11/01/2007 08:29 AM    Last diabetic Eye exam:  Lab Results  Component Value Date/Time   HMDIABEYEEXA No Retinopathy 12/04/2019 12:00 AM    Last diabetic Foot exam: No results found for: HMDIABFOOTEX   Lab Results  Component Value Date   CHOL 176 04/17/2020   HDL 70.20 04/17/2020   LDLCALC 85 04/17/2020   TRIG 104.0 04/17/2020   CHOLHDL  3 04/17/2020    Hepatic Function Latest Ref Rng & Units 04/17/2020 09/14/2019 03/17/2019  Total Protein 6.0 - 8.3 g/dL 7.9 7.7 7.7  Albumin 3.5 - 5.2 g/dL 4.5 4.6 4.6  AST 0 - 37 U/L _0 ALT 0 - 35 U/L _1 Alk Phosphatase 39 - 117 U/L 86 90 88  Total Bilirubin 0.2 - 1.2 mg/dL 0.6 0.5 0.6  Bilirubin, Direct 0.0 - 0.3 mg/dL - - -    Lab Results  Component Value Date/Time   TSH 2.62 04/17/2020 12:23 PM   TSH 3.63 03/17/2019 11:32 AM    CBC Latest Ref Rng & Units 04/17/2020 09/14/2019 03/17/2019  WBC 4.0 - 10.5 K/uL 9.0 8.7 8.5  Hemoglobin 12.0 - 15.0 g/dL 14.5 14.4 14.4  Hematocrit 36.0 - 46.0 % 43.6 42.5 41.9  Platelets 150.0 - 400.0 K/uL 337.0 320.0 288.0    Lab Results  Component Value Date/Time   VD25OH 34.51 04/17/2020 12:23 PM   VD25OH 53.46 10/22/2014 10:21 AM    Clinical ASCVD: No  The 10-year ASCVD risk score Mikey Bussing DC Jr., et al., 2013) is: 36.5%   Values used to calculate the score:     Age: 34 years     Sex: Female     Is Non-Hispanic African American: No     Diabetic: Yes     Tobacco smoker: No     Systolic Blood Pressure: 768 mmHg  Is BP treated: Yes     HDL Cholesterol: 70.2 mg/dL     Total Cholesterol: 176 mg/dL    Depression screen [facility] 2/9 09/14/2019 03/17/2019 09/15/2018  Decreased Interest 0 0 0  Down, Depressed, Hopeless 1 0 0  PHQ - 2 Score 1 0 0  Altered sleeping 1 - -  Tired, decreased energy 0 - -  Change in appetite 0 - -  Feeling bad or failure about yourself  0 - -  Trouble concentrating 0 - -  Moving slowly or fidgety/restless 0 - -  Suicidal thoughts 0 - -  PHQ-9 Score 2 - -  Difficult doing work/chores Somewhat difficult - -     CHA2DS2-VASc Score = 5  The patient's score is based upon: CHF History: No HTN History: Yes Diabetes History: Yes Stroke History: No Vascular Disease History: No      Social History   Tobacco Use  Smoking Status Former Smoker  . Quit date: 08/27/1984  . Years since quitting: 36.0   Smokeless Tobacco Never Used  Tobacco Comment   smoked age [age], up to 1.5-2 ppd   BP Readings from Last 3 Encounters:  04/17/20 120/80  11/14/19 122/62  09/14/19 124/70   Pulse Readings from Last 3 Encounters:  04/17/20 63  11/14/19 (!) 58  09/14/19 61   Wt Readings from Last 3 Encounters:  04/17/20 173 lb 3.2 oz (78.6 kg)  11/14/19 173 lb 6.4 oz (78.7 kg)  09/14/19 172 lb (78 kg)   BMI Readings from Last 3 Encounters:  04/17/20 28.82 kg/m  11/14/19 28.86 kg/m  09/14/19 27.76 kg/m    Assessment/Interventions: Review of patient past medical history, allergies, medications, health status, including review of consultants reports, laboratory and other test data, was performed as part of comprehensive evaluation and provision of chronic care management services.   SDOH:  (Social Determinants of Health) assessments and interventions performed: Yes  SDOH Screenings   Alcohol Screen: Not on file  Depression (PHQ2-9): Low Risk   . PHQ-2 Score: 2  Financial Resource Strain: Not on file  Food Insecurity: Not on file  Housing: Not on file  Physical Activity: Not on file  Social Connections: Not on file  Stress: Not on file  Tobacco Use: Medium Risk  . Smoking Tobacco Use: Former Smoker  . Smokeless Tobacco Use: Never Used  Transportation Needs: Not on file    CCM Care Plan  Allergies  Allergen Reactions  . Ibuprofen Other (See Comments)    Dizziness "made me woozy"    Medications Reviewed Today    Reviewed by Charlton Haws, [facility] (Pharmacist) on 09/05/20 at 1526  Med List Status: <None>  Medication Order Taking? Sig Documenting Provider Last Dose Status Informant  acetaminophen (TYLENOL) 650 MG CR tablet [id number] Yes Take 650 mg by mouth every 8 (eight) hours as needed for pain. [provider] Taking Active   atorvastatin (LIPITOR) 20 MG tablet [id number] Yes TAKE (1/2) TABLET DAILY. Skeet Latch, MD Taking Active   diltiazem [facility]) 180  MG 24 hr capsule [id number] Yes TAKE (1) CAPSULE DAILY. Skeet Latch, MD Taking Active   Docusate Sodium (STOOL SOFTENER LAXATIVE PO) [id number] Yes Take 3-4 capsules by mouth daily. [provider] Taking Active   ELIQUIS 5 MG TABS tablet [id number] Yes TAKE ONE TABLET BY MOUTH TWICE DAILY Burns, Claudina Lick, MD Taking Active   flecainide (TAMBOCOR) 100 MG tablet [id number] Yes TAKE 1 TABLET BY MOUTH TWICE DAILY. Skeet Latch, MD Taking Active  hydrochlorothiazide (HYDRODIURIL) 25 MG tablet [id number] Yes TAKE 1 TABLET EACH DAY. Skeet Latch, MD Taking Active   olmesartan [facility]) 5 MG tablet [id number] Yes TAKE 1 TABLET ONCE DAILY. Skeet Latch, MD Taking Active           Patient Active Problem List   Diagnosis Date Noted  . Trigger finger, right ring finger 03/17/2019  . CKD (chronic kidney disease) stage 3, GFR 30-59 ml/min (HCC) 03/16/2019  . Diabetes mellitus without complication ([place]) 38/25/0539  . Bilateral low back pain 03/17/2018  . Gout 12/21/2017  . Lobar pneumonia ([place]) 12/10/2017  . Paroxysmal atrial fibrillation ([place]) 12/10/2017  . History of colonic polyps 10/22/2014  . 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 Vitamin D deficiency 10/31/2012  . ECZEMA 03/03/2010  . 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 Hyperlipidemia 08/02/2007  . Essential hypertension 08/02/2007  . Osteopenia 08/02/2007    Immunization History  Administered Date(s) Administered  . Fluad Quad(high Dose 65+) 01/21/2019, 02/01/2020  . Influenza Split 02/18/2011, 02/18/2012  . Influenza Whole 03/02/2007, 02/03/2008, 02/08/2009, 02/25/2010  . Influenza, High Dose Seasonal PF 02/14/2013, 02/05/2016, 02/03/2017, 02/15/2018  . Influenza,inj,Quad PF,6+ Mos 02/20/2014, 02/11/2015  . Influenza-Unspecified 02/02/2016  . PFIZER(Purple Top)SARS-COV-2 Vaccination 07/15/2019, 08/09/2019, 04/04/2020  . Pneumococcal Conjugate-13 02/12/2015  . Pneumococcal Polysaccharide-23  02/19/2017  . Td 03/03/2010  . Zoster 11/02/2012  . Zoster Recombinat (Shingrix) 02/28/2020    Conditions to be addressed/monitored:  Hypertension, Hyperlipidemia, Diabetes and Atrial Fibrillation  Care Plan : CCM Pharmacy Care Plan  Updates made by Charlton Haws, [place] since 09/05/2020 12:00 AM    Problem: Hypertension, Hyperlipidemia, Diabetes and Atrial Fibrillation   Priority: High    Long-Range Goal: Disease management   Start Date: 09/05/2020  Expected End Date: 09/05/2021  This Visit's Progress: On track  Priority: High  Note:   Current Barriers:  . Unable to independently monitor therapeutic efficacy  Pharmacist Clinical Goal(s):  Marland Kitchen Patient will achieve adherence to monitoring guidelines and medication adherence to achieve therapeutic efficacy through collaboration with PharmD and provider.   Interventions: . 1:1 collaboration with Binnie Rail, MD regarding development and update of comprehensive plan of care as evidenced by provider attestation and co-signature . Inter-disciplinary care team collaboration (see longitudinal plan of care) . Comprehensive medication review performed; medication list updated in electronic medical record  Hypertension / CKD stage 3a    BP goal < 130/80 Patient checks BP at home 3-5x per week Patient home BP readings are ranging: 120s-130s/60s-70s   Patient has failed these meds in past: metoprolol Patient is currently controlled on the following medications:   diltiazem 180 mg daily  HCTZ 25 mg daily,   olmesartan 5 mg daily   We discussed: BP goals and benefits of medications for prevention of heart attack / stroke   Plan: Continue current medications and control with diet and exercise   AFIB    Patient is currently rate controlled. CHADSVASC=5   Patient has failed these meds in past: n/a Patient is currently controlled on the following medications:   diltiazem 180 mg daily,   flecainide 100 mg BID  Eliquis 5 mg  BID   We discussed:  Pt reports Afib started during hospitalization for Pneumonia in 2019. She does not feel when she goes into Afib. She feels current meds are working well for her.    Plan: Continue current medications   Diabetes    A1c goal < 7%   No medication indicated.   We discussed:  diet and exercise extensively and A1c goals.  Pt reports she is trying to eat better, discussed portion control and maximizing vegetables/protein over carbs. Her exercise consists of walking around the block and running errands.   Plan: Continue control with diet and exercise    Hyperlipidemia    LDL goal < 100 Patient has failed these meds in past: n/a Patient is currently controlled on the following medications:   atorvastatin 10 mg (1/2 of 20 mg) daily   We discussed:  diet and exercise extensively; Cholesterol goals; benefits of statin for ASCVD risk reduction   Plan: Continue current medications    Vaccines   Reviewed and discussed patient's vaccination history. Pt asked about covid booster dose #2 - she has received 3 doses total, last 04/04/20.  -counseled on benefits/risks of covid vaccine dose #4. Pt is eligible for 4th dose given age > 64 and >4 months since last dose.  Plan Recmmended patient receive covid vaccine dose #4 at pharmacy    Patient Goals/Self-Care Activities . Patient will:  - take medications as prescribed focus on medication adherence by pill box check blood pressure daily, document, and provide at future appointments  -may get covid vaccine dose #4 at pharmacy  Follow Up Plan: Telephone follow up appointment with care management team member scheduled for: 6 months      Medication Assistance: None required.  Patient affirms current coverage meets needs.  Patient's preferred pharmacy is:  [facility], [place] [address] Phone: [phone] Fax:  [phone]  Uses pill box? Yes Pt endorses 100% compliance  We discussed: Current pharmacy is preferred with insurance plan and patient is satisfied with pharmacy services Patient decided to: Continue current medication management strategy  Care Plan and Follow Up Patient Decision:  Patient agrees to Care Plan and Follow-up.  Plan: Telephone follow up appointment with care management team member scheduled for:  6 months  Charlene Brooke, PharmD, [place], CPP Clinical Pharmacist [facility] at [facility] [phone]

## 2020-09-05 NOTE — Patient Instructions (Signed)
Visit Information  Phone number for Pharmacist: [phone]  Goals Addressed            This Visit's Progress   . Manage My Medicine       Timeframe:  Long-Range Goal Priority:  Medium Start Date:   09/05/20                          Expected End Date:   09/05/21                    Follow Up Date 04/02/21   - call for medicine refill 2 or 3 days before it runs out - call if I am sick and can't take my medicine - keep a list of all the medicines I take; vitamins and herbals too - use a pillbox to sort medicine  -may get covid vaccine dose #4 at pharmacy   Why is this important?   . These steps will help you keep on track with your medicines.   Notes:       The patient verbalized understanding of instructions, educational materials, and care plan provided today and declined offer to receive copy of patient instructions, educational materials, and care plan.  Telephone follow up appointment with pharmacy team member scheduled for: 6 months  Charlene Brooke, PharmD, [place], CPP Clinical Pharmacist [facility] at [facility] [phone]

## 2020-09-16 ENCOUNTER — Ambulatory Visit (INDEPENDENT_AMBULATORY_CARE_PROVIDER_SITE_OTHER): Payer: Medicare Other

## 2020-09-16 ENCOUNTER — Other Ambulatory Visit: Payer: Self-pay

## 2020-09-16 ENCOUNTER — Encounter: Payer: Self-pay | Admitting: Internal Medicine

## 2020-09-16 ENCOUNTER — Ambulatory Visit: Payer: Medicare Other | Admitting: Family Medicine

## 2020-09-16 ENCOUNTER — Ambulatory Visit (INDEPENDENT_AMBULATORY_CARE_PROVIDER_SITE_OTHER): Payer: Medicare Other | Admitting: Internal Medicine

## 2020-09-16 ENCOUNTER — Encounter: Payer: Self-pay | Admitting: Family Medicine

## 2020-09-16 ENCOUNTER — Ambulatory Visit: Payer: Self-pay

## 2020-09-16 VITALS — BP 110/78 | HR 68 | Temp 99.1°F | Ht 65.0 in

## 2020-09-16 VITALS — BP 110/78 | HR 68 | Ht 65.0 in | Wt 173.0 lb

## 2020-09-16 DIAGNOSIS — M25562 Pain in left knee: Secondary | ICD-10-CM | POA: Insufficient documentation

## 2020-09-16 DIAGNOSIS — M1712 Unilateral primary osteoarthritis, left knee: Secondary | ICD-10-CM | POA: Diagnosis not present

## 2020-09-16 NOTE — Patient Instructions (Signed)
Thank you for coming in today.  Please use Voltaren gel (Generic Diclofenac Gel) up to 4x daily for pain as needed.  This is available over-the-counter as both the name brand Voltaren gel and the generic diclofenac gel.  Please get an Xray today before you leave  You had a steroid injection today in your knee. Call or go to the ER if you develop a large red swollen joint with extreme pain or oozing puss.   Recheck back in 4 weeks.

## 2020-09-16 NOTE — Progress Notes (Signed)
Subjective:    Patient ID: Sharon Knight, female    DOB: [birth year], 75 y.o.   MRN: [id number]  HPI The patient is here for an acute visit.   Left knee pain:  It started last Sunday.  It bothered her when she got up that morning and continued to get worse.  She states the pain was mostly on the lateral aspect.  She overdid it on Thursday and it got much worse.   She can not bend her knee.  She does not think it is swollen.    She was using a cane, but had to start using the walker.    She can not extend or flex the knee.   Left knee x-ray done 07/05/2003: Considerable degenerative change at the patella femoral articulation    Medications and allergies reviewed with patient and updated if appropriate.  Patient Active Problem List   Diagnosis Date Noted  . Trigger finger, right ring finger 03/17/2019  . CKD (chronic kidney disease) stage 3, GFR 30-59 ml/min (HCC) 03/16/2019  . Diabetes mellitus without complication ([place]) 02/54/2706  . Bilateral low back pain 03/17/2018  . Gout 12/21/2017  . Lobar pneumonia ([place]) 12/10/2017  . Paroxysmal atrial fibrillation ([place]) 12/10/2017  . History of colonic polyps 10/22/2014  . 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 Vitamin D deficiency 10/31/2012  . ECZEMA 03/03/2010  . 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 Hyperlipidemia 08/02/2007  . Essential hypertension 08/02/2007  . Osteopenia 08/02/2007    Current Outpatient Medications on File Prior to Visit  Medication Sig Dispense Refill  . acetaminophen (TYLENOL) 650 MG CR tablet Take 650 mg by mouth every 8 (eight) hours as needed for pain.    Marland Kitchen atorvastatin (LIPITOR) 20 MG tablet TAKE (1/2) TABLET DAILY. 45 tablet 8  . diltiazem (CARDIZEM CD) 180 MG 24 hr capsule TAKE (1) CAPSULE DAILY. 90 capsule 1  . Docusate Sodium (STOOL SOFTENER LAXATIVE PO) Take 3-4 capsules by mouth daily.    Marland Kitchen ELIQUIS 5 MG TABS tablet TAKE ONE TABLET BY MOUTH TWICE DAILY 60 tablet 0  . flecainide  (TAMBOCOR) 100 MG tablet TAKE 1 TABLET BY MOUTH TWICE DAILY. 180 tablet 2  . hydrochlorothiazide (HYDRODIURIL) 25 MG tablet TAKE 1 TABLET EACH DAY. 90 tablet 0  . olmesartan (BENICAR) 5 MG tablet TAKE 1 TABLET ONCE DAILY. 90 tablet 2   No current facility-administered medications on file prior to visit.    Past Medical History:  Diagnosis Date  . Arthritis   . Bilateral cataracts    Dr Kathrin Penner  . Hyperlipidemia   . Hypertension     Past Surgical History:  Procedure Laterality Date  . COLONOSCOPY     Dr Fuller Plan  . 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 OVARIAN CYST REMOVAL      Social History   Socioeconomic History  . Marital status: Married    Spouse name: Not on file  . Number of children: Not on file  . Years of education: Not on file  . Highest education level: Not on file  Occupational History  . Not on file  Tobacco Use  . Smoking status: Former Smoker    Quit date: 08/27/1984    Years since quitting: 36.0  . Smokeless tobacco: Never Used  . Tobacco comment: smoked age [age], up to 1.5-2 ppd  Vaping Use  . Vaping Use: Never used  Substance and Sexual Activity  . Alcohol use: Yes  Alcohol/week: 3.0 standard drinks    Types: 3 Cans of beer per week    Comment: 3 x weekly  . Drug use: No  . Sexual activity: Not on file  Other Topics Concern  . Not on file  Social History Narrative   Exercise: none   Social Determinants of Health   Financial Resource Strain: Not on file  Food Insecurity: Not on file  Transportation Needs: Not on file  Physical Activity: Not on file  Stress: Not on file  Social Connections: Not on file    Family History  Adopted: Yes  Problem Relation Age of Onset  . Colon cancer Neg Hx   . Stomach cancer Neg Hx   . Breast cancer Neg Hx     Review of Systems  Constitutional: Negative for fever.  Musculoskeletal: Positive for arthralgias and joint swelling.  Skin: Negative for color change.  Neurological: Positive  for weakness. Negative for numbness.       Objective:   Vitals:   09/16/20 1416  BP: 110/78  Pulse: 68  Temp: 99.1 F (37.3 C)  SpO2: 97%   BP Readings from Last 3 Encounters:  09/16/20 110/78  09/16/20 110/78  04/17/20 120/80   Wt Readings from Last 3 Encounters:  09/16/20 173 lb (78.5 kg)  04/17/20 173 lb 3.2 oz (78.6 kg)  11/14/19 173 lb 6.4 oz (78.7 kg)   Body mass index is 28.82 kg/m.   Physical Exam    A left knee exam was performed.   SKIN: intact SWELLING: mild  EFFUSION: yes  WARMTH: no warmth  TENDERNESS: no tenderness  ROM: decreased extension, decreased flexion  GAIT: unable to bear weight on right leg  NEUROLOGICAL EXAM: normal sensation  CALF TENDERNESS: no        Assessment & Plan:    See Problem List for Assessment and Plan of chronic medical problems.    This visit occurred during the SARS-CoV-2 public health emergency.  Safety protocols were in place, including screening questions prior to the visit, additional usage of staff PPE, and extensive cleaning of exam room while observing appropriate contact time as indicated for disinfecting solutions.

## 2020-09-16 NOTE — Patient Instructions (Addendum)
   Medications changes include :   none     A referral was ordered for sports medicine.         Someone from their office will call you to schedule an appointment.

## 2020-09-16 NOTE — Assessment & Plan Note (Signed)
Acute With swelling and effusion, unable to bear weight, dec flexion and extension Will be able to see sports med today so will let Dr Georgina Snell evaluate and treat

## 2020-09-16 NOTE — Progress Notes (Signed)
Fontaine No, am serving as a scribe for Dr. [name].    Subjective:    I'm seeing this patient as a consultation for: Dr. Billey Gosling. Note will be routed back to referring provider/PCP.  CC: Left knee pain  HPI: Pt is a 75 y/o female c/o L knee pain ongoing since 09/08/20. MOI: Pt cannot recall a direct mechanism, but speculates "overdoing it" on Thursday and then pain progressively worsened. Pt typically ambulates w/ a cane, but has had to switch to a walker. Pt locates pain to posterior aspect of knee. Unable to flex knee. Denies any calf pain. Pain is sharp in character.   L knee swelling: no Mechanical symptoms: Aggravates: Treatments tried:  Past medical history, Surgical history, Family history, Social history, Allergies, and medications have been entered into the medical record, reviewed.   Review of Systems: No new headache, visual changes, nausea, vomiting, diarrhea, constipation, dizziness, abdominal pain, skin rash, fevers, chills, night sweats, weight loss, swollen lymph nodes, body aches, joint swelling, muscle aches, chest pain, shortness of breath, mood changes, visual or auditory hallucinations.   Objective:    Vitals:   09/16/20 1513  BP: 110/78  Pulse: 68  SpO2: 97%   General: Well Developed, well nourished, and in no acute distress.  Neuro/Psych: Alert and oriented x3, extra-ocular muscles intact, able to move all 4 extremities, sensation grossly intact. Skin: Warm and dry, no rashes noted.  Respiratory: Not using accessory muscles, speaking in full sentences, trachea midline.  Cardiovascular: Pulses palpable, no extremity edema. Abdomen: Does not appear distended. MSK: Left knee mild effusion normal appearing otherwise. Tender palpation medial joint line. Nontender posterior knee. Range of motion limited extension. Intact strength. Antalgic gait.  Lab and Radiology Results  X-ray images left knee obtained today personally and independently  interpreted Severe patellofemoral DJD.  Mild to moderate medial compartment DJD.  Chondrocalcinosis is present. Await formal radiology review  Procedure: Real-time Ultrasound Guided Injection of left knee superior lateral patellar space Device: Philips Affiniti 50G Images permanently stored and available for review in PACS Ultrasound evaluation left knee prior to injection reveals moderate joint effusion.  Degenerative changes.  Small Baker's cyst present. Verbal informed consent obtained.  Discussed risks and benefits of procedure. Warned about infection bleeding damage to structures skin hypopigmentation and fat atrophy among others. Patient expresses understanding and agreement Time-out conducted.   Noted no overlying erythema, induration, or other signs of local infection.   Skin prepped in a sterile fashion.   Local anesthesia: Topical Ethyl chloride.   With sterile technique and under real time ultrasound guidance:  40 mg of Kenalog and 2 mL of Marcaine injected into knee joint. Fluid seen entering the joint capsule.   Completed without difficulty   Pain immediately resolved suggesting accurate placement of the medication.   Advised to call if fevers/chills, erythema, induration, drainage, or persistent bleeding.   Images permanently stored and available for review in the ultrasound unit.  Impression: Technically successful ultrasound guided injection.       Impression and Recommendations:    Assessment and Plan: 75 y.o. female with left knee pain due to exacerbation of arthritis versus possible pseudogout.  Patient does have a Baker's cyst but she is not especially painful in this area so I do not think it is clinically significant as pain arising from the main part of the knee joint. Discussed treatment options.  Plan for steroid injection today.  Also recommend Voltaren gel.  Recheck in 1 month.  If not improved would consider aspiration and injection Baker's cyst possibly.   Also consider colchicine for chondrocalcinosis (pseudogout ) seen on x-ray however colchicine has significant drug drug interactions with both atorvastatin and diltiazem so certainly is not an ideal medicine.  PDMP not reviewed this encounter. Orders Placed This Encounter  Procedures  . [place] LIMITED JOINT SPACE STRUCTURES LOW LEFT(NO LINKED CHARGES)    Order Specific Question:   Reason for Exam (SYMPTOM  OR DIAGNOSIS REQUIRED)    Answer:   left knee pain    Order Specific Question:   Preferred imaging location?    Answer:   [place]  . DG Knee AP/LAT W/[place] Left    Standing Status:   Future    Number of Occurrences:   1    Standing Expiration Date:   09/16/2021    Order Specific Question:   Reason for Exam (SYMPTOM  OR DIAGNOSIS REQUIRED)    Answer:   left knee pain    Order Specific Question:   Preferred imaging location?    Answer:   [facility]   No orders of the defined types were placed in this encounter.   Discussed warning signs or symptoms. Please see discharge instructions. Patient expresses understanding.   The above documentation has been reviewed and is accurate and complete [name], M.D.

## 2020-09-17 ENCOUNTER — Telehealth: Payer: Self-pay | Admitting: Cardiovascular Disease

## 2020-09-17 NOTE — Telephone Encounter (Signed)
Pt c/o medication issue:  1. Name of Medication:  ELIQUIS 5 MG TABS tablet  2. How are you currently taking this medication (dosage and times per day)?  As prescribed  3. Are you having a reaction (difficulty breathing--STAT)?  No   4. What is your medication issue?   Patient states she has recently been experiencing knee pain. She states she was referred to sports medicine group and she consulted with them and began steroid injections in her knee, which relieved a lot of the pain. She states their provider is recommending Voltaren. She would like to know if it is alright to take with Eliquis. Please advise.

## 2020-09-17 NOTE — Telephone Encounter (Signed)
Advised ok to use gel and Glucosamine per Alena Bills D

## 2020-09-18 NOTE — Progress Notes (Signed)
Left knee x-ray shows arthritis and possible pseudogout.  We will discuss this in full detail at recheck in 1 month.   .  The steroid injection performed in clinic should help

## 2020-10-02 ENCOUNTER — Other Ambulatory Visit: Payer: Self-pay | Admitting: Internal Medicine

## 2020-10-13 NOTE — Progress Notes (Signed)
Sharon Knight, am serving as a Education administrator for Dr. Lynne Knight.   Sharon Knight is a 75 y.o. female who presents to [place] [facility] today for f/u L knee pain ongoing since 09/08/20. MOI: Pt cannot recall a direct mechanism, but speculates "overdoing it" on Thursday and then pain progressively worsened. Pt was last seen by Sharon Knight on 09/16/20 and was given a L knee steroid injection and was advised to use Voltaren gel. Today, pt reports that she has been doing okay since last visit is able to walk with a cane. Patient is not wanting to use the Voltaren gel because she is afraid to use anything that may effect her health. Patient is using a knee brace/compression sleeve. Patient is just worried that her stamina is way down, can only do a few things and then feels warn out. Patient is wanting to talk about the glucosamine to see if that may help with her knee.   Dx imaging: 09/16/20 L knee XR  Pertinent review of systems: No fevers or chills  Relevant historical information: History of gout   Exam:  BP 122/74 (BP Location: Left Arm, Patient Position: Sitting, Cuff Size: Normal)   Pulse 71   Ht 5\' 5"  (1.651 m)   Wt 173 lb (78.5 kg)   SpO2 97%   BMI 28.79 kg/m  General: Well Developed, well nourished, and in no acute distress.   MSK: Left knee minimal effusion normal motion stable ligamentous exam.    Lab and Radiology Results  EXAM: LEFT KNEE 3 VIEWS   COMPARISON:  None.   FINDINGS: Frontal, lateral, and sunrise patellar images were obtained. No fracture or dislocation. No joint effusion. There is severe narrowing of the patellofemoral joint. There is extensive chondrocalcinosis. There is spurring in the posterior patella and lateral compartments. No erosive change. There are foci of arterial vascular calcification.   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 There is extensive chondrocalcinosis which may be seen  with osteoarthritis or calcium pyrophosphate deposition disease. No acute fracture, dislocation, or joint effusion. Foci of atherosclerotic arterial vascular calcification noted.     Electronically Signed   By: Lowella Grip III M.D.   On: 09/18/2020 14:34   I, Sharon Knight, personally (independently) visualized and performed the interpretation of the images attached in this note.      Assessment and Plan: 75 y.o. female with left knee pain multifactorial.  Pain thought to be due to DJD especially patellofemoral DJD.  Additionally patient has a history of gout and has chondrocalcinosis which could be due to pseudogout.  She is not a good candidate for oral NSAIDs.  Additionally she is a less good candidate for colchicine given atorvastatin and diltiazem which could interfere with this medicine.  Therefore management of pseudogout with oral medicines will be challenging.  We will go and check uric acid level to assess for gout management options.  Meantime recommend Voltaren gel.  I think she is a good candidate for physical therapy.  She would like to try some home exercises on her own.  She will let me know when and if she wants a referral.     Recheck in 3 months.   PDMP not reviewed this encounter. Orders Placed This Encounter  Procedures   Uric acid    Standing Status:   Future    Number of Occurrences:   1    Standing Expiration Date:   2/50/5397   Basic Metabolic  Panel (BMET)   No orders of the defined types were placed in this encounter.    Discussed warning signs or symptoms. Please see discharge instructions. Patient expresses understanding.   The above documentation has been reviewed and is accurate and complete Sharon Knight, M.D.

## 2020-10-14 ENCOUNTER — Encounter: Payer: Self-pay | Admitting: Family Medicine

## 2020-10-14 ENCOUNTER — Ambulatory Visit: Payer: Medicare Other | Admitting: Family Medicine

## 2020-10-14 ENCOUNTER — Other Ambulatory Visit: Payer: Self-pay

## 2020-10-14 VITALS — BP 122/74 | HR 71 | Ht 65.0 in | Wt 173.0 lb

## 2020-10-14 DIAGNOSIS — M25562 Pain in left knee: Secondary | ICD-10-CM | POA: Diagnosis not present

## 2020-10-14 DIAGNOSIS — M109 Gout, unspecified: Secondary | ICD-10-CM | POA: Diagnosis not present

## 2020-10-14 LAB — BASIC METABOLIC PANEL
BUN: 21 mg/dL (ref 6–23)
CO2: 30 mEq/L (ref 19–32)
Calcium: 9.9 mg/dL (ref 8.4–10.5)
Chloride: 100 mEq/L (ref 96–112)
Creatinine, Ser: 1.21 mg/dL — ABNORMAL HIGH (ref 0.40–1.20)
GFR: 43.61 mL/min — ABNORMAL LOW (ref >60.00)
Glucose, Bld: 115 mg/dL — ABNORMAL HIGH (ref 70–99)
Potassium: 3.7 mEq/L (ref 3.5–5.1)
Sodium: 140 mEq/L (ref 135–145)

## 2020-10-14 LAB — URIC ACID: Uric Acid, Serum: 7.3 mg/dL — ABNORMAL HIGH (ref 2.4–7.0)

## 2020-10-14 NOTE — Patient Instructions (Addendum)
Thank you for coming in today.   Please get labs today before you leave.  OK to glucosamine.   Please use Voltaren gel (Generic Diclofenac Gel) up to 4x daily for pain as needed.  This is available over-the-counter as both the name brand Voltaren gel and the generic diclofenac gel.   Recheck in 3 months.   I would like to get in with physical therapy to work on leg strength, balance and gait and maybe endurance.   Let me know if you want a referral.

## 2020-10-15 ENCOUNTER — Telehealth: Payer: Self-pay | Admitting: Internal Medicine

## 2020-10-15 MED ORDER — ALLOPURINOL 100 MG PO TABS: 100.0000 mg | ORAL_TABLET | Freq: Every day | ORAL | 1 refills | Status: DC

## 2020-10-15 NOTE — Addendum Note (Signed)
Addended by: Gregor Hams on: 10/15/2020 06:59 AM   Modules accepted: Orders

## 2020-10-15 NOTE — Progress Notes (Signed)
[place]    Subjective:    Patient ID: Sharon Knight, female    DOB: [birth year], 75 y.o.   MRN: [id number]  HPI The patient is here for follow up of their chronic medical problems, including Afib, htn, DM, hld, CKD, knee pain - gout, OA  She does not want to take the allopurinol - wants to work on her diet first.  Her knee is a little bit better.  She does use a cane to ambulate.  She drinks fizzy water, tea and coffee.    Medications and allergies reviewed with patient and updated if appropriate.  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 03/16/2019   Diabetes mellitus without complication ([place]) 24/40/1027   Bilateral low back pain 03/17/2018   Gout 12/21/2017   Lobar pneumonia ([place]) 12/10/2017   Paroxysmal atrial fibrillation ([place]) 12/10/2017   History of colonic polyps 10/22/2014   History of nephrectomy, unilateral 11/13/2012   Vitamin D deficiency 10/31/2012   ECZEMA 03/03/2010   Benign neoplasm of colon 08/02/2007   Hyperlipidemia 08/02/2007   Essential hypertension 08/02/2007   Osteopenia 08/02/2007    Current Outpatient Medications on File Prior to Visit  Medication Sig Dispense Refill   acetaminophen (TYLENOL) 650 MG CR tablet Take 650 mg by mouth every 8 (eight) hours as needed for pain.     atorvastatin (LIPITOR) 20 MG tablet TAKE (1/2) TABLET DAILY. 45 tablet 8   Cholecalciferol (VITAMIN D3 PO) Take 1,000 Int'l Units/day by mouth.     Cyanocobalamin (VITAMIN B-12 PO) Take by mouth.     diltiazem (CARDIZEM CD) 180 MG 24 hr capsule TAKE (1) CAPSULE DAILY. 90 capsule 1   Docusate Sodium (STOOL SOFTENER LAXATIVE PO) Take 3-4 capsules by mouth daily.     ELIQUIS 5 MG TABS tablet TAKE ONE TABLET BY MOUTH TWICE DAILY 60 tablet 0   flecainide (TAMBOCOR) 100 MG tablet TAKE 1 TABLET BY  MOUTH TWICE DAILY. 180 tablet 2   hydrochlorothiazide (HYDRODIURIL) 25 MG tablet TAKE 1 TABLET EACH DAY. 90 tablet 0   olmesartan (BENICAR) 5 MG tablet TAKE 1 TABLET ONCE DAILY. 90 tablet 2   No current facility-administered medications on file prior to visit.    Past Medical History:  Diagnosis Date   Arthritis    Bilateral cataracts    Dr Kathrin Penner   Hyperlipidemia    Hypertension     Past Surgical History:  Procedure Laterality Date   COLONOSCOPY     Dr Fuller Plan   NEPHRECTOMY     right; for cyst on kidney   OVARIAN CYST REMOVAL      Social History   Socioeconomic History   Marital status: Married    Spouse name: Not on file   Number of children: Not on file   Years of education: Not on file   Highest education level: Not on file  Occupational History   Not on file  Tobacco Use   Smoking status: Former    Pack years: 0.00    Types: Cigarettes    Quit date: 08/27/1984    Years since quitting: 36.1   Smokeless tobacco: Never   Tobacco comments:    smoked age [age], up to 1.5-2 ppd  Vaping Use   Vaping Use: Never used  Substance and Sexual Activity  Alcohol use: Yes    Alcohol/week: 3.0 standard drinks    Types: 3 Cans of beer per week    Comment: 3 x weekly   Drug use: No   Sexual activity: Not on file  Other Topics Concern   Not on file  Social History Narrative   Exercise: none   Social Determinants of Health   Financial Resource Strain: Not on file  Food Insecurity: Not on file  Transportation Needs: Not on file  Physical Activity: Not on file  Stress: Not on file  Social Connections: Not on file    Family History  Adopted: Yes  Problem Relation Age of Onset   Colon cancer Neg Hx    Stomach cancer Neg Hx    Breast cancer Neg Hx     Review of Systems  Constitutional:  Negative for fever.  Respiratory:  Negative for cough, shortness of breath and wheezing.   Cardiovascular:  Negative for chest pain, palpitations and leg swelling.   Musculoskeletal:  Positive for arthralgias.  Neurological:  Negative for light-headedness and headaches.      Objective:   Vitals:   10/16/20 1022  BP: 126/80  Pulse: 81  Temp: 98.2 F (36.8 C)  SpO2: 98%   BP Readings from Last 3 Encounters:  10/16/20 126/80  10/14/20 122/74  09/16/20 110/78   Wt Readings from Last 3 Encounters:  10/16/20 170 lb (77.1 kg)  10/14/20 173 lb (78.5 kg)  09/16/20 173 lb (78.5 kg)   Body mass index is 28.29 kg/m.   Physical Exam    Constitutional: Appears well-developed and well-nourished. No distress.  HENT:  Head: Normocephalic and atraumatic.  Neck: Neck supple. No tracheal deviation present. No thyromegaly present.  No cervical lymphadenopathy Cardiovascular: Normal rate, regular rhythm and normal heart sounds.   No murmur heard. No carotid bruit .  No edema Pulmonary/Chest: Effort normal and breath sounds normal. No respiratory distress. No has no wheezes. No rales.  Skin: Skin is warm and dry. Not diaphoretic.  Psychiatric: Normal mood and affect. Behavior is normal.      Assessment & Plan:    See Problem List for Assessment and Plan of chronic medical problems.    This visit occurred during the SARS-CoV-2 public health emergency.  Safety protocols were in place, including screening questions prior to the visit, additional usage of staff PPE, and extensive cleaning of exam room while observing appropriate contact time as indicated for disinfecting solutions.

## 2020-10-15 NOTE — Progress Notes (Signed)
Spoke w/ pt and conveyed Dr. Corey's result note. Pt verbalized understanding.  

## 2020-10-15 NOTE — Patient Instructions (Addendum)
  Blood work was ordered.      Medications changes include :   start tart cherry pills    Please followup in 6 months

## 2020-10-15 NOTE — Telephone Encounter (Signed)
LVM for pt to rtn my call to schedule AWV with [facility]. Please schedule AWV if pt calls the office  

## 2020-10-15 NOTE — Progress Notes (Signed)
Uric acid is elevated at 7.3.  This could cause gout.  Plan to start allopurinol at low-dose of 100 mg/day.  Please start taking it daily.  Your kidney function has worsened slightly.  Dr. [name] will talk to you more about that when you see her next week.  Follow-up with me as scheduled in September.

## 2020-10-16 ENCOUNTER — Other Ambulatory Visit: Payer: Self-pay

## 2020-10-16 ENCOUNTER — Ambulatory Visit (INDEPENDENT_AMBULATORY_CARE_PROVIDER_SITE_OTHER): Payer: Medicare Other | Admitting: Internal Medicine

## 2020-10-16 ENCOUNTER — Encounter: Payer: Self-pay | Admitting: Internal Medicine

## 2020-10-16 VITALS — BP 126/80 | HR 81 | Temp 98.2°F | Ht 65.0 in | Wt 170.0 lb

## 2020-10-16 DIAGNOSIS — I10 Essential (primary) hypertension: Secondary | ICD-10-CM

## 2020-10-16 DIAGNOSIS — N1831 Chronic kidney disease, stage 3a: Secondary | ICD-10-CM

## 2020-10-16 DIAGNOSIS — I480 Paroxysmal atrial fibrillation: Secondary | ICD-10-CM | POA: Diagnosis not present

## 2020-10-16 DIAGNOSIS — I700 Atherosclerosis of aorta: Secondary | ICD-10-CM

## 2020-10-16 DIAGNOSIS — E7849 Other hyperlipidemia: Secondary | ICD-10-CM | POA: Diagnosis not present

## 2020-10-16 DIAGNOSIS — E119 Type 2 diabetes mellitus without complications: Secondary | ICD-10-CM | POA: Diagnosis not present

## 2020-10-16 DIAGNOSIS — M109 Gout, unspecified: Secondary | ICD-10-CM

## 2020-10-16 LAB — COMPREHENSIVE METABOLIC PANEL
ALT: 16 U/L (ref 0–35)
AST: 18 U/L (ref 0–37)
Albumin: 4.4 g/dL (ref 3.5–5.2)
Alkaline Phosphatase: 95 U/L (ref 39–117)
BUN: 20 mg/dL (ref 6–23)
CO2: 30 mEq/L (ref 19–32)
Calcium: 10 mg/dL (ref 8.4–10.5)
Chloride: 101 mEq/L (ref 96–112)
Creatinine, Ser: 1.15 mg/dL (ref 0.40–1.20)
GFR: 46.35 mL/min — ABNORMAL LOW (ref >60.00)
Glucose, Bld: 105 mg/dL — ABNORMAL HIGH (ref 70–99)
Potassium: 3.7 mEq/L (ref 3.5–5.1)
Sodium: 139 mEq/L (ref 135–145)
Total Bilirubin: 0.5 mg/dL (ref 0.2–1.2)
Total Protein: 7.8 g/dL (ref 6.0–8.3)

## 2020-10-16 LAB — HEMOGLOBIN A1C: Hgb A1c MFr Bld: 7.1 % — ABNORMAL HIGH (ref 4.6–6.5)

## 2020-10-16 NOTE — Assessment & Plan Note (Signed)
Chronic Continue atorvastatin 10 mg daily Lipid panel to be checked in 6 months Work on diet, exercise and weight loss

## 2020-10-16 NOTE — Assessment & Plan Note (Signed)
History of gout years ago, possible pseudogout in her knee uric acid level elevated and she was prescribed allopurinol, but does not want to take it She will work on diet changes Discussed that she can try tart cherry supplements

## 2020-10-16 NOTE — Assessment & Plan Note (Signed)
Chronic BP well controlled  Continue diltiazem mg qd, hctz mg qd, benicar  Mg qd cmp

## 2020-10-16 NOTE — Assessment & Plan Note (Addendum)
Chronic recently slightly worse cmp Increase water intake

## 2020-10-16 NOTE — Assessment & Plan Note (Signed)
Chronic Asymptomatic Following with cardiology On flecainide, Eliquis and diltiazem

## 2020-10-16 NOTE — Assessment & Plan Note (Signed)
Chronic Diet controlled Check A1c Stressed increasing her activity Low sugar/carbohydrate diet

## 2020-10-16 NOTE — Assessment & Plan Note (Addendum)
Chronic Continue atorvastatin 10 mg daily  Lab Results  Component Value Date   LDLCALC 85 04/17/2020   Discussed increasing exercise and improving diet, which she is doing Will reevaluate in 6 months and see if we need to increase the statin

## 2020-10-20 ENCOUNTER — Other Ambulatory Visit: Payer: Self-pay | Admitting: Cardiovascular Disease

## 2020-10-22 ENCOUNTER — Telehealth: Payer: Medicare Other

## 2020-10-30 ENCOUNTER — Ambulatory Visit: Payer: Medicare Other

## 2020-10-30 ENCOUNTER — Other Ambulatory Visit: Payer: Self-pay | Admitting: Internal Medicine

## 2020-10-30 ENCOUNTER — Other Ambulatory Visit: Payer: Self-pay

## 2020-10-30 ENCOUNTER — Ambulatory Visit (INDEPENDENT_AMBULATORY_CARE_PROVIDER_SITE_OTHER): Payer: Medicare Other

## 2020-10-30 VITALS — BP 118/60 | HR 62 | Temp 97.9°F | Ht 65.0 in | Wt 168.8 lb

## 2020-10-30 DIAGNOSIS — Z0000 Encounter for general adult medical examination without abnormal findings: Secondary | ICD-10-CM

## 2020-10-30 NOTE — Progress Notes (Signed)
Subjective:   Sharon Knight is a 75 y.o. female who presents for Medicare Annual (Subsequent) preventive examination.  Review of Systems     Cardiac Risk Factors include: advanced age (>80men, >44 women);diabetes mellitus;dyslipidemia;hypertension     Objective:    Today's Vitals   10/30/20 1012 10/30/20 1013  BP:  118/60  Pulse:  62  Temp:  97.9 F (36.6 C)  SpO2:  98%  Weight:  168 lb 12.8 oz (76.6 kg)  Height:  5\' 5"  (1.651 m)  PainSc: 0-No pain 0-No pain   Body mass index is 28.09 kg/m.  Advanced Directives 10/30/2020 09/14/2019 05/24/2018 12/10/2017 12/10/2017 02/19/2017 02/11/2015  Does Patient Have a Medical Advance Directive? No No No No No No No  Would patient like information on creating a medical advance directive? No - Patient declined No - Patient declined Yes (MAU/Ambulatory/Procedural Areas - Information given) No - Patient declined - No - Patient declined Yes - Educational materials given    Current Medications (verified) Outpatient Encounter Medications as of 10/30/2020  Medication Sig   acetaminophen (TYLENOL) 650 MG CR tablet Take 650 mg by mouth every 8 (eight) hours as needed for pain.   atorvastatin (LIPITOR) 20 MG tablet TAKE (1/2) TABLET DAILY.   Cholecalciferol (VITAMIN D3 PO) Take 1,000 Int'l Units/day by mouth.   Cyanocobalamin (VITAMIN B-12 PO) Take by mouth.   diltiazem (CARDIZEM CD) 180 MG 24 hr capsule TAKE (1) CAPSULE DAILY.   Docusate Sodium (STOOL SOFTENER LAXATIVE PO) Take 3-4 capsules by mouth daily.   ELIQUIS 5 MG TABS tablet TAKE ONE TABLET BY MOUTH TWICE DAILY   flecainide (TAMBOCOR) 100 MG tablet TAKE 1 TABLET BY MOUTH TWICE DAILY.   hydrochlorothiazide (HYDRODIURIL) 25 MG tablet TAKE ONE TABLET DAILY   olmesartan (BENICAR) 5 MG tablet TAKE 1 TABLET ONCE DAILY.   No facility-administered encounter medications on file as of 10/30/2020.    Allergies (verified) Ibuprofen   History: Past Medical History:  Diagnosis Date   Arthritis     Bilateral cataracts    Dr Kathrin Penner   Hyperlipidemia    Hypertension    Past Surgical History:  Procedure Laterality Date   COLONOSCOPY     Dr Fuller Plan   NEPHRECTOMY     right; for cyst on kidney   OVARIAN CYST REMOVAL     Family History  Adopted: Yes  Problem Relation Age of Onset   Colon cancer Neg Hx    Stomach cancer Neg Hx    Breast cancer Neg Hx    Social History   Socioeconomic History   Marital status: Married    Spouse name: Not on file   Number of children: Not on file   Years of education: Not on file   Highest education level: Not on file  Occupational History   Not on file  Tobacco Use   Smoking status: Former    Pack years: 0.00    Types: Cigarettes    Quit date: 08/27/1984    Years since quitting: 36.2   Smokeless tobacco: Never   Tobacco comments:    smoked age [age], up to 1.5-2 ppd  Vaping Use   Vaping Use: Never used  Substance and Sexual Activity   Alcohol use: Yes    Alcohol/week: 3.0 standard drinks    Types: 3 Cans of beer per week    Comment: 3 x weekly   Drug use: No   Sexual activity: Not on file  Other Topics Concern   Not on file  Social History Narrative   Exercise: none   Social Determinants of Radio broadcast assistant Strain: Low Risk    Difficulty of Paying Living Expenses: Not hard at all  Food Insecurity: No Food Insecurity   Worried About Charity fundraiser in the Last Year: Never true   Arboriculturist in the Last Year: Never true  Transportation Needs: No Transportation Needs   Lack of Transportation (Medical): No   Lack of Transportation (Non-Medical): No  Physical Activity: Inactive   Days of Exercise per Week: 0 days   Minutes of Exercise per Session: 0 min  Stress: No Stress Concern Present   Feeling of Stress : Not at all  Social Connections: Socially Integrated   Frequency of Communication with Friends and Family: More than three times a week   Frequency of Social Gatherings with Friends and Family:  More than three times a week   Attends Religious Services: More than 4 times per year   Active Member of [employer] or Organizations: No   Attends Music therapist: More than 4 times per year   Marital Status: Married    Tobacco Counseling Counseling given: Not Answered Tobacco comments: smoked age [age], up to 1.5-2 ppd   Clinical Intake:  Pre-visit preparation completed: Yes  Pain : No/denies pain Pain Score: 0-No pain     BMI - recorded: 28.09 Nutritional Status: BMI 25 -29 Overweight Nutritional Risks: None Diabetes: Yes CBG done?: No Did pt. bring in CBG monitor from home?: No  How often do you need to have someone help you when you read instructions, pamphlets, or other written materials from your doctor or pharmacy?: 1 - Never What is the last grade level you completed in school?: HSG  Diabetic? yes  Interpreter Needed?: No  Information entered by :: Lisette Abu, LPN   Activities of Daily Living In your present state of health, do you have any difficulty performing the following activities: 10/30/2020 10/16/2020  Hearing? N N  Vision? N N  Difficulty concentrating or making decisions? N N  Walking or climbing stairs? N N  Dressing or bathing? N N  Doing errands, shopping? N N  Preparing Food and eating ? N -  Using the Toilet? N -  In the past six months, have you accidently leaked urine? N -  Do you have problems with loss of bowel control? N -  Managing your Medications? N -  Managing your Finances? N -  Housekeeping or managing your Housekeeping? N -  Some recent data might be hidden    Patient Care Team: Binnie Rail, MD as PCP - General (Internal Medicine) Skeet Latch, MD as PCP - Cardiology (Cardiology) Constance Haw, MD as PCP - Electrophysiology (Cardiology) Charlton Haws, [facility] as Pharmacist (Pharmacist) Jalene Mullet, MD as Consulting Physician (Ophthalmology) Opthamology, [facility]  (Ophthalmology)  Indicate any recent Medical Services you may have received from other than [facility] providers in the past year (date may be approximate).     Assessment:   This is a routine wellness examination for [name].  Hearing/Vision screen Hearing Screening - Comments:: Patient denies any hearing difficulty. Vision Screening - Comments:: Patient wears no glasses/contacts.  Eye exam scheduled for 10/31/2020 at [place] with Mellody Life, MD.  Dietary issues and exercise activities discussed: Current Exercise Habits: The patient does not participate in regular exercise at present, Exercise limited by: orthopedic condition(s)   Goals Addressed   None   Depression Screen  PHQ 2/9 Scores 10/30/2020 10/16/2020 09/14/2019 03/17/2019 09/15/2018 03/17/2018 12/16/2017  PHQ - 2 Score 0 0 1 0 0 0 -  PHQ- 9 Score - - 2 - - - -  Exception Documentation - - - - - - Patient refusal    Fall Risk Fall Risk  10/30/2020 09/16/2020 09/14/2019 03/17/2019 09/15/2018  Falls in the past year? 0 0 1 0 0  Number falls in past yr: 0 0 0 0 -  Injury with Fall? 0 0 0 - -  Risk for fall due to : No Fall Risks No Fall Risks Impaired balance/gait;Impaired vision - -  Follow up Falls evaluation completed Falls evaluation completed Falls evaluation completed;Education provided;Falls prevention discussed - -    FALL RISK PREVENTION PERTAINING TO THE HOME:  Any stairs in or around the home? No  If so, are there any without handrails? No  Home free of loose throw rugs in walkways, pet beds, electrical cords, etc? Yes  Adequate lighting in your home to reduce risk of falls? No   ASSISTIVE DEVICES UTILIZED TO PREVENT FALLS:  Life alert? No  Use of a cane, walker or w/c? Yes  Grab bars in the bathroom? Yes  Shower chair or bench in shower? Yes  Elevated toilet seat or a handicapped toilet? Yes   TIMED UP AND GO:  Was the test performed? Yes .  Length of time to ambulate 10 feet: 10 sec.   Gait  steady and fast with assistive device  Cognitive Function: Normal cognitive status assessed by direct observation by this Nurse Health Advisor. No abnormalities found.   MMSE - Mini Mental State Exam 02/19/2017  Orientation to time 5  Orientation to Place 5  Registration 3  Attention/ Calculation 4  Recall 2  Language- name 2 objects 2  Language- repeat 1  Language- follow 3 step command 3  Language- read & follow direction 1  Write a sentence 1  Copy design 1  Total score 28     6CIT Screen 09/14/2019  What Year? 0 points  What month? 0 points  What time? 0 points  Count back from 20 0 points  Months in reverse 0 points  Repeat phrase 0 points  Total Score 0    Immunizations Immunization History  Administered Date(s) Administered   Fluad Quad(high Dose 65+) 01/21/2019, 02/01/2020   Influenza Split 02/18/2011, 02/18/2012   Influenza Whole 03/02/2007, 02/03/2008, 02/08/2009, 02/25/2010   Influenza, High Dose Seasonal PF 02/14/2013, 02/05/2016, 02/03/2017, 02/15/2018   Influenza,inj,Quad PF,6+ Mos 02/20/2014, 02/11/2015   Influenza-Unspecified 02/02/2016   PFIZER(Purple Top)SARS-COV-2 Vaccination 07/15/2019, 08/09/2019, 04/04/2020   Pneumococcal Conjugate-13 02/12/2015   Pneumococcal Polysaccharide-23 02/19/2017   Td 03/03/2010   Zoster Recombinat (Shingrix) 02/28/2020, 07/16/2020   Zoster, Live 11/02/2012    TDAP status: Due, Education has been provided regarding the importance of this vaccine. Advised may receive this vaccine at local pharmacy or Health Dept. Aware to provide a copy of the vaccination record if obtained from local pharmacy or Health Dept. Verbalized acceptance and understanding.  Flu Vaccine status: Up to date  Pneumococcal vaccine status: Up to date  Covid-19 vaccine status: Completed vaccines  Qualifies for Shingles Vaccine? Yes   Zostavax completed Yes   Shingrix Completed?: Yes  Screening Tests Health Maintenance  Topic Date Due   DEXA  SCAN  07/02/2017   FOOT EXAM  03/16/2020   COVID-19 Vaccine (4 - Booster for Pfizer series) 07/03/2020   TETANUS/TDAP  04/17/2021 (Originally 03/03/2020)   INFLUENZA VACCINE  12/02/2020   OPHTHALMOLOGY EXAM  12/03/2020   HEMOGLOBIN A1C  04/17/2021   Hepatitis C Screening  Completed   PNA vac Low Risk Adult  Completed   Zoster Vaccines- Shingrix  Completed   HPV VACCINES  Aged Out    Health Maintenance  Health Maintenance Due  Topic Date Due   DEXA SCAN  07/02/2017   FOOT EXAM  03/16/2020   COVID-19 Vaccine (4 - Booster for [place] series) 07/03/2020    Colorectal cancer screening: Type of screening: Colonoscopy. Completed 09/11/2011. Repeat every 10 years  Mammogram status: Completed 03/25/2020. Repeat every year  Bone Density status: Completed 07/03/2015. Results reflect: Bone density results: OSTEOPENIA. Repeat every 2 years.  Lung Cancer Screening: (Low Dose CT Chest recommended if Age 80-80 years, 30 pack-year currently smoking OR have quit w/in 15years.) does not qualify.   Lung Cancer Screening Referral: no  Additional Screening:  Hepatitis C Screening: does qualify; Completed yes  Vision Screening: Recommended annual ophthalmology exams for early detection of glaucoma and other disorders of the eye. Is the patient up to date with their annual eye exam?  Yes  Who is the provider or what is the name of the office in which the patient attends annual eye exams? Mellody Life, MD (Retina Specialist) If pt is not established with a provider, would they like to be referred to a provider to establish care? No .   Dental Screening: Recommended annual dental exams for proper oral hygiene  Community Resource Referral / Chronic Care Management: CRR required this visit?  No   CCM required this visit?  No      Plan:     I have personally reviewed and noted the following in the patient's chart:   Medical and social history Use of alcohol, tobacco or illicit drugs  Current  medications and supplements including opioid prescriptions.  Functional ability and status Nutritional status Physical activity Advanced directives List of other physicians Hospitalizations, surgeries, and ER visits in previous 12 months Vitals Screenings to include cognitive, depression, and falls Referrals and appointments  In addition, I have reviewed and discussed with patient certain preventive protocols, quality metrics, and best practice recommendations. A written personalized care plan for preventive services as well as general preventive health recommendations were provided to patient.     Sheral Flow, LPN   06/27/8248   Nurse Notes: n/a

## 2020-10-30 NOTE — Patient Instructions (Signed)
Sharon Knight , Thank you for taking time to come for your Medicare Wellness Visit. I appreciate your ongoing commitment to your health goals. Please review the following plan we discussed and let me know if I can assist you in the future.   Screening recommendations/referrals: Colonoscopy: last done 09/11/2011 Mammogram: last done 03/25/2020 Bone Density: last done 07/03/2015 Recommended yearly ophthalmology/optometry visit for glaucoma screening and checkup Recommended yearly dental visit for hygiene and checkup  Vaccinations: Influenza vaccine: 02/01/2020 Pneumococcal vaccine: 02/12/2015, 02/19/2017 Tdap vaccine: overdue Shingles vaccine: 02/28/2020, 07/16/2020   Covid-19: 07/15/2019, 08/09/2019, 04/04/2020  Advanced directives: Advance directive discussed with you today. Even though you declined this today please call our office should you change your mind and we can give you the proper paperwork for you to fill out.  Conditions/risks identified: To increase my water intake ; adjust my diet and decrease red meat intake.  Next appointment: Please schedule your next Medicare Wellness Visit with your Nurse Health Advisor in 1 year by calling [phone].   Preventive Care 35 Years and Older, Female Preventive care refers to lifestyle choices and visits with your health care provider that can promote health and wellness. What does preventive care include? A yearly physical exam. This is also called an annual well check. Dental exams once or twice a year. Routine eye exams. Ask your health care provider how often you should have your eyes checked. Personal lifestyle choices, including: Daily care of your teeth and gums. Regular physical activity. Eating a healthy diet. Avoiding tobacco and drug use. Limiting alcohol use. Practicing safe sex. Taking low-dose aspirin every day. Taking vitamin and mineral supplements as recommended by your health care provider. What happens during an annual  well check? The services and screenings done by your health care provider during your annual well check will depend on your age, overall health, lifestyle risk factors, and family history of disease. Counseling  Your health care provider may ask you questions about your: Alcohol use. Tobacco use. Drug use. Emotional well-being. Home and relationship well-being. Sexual activity. Eating habits. History of falls. Memory and ability to understand (cognition). Work and work Statistician. Reproductive health. Screening  You may have the following tests or measurements: Height, weight, and BMI. Blood pressure. Lipid and cholesterol levels. These may be checked every 5 years, or more frequently if you are over 47 years old. Skin check. Lung cancer screening. You may have this screening every year starting at age 25 if you have a 30-pack-year history of smoking and currently smoke or have quit within the past 15 years. Fecal occult blood test (FOBT) of the stool. You may have this test every year starting at age 69. Flexible sigmoidoscopy or colonoscopy. You may have a sigmoidoscopy every 5 years or a colonoscopy every 10 years starting at age 51. Hepatitis C blood test. Hepatitis B blood test. Sexually transmitted disease (STD) testing. Diabetes screening. This is done by checking your blood sugar (glucose) after you have not eaten for a while (fasting). You may have this done every 1-3 years. Bone density scan. This is done to screen for osteoporosis. You may have this done starting at age 60. Mammogram. This may be done every 1-2 years. Talk to your health care provider about how often you should have regular mammograms. Talk with your health care provider about your test results, treatment options, and if necessary, the need for more tests. Vaccines  Your health care provider may recommend certain vaccines, such as: Influenza vaccine. This is  recommended every year. Tetanus, diphtheria,  and acellular pertussis (Tdap, Td) vaccine. You may need a Td booster every 10 years. Zoster vaccine. You may need this after age 28. Pneumococcal 13-valent conjugate (PCV13) vaccine. One dose is recommended after age 35. Pneumococcal polysaccharide (PPSV23) vaccine. One dose is recommended after age 70. Talk to your health care provider about which screenings and vaccines you need and how often you need them. This information is not intended to replace advice given to you by your health care provider. Make sure you discuss any questions you have with your health care provider. Document Released: 05/17/2015 Document Revised: 01/08/2016 Document Reviewed: 02/19/2015 Elsevier Interactive Patient Education  2017 [place] [employer] Prevention in the Home Falls can cause injuries. They can happen to people of all ages. There are many things you can do to make your home safe and to help prevent falls. What can I do on the outside of my home? Regularly fix the edges of walkways and driveways and fix any cracks. Remove anything that might make you trip as you walk through a door, such as a raised step or threshold. Trim any bushes or trees on the path to your home. Use bright outdoor lighting. Clear any walking paths of anything that might make someone trip, such as rocks or tools. Regularly check to see if handrails are loose or broken. Make sure that both sides of any steps have handrails. Any raised decks and porches should have guardrails on the edges. Have any leaves, snow, or ice cleared regularly. Use sand or salt on walking paths during winter. Clean up any spills in your garage right away. This includes oil or grease spills. What can I do in the bathroom? Use night lights. Install grab bars by the toilet and in the tub and shower. Do not use towel bars as grab bars. Use non-skid mats or decals in the tub or shower. If you need to sit down in the shower, use a plastic, non-slip  stool. Keep the floor dry. Clean up any water that spills on the floor as soon as it happens. Remove soap buildup in the tub or shower regularly. Attach bath mats securely with double-sided non-slip rug tape. Do not have throw rugs and other things on the floor that can make you trip. What can I do in the bedroom? Use night lights. Make sure that you have a light by your bed that is easy to reach. Do not use any sheets or blankets that are too big for your bed. They should not hang down onto the floor. Have a firm chair that has side arms. You can use this for support while you get dressed. Do not have throw rugs and other things on the floor that can make you trip. What can I do in the kitchen? Clean up any spills right away. Avoid walking on wet floors. Keep items that you use a lot in easy-to-reach places. If you need to reach something above you, use a strong step stool that has a grab bar. Keep electrical cords out of the way. Do not use floor polish or wax that makes floors slippery. If you must use wax, use non-skid floor wax. Do not have throw rugs and other things on the floor that can make you trip. What can I do with my stairs? Do not leave any items on the stairs. Make sure that there are handrails on both sides of the stairs and use them. Fix handrails that are  broken or loose. Make sure that handrails are as long as the stairways. Check any carpeting to make sure that it is firmly attached to the stairs. Fix any carpet that is loose or worn. Avoid having throw rugs at the top or bottom of the stairs. If you do have throw rugs, attach them to the floor with carpet tape. Make sure that you have a light switch at the top of the stairs and the bottom of the stairs. If you do not have them, ask someone to add them for you. What else can I do to help prevent falls? Wear shoes that: Do not have high heels. Have rubber bottoms. Are comfortable and fit you well. Are closed at the  toe. Do not wear sandals. If you use a stepladder: Make sure that it is fully opened. Do not climb a closed stepladder. Make sure that both sides of the stepladder are locked into place. Ask someone to hold it for you, if possible. Clearly mark and make sure that you can see: Any grab bars or handrails. First and last steps. Where the edge of each step is. Use tools that help you move around (mobility aids) if they are needed. These include: Canes. Walkers. Scooters. Crutches. Turn on the lights when you go into a dark area. Replace any light bulbs as soon as they burn out. Set up your furniture so you have a clear path. Avoid moving your furniture around. If any of your floors are uneven, fix them. If there are any pets around you, be aware of where they are. Review your medicines with your doctor. Some medicines can make you feel dizzy. This can increase your chance of falling. Ask your doctor what other things that you can do to help prevent falls. This information is not intended to replace advice given to you by your health care provider. Make sure you discuss any questions you have with your health care provider. Document Released: 02/14/2009 Document Revised: 09/26/2015 Document Reviewed: 05/25/2014 Elsevier Interactive Patient Education  2017 [employer].

## 2020-10-31 DIAGNOSIS — H31092 Other chorioretinal scars, left eye: Secondary | ICD-10-CM | POA: Diagnosis not present

## 2020-10-31 DIAGNOSIS — H43812 Vitreous degeneration, left eye: Secondary | ICD-10-CM | POA: Diagnosis not present

## 2020-10-31 DIAGNOSIS — E113291 Type 2 diabetes mellitus with mild nonproliferative diabetic retinopathy without macular edema, right eye: Secondary | ICD-10-CM | POA: Diagnosis not present

## 2020-10-31 DIAGNOSIS — H43392 Other vitreous opacities, left eye: Secondary | ICD-10-CM | POA: Diagnosis not present

## 2020-11-11 ENCOUNTER — Other Ambulatory Visit: Payer: Self-pay

## 2020-11-11 ENCOUNTER — Ambulatory Visit
Admission: RE | Admit: 2020-11-11 | Discharge: 2020-11-11 | Disposition: A | Discharge status: Home / Self Care | Payer: Medicare Other | Source: Ambulatory Visit | Attending: Internal Medicine | Admitting: Internal Medicine

## 2020-11-11 ENCOUNTER — Other Ambulatory Visit: Payer: Self-pay | Admitting: Internal Medicine

## 2020-11-11 DIAGNOSIS — R921 Mammographic calcification found on diagnostic imaging of breast: Secondary | ICD-10-CM

## 2020-11-11 DIAGNOSIS — R922 Inconclusive mammogram: Secondary | ICD-10-CM | POA: Diagnosis not present

## 2020-11-18 ENCOUNTER — Telehealth: Payer: Self-pay | Admitting: Pharmacist

## 2020-11-18 NOTE — Chronic Care Management (AMB) (Addendum)
Chronic Care Management Pharmacy Assistant   Name: Sharon Knight  MRN: [id number] DOB: 1945/07/11   Reason for Encounter: Disease State - Hypertension     Recent office visits:  09/16/20 Billey Gosling (PCP) - Acute left knee pain  - no medication changes, referral placed for Sports Medicine - follow up as needed.  10/16/20 Billey Gosling (PCP) - Hypertension follow up - no medication changes, discussed increasing excercise and improving diet. Follow up in 6 months.    Recent consult visits:  09/16/20 - Sherene Sires (Sports Medicine) - left knee pain - XR of left knee ordered, [place] left knee oint ordered. Knee joint injection performed. Use Voltaren gel OTC 4 times daily topically as needed for pain. -  Follow up in 4 weeks  10/14/20- Ihor Dow (Sports Medicine) - left knee pain -  labs ordered. Prescription for Allopurinol 100 mg for elevated uric acid , Follow up as scheduled in September 2022.   Hospital visits:  None in previous 6 months  Medications: Outpatient Encounter Medications as of 11/18/2020  Medication Sig   acetaminophen (TYLENOL) 650 MG CR tablet Take 650 mg by mouth every 8 (eight) hours as needed for pain.   atorvastatin (LIPITOR) 20 MG tablet TAKE (1/2) TABLET DAILY.   Cholecalciferol (VITAMIN D3 PO) Take 1,000 Int'l Units/day by mouth.   Cyanocobalamin (VITAMIN B-12 PO) Take by mouth.   diltiazem (CARDIZEM CD) 180 MG 24 hr capsule TAKE (1) CAPSULE DAILY.   Docusate Sodium (STOOL SOFTENER LAXATIVE PO) Take 3-4 capsules by mouth daily.   ELIQUIS 5 MG TABS tablet TAKE ONE TABLET BY MOUTH TWICE DAILY   flecainide (TAMBOCOR) 100 MG tablet TAKE 1 TABLET BY MOUTH TWICE DAILY.   hydrochlorothiazide (HYDRODIURIL) 25 MG tablet TAKE ONE TABLET DAILY   olmesartan (BENICAR) 5 MG tablet TAKE 1 TABLET ONCE DAILY.   No facility-administered encounter medications on file as of 11/18/2020.   Reviewed chart prior to disease state call. Spoke with patient regarding BP  Recent  Office Vitals: BP Readings from Last 3 Encounters:  10/30/20 118/60  10/16/20 126/80  10/14/20 122/74   Pulse Readings from Last 3 Encounters:  10/30/20 62  10/16/20 81  10/14/20 71    Wt Readings from Last 3 Encounters:  10/30/20 168 lb 12.8 oz (76.6 kg)  10/16/20 170 lb (77.1 kg)  10/14/20 173 lb (78.5 kg)     Kidney Function Lab Results  Component Value Date/Time   CREATININE 1.15 10/16/2020 11:07 AM   CREATININE 1.21 (H) 10/14/2020 11:34 AM   GFR 46.35 (L) 10/16/2020 11:07 AM   GFRNONAA 54 (L) 06/02/2018 10:15 AM   GFRAA 62 06/02/2018 10:15 AM    BMP Latest Ref Rng & Units 10/16/2020 10/14/2020 04/17/2020  Glucose 70 - 99 mg/dL 105(H) 115(H) 95  BUN 6 - 23 mg/dL 20 21 19   Creatinine 0.40 - 1.20 mg/dL 1.15 1.21(H) 1.05  BUN/Creat Ratio 12 - 28 - - -  Sodium 135 - 145 mEq/L 139 140 140  Potassium 3.5 - 5.1 mEq/L 3.7 3.7 4.0  Chloride 96 - 112 mEq/L 101 100 102  CO2 19 - 32 mEq/L 30 30 30   Calcium 8.4 - 10.5 mg/dL 10.0 9.9 10.0    Current antihypertensive regimen:  Diltiazem 180 mg Olmesartan 5 mg   How often are you checking your Blood Pressure?         weekly but infrequently. Patient reported she is trying to do better.  Current home BP readings:  11/02/20: 129/75  11/07/20: 132/75  and 122/59  What recent interventions/DTPs have been made by any provider to improve Blood Pressure control since last CPP Visit: Patient reports no current changes.  Any recent hospitalizations or ED visits since last visit with CPP? No  What diet changes have been made to improve Blood Pressure Control?  Patient reported she is working on her diet and checking blood pressures more regularly at home.  What exercise is being done to improve your Blood Pressure Control?  Patient reported she has not been as active due to the heat.  Adherence Review: Is the patient currently on ACE/ARB medication? Yes Does the patient have >5 day gap between last estimated fill dates?  No  Diltiazem 180 mg - last filled 08/31/20 90 days Olmesartan 5 mg - last filled  10/03/20 90 days Atorvastatin 20 mg - last filled 09/13/20 90 days (takes 1/2 of 20 mg daily)

## 2020-11-27 ENCOUNTER — Other Ambulatory Visit: Payer: Self-pay | Admitting: Internal Medicine

## 2020-12-02 ENCOUNTER — Other Ambulatory Visit: Payer: Self-pay | Admitting: Cardiovascular Disease

## 2020-12-02 NOTE — Telephone Encounter (Signed)
Rx request sent to pharmacy.  

## 2020-12-18 NOTE — Progress Notes (Signed)
Cardiology Office Note   Date:  12/19/2020   ID:  Sharon Knight, DOB April 15, 1946, MRN [id number]  PCP:  Binnie Rail, MD  Cardiologist:   Skeet Latch, MD   No chief complaint on file.    History of Present Illness: Sharon Knight is a 75 y.o. female with paroxysmal atrial fibrillation, hypertension, hyperlipidemia, diabetes, and right nephrectomy who presents for follow up.  She was admitted 12/2017 with atrial fibrillation with RVR in the setting of sepsis and pneumonia.  She was started on Eliquis and her home dose of metoprolol was increased.  Echocardiogram 12/11/2017 revealed LVEF 65 to 70% with moderate LVH.  Her left atrium was not dilated.  She followed up with Almyra Deforest, PA on 12/22/17 and was back in sinus rhythm.  She had a 30-day event monitor that did not reveal recurrent atrial fibrillation.  She followed up with Dr. Curt Bears 12/30/2017 and was still in atrial fibrillation at that time.  She was started on flecainide and metoprolol was discontinued.  She continued to wear her monitor and had multiple episodes of pauses overnight.  She was seen by Dr. Curt Bears in the ED 01/06/2018 and her evening dose of metoprolol was discontinued.  Sharon Knight was dizzy and bradycardic so metoprolol was discontinued.  Her BP was poorly controlled and HCTZ was increased to '25mg'$ .  Valsartan was then added and it was switched to olmesartan due to drug shortage.  She thinks it is a little more effective with her BP.    At her last appointment she complained of weakness and memory loss. Today, she reports she has been struggling this year. In 06/2020 her mother-in-law passed away. She feels like she has very little stamina, and often becomes diaphoretic while running errands. Her left knee pain is significantly limiting her physical activity, and causes her to take a long time while climbing stairs. For a few weeks she has not been to a yoga class that she enjoyed due to her knee. 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 For her diet, she typically cooks meals with chicken, fish, occasional lunch meat or Spam, beans, and tomatoes. She avoids shellfish. Lately, she has been going to bed at the same time every night. Also, she has become more anxious about driving. She denies any palpitations, or chest pain. No lightheadedness, headaches, syncope, orthopnea, or PND. Also has no lower extremity edema.    Past Medical History:  Diagnosis Date   Arthritis    Bilateral cataracts    Dr Kathrin Penner   Exertional dyspnea 12/19/2020   Hyperlipidemia    Hypertension     Past Surgical History:  Procedure Laterality Date   COLONOSCOPY     Dr Fuller Plan   NEPHRECTOMY     right; for cyst on kidney   OVARIAN CYST REMOVAL      Current Outpatient Medications  Medication Sig Dispense Refill   acetaminophen (TYLENOL) 650 MG CR tablet Take 650 mg by mouth every 8 (eight) hours as needed for pain.     atorvastatin (LIPITOR) 20 MG tablet TAKE (1/2) TABLET DAILY. 45 tablet 8   Cholecalciferol (VITAMIN D3 PO) Take 1,000 Int'l Units/day by mouth.     Cyanocobalamin (VITAMIN B-12 PO) Take by mouth.     diltiazem (CARDIZEM CD) 180 MG 24 hr capsule Take 1 capsule (180 mg total) by mouth daily. Please keep your upcoming appointment for refills. 90 capsule 0   Docusate  Sodium (STOOL SOFTENER LAXATIVE PO) Take 3-4 capsules by mouth daily.     ELIQUIS 5 MG TABS tablet TAKE ONE TABLET BY MOUTH TWICE DAILY 60 tablet 0   flecainide (TAMBOCOR) 100 MG tablet TAKE 1 TABLET BY MOUTH TWICE DAILY. 180 tablet 2   hydrochlorothiazide (HYDRODIURIL) 25 MG tablet TAKE ONE TABLET DAILY 90 tablet 0   olmesartan (BENICAR) 5 MG tablet TAKE 1 TABLET ONCE DAILY. 90 tablet 2   No current facility-administered medications for this visit.    Allergies:   Ibuprofen    Social History:  The patient  reports that she quit smoking about 36 years ago. Her smoking use included cigarettes. She has never used  smokeless tobacco. She reports current alcohol use of about 3.0 standard drinks per week. She reports that she does not use drugs.   Family History:  The patient's family history is not on file. She was adopted.    ROS:   Please see the history of present illness. (+) Left knee pain (+) Diaphoresis (+) Shortness of breath (+) Fatigue (+) Anxiety All other systems are reviewed and negative.    PHYSICAL EXAM: VS:  BP 110/62   Pulse 69   Ht '5\' 5"'$  (1.651 m)   Wt 171 lb 9.6 oz (77.8 kg)   BMI 28.56 kg/m  , BMI Body mass index is 28.56 kg/m. GENERAL:  Well appearing HEENT: Pupils equal round and reactive, fundi not visualized, oral mucosa unremarkable NECK:  No jugular venous distention, waveform within normal limits, carotid upstroke brisk and symmetric, no bruits LUNGS:  Clear to auscultation bilaterally HEART:  RRR.  PMI not displaced or sustained,S1 and S2 within normal limits, no S3, no S4, no clicks, no rubs, II/VI systolic murmur at the LUSB ABD:  Flat, positive bowel sounds normal in frequency in pitch, no bruits, no rebound, no guarding, no midline pulsatile mass, no hepatomegaly, no splenomegaly EXT:  2 plus pulses throughout, no edema, no cyanosis no clubbing.  L knee brace SKIN:  No rashes no nodules NEURO:  Cranial nerves II through XII grossly intact, motor grossly intact throughout PSYCH:  Cognitively intact, oriented to person place and time  EKG:   12/19/2020: Sinus rhythm. Rate 69 bpm. Prior inferior infarct. 11/14/19: Sinus bradcyardia.  Rate 58 bpm.  First degree AV block.    ETT 01/25/18: 4.6 METS on Bruce protocol.  No ischemia.  Echocardiogram 12/11/2017 Study Conclusions - Left ventricle: The cavity size was normal. Wall thickness was   increased in a pattern of moderate LVH. Systolic function was   vigorous. The estimated ejection fraction was in the range of 65%   to 70%. Wall motion was normal; there were no regional wall   motion abnormalities. The  study was not technically sufficient to   allow evaluation of LV diastolic dysfunction due to atrial   fibrillation. - Aortic valve: Valve area (VTI): 2.14 cm^2. Valve area (Vmax):   2.43 cm^2. Valve area (Vmean): 2.33 cm^2. - Mitral valve: Valve area by continuity equation (using LVOT   flow): 1.61 cm^2. - Atrial septum: No defect or patent foramen ovale was identified  30 Day Event Monitor 12/23/17:   Quality: Fair.  Baseline artifact. Predominant rhythm: sinus rhythm Average heart rate: 68 bpm Max heart rate: 174 bpm Min heart rate: 38 bpm 199 pauses >3 seconds Pauses noted up to 6 seconds. All occurred over night or early morning. PVCs Atrial flutter 16% (71% controlled, <1% slow, 29% rapid)   Recent Labs: 04/17/2020:  Hemoglobin 14.5; Platelets 337.0; TSH 2.62 10/16/2020: ALT 16; BUN 20; Creatinine, Ser 1.15; Potassium 3.7; Sodium 139    Lipid Panel    Component Value Date/Time   CHOL 176 04/17/2020 1223   CHOL 174 12/05/2012 1538   TRIG 104.0 04/17/2020 1223   TRIG 109 12/05/2012 1538   HDL 70.20 04/17/2020 1223   HDL 60 12/05/2012 1538   CHOLHDL 3 04/17/2020 1223   VLDL 20.8 04/17/2020 1223   LDLCALC 85 04/17/2020 1223   LDLCALC 92 12/05/2012 1538     Wt Readings from Last 3 Encounters:  12/19/20 171 lb 9.6 oz (77.8 kg)  10/30/20 168 lb 12.8 oz (76.6 kg)  10/16/20 170 lb (77.1 kg)      ASSESSMENT AND PLAN: Essential hypertension BP is well-controlled on diltiazem and olmesartan.  Hyperlipidemia Continue atorvastatin.   Paroxysmal atrial fibrillation (HCC) She is maintaining sinus rhythm on diltiazem and flecainide.  Continue Eliquis.   Exertional dyspnea She has been very fatigued and short of breath when she tries to exert herself.  She breaks out into a sweat.  We will check a TSH and get a Lexiscan Myoview.  She is unable to walk on a treadmill.     Current medicines are reviewed at length with the patient today.  The patient does not have  concerns regarding medicines.  The following changes have been made:   Labs/ tests ordered today include:   Orders Placed This Encounter  Procedures   TSH   MYOCARDIAL PERFUSION IMAGING   EKG 12-Lead      Disposition:    FU with APP in 6 months. FU with Cheri Ayotte C. Oval Linsey, MD, [facility] in 1 year.  I,Mathew Stumpf,acting as a Education administrator for Skeet Latch, MD.,have documented all relevant documentation on the behalf of Skeet Latch, MD,as directed by  Skeet Latch, MD while in the presence of Skeet Latch, MD.  I, [place] Oval Linsey, MD have reviewed all documentation for this visit.  The documentation of the exam, diagnosis, procedures, and orders on 12/19/2020 are all accurate and complete.   Signed, Mekala Winger C. Oval Linsey, MD, [facility]  12/19/2020 12:57 PM    [facility]

## 2020-12-19 ENCOUNTER — Encounter (HOSPITAL_BASED_OUTPATIENT_CLINIC_OR_DEPARTMENT_OTHER): Payer: Self-pay | Admitting: Cardiovascular Disease

## 2020-12-19 ENCOUNTER — Other Ambulatory Visit: Payer: Self-pay

## 2020-12-19 ENCOUNTER — Ambulatory Visit (HOSPITAL_BASED_OUTPATIENT_CLINIC_OR_DEPARTMENT_OTHER): Payer: Medicare Other | Admitting: Cardiovascular Disease

## 2020-12-19 DIAGNOSIS — R0609 Other forms of dyspnea: Secondary | ICD-10-CM

## 2020-12-19 DIAGNOSIS — I480 Paroxysmal atrial fibrillation: Secondary | ICD-10-CM | POA: Diagnosis not present

## 2020-12-19 DIAGNOSIS — R0600 Dyspnea, unspecified: Secondary | ICD-10-CM | POA: Diagnosis not present

## 2020-12-19 DIAGNOSIS — E7800 Pure hypercholesterolemia, unspecified: Secondary | ICD-10-CM | POA: Diagnosis not present

## 2020-12-19 DIAGNOSIS — I10 Essential (primary) hypertension: Secondary | ICD-10-CM | POA: Diagnosis not present

## 2020-12-19 HISTORY — DX: Other forms of dyspnea: R06.09

## 2020-12-19 HISTORY — DX: Dyspnea, unspecified: R06.00

## 2020-12-19 NOTE — Assessment & Plan Note (Signed)
She is maintaining sinus rhythm on diltiazem and flecainide.  Continue Eliquis.

## 2020-12-19 NOTE — Assessment & Plan Note (Signed)
BP is well-controlled on diltiazem and olmesartan.

## 2020-12-19 NOTE — Assessment & Plan Note (Signed)
She has been very fatigued and short of breath when she tries to exert herself.  She breaks out into a sweat.  We will check a TSH and get a Lexiscan Myoview.  She is unable to walk on a treadmill.

## 2020-12-19 NOTE — Patient Instructions (Addendum)
Medication Instructions:  Your physician recommends that you continue on your current medications as directed. Please refer to the Current Medication list given to you today.   *If you need a refill on your cardiac medications before your next appointment, please call your pharmacy*  Lab Work: TSH WHEN YOU GO FOR Jany[facility] Contes   Testing/Procedures: Your physician has requested that you have a lexiscan myoview. For further information please visit [email or web address]. Please follow instruction sheet, as given.  Follow-Up: At [facility], you and your health needs are our priority.  As part of our continuing mission to provide you with exceptional heart care, we have created designated Provider Care Teams.  These Care Teams include your primary Cardiologist (physician) and Advanced Practice Providers (APPs -  Physician Assistants and Nurse Practitioners) who all work together to provide you with the care you need, when you need it.  We recommend signing up for the patient portal called "MyChart".  Sign up information is provided on this After Visit Summary.  MyChart is used to connect with patients for Virtual Visits (Telemedicine).  Patients are able to view lab/test results, encounter notes, upcoming appointments, etc.  Non-urgent messages can be sent to your provider as well.   To learn more about what you can do with MyChart, go to [email or web address].    Your next appointment:   12 month(s)  The format for your next appointment:   In Person  Provider:   Skeet Latch, MD  Your physician recommends that you schedule a follow-up appointment in:6 MONTHS WITH Overton Mam NP

## 2020-12-19 NOTE — Assessment & Plan Note (Signed)
Continue atorvastatin

## 2020-12-22 ENCOUNTER — Other Ambulatory Visit (HOSPITAL_BASED_OUTPATIENT_CLINIC_OR_DEPARTMENT_OTHER): Payer: Self-pay | Admitting: Cardiovascular Disease

## 2020-12-23 NOTE — Telephone Encounter (Signed)
Rx request sent to pharmacy.  

## 2020-12-27 ENCOUNTER — Telehealth (HOSPITAL_COMMUNITY): Payer: Self-pay | Admitting: *Deleted

## 2020-12-27 NOTE — Telephone Encounter (Signed)
Close encounter 

## 2020-12-31 ENCOUNTER — Other Ambulatory Visit: Payer: Self-pay | Admitting: Internal Medicine

## 2020-12-31 ENCOUNTER — Ambulatory Visit (HOSPITAL_COMMUNITY)
Admission: RE | Admit: 2020-12-31 | Discharge: 2020-12-31 | Disposition: A | Discharge status: Home / Self Care | Payer: Medicare Other | Source: Ambulatory Visit | Attending: Cardiology | Admitting: Cardiology

## 2020-12-31 ENCOUNTER — Other Ambulatory Visit: Payer: Self-pay

## 2020-12-31 DIAGNOSIS — I480 Paroxysmal atrial fibrillation: Secondary | ICD-10-CM | POA: Insufficient documentation

## 2020-12-31 DIAGNOSIS — R0600 Dyspnea, unspecified: Secondary | ICD-10-CM | POA: Insufficient documentation

## 2020-12-31 DIAGNOSIS — R0609 Other forms of dyspnea: Secondary | ICD-10-CM

## 2020-12-31 DIAGNOSIS — E7800 Pure hypercholesterolemia, unspecified: Secondary | ICD-10-CM | POA: Insufficient documentation

## 2020-12-31 DIAGNOSIS — I10 Essential (primary) hypertension: Secondary | ICD-10-CM | POA: Diagnosis not present

## 2020-12-31 LAB — MYOCARDIAL PERFUSION IMAGING
LV dias vol: 71 mL (ref 46–106)
LV sys vol: 16 mL
Nuc Stress EF: 77 %
Peak HR: 69 {beats}/min
Rest HR: 61 {beats}/min
Rest Nuclear Isotope Dose: 11 mCi
SDS: 1
SRS: 0
SSS: 1
ST Depression (mm): 0 mm
Stress Nuclear Isotope Dose: 32.7 mCi
TID: 0.95

## 2020-12-31 MED ORDER — REGADENOSON 0.4 MG/5ML IV SOLN
0.4000 mg | Freq: Once | INTRAVENOUS | Status: AC
  Administered 2020-12-31: 0.4 mg via INTRAVENOUS

## 2020-12-31 MED ORDER — TECHNETIUM TC 99M TETROFOSMIN IV KIT
11.0000 | PACK | Freq: Once | INTRAVENOUS | Status: AC | PRN
  Administered 2020-12-31: 11 via INTRAVENOUS
  Filled 2020-12-31: qty 11

## 2020-12-31 MED ORDER — TECHNETIUM TC 99M TETROFOSMIN IV KIT
32.7000 | PACK | Freq: Once | INTRAVENOUS | Status: AC | PRN
  Administered 2020-12-31: 32.7 via INTRAVENOUS
  Filled 2020-12-31: qty 33

## 2021-01-08 ENCOUNTER — Other Ambulatory Visit (HOSPITAL_BASED_OUTPATIENT_CLINIC_OR_DEPARTMENT_OTHER): Payer: Self-pay | Admitting: Cardiovascular Disease

## 2021-01-13 NOTE — Progress Notes (Signed)
   I, Wendy Poet, LAT, ATC, am serving as scribe for Dr. [name].  Sharon Knight is a 75 y.o. female who presents to [place] [facility] today for f/u of L knee pain thought to be due to DJD and possibly gout.  She was last seen by Dr. Georgina Snell on 10/14/20 and was advised that PT may help by strengthening her L LE muscles but pt declined referral.  She was prescribed allopurinol due to an elevated uric acid level of 7.3 mg/dL.  She had a prior L knee steroid injection on 09/16/20.  Today, pt reports L knee has been doing much better and she has been using a cane when walking. Pt notes she never took the allopurinol and instead has been taking tart cherry extract instead per recommendation of PCP and has been adjusting her diet.   She notes some exertional intolerance.  Her cardiologist Dr. Oval Linsey worked this up with a nuclear medicine stress test that was normal.  Diagnostic imaging: L knee XR- 09/16/20  Pertinent review of systems: No fevers or chills  Relevant historical information: Diabetes   Exam:  BP 134/80   Pulse 63   Ht '5\' 5"'$  (1.651 m)   Wt 174 lb (78.9 kg)   SpO2 98%   BMI 28.96 kg/m  General: Well Developed, well nourished, and in no acute distress.   MSK: Left knee normal motion.  Nontender.  Mild antalgic gait.    Lab and Radiology Results EXAM: LEFT KNEE 3 VIEWS   COMPARISON:  None.   FINDINGS: Frontal, lateral, and sunrise patellar images were obtained. No fracture or dislocation. No joint effusion. There is severe narrowing of the patellofemoral joint. There is extensive chondrocalcinosis. There is spurring in the posterior patella and lateral compartments. No erosive change. There are foci of arterial vascular calcification.   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 There is extensive chondrocalcinosis which may be seen with osteoarthritis or calcium pyrophosphate deposition disease. No  acute fracture, dislocation, or joint effusion. Foci of atherosclerotic arterial vascular calcification noted.     Electronically Signed   By: Lowella Grip III M.D.   On: 09/18/2020 14:34 I, [name], personally (independently) visualized and performed the interpretation of the images attached in this note.      Assessment and Plan: 75 y.o. female with knee pain improved with time, tired cherry juice, and prior steroid injection in May.  She is feeling okay currently.  Plan for a bit of watchful waiting and continued home exercise program.  Recommended advance activity as tolerated.  Regular exercise should help improve her exertional tolerance.  Recommend using an exercise bike as it should be helpful for her knees and should help improve her exercise tolerance.  Recheck with me as needed.    Discussed warning signs or symptoms. Please see discharge instructions. Patient expresses understanding.   The above documentation has been reviewed and is accurate and complete [name], M.D.

## 2021-01-14 ENCOUNTER — Other Ambulatory Visit: Payer: Self-pay

## 2021-01-14 ENCOUNTER — Ambulatory Visit: Payer: Medicare Other | Admitting: Family Medicine

## 2021-01-14 VITALS — BP 134/80 | HR 63 | Ht 65.0 in | Wt 174.0 lb

## 2021-01-14 DIAGNOSIS — M25562 Pain in left knee: Secondary | ICD-10-CM | POA: Diagnosis not present

## 2021-01-14 DIAGNOSIS — G8929 Other chronic pain: Secondary | ICD-10-CM

## 2021-01-14 DIAGNOSIS — R5383 Other fatigue: Secondary | ICD-10-CM | POA: Diagnosis not present

## 2021-01-14 NOTE — Patient Instructions (Addendum)
Thank you for coming in today.   Continue current treatment for the knee.   I can do more if needed.   We can repeat the cortisone injection if we needed to.

## 2021-01-24 ENCOUNTER — Other Ambulatory Visit: Payer: Self-pay | Admitting: Cardiovascular Disease

## 2021-01-24 NOTE — Telephone Encounter (Signed)
Rx(s) sent to pharmacy electronically.  

## 2021-01-29 ENCOUNTER — Other Ambulatory Visit: Payer: Self-pay | Admitting: Internal Medicine

## 2021-02-05 ENCOUNTER — Ambulatory Visit (INDEPENDENT_AMBULATORY_CARE_PROVIDER_SITE_OTHER): Payer: Medicare Other

## 2021-02-05 ENCOUNTER — Other Ambulatory Visit: Payer: Self-pay

## 2021-02-05 DIAGNOSIS — Z23 Encounter for immunization: Secondary | ICD-10-CM

## 2021-02-05 NOTE — Progress Notes (Signed)
Pt given High Dose flu vacc w/o any complications.

## 2021-02-26 ENCOUNTER — Other Ambulatory Visit: Payer: Self-pay | Admitting: Internal Medicine

## 2021-03-04 ENCOUNTER — Other Ambulatory Visit: Payer: Self-pay

## 2021-03-04 ENCOUNTER — Ambulatory Visit (INDEPENDENT_AMBULATORY_CARE_PROVIDER_SITE_OTHER): Payer: Medicare Other | Admitting: Pharmacist

## 2021-03-04 DIAGNOSIS — E7849 Other hyperlipidemia: Secondary | ICD-10-CM | POA: Diagnosis not present

## 2021-03-04 DIAGNOSIS — I480 Paroxysmal atrial fibrillation: Secondary | ICD-10-CM | POA: Diagnosis not present

## 2021-03-04 DIAGNOSIS — M25562 Pain in left knee: Secondary | ICD-10-CM

## 2021-03-04 DIAGNOSIS — I10 Essential (primary) hypertension: Secondary | ICD-10-CM | POA: Diagnosis not present

## 2021-03-04 DIAGNOSIS — N1831 Chronic kidney disease, stage 3a: Secondary | ICD-10-CM

## 2021-03-04 DIAGNOSIS — E119 Type 2 diabetes mellitus without complications: Secondary | ICD-10-CM

## 2021-03-04 NOTE — Patient Instructions (Addendum)
Visit Information  Phone number for Pharmacist: [phone]   Goals Addressed             This Visit's Progress    Manage My Medicine       Timeframe:  Long-Range Goal Priority:  Medium Start Date:   09/05/20                          Expected End Date:   09/05/21                    Follow Up Date 04/02/21   - call for medicine refill 2 or 3 days before it runs out - call if I am sick and can't take my medicine - keep a list of all the medicines I take; vitamins and herbals too - use a pillbox to sort medicine  -work on diet/exercise to improve diabetes and gout -Try Voltaren gel and glucosamine-chondroitin for arthritis pain   Why is this important?   These steps will help you keep on track with your medicines.   Notes:         Care Plan : CCM Pharmacy Care Plan  Updates made by Charlton Haws, RPH since 03/04/2021 12:00 AM     Problem: Hypertension, Hyperlipidemia, Diabetes, Atrial Fibrillation, Chronic Kidney Disease, and Osteoarthritis   Priority: High     Long-Range Goal: Disease management   Start Date: 09/05/2020  Expected End Date: 03/04/2022  This Visit's Progress: On track  Recent Progress: On track  Priority: High  Note:   Current Barriers:  Unable to independently monitor therapeutic efficacy  Pharmacist Clinical Goal(s):  Patient will achieve adherence to monitoring guidelines and medication adherence to achieve therapeutic efficacy through collaboration with PharmD and provider.   Interventions: 1:1 collaboration with Binnie Rail, MD regarding development and update of comprehensive plan of care as evidenced by provider attestation and co-signature Inter-disciplinary care team collaboration (see longitudinal plan of care) Comprehensive medication review performed; medication list updated in electronic medical record  Hypertension / CKD stage 3a    BP goal < 130/80 Patient checks BP at home 3-5x per week Patient home BP readings are  ranging: 120s-130s/60s-70s Hx of nephrectomy - pt has 1 kidney   Patient has failed these meds in past: metoprolol Patient is currently controlled on the following medications:  Diltiazem 180 mg daily HCTZ 25 mg daily,  Olmesartan 5 mg daily   We discussed: BP goals and benefits of medications for prevention of heart attack / stroke / kidney damage; counseled on importance of hydration and avoidance of nephrotoxins (NSAIDs) -pt reports she received a test kit from [facility] that will test eGFR, UACR and microalbumin, she has not used it yet; advised PCP can check these labs just as easily at next office visit    Plan: Continue current medications Recommend UACR, microlbumin at next PCP visit 04/17/21   AFIB    Patient is currently rate controlled. CHADSVASC=5   Patient has failed these meds in past: n/a Patient is currently controlled on the following medications:  Diltiazem 180 mg daily,  Flecainide 100 mg BID Eliquis 5 mg BID   We discussed:  Pt reports Afib started during hospitalization for Pneumonia in 2019. She does not feel when she goes into Afib. She feels current meds are working well for her.    Plan: Continue current medications   Diabetes    A1c goal < 7% Diet-controlled   We  discussed: A1c is above goal; pt was advised per PCP to work on lifestyle modifications, if A1c is still > 7% next month plan to start medication   Plan: Continue control with diet and exercise    Hyperlipidemia    LDL goal < 100 Patient is currently controlled on the following medications:  Atorvastatin 10 mg (1/2 of 20 mg) daily   We discussed:  LDL is at goal; pt endorses compliance with statin   Plan: Continue current medications    Osteoarthritis  Patient is currently uncontrolled on the following medications:  Tylenol 650 mg Tart Cherry Voltaren Gel - not using  We discussed:  Pt reports knee pain limits movements; PT sometimes helps and sometimes makes pain worse; she has  not tried Voltaren gel or glucosamine due to fear of drug interactions/side effects; discussed Voltaren gel is not absorbed in high amounts into the blood so side effects are limited; discussed relative safety of glucosamine as well  Plan: Try Glucosamine-chondroitin and Voltaren gel   Patient Goals/Self-Care Activities Patient will:  - take medications as prescribed -focus on medication adherence by pill box -check blood pressure daily -work on diet/exercise to improve diabetes and gout -Try Voltaren gel and glucosamine-chondroitin for arthritis pain      The patient verbalized understanding of instructions, educational materials, and care plan provided today and declined offer to receive copy of patient instructions, educational materials, and care plan.  Telephone follow up appointment with pharmacy team member scheduled for: 6 months  Charlene Brooke, PharmD, [place], CPP Clinical Pharmacist [facility] [phone]

## 2021-03-04 NOTE — Progress Notes (Signed)
Chronic Care Management Pharmacy Note  03/04/2021 Name:  Sharon Knight MRN:  [id number] DOB:  Mar 26, 1946  Summary: -Pt is worried about her kidneys; she received a [employer] home test that tests eGFR, UACR and microalbumin but has not used it yet; last UACR in chart is from 10/2007 (WNL) -Pt is not using Voltaren Gel or Glucosamine due to fear of drug interactions/side effects; counseled on relative safety of these products  Recommendations/Changes made from today's visit: -Recommend UACR at PCP visit 04/17/21 -Advised to try Voltaren gel, glucosamine for arthritis pain   Subjective: Sharon Knight is an 75 y.o. year old female who is a primary patient of Burns, Claudina Lick, MD.  The CCM team was consulted for assistance with disease management and care coordination needs.    Engaged with patient by telephone for follow up visit in response to provider referral for pharmacy case management and/or care coordination services.   Consent to Services:  The patient was given information about Chronic Care Management services, agreed to services, and gave verbal consent prior to initiation of services.  Please see initial visit note for detailed documentation.   Patient Care Team: Binnie Rail, MD as PCP - General (Internal Medicine) Skeet Latch, MD as PCP - Cardiology (Cardiology) Constance Haw, MD as PCP - Electrophysiology (Cardiology) Charlton Haws, [facility] as Pharmacist (Pharmacist) Jalene Mullet, MD as Consulting Physician (Ophthalmology) Opthamology, [facility] (Ophthalmology)  Recent office visits: 10/16/20 Dr [name] OV: CPE; she did not want to take allopurinol, will work on diet; A1c 7.1, advised lifestyle changes, if still elevated 6 months will adjust meds  09/16/20 Dr [name] OV: acute knee pain; referred to sports med  Recent consult visits: 01/14/21 Dr Georgina Snell (sports med): f/u knee pain; continue tx  12/19/20 Dr Oval Linsey (cardiology): f/u Afib; c/o fatigue, SOB w/  exertion; ordered TSH and Lexiscan (results - low risk study)  10/14/20 Dr Georgina Snell (sports med): f/u knee pain, gout; uric acid 7.3; start allopurinol 100 mg daily  09/16/20 Dr Georgina Snell (sports med): acute knee pain - arthritis vs pseudogout; steroid injection given; rec'd voltaren gel  Hospital visits: None in previous 6 months  Objective:  Lab Results  Component Value Date   CREATININE 1.15 10/16/2020   BUN 20 10/16/2020   GFR 46.35 (L) 10/16/2020   GFRNONAA 54 (L) 06/02/2018   GFRAA 62 06/02/2018   NA 139 10/16/2020   K 3.7 10/16/2020   CALCIUM 10.0 10/16/2020   CO2 30 10/16/2020   GLUCOSE 105 (H) 10/16/2020    Lab Results  Component Value Date/Time   HGBA1C 7.1 (H) 10/16/2020 11:07 AM   HGBA1C 6.5 04/17/2020 12:23 PM   GFR 46.35 (L) 10/16/2020 11:07 AM   GFR 43.61 (L) 10/14/2020 11:34 AM   MICROALBUR 0.2 11/01/2007 08:29 AM    Last diabetic Eye exam:  Lab Results  Component Value Date/Time   HMDIABEYEEXA No Retinopathy 12/04/2019 12:00 AM    Last diabetic Foot exam: No results found for: HMDIABFOOTEX   Lab Results  Component Value Date   CHOL 176 04/17/2020   HDL 70.20 04/17/2020   LDLCALC 85 04/17/2020   TRIG 104.0 04/17/2020   CHOLHDL 3 04/17/2020    Hepatic Function Latest Ref Rng & Units 10/16/2020 04/17/2020 09/14/2019  Total Protein 6.0 - 8.3 g/dL 7.8 7.9 7.7  Albumin 3.5 - 5.2 g/dL 4.4 4.5 4.6  AST 0 - 37 U/L '18 22 23  ' ALT 0 - 35 U/L '16 21 22  ' Alk Phosphatase  39 - 117 U/L 95 86 90  Total Bilirubin 0.2 - 1.2 mg/dL 0.5 0.6 0.5  Bilirubin, Direct 0.0 - 0.3 mg/dL - - -    Lab Results  Component Value Date/Time   TSH 2.62 04/17/2020 12:23 PM   TSH 3.63 03/17/2019 11:32 AM    CBC Latest Ref Rng & Units 04/17/2020 09/14/2019 03/17/2019  WBC 4.0 - 10.5 K/uL 9.0 8.7 8.5  Hemoglobin 12.0 - 15.0 g/dL 14.5 14.4 14.4  Hematocrit 36.0 - 46.0 % 43.6 42.5 41.9  Platelets 150.0 - 400.0 K/uL 337.0 320.0 288.0    Lab Results  Component Value Date/Time   VD25OH  34.51 04/17/2020 12:23 PM   VD25OH 53.46 10/22/2014 10:21 AM    Clinical ASCVD: No  The 10-year ASCVD risk score (Arnett DK, et al., 2019) is: 43.3%   Values used to calculate the score:     Age: 21 years     Sex: Female     Is Non-Hispanic African American: No     Diabetic: Yes     Tobacco smoker: No     Systolic Blood Pressure: 628 mmHg     Is BP treated: Yes     HDL Cholesterol: 70.2 mg/dL     Total Cholesterol: 176 mg/dL    Depression screen [facility] 2/9 10/30/2020 10/16/2020 09/14/2019  Decreased Interest 0 0 0  Down, Depressed, Hopeless 0 0 1  PHQ - 2 Score 0 0 1  Altered sleeping - - 1  Tired, decreased energy - - 0  Change in appetite - - 0  Feeling bad or failure about yourself  - - 0  Trouble concentrating - - 0  Moving slowly or fidgety/restless - - 0  Suicidal thoughts - - 0  PHQ-9 Score - - 2  Difficult doing work/chores - - Somewhat difficult     CHA2DS2-VASc Score = 5  The patient's score is based upon: CHF History: 0 HTN History: 1 Diabetes History: 1 Stroke History: 0 Vascular Disease History: 0 Age Score: 2 Gender Score: 1      Social History   Tobacco Use  Smoking Status Former   Types: Cigarettes   Quit date: 08/27/1984   Years since quitting: 36.5  Smokeless Tobacco Never  Tobacco Comments   smoked age [age], up to 1.5-2 ppd   BP Readings from Last 3 Encounters:  01/14/21 134/80  12/19/20 110/62  10/30/20 118/60   Pulse Readings from Last 3 Encounters:  01/14/21 63  12/19/20 69  10/30/20 62   Wt Readings from Last 3 Encounters:  01/14/21 174 lb (78.9 kg)  12/31/20 171 lb (77.6 kg)  12/19/20 171 lb 9.6 oz (77.8 kg)   BMI Readings from Last 3 Encounters:  01/14/21 28.96 kg/m  12/31/20 28.46 kg/m  12/19/20 28.56 kg/m    Assessment/Interventions: Review of patient past medical history, allergies, medications, health status, including review of consultants reports, laboratory and other test data, was performed as part of  comprehensive evaluation and provision of chronic care management services.   SDOH:  (Social Determinants of Health) assessments and interventions performed: Yes  SDOH Screenings   Alcohol Screen: Low Risk    Last Alcohol Screening Score (AUDIT): 0  Depression (PHQ2-9): Low Risk    PHQ-2 Score: 0  Financial Resource Strain: Low Risk    Difficulty of Paying Living Expenses: Not hard at all  Food Insecurity: No Food Insecurity   Worried About Charity fundraiser in the Last Year: Never true   Ran  Out of Food in the Last Year: Never true  Housing: Low Risk    Last Housing Risk Score: 0  Physical Activity: Inactive   Days of Exercise per Week: 0 days   Minutes of Exercise per Session: 0 min  Social Connections: Socially Integrated   Frequency of Communication with Friends and Family: More than three times a week   Frequency of Social Gatherings with Friends and Family: More than three times a week   Attends Religious Services: More than 4 times per year   Active Member of [employer] or Organizations: No   Attends Music therapist: More than 4 times per year   Marital Status: Married  Stress: No Stress Concern Present   Feeling of Stress : Not at all  Tobacco Use: Medium Risk   Smoking Tobacco Use: Former   Smokeless Tobacco Use: Never   Passive Exposure: Not on Pensions consultant Needs: No Transportation Needs   Lack of Transportation (Medical): No   Lack of Transportation (Non-Medical): No    CCM Care Plan  Allergies  Allergen Reactions   Ibuprofen Other (See Comments)    Dizziness "made me woozy"    Medications Reviewed Today     Reviewed by Charlton Haws, [facility] (Pharmacist) on 03/04/21 at 1016  Med List Status: <None>   Medication Order Taking? Sig Documenting Provider Last Dose Status Informant  acetaminophen (TYLENOL) 650 MG CR tablet [id number] Yes Take 650 mg by mouth every 8 (eight) hours as needed for pain. [provider] Taking Active    atorvastatin (LIPITOR) 20 MG tablet [id number] Yes TAKE (1/2) TABLET DAILY. Skeet Latch, MD Taking Active   Cholecalciferol (VITAMIN D3 PO) [id number] Yes Take 1,000 Int'l Units/day by mouth. [provider] Taking Active   Cyanocobalamin (VITAMIN B-12 PO) [id number] Yes Take by mouth. [provider] Taking Active   diclofenac Sodium (VOLTAREN) 1 % GEL [id number] Yes Apply topically 4 (four) times daily. [provider]  Active   diltiazem (CARDIZEM CD) 180 MG 24 hr capsule [id number] Yes Take 1 capsule (180 mg total) by mouth daily. Please keep your upcoming appointment for refills. Skeet Latch, MD Taking Active   Docusate Sodium (STOOL SOFTENER LAXATIVE PO) [id number] Yes Take 3-4 capsules by mouth daily. [provider] Taking Active   ELIQUIS 5 MG TABS tablet [id number] Yes TAKE ONE TABLET BY MOUTH TWICE DAILY Burns, Claudina Lick, MD Taking Active   flecainide (TAMBOCOR) 100 MG tablet [id number] Yes TAKE 1 TABLET BY MOUTH TWICE DAILY. Skeet Latch, MD Taking Active   glucosamine-chondroitin 500-400 MG tablet [id number] Yes Take 1 tablet by mouth 3 (three) times daily. [provider]  Active   hydrochlorothiazide (HYDRODIURIL) 25 MG tablet [id number] Yes TAKE ONE TABLET DAILY Skeet Latch, MD Taking Active   Misc Natural Products (TART CHERRY ADVANCED PO) [id number] Yes Take by mouth. [provider] Taking Active   olmesartan (BENICAR) 5 MG tablet [id number] Yes TAKE 1 TABLET ONCE DAILY. 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 03/16/2019   Diabetes mellitus without complication ([place]) 26/41/5830   Bilateral low back pain 03/17/2018   Gout 12/21/2017    Lobar  pneumonia ([place]) 12/10/2017   Paroxysmal atrial fibrillation ([place]) 12/10/2017   History of colonic polyps 10/22/2014   History of nephrectomy, unilateral 11/13/2012   Vitamin D deficiency 10/31/2012   ECZEMA 03/03/2010   Benign neoplasm of colon 08/02/2007   Hyperlipidemia 08/02/2007   Essential hypertension 08/02/2007   Osteopenia 08/02/2007    Immunization History  Administered Date(s) Administered   Fluad Quad(high Dose 65+) 01/21/2019, 02/01/2020, 02/05/2021   Influenza Split 02/18/2011, 02/18/2012   Influenza Whole 03/02/2007, 02/03/2008, 02/08/2009, 02/25/2010   Influenza, High Dose Seasonal PF 02/14/2013, 02/05/2016, 02/03/2017, 02/15/2018   Influenza,inj,Quad PF,6+ Mos 02/20/2014, 02/11/2015   Influenza-Unspecified 02/02/2016   PFIZER(Purple Top)SARS-COV-2 Vaccination 07/15/2019, 08/09/2019, 04/04/2020, 11/21/2020   Pneumococcal Conjugate-13 02/12/2015   Pneumococcal Polysaccharide-23 02/19/2017   Td 03/03/2010   Zoster Recombinat (Shingrix) 02/28/2020, 07/16/2020   Zoster, Live 11/02/2012    Conditions to be addressed/monitored:  Hypertension, Hyperlipidemia, Diabetes, Atrial Fibrillation, Chronic Kidney Disease, and Osteoarthritis  Care Plan : [employer]  Updates made by Charlton Haws, [place] since 03/04/2021 12:00 AM     Problem: Hypertension, Hyperlipidemia, Diabetes, Atrial Fibrillation, Chronic Kidney Disease, and Osteoarthritis   Priority: High     Long-Range Goal: Disease management   Start Date: 09/05/2020  Expected End Date: 03/04/2022  This Visit's Progress: On track  Recent Progress: On track  Priority: High  Note:   Current Barriers:  Unable to independently monitor therapeutic efficacy  Pharmacist Clinical Goal(s):  Patient will achieve adherence to monitoring guidelines and medication adherence to achieve therapeutic efficacy through collaboration with PharmD and provider.   Interventions: 1:1 collaboration with Binnie Rail, MD regarding development and update of comprehensive plan of care as evidenced by provider attestation and co-signature Inter-disciplinary care team collaboration (see longitudinal plan of care) Comprehensive medication review performed; medication list updated in electronic medical record  Hypertension / CKD stage 3a    BP goal < 130/80 Patient checks BP at home 3-5x per week Patient home BP readings are ranging: 120s-130s/60s-70s Hx of nephrectomy - pt has 1 kidney   Patient has failed these meds in past: metoprolol Patient is currently controlled on the following medications:  Diltiazem 180 mg daily HCTZ 25 mg daily,  Olmesartan 5 mg daily   We discussed: BP goals and benefits of medications for prevention of heart attack / stroke / kidney damage; counseled on importance of hydration and avoidance of nephrotoxins (NSAIDs) -pt reports she received a test kit from [facility] that will test eGFR, UACR and microalbumin, she has not used it yet; advised PCP can check these labs just as easily at next office visit    Plan: Continue current medications Recommend UACR, microlbumin at next PCP visit 04/17/21   AFIB    Patient is currently rate controlled. CHADSVASC=5   Patient has failed these meds in past: n/a Patient is currently controlled on the following medications:  Diltiazem 180 mg daily,  Flecainide 100 mg BID Eliquis 5 mg BID   We discussed:  Pt reports Afib started during hospitalization for Pneumonia in 2019. She does not feel when she goes into Afib. She feels current meds are working well for her.    Plan: Continue current medications   Diabetes    A1c goal < 7% Diet-controlled   We discussed: A1c is above goal; pt was advised per PCP to work on lifestyle modifications, if A1c is still > 7% next month plan to start medication   Plan: Continue control with diet and exercise  Hyperlipidemia    LDL goal < 100 Patient is currently controlled on the following  medications:  Atorvastatin 10 mg (1/2 of 20 mg) daily   We discussed:  LDL is at goal; pt endorses compliance with statin   Plan: Continue current medications    Osteoarthritis  Patient is currently uncontrolled on the following medications:  Tylenol 650 mg Tart [place] Voltaren Gel - not using  We discussed:  Pt reports knee pain limits movements; PT sometimes helps and sometimes makes pain worse; she has not tried Voltaren gel or glucosamine due to fear of drug interactions/side effects; discussed Voltaren gel is not absorbed in high amounts into the blood so side effects are limited; discussed relative safety of glucosamine as well  Plan: Try Glucosamine-chondroitin and Voltaren gel   Patient Goals/Self-Care Activities Patient will:  - take medications as prescribed -focus on medication adherence by pill box -check blood pressure daily -work on diet/exercise to improve diabetes and gout -Try Voltaren gel and glucosamine-chondroitin for arthritis pain      Compliance/Adherence/Medication fill history: Care Gaps: DEXA scan (due 07/02/17) Foot exam (due 03/16/20) Eye exam (due 12/03/20)  Star-Rating Drugs: Olmesartan - LF 12/31/20 x 90 ds Atorvastatin - LF 12/10/20 x 90 ds  Medication Assistance: None required.  Patient affirms current coverage meets needs.  Patient's preferred pharmacy is:  [facility], [place] [address] Phone: [phone] Fax: [phone]  Uses pill box? Yes Pt endorses 100% compliance  We discussed: Current pharmacy is preferred with insurance plan and patient is satisfied with pharmacy services Patient decided to: Continue current medication management strategy  Care Plan and Follow Up Patient Decision:  Patient agrees to Care Plan and Follow-up.  Plan: Telephone follow up appointment with care management team member scheduled for:  6 months  Charlene Brooke, PharmD, [place], CPP Clinical Pharmacist [facility] [phone]

## 2021-03-05 ENCOUNTER — Other Ambulatory Visit: Payer: Self-pay | Admitting: Cardiovascular Disease

## 2021-03-06 ENCOUNTER — Other Ambulatory Visit: Payer: Self-pay

## 2021-03-06 ENCOUNTER — Ambulatory Visit
Admission: RE | Admit: 2021-03-06 | Discharge: 2021-03-06 | Disposition: A | Discharge status: Home / Self Care | Payer: Medicare Other | Source: Ambulatory Visit | Attending: Internal Medicine | Admitting: Internal Medicine

## 2021-03-06 DIAGNOSIS — R922 Inconclusive mammogram: Secondary | ICD-10-CM | POA: Diagnosis not present

## 2021-03-06 DIAGNOSIS — R921 Mammographic calcification found on diagnostic imaging of breast: Secondary | ICD-10-CM

## 2021-03-10 ENCOUNTER — Other Ambulatory Visit: Payer: Self-pay | Admitting: Cardiovascular Disease

## 2021-03-10 NOTE — Telephone Encounter (Signed)
Rx(s) sent to pharmacy electronically.  

## 2021-03-28 ENCOUNTER — Other Ambulatory Visit: Payer: Self-pay | Admitting: Internal Medicine

## 2021-04-03 DIAGNOSIS — H16042 Marginal corneal ulcer, left eye: Secondary | ICD-10-CM | POA: Diagnosis not present

## 2021-04-03 DIAGNOSIS — H02054 Trichiasis without entropian left upper eyelid: Secondary | ICD-10-CM | POA: Diagnosis not present

## 2021-04-08 DIAGNOSIS — H02054 Trichiasis without entropian left upper eyelid: Secondary | ICD-10-CM | POA: Diagnosis not present

## 2021-04-15 DIAGNOSIS — H16042 Marginal corneal ulcer, left eye: Secondary | ICD-10-CM | POA: Diagnosis not present

## 2021-04-16 ENCOUNTER — Encounter: Payer: Self-pay | Admitting: Internal Medicine

## 2021-04-16 NOTE — Progress Notes (Signed)
Subjective:    Patient ID: Sharon Knight, female    DOB: 1945/12/24, 75 y.o.   MRN: [id number]   This visit occurred during the SARS-CoV-2 public health emergency.  Safety protocols were in place, including screening questions prior to the visit, additional usage of staff PPE, and extensive cleaning of exam room while observing appropriate contact time as indicated for disinfecting solutions.    HPI She is here for a physical exam.   Her knee is better  - uses cane.  She is not exercising.   Has numbness in ball of feet.  Her feet feel thick at night.     Medications and allergies reviewed with patient and updated if appropriate.  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 03/16/2019   Diabetes mellitus without complication ([place]) 93/23/5573   Bilateral low back pain 03/17/2018   Gout 12/21/2017   Lobar pneumonia ([place]) 12/10/2017   Paroxysmal atrial fibrillation ([place]) 12/10/2017   History of colonic polyps 10/22/2014   History of nephrectomy, unilateral 11/13/2012   Vitamin D deficiency 10/31/2012   ECZEMA 03/03/2010   Benign neoplasm of colon 08/02/2007   Hyperlipidemia 08/02/2007   Essential hypertension 08/02/2007   Osteopenia 08/02/2007    Current Outpatient Medications on File Prior to Visit  Medication Sig Dispense Refill   acetaminophen (TYLENOL) 650 MG CR tablet Take 650 mg by mouth every 8 (eight) hours as needed for pain.     atorvastatin (LIPITOR) 20 MG tablet TAKE (1/2) TABLET DAILY. 45 tablet 2   Cholecalciferol (VITAMIN D3 PO) Take 1,000 Int'l Units/day by mouth.     Cyanocobalamin (VITAMIN B-12 PO) Take by mouth.     diclofenac Sodium (VOLTAREN) 1 % GEL Apply topically 4 (four) times daily.     diltiazem (CARDIZEM CD) 180 MG 24 hr  capsule Take 1 capsule (180 mg total) by mouth daily. 90 capsule 3   Docusate Sodium (STOOL SOFTENER LAXATIVE PO) Take 3-4 capsules by mouth daily.     ELIQUIS 5 MG TABS tablet TAKE ONE TABLET BY MOUTH TWICE DAILY 60 tablet 0   flecainide (TAMBOCOR) 100 MG tablet TAKE 1 TABLET BY MOUTH TWICE DAILY. 180 tablet 3   glucosamine-chondroitin 500-400 MG tablet Take 1 tablet by mouth 3 (three) times daily.     hydrochlorothiazide (HYDRODIURIL) 25 MG tablet TAKE ONE TABLET DAILY 90 tablet 2   Misc Natural Products (TART CHERRY ADVANCED PO) Take by mouth.     olmesartan (BENICAR) 5 MG tablet TAKE 1 TABLET ONCE DAILY. 90 tablet 3   moxifloxacin (VIGAMOX) 0.5 % ophthalmic solution SMARTSIG:1 Drop(s) Left Eye Every 2 Hours     No current facility-administered medications on file prior to visit.    Past Medical History:  Diagnosis Date   Arthritis    Bilateral cataracts    Dr Kathrin Penner   Exertional dyspnea 12/19/2020   Hyperlipidemia    Hypertension     Past Surgical History:  Procedure Laterality Date   COLONOSCOPY     Dr Fuller Plan   NEPHRECTOMY     right; for cyst on kidney   OVARIAN CYST REMOVAL      Social History   Socioeconomic History   Marital status: Married    Spouse name: Not on file  Number of children: Not on file   Years of education: Not on file   Highest education level: Not on file  Occupational History   Not on file  Tobacco Use   Smoking status: Former    Types: Cigarettes    Quit date: 08/27/1984    Years since quitting: 36.6   Smokeless tobacco: Never   Tobacco comments:    smoked age [age], up to 1.5-2 ppd  Vaping Use   Vaping Use: Never used  Substance and Sexual Activity   Alcohol use: Yes    Alcohol/week: 3.0 standard drinks    Types: 3 Cans of beer per week    Comment: 3 x weekly   Drug use: No   Sexual activity: Not on file  Other Topics Concern   Not on file  Social History Narrative   Exercise: none   Social Determinants of Health    Financial Resource Strain: Low Risk    Difficulty of Paying Living Expenses: Not hard at all  Food Insecurity: No Food Insecurity   Worried About Charity fundraiser in the Last Year: Never true   Ran Out of Food in the Last Year: Never true  Transportation Needs: No Transportation Needs   Lack of Transportation (Medical): No   Lack of Transportation (Non-Medical): No  Physical Activity: Inactive   Days of Exercise per Week: 0 days   Minutes of Exercise per Session: 0 min  Stress: No Stress Concern Present   Feeling of Stress : Not at all  Social Connections: Socially Integrated   Frequency of Communication with Friends and Family: More than three times a week   Frequency of Social Gatherings with Friends and Family: More than three times a week   Attends Religious Services: More than 4 times per year   Active Member of [employer] or Organizations: No   Attends Music therapist: More than 4 times per year   Marital Status: Married    Family History  Adopted: Yes  Problem Relation Age of Onset   Colon cancer Neg Hx    Stomach cancer Neg Hx    Breast cancer Neg Hx     Review of Systems  Constitutional:  Negative for chills and fever.  Eyes:  Negative for visual disturbance.  Respiratory:  Negative for cough, shortness of breath and wheezing.   Cardiovascular:  Negative for chest pain, palpitations and leg swelling.  Gastrointestinal:  Positive for constipation. Negative for abdominal pain, blood in stool, diarrhea and nausea.       Occ gerd  Genitourinary:  Negative for dysuria.  Musculoskeletal:  Positive for arthralgias.  Skin:  Negative for rash.  Neurological:  Positive for light-headedness (at times). Negative for headaches.  Psychiatric/Behavioral:  Negative for dysphoric mood. The patient is not nervous/anxious.       Objective:   Vitals:   04/17/21 0920  BP: 126/78  Pulse: 71  Temp: 98.1 F (36.7 C)  SpO2: 97%   Filed Weights   04/17/21 0920   Weight: 176 lb 3.2 oz (79.9 kg)   Body mass index is 29.32 kg/m.  BP Readings from Last 3 Encounters:  04/17/21 126/78  01/14/21 134/80  12/19/20 110/62    Wt Readings from Last 3 Encounters:  04/17/21 176 lb 3.2 oz (79.9 kg)  01/14/21 174 lb (78.9 kg)  12/31/20 171 lb (77.6 kg)    Depression screen [facility] 2/9 10/30/2020 10/16/2020 09/14/2019 03/17/2019 09/15/2018  Decreased Interest 0 0 0 0 0  Down, Depressed, Hopeless 0 0 1 0 0  PHQ - 2 Score 0 0 1 0 0  Altered sleeping - - 1 - -  Tired, decreased energy - - 0 - -  Change in appetite - - 0 - -  Feeling bad or failure about yourself  - - 0 - -  Trouble concentrating - - 0 - -  Moving slowly or fidgety/restless - - 0 - -  Suicidal thoughts - - 0 - -  PHQ-9 Score - - 2 - -  Difficult doing work/chores - - Somewhat difficult - -     No flowsheet data found.     Physical Exam Constitutional: She appears well-developed and well-nourished. No distress.  HENT:  Head: Normocephalic and atraumatic.  Right Ear: External ear normal. Normal ear canal and TM Left Ear: External ear normal.  Normal ear canal and TM Mouth/Throat: Oropharynx is clear and moist.  Eyes: Conjunctivae and EOM are normal.  Neck: Neck supple. No tracheal deviation present. No thyromegaly present.  No carotid bruit  Cardiovascular: Normal rate, regular rhythm and normal heart sounds.   No murmur heard.  No edema. Pulmonary/Chest: Effort normal and breath sounds normal. No respiratory distress. She has no wheezes. She has no rales.  Breast: deferred   Abdominal: Soft. She exhibits no distension. There is no tenderness.  Lymphadenopathy: She has no cervical adenopathy.  Skin: Skin is warm and dry. She is not diaphoretic.  Psychiatric: She has a normal mood and affect. Her behavior is normal.     Lab Results  Component Value Date   WBC 9.0 04/17/2020   HGB 14.5 04/17/2020   HCT 43.6 04/17/2020   PLT 337.0 04/17/2020   GLUCOSE 105 (H) 10/16/2020    CHOL 176 04/17/2020   TRIG 104.0 04/17/2020   HDL 70.20 04/17/2020   LDLCALC 85 04/17/2020   ALT 16 10/16/2020   AST 18 10/16/2020   NA 139 10/16/2020   K 3.7 10/16/2020   CL 101 10/16/2020   CREATININE 1.15 10/16/2020   BUN 20 10/16/2020   CO2 30 10/16/2020   TSH 2.62 04/17/2020   HGBA1C 7.1 (H) 10/16/2020   MICROALBUR 0.2 11/01/2007         Assessment & Plan:   Physical exam: Screening blood work  ordered Exercise  none Weight   working on weight loss Substance abuse  none   Reviewed recommended immunizations.   Health Maintenance  Topic Date Due   FOOT EXAM  03/16/2020   OPHTHALMOLOGY EXAM  12/03/2020   COVID-19 Vaccine (5 - Booster for Pfizer series) 01/16/2021   HEMOGLOBIN A1C  04/17/2021   TETANUS/TDAP  04/17/2021 (Originally 03/03/2020)   DEXA SCAN  04/17/2022 (Originally 07/02/2017)   Pneumonia Vaccine 83+ Years old  Completed   INFLUENZA VACCINE  Completed   Hepatitis C Screening  Completed   Zoster Vaccines- Shingrix  Completed   HPV VACCINES  Aged Out   COLONOSCOPY (Pts 45-65yrs Insurance coverage will need to be confirmed)  Discontinued          See Problem List for Assessment and Plan of chronic medical problems.

## 2021-04-16 NOTE — Patient Instructions (Addendum)
Blood work was ordered.      Medications changes include :   none    Please followup in 6 months    Health Maintenance, Female Adopting a healthy lifestyle and getting preventive care are important in promoting health and wellness. Ask your health care provider about: The right schedule for you to have regular tests and exams. Things you can do on your own to prevent diseases and keep yourself healthy. What should I know about diet, weight, and exercise? Eat a healthy diet  Eat a diet that includes plenty of vegetables, fruits, low-fat dairy products, and lean protein. Do not eat a lot of foods that are high in solid fats, added sugars, or sodium. Maintain a healthy weight Body mass index (BMI) is used to identify weight problems. It estimates body fat based on height and weight. Your health care provider can help determine your BMI and help you achieve or maintain a healthy weight. Get regular exercise Get regular exercise. This is one of the most important things you can do for your health. Most adults should: Exercise for at least 150 minutes each week. The exercise should increase your heart rate and make you sweat (moderate-intensity exercise). Do strengthening exercises at least twice a week. This is in addition to the moderate-intensity exercise. Spend less time sitting. Even light physical activity can be beneficial. Watch cholesterol and blood lipids Have your blood tested for lipids and cholesterol at 76 years of age, then have this test every 5 years. Have your cholesterol levels checked more often if: Your lipid or cholesterol levels are high. You are older than 76 years of age. You are at high risk for heart disease. What should I know about cancer screening? Depending on your health history and family history, you may need to have cancer screening at various ages. This may include screening for: Breast cancer. Cervical cancer. Colorectal cancer. Skin  cancer. Lung cancer. What should I know about heart disease, diabetes, and high blood pressure? Blood pressure and heart disease High blood pressure causes heart disease and increases the risk of stroke. This is more likely to develop in people who have high blood pressure readings or are overweight. Have your blood pressure checked: Every 3-5 years if you are 86-5 years of age. Every year if you are 59 years old or older. Diabetes Have regular diabetes screenings. This checks your fasting blood sugar level. Have the screening done: Once every three years after age 66 if you are at a normal weight and have a low risk for diabetes. More often and at a younger age if you are overweight or have a high risk for diabetes. What should I know about preventing infection? Hepatitis B If you have a higher risk for hepatitis B, you should be screened for this virus. Talk with your health care provider to find out if you are at risk for hepatitis B infection. Hepatitis C Testing is recommended for: Everyone born from [birth year] through 1966. Anyone with known risk factors for hepatitis C. Sexually transmitted infections (STIs) Get screened for STIs, including gonorrhea and chlamydia, if: You are sexually active and are younger than 76 years of age. You are older than 76 years of age and your health care provider tells you that you are at risk for this type of infection. Your sexual activity has changed since you were last screened, and you are at increased risk for chlamydia or gonorrhea. Ask your health care provider if you are  at risk. Ask your health care provider about whether you are at high risk for HIV. Your health care provider may recommend a prescription medicine to help prevent HIV infection. If you choose to take medicine to prevent HIV, you should first get tested for HIV. You should then be tested every 3 months for as long as you are taking the medicine. Pregnancy If you are about to stop  having your period (premenopausal) and you may become pregnant, seek counseling before you get pregnant. Take 400 to 800 micrograms (mcg) of folic acid every day if you become pregnant. Ask for birth control (contraception) if you want to prevent pregnancy. Osteoporosis and menopause Osteoporosis is a disease in which the bones lose minerals and strength with aging. This can result in bone fractures. If you are 72 years old or older, or if you are at risk for osteoporosis and fractures, ask your health care provider if you should: Be screened for bone loss. Take a calcium or vitamin D supplement to lower your risk of fractures. Be given hormone replacement therapy (HRT) to treat symptoms of menopause. Follow these instructions at home: Alcohol use Do not drink alcohol if: Your health care provider tells you not to drink. You are pregnant, may be pregnant, or are planning to become pregnant. If you drink alcohol: Limit how much you have to: 0-1 drink a day. Know how much alcohol is in your drink. In the [place] one drink equals one 12 oz bottle of beer (355 mL), one 5 oz glass of wine (148 mL), or one 1 oz glass of hard liquor (44 mL). Lifestyle Do not use any products that contain nicotine or tobacco. These products include cigarettes, chewing tobacco, and vaping devices, such as e-cigarettes. If you need help quitting, ask your health care provider. Do not use street drugs. Do not share needles. Ask your health care provider for help if you need support or information about quitting drugs. General instructions Schedule regular health, dental, and eye exams. Stay current with your vaccines. Tell your health care provider if: You often feel depressed. You have ever been abused or do not feel safe at home. Summary Adopting a healthy lifestyle and getting preventive care are important in promoting health and wellness. Follow your health care provider's instructions about healthy diet,  exercising, and getting tested or screened for diseases. Follow your health care provider's instructions on monitoring your cholesterol and blood pressure. This information is not intended to replace advice given to you by your health care provider. Make sure you discuss any questions you have with your health care provider. Document Revised: 09/09/2020 Document Reviewed: 09/09/2020 Elsevier Patient Education  [employer].

## 2021-04-17 ENCOUNTER — Other Ambulatory Visit: Payer: Self-pay

## 2021-04-17 ENCOUNTER — Ambulatory Visit (INDEPENDENT_AMBULATORY_CARE_PROVIDER_SITE_OTHER): Payer: Medicare Other | Admitting: Internal Medicine

## 2021-04-17 VITALS — BP 126/78 | HR 71 | Temp 98.1°F | Ht 65.0 in | Wt 176.2 lb

## 2021-04-17 DIAGNOSIS — M85852 Other specified disorders of bone density and structure, left thigh: Secondary | ICD-10-CM

## 2021-04-17 DIAGNOSIS — E782 Mixed hyperlipidemia: Secondary | ICD-10-CM

## 2021-04-17 DIAGNOSIS — E559 Vitamin D deficiency, unspecified: Secondary | ICD-10-CM | POA: Diagnosis not present

## 2021-04-17 DIAGNOSIS — N1831 Chronic kidney disease, stage 3a: Secondary | ICD-10-CM

## 2021-04-17 DIAGNOSIS — M85851 Other specified disorders of bone density and structure, right thigh: Secondary | ICD-10-CM

## 2021-04-17 DIAGNOSIS — I480 Paroxysmal atrial fibrillation: Secondary | ICD-10-CM | POA: Diagnosis not present

## 2021-04-17 DIAGNOSIS — E785 Hyperlipidemia, unspecified: Secondary | ICD-10-CM | POA: Diagnosis not present

## 2021-04-17 DIAGNOSIS — E1122 Type 2 diabetes mellitus with diabetic chronic kidney disease: Secondary | ICD-10-CM

## 2021-04-17 DIAGNOSIS — M8580 Other specified disorders of bone density and structure, unspecified site: Secondary | ICD-10-CM | POA: Diagnosis not present

## 2021-04-17 DIAGNOSIS — I10 Essential (primary) hypertension: Secondary | ICD-10-CM

## 2021-04-17 DIAGNOSIS — N183 Chronic kidney disease, stage 3 unspecified: Secondary | ICD-10-CM

## 2021-04-17 DIAGNOSIS — E119 Type 2 diabetes mellitus without complications: Secondary | ICD-10-CM | POA: Diagnosis not present

## 2021-04-17 DIAGNOSIS — M109 Gout, unspecified: Secondary | ICD-10-CM

## 2021-04-17 DIAGNOSIS — I129 Hypertensive chronic kidney disease with stage 1 through stage 4 chronic kidney disease, or unspecified chronic kidney disease: Secondary | ICD-10-CM

## 2021-04-17 DIAGNOSIS — Z0000 Encounter for general adult medical examination without abnormal findings: Secondary | ICD-10-CM

## 2021-04-17 LAB — CBC WITH DIFFERENTIAL/PLATELET
Basophils Absolute: 0.1 10*3/uL (ref 0.0–0.1)
Basophils Relative: 0.8 % (ref 0.0–3.0)
Eosinophils Absolute: 0.4 10*3/uL (ref 0.0–0.7)
Eosinophils Relative: 3.2 % (ref 0.0–5.0)
HCT: 40.8 % (ref 36.0–46.0)
Hemoglobin: 13.5 g/dL (ref 12.0–15.0)
Lymphocytes Relative: 11.8 % — ABNORMAL LOW (ref 12.0–46.0)
Lymphs Abs: 1.3 10*3/uL (ref 0.7–4.0)
MCHC: 33.1 g/dL (ref 30.0–36.0)
MCV: 91.7 fl (ref 78.0–100.0)
Monocytes Absolute: 1.2 10*3/uL — ABNORMAL HIGH (ref 0.1–1.0)
Monocytes Relative: 10.5 % (ref 3.0–12.0)
Neutro Abs: 8.3 10*3/uL — ABNORMAL HIGH (ref 1.4–7.7)
Neutrophils Relative %: 73.7 % (ref 43.0–77.0)
Platelets: 359 10*3/uL (ref 150.0–400.0)
RBC: 4.45 Mil/uL (ref 3.87–5.11)
RDW: 12.9 % (ref 11.5–15.5)
WBC: 11.3 10*3/uL — ABNORMAL HIGH (ref 4.0–10.5)

## 2021-04-17 LAB — LIPID PANEL
Cholesterol: 169 mg/dL (ref 0–200)
HDL: 69.4 mg/dL (ref >39.00)
LDL Cholesterol: 73 mg/dL (ref 0–99)
NonHDL: 99.24
Total CHOL/HDL Ratio: 2
Triglycerides: 130 mg/dL (ref 0.0–149.0)
VLDL: 26 mg/dL (ref 0.0–40.0)

## 2021-04-17 LAB — VITAMIN D 25 HYDROXY (VIT D DEFICIENCY, FRACTURES): VITD: 30.2 ng/mL (ref 30.00–100.00)

## 2021-04-17 LAB — COMPREHENSIVE METABOLIC PANEL
ALT: 21 U/L (ref 0–35)
AST: 21 U/L (ref 0–37)
Albumin: 4.4 g/dL (ref 3.5–5.2)
Alkaline Phosphatase: 97 U/L (ref 39–117)
BUN: 23 mg/dL (ref 6–23)
CO2: 30 mEq/L (ref 19–32)
Calcium: 10.3 mg/dL (ref 8.4–10.5)
Chloride: 101 mEq/L (ref 96–112)
Creatinine, Ser: 1.16 mg/dL (ref 0.40–1.20)
GFR: 45.71 mL/min — ABNORMAL LOW (ref >60.00)
Glucose, Bld: 171 mg/dL — ABNORMAL HIGH (ref 70–99)
Potassium: 4.3 mEq/L (ref 3.5–5.1)
Sodium: 140 mEq/L (ref 135–145)
Total Bilirubin: 0.5 mg/dL (ref 0.2–1.2)
Total Protein: 7.6 g/dL (ref 6.0–8.3)

## 2021-04-17 LAB — URINALYSIS, ROUTINE W REFLEX MICROSCOPIC
Bilirubin Urine: NEGATIVE
Hgb urine dipstick: NEGATIVE
Ketones, ur: NEGATIVE
Leukocytes,Ua: NEGATIVE
Nitrite: NEGATIVE
RBC / HPF: NONE SEEN (ref 0–?)
Specific Gravity, Urine: 1.02 (ref 1.000–1.030)
Total Protein, Urine: NEGATIVE
Urine Glucose: NEGATIVE
Urobilinogen, UA: 0.2 (ref 0.0–1.0)
pH: 5.5 (ref 5.0–8.0)

## 2021-04-17 LAB — TSH: TSH: 2.85 u[IU]/mL (ref 0.35–5.50)

## 2021-04-17 LAB — MICROALBUMIN / CREATININE URINE RATIO
Creatinine,U: 105 mg/dL
Microalb Creat Ratio: 0.7 mg/g (ref 0.0–30.0)
Microalb, Ur: <0.7 mg/dL (ref 0.0–1.9)

## 2021-04-17 LAB — URIC ACID: Uric Acid, Serum: 8.4 mg/dL — ABNORMAL HIGH (ref 2.4–7.0)

## 2021-04-17 LAB — HEMOGLOBIN A1C: Hgb A1c MFr Bld: 7.2 % — ABNORMAL HIGH (ref 4.6–6.5)

## 2021-04-17 NOTE — Assessment & Plan Note (Signed)
Chronic Check lipid panel  Continue atorvastatin 20 mg daily Regular exercise and healthy diet encouraged  

## 2021-04-17 NOTE — Assessment & Plan Note (Addendum)
Chronic Diet controlled A1c, urine microalbumin Diabetic diet, regular exercise Work on weight loss Can consider farxiga

## 2021-04-17 NOTE — Assessment & Plan Note (Signed)
Chronic BP well controlled Continue diltiazem 180 mg daily, hctz 25 mg qd, olmesartan 5 mg daily cmp

## 2021-04-17 NOTE — Assessment & Plan Note (Signed)
Chronic Advised exercise, low sodium diet, no nsaids, good amount of water daily Cmp, ua, urine microalbumin

## 2021-04-17 NOTE — Assessment & Plan Note (Signed)
Chronic Taking vitamin D daily Check vitamin D level  

## 2021-04-17 NOTE — Assessment & Plan Note (Signed)
Chronic dexa due - she deferred for now Stressed regular exercise  Taking calcium and vitamin d daily

## 2021-04-17 NOTE — Assessment & Plan Note (Signed)
Chronic No palps In NSR on exam On diltiazem, eliquis and flecainide

## 2021-04-17 NOTE — Assessment & Plan Note (Addendum)
Chronic Ck uric acid

## 2021-04-30 ENCOUNTER — Other Ambulatory Visit: Payer: Self-pay | Admitting: Internal Medicine

## 2021-05-07 ENCOUNTER — Telehealth: Payer: Self-pay

## 2021-05-07 NOTE — Progress Notes (Signed)
° ° °  Chronic Care Management Pharmacy Assistant   Name: Sharon Knight  MRN: [id number] DOB: [birth year]   Reason for Encounter: Disease State   Conditions to be addressed/monitored: DMII   Recent office visits:  04/17/21 Sharon Rail, MD-PCP (annual Exam) Blood work was ordered.Medications changes include : none, follow up in 6 months  Recent consult visits:  None Sharon Knight visits:  None in previous 6 months  Medications: Outpatient Encounter Medications as of 05/07/2021  Medication Sig   acetaminophen (TYLENOL) 650 MG CR tablet Take 650 mg by mouth every 8 (eight) hours as needed for pain.   atorvastatin (LIPITOR) 20 MG tablet TAKE (1/2) TABLET DAILY.   Cholecalciferol (VITAMIN D3 PO) Take 1,000 Int'l Units/day by mouth.   Cyanocobalamin (VITAMIN B-12 PO) Take by mouth.   diclofenac Sodium (VOLTAREN) 1 % GEL Apply topically 4 (four) times daily.   diltiazem (CARDIZEM CD) 180 MG 24 hr capsule Take 1 capsule (180 mg total) by mouth daily.   Docusate Sodium (STOOL SOFTENER LAXATIVE PO) Take 3-4 capsules by mouth daily.   ELIQUIS 5 MG TABS tablet TAKE ONE TABLET BY MOUTH TWICE DAILY   flecainide (TAMBOCOR) 100 MG tablet TAKE 1 TABLET BY MOUTH TWICE DAILY.   glucosamine-chondroitin 500-400 MG tablet Take 1 tablet by mouth 3 (three) times daily.   hydrochlorothiazide (HYDRODIURIL) 25 MG tablet TAKE ONE TABLET DAILY   Misc Natural Products (TART CHERRY ADVANCED PO) Take by mouth.   moxifloxacin (VIGAMOX) 0.5 % ophthalmic solution SMARTSIG:1 Drop(s) Left Eye Every 2 Hours   olmesartan (BENICAR) 5 MG tablet TAKE 1 TABLET ONCE DAILY.   No facility-administered encounter medications on file as of 05/07/2021.   Recent Relevant Labs: Lab Results  Component Value Date/Time   HGBA1C 7.2 (H) 04/17/2021 10:23 AM   HGBA1C 7.1 (H) 10/16/2020 11:07 AM   MICROALBUR <0.7 04/17/2021 10:23 AM   MICROALBUR 0.2 11/01/2007 08:29 AM    Kidney Function Lab Results  Component Value Date/Time    CREATININE 1.16 04/17/2021 10:23 AM   CREATININE 1.15 10/16/2020 11:07 AM   GFR 45.71 (L) 04/17/2021 10:23 AM   GFRNONAA 54 (L) 06/02/2018 10:15 AM   GFRAA 62 06/02/2018 10:15 AM    Current antihyperglycemic regimen:  Patient is not on any diabetic medications  What recent interventions/DTPs have been made to improve glycemic control:  Diabetic diet and exercise, consider farxiga, per Dr. Quay Knight  Have there been any recent hospitalizations or ED visits since last visit with CPP? No  Patient denies hypoglycemic symptoms, including None  Patient denies hyperglycemic symptoms, including none  How often are you checking your blood sugar? Patient does not take blood sugar  During the week, how often does your blood glucose drop below 70? Never  Are you checking your feet daily/regularly? Patient states that she does not have any issues at this time with her feet  Adherence Review: Is the patient currently on a STATIN medication? Yes Is the patient currently on ACE/ARB medication? Yes Does the patient have >5 day gap between last estimated fill dates? No   Care Gaps: Colonoscopy-NA Diabetic Foot Exam-03/17/19 Mammogram-NA Ophthalmology-12/04/19 Dexa Scan - 07/03/15 Annual Well Visit - 04/17/21 Micro albumin-04/17/21 Hemoglobin A1c- 04/17/21  Star Rating Drugs: Atorvastatin 20 mg-last fill 03/10/21 90 ds Olmesartan 5 mg-last fill 03/30/21 90 ds  Sharon Knight Clinical Pharmacist Assistant [phone]

## 2021-05-21 DIAGNOSIS — H02054 Trichiasis without entropian left upper eyelid: Secondary | ICD-10-CM | POA: Diagnosis not present

## 2021-05-21 DIAGNOSIS — Z961 Presence of intraocular lens: Secondary | ICD-10-CM | POA: Diagnosis not present

## 2021-05-29 ENCOUNTER — Other Ambulatory Visit: Payer: Self-pay | Admitting: Internal Medicine

## 2021-06-09 DIAGNOSIS — C44319 Basal cell carcinoma of skin of other parts of face: Secondary | ICD-10-CM | POA: Diagnosis not present

## 2021-06-09 DIAGNOSIS — D485 Neoplasm of uncertain behavior of skin: Secondary | ICD-10-CM | POA: Diagnosis not present

## 2021-06-16 ENCOUNTER — Encounter: Payer: Self-pay | Admitting: Internal Medicine

## 2021-06-16 NOTE — Progress Notes (Signed)
duplicate

## 2021-06-23 ENCOUNTER — Ambulatory Visit (HOSPITAL_BASED_OUTPATIENT_CLINIC_OR_DEPARTMENT_OTHER): Payer: Medicare Other | Admitting: Family

## 2021-06-23 ENCOUNTER — Other Ambulatory Visit: Payer: Self-pay

## 2021-06-23 ENCOUNTER — Encounter (HOSPITAL_BASED_OUTPATIENT_CLINIC_OR_DEPARTMENT_OTHER): Payer: Self-pay | Admitting: Family

## 2021-06-23 VITALS — BP 112/62 | HR 62 | Ht 65.0 in | Wt 174.0 lb

## 2021-06-23 DIAGNOSIS — E782 Mixed hyperlipidemia: Secondary | ICD-10-CM

## 2021-06-23 DIAGNOSIS — I480 Paroxysmal atrial fibrillation: Secondary | ICD-10-CM

## 2021-06-23 DIAGNOSIS — I10 Essential (primary) hypertension: Secondary | ICD-10-CM | POA: Diagnosis not present

## 2021-06-23 DIAGNOSIS — D6859 Other primary thrombophilia: Secondary | ICD-10-CM | POA: Diagnosis not present

## 2021-06-23 MED ORDER — APIXABAN 5 MG PO TABS: 5.0000 mg | ORAL_TABLET | Freq: Two times a day (BID) | ORAL | 5 refills | Status: DC

## 2021-06-23 NOTE — Patient Instructions (Addendum)
Medication Instructions:  Continue your current medications.   Encourage you to consider Allopurinol for your gout as recommended by Dr. [name].   *If you need a refill on your cardiac medications before your next appointment, please call your pharmacy*  Lab Work: None ordered today.   Testing/Procedures: Your EKG today shows normal sinus rhythm which is a good result   Follow-Up: At [facility], you and your health needs are our priority.  As part of our continuing mission to provide you with exceptional heart care, we have created designated Provider Care Teams.  These Care Teams include your primary Cardiologist (physician) and Advanced Practice Providers (APPs -  Physician Assistants and Nurse Practitioners) who all work together to provide you with the care you need, when you need it.  We recommend signing up for the patient portal called "MyChart".  Sign up information is provided on this After Visit Summary.  MyChart is used to connect with patients for Virtual Visits (Telemedicine).  Patients are able to view lab/test results, encounter notes, upcoming appointments, etc.  Non-urgent messages can be sent to your provider as well.   To learn more about what you can do with MyChart, go to [email or web address].    Your next appointment:   6 month(s)  The format for your next appointment:   In Person  Provider:   Skeet Latch, MD    Other Instructions  If you are interested in pursuing physical therapy through home health, please call and let us know.    Exercises to do While Sitting Exercises that you do while sitting (chair exercises) can give you many of the same benefits as full exercise. Benefits include strengthening your heart, burning calories, and keeping muscles and joints healthy. Exercise can also improve your mood and help with depression and anxiety. You may benefit from chair exercises if you are unable to do standing exercises due to: Diabetic foot  pain. Obesity. Illness. Arthritis. Recovery from surgery or injury. Breathing problems. Balance problems. Another type of disability. Before starting chair exercises, check with your health care provider or a physical therapist to find out how much exercise you can tolerate and which exercises are safe for you. If your health care provider approves: Start out slowly and build up over time. Aim to work up to about 10-20 minutes for each exercise session. Make exercise part of your daily routine. Drink water when you exercise. Do not wait until you are thirsty. Drink every 10-15 minutes. Stop exercising right away if you have pain, nausea, shortness of breath, or dizziness. If you are exercising in a wheelchair, make sure to lock the wheels. Ask your health care provider whether you can do tai chi or yoga. Many positions in these mind-body exercises can be modified to do while seated. Warm-up Before starting other exercises: Sit up as straight as you can. Have your knees bent at 90 degrees, which is the shape of the capital letter "L." Keep your feet flat on the floor. Sit at the front edge of your chair, if you can. Pull in (tighten) the muscles in your abdomen and stretch your spine and neck as straight as you can. Hold this position for a few minutes. Breathe in and out evenly. Try to concentrate on your breathing, and relax your mind. Stretching Exercise A: Arm stretch Hold your arms out straight in front of your body. Bend your hands at the wrist with your fingers pointing up, as if signaling someone to stop. Notice the slight  tension in your forearms as you hold the position. Keeping your arms out and your hands bent, rotate your hands outward as far as you can and hold this stretch. Aim to have your thumbs pointing up and your pinkie fingers pointing down. Slowly repeat arm stretches for one minute as tolerated. Exercise B: Leg stretch If you can move your legs, try to "draw" letters  on the floor with the toes of your foot. Write your name with one foot. Write your name with the toes of your other foot. Slowly repeat the movements for one minute as tolerated. Exercise C: Reach for the sky Reach your hands as far over your head as you can to stretch your spine. Move your hands and arms as if you are climbing a rope. Slowly repeat the movements for one minute as tolerated. Range of motion exercises Exercise A: Shoulder roll Let your arms hang loosely at your sides. Lift just your shoulders up toward your ears, then let them relax back down. When your shoulders feel loose, rotate your shoulders in backward and forward circles. Do shoulder rolls slowly for one minute as tolerated. Exercise B: March in place As if you are marching, pump your arms and lift your legs up and down. Lift your knees as high as you can. If you are unable to lift your knees, just pump your arms and move your ankles and feet up and down. March in place for one minute as tolerated. Exercise C: Seated jumping jacks Let your arms hang down straight. Keeping your arms straight, lift them up over your head. Aim to point your fingers to the ceiling. While you lift your arms, straighten your legs and slide your heels along the floor to your sides, as wide as you can. As you bring your arms back down to your sides, slide your legs back together. If you are unable to use your legs, just move your arms. Slowly repeat seated jumping jacks for one minute as tolerated. Strengthening exercises Exercise A: Shoulder squeeze Hold your arms straight out from your body to your sides, with your elbows bent and your fists pointed at the ceiling. Keeping your arms in the bent position, move them forward so your elbows and forearms meet in front of your face. Open your arms back out as wide as you can with your elbows still bent, until you feel your shoulder blades squeezing together. Hold for 5 seconds. Slowly repeat  the movements forward and backward for one minute as tolerated. Contact a health care provider if: You have to stop exercising due to any of the following: Pain. Nausea. Shortness of breath. Dizziness. Fatigue. You have significant pain or soreness after exercising. Get help right away if: You have chest pain. You have difficulty breathing. These symptoms may represent a serious problem that is an emergency. Do not wait to see if the symptoms will go away. Get medical help right away. Call your local emergency services (911 in the [place] Do not drive yourself to the hospital. Summary Exercises that you do while sitting (chair exercises) can strengthen your heart, burn calories, and keep muscles and joints healthy. You may benefit from chair exercises if you are unable to do standing exercises due to diabetic foot pain, obesity, recovery from surgery or injury, or other conditions. Before starting chair exercises, check with your health care provider or a physical therapist to find out how much exercise you can tolerate and which exercises are safe for you. This information is  not intended to replace advice given to you by your health care provider. Make sure you discuss any questions you have with your health care provider. Document Revised: 06/16/2020 Document Reviewed: 06/16/2020 Elsevier Patient Education  2022 [employer].

## 2021-06-23 NOTE — Progress Notes (Signed)
Office Visit    Patient Name: Sharon Knight Date of Encounter: 06/23/2021  PCP:  Binnie Rail, MD   [place]  Cardiologist:  Skeet Latch, MD  Advanced Practice Provider:  No care team member to display Electrophysiologist:  Will Meredith Leeds, MD      Chief Complaint    Sharon Knight is a 76 y.o. female with a hx of PAF, HTN, HLD, DM2, right nephrectomy presents today for follow up of paroxysmal atrial fibrillation.    Past Medical History    Past Medical History:  Diagnosis Date   Arthritis    Bilateral cataracts    Dr Kathrin Penner   Exertional dyspnea 12/19/2020   Hyperlipidemia    Hypertension    Past Surgical History:  Procedure Laterality Date   COLONOSCOPY     Dr Fuller Plan   NEPHRECTOMY     right; for cyst on kidney   OVARIAN CYST REMOVAL      Allergies  Allergies  Allergen Reactions   Ibuprofen Other (See Comments)    Dizziness "made me woozy"    History of Present Illness    Sharon Knight is a 76 y.o. female with a hx of PAF, HTN, HLD, DM2, right nephrectomy last seen 12/2020 by Dr. Oval Linsey.  She was admitted 12/2017 with atrial fibrillation with RVR in setting of sepsis and pneumonia. Home dose Metoprolol increased and Eliquis started. Echo 12/2017 LVEF 65-70%, moderate LVH. At follow up 12/22/17 she was in NSR. She was seen by Dr. Curt Bears 12/30/17 and started on Flecainide. Metoprolol was discontinued due to bradycardia. HCYZ increased and Olmesartan added due to BP.   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 Myoview and TSH were ordered. Myoview 12/31/20 was low risk with no evidence of ischemia.   Presents today for follow up. Tells me her left knee is somewhat improved. She is wearing sleeves on both of her knees today. 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 Reports no shortness of breath at rest and stable  dyspnea on exertion. Till with exercise tolerance though no formal exercise routine. Reports no chest pain, pressure, or tightness. No edema, orthopnea, PND. Reports rare palpitations which are fleeting. Does endorse stress caretaking for her husband and managing her home.   EKGs/Labs/Other Studies Reviewed:   The following studies were reviewed today:  Myoview 12/31/20  Lexiscan stress is electrically negative for ischemia   Myoview scan shows normal perfusion.   Nuclear stress EF: 77 %. The left ventricular ejection fraction is hyperdynamic (>65%). Left ventricular function is normal. End diastolic cavity size is normal.   Low risk scan.   ETT 01/25/18: 4.6 METS on Bruce protocol.  No ischemia.   Echocardiogram 12/11/2017 Study Conclusions - Left ventricle: The cavity size was normal. Wall thickness was   increased in a pattern of moderate LVH. Systolic function was   vigorous. The estimated ejection fraction was in the range of 65%   to 70%. Wall motion was normal; there were no regional wall   motion abnormalities. The study was not technically sufficient to   allow evaluation of LV diastolic dysfunction due to atrial   fibrillation. - Aortic valve: Valve area (VTI): 2.14 cm^2. Valve area (Vmax):   2.43 cm^2. Valve area (Vmean): 2.33 cm^2. - Mitral valve: Valve area by continuity equation (using LVOT   flow): 1.61 cm^2. -  Atrial septum: No defect or patent foramen ovale was identified   30 Day Event Monitor 12/23/17:    Quality: Fair.  Baseline artifact. Predominant rhythm: sinus rhythm Average heart rate: 68 bpm Max heart rate: 174 bpm Min heart rate: 38 bpm 199 pauses >3 seconds Pauses noted up to 6 seconds. All occurred over night or early morning. PVCs Atrial flutter 16% (71% controlled, <1% slow, 29% rapid)   EKG:  EKG is ordered today.  The ekg ordered today demonstrates NSR 62 bpm with no acute ST/T wave changes. Poor R wave progression.   Recent Labs: 04/17/2021:  ALT 21; BUN 23; Creatinine, Ser 1.16; Hemoglobin 13.5; Platelets 359.0; Potassium 4.3; Sodium 140; TSH 2.85  Recent Lipid Panel    Component Value Date/Time   CHOL 169 04/17/2021 1023   CHOL 174 12/05/2012 1538   TRIG 130.0 04/17/2021 1023   TRIG 109 12/05/2012 1538   HDL 69.40 04/17/2021 1023   HDL 60 12/05/2012 1538   CHOLHDL 2 04/17/2021 1023   VLDL 26.0 04/17/2021 1023   LDLCALC 73 04/17/2021 1023   LDLCALC 92 12/05/2012 1538    Risk Assessment/Calculations:   CHA2DS2-VASc Score = 5   This indicates a 7.2% annual risk of stroke. The patient's score is based upon: CHF History: 0 HTN History: 1 Diabetes History: 1 Stroke History: 0 Vascular Disease History: 0 Age Score: 2 Gender Score: 1   Home Medications   Current Meds  Medication Sig   acetaminophen (TYLENOL) 650 MG CR tablet Take 650 mg by mouth every 8 (eight) hours as needed for pain.   atorvastatin (LIPITOR) 20 MG tablet TAKE (1/2) TABLET DAILY.   Cholecalciferol (VITAMIN D3 PO) Take 1,000 Int'l Units/day by mouth.   Cyanocobalamin (VITAMIN B-12 PO) Take by mouth.   diltiazem (CARDIZEM CD) 180 MG 24 hr capsule Take 1 capsule (180 mg total) by mouth daily.   Docusate Sodium (STOOL SOFTENER LAXATIVE PO) Take 3-4 capsules by mouth daily.   flecainide (TAMBOCOR) 100 MG tablet TAKE 1 TABLET BY MOUTH TWICE DAILY.   hydrochlorothiazide (HYDRODIURIL) 25 MG tablet TAKE ONE TABLET DAILY   Misc Natural Products (TART CHERRY ADVANCED PO) Take 2 capsules by mouth 2 (two) times daily.   olmesartan (BENICAR) 5 MG tablet TAKE 1 TABLET ONCE DAILY.   [DISCONTINUED] ELIQUIS 5 MG TABS tablet TAKE ONE TABLET BY MOUTH TWICE DAILY     Review of Systems      All other systems reviewed and are otherwise negative except as noted above.  Physical Exam    VS:  BP 112/62 (BP Location: Left Arm, Patient Position: Sitting, Cuff Size: Normal)    Pulse 62    Ht 5\' 5"  (1.651 m)    Wt 174 lb (78.9 kg)    SpO2 97%    BMI 28.96 kg/m  , BMI  Body mass index is 28.96 kg/m.  Wt Readings from Last 3 Encounters:  06/23/21 174 lb (78.9 kg)  04/17/21 176 lb 3.2 oz (79.9 kg)  01/14/21 174 lb (78.9 kg)    GEN: Well nourished, overweight,  well developed, in no acute distress. HEENT: normal. Neck: Supple, no JVD, carotid bruits, or masses. Cardiac: RRR, no murmurs, rubs, or gallops. No clubbing, cyanosis, edema.  Radials/PT 2+ and equal bilaterally.  Respiratory:  Respirations regular and unlabored, clear to auscultation bilaterally. GI: Soft, nontender, nondistended. MS: No deformity or atrophy. Skin: Warm and dry, no rash. Neuro:  Strength and sensation are intact. Psych: Normal affect.  Assessment &  Plan    PAF / Chronic anticoagulation - EKG today shows normal sinus rhythm.  04/17/21 Hb 13.5. Continue Eliquis 5mg  BID. Refill provided. Does not meet dose reduction criteria. Denies bleeding complications.  CHA2DS2-VASc Score = 5 [CHF History: 0, HTN History: 1, Diabetes History: 1, Stroke History: 0, Vascular Disease History: 0, Age Score: 2, Gender Score: 1].  Therefore, the patient's annual risk of stroke is 7.2 %.      HTN - BP well controlled. Continue current antihypertensive regimen including HCTZ 25mg  QD, Olmesartan 5mg  QD, Diltiazem 180mg  QD.   HLD - 04/2021 AST 21, ALT 21, total cholesterol 169, triglycerides 130, HDL 69.4, LDL 73. Continue Atorvastatin 10mg  daily.   Gout / chronic knee pain - Follows with sports medicine and PCP. She has been hesitant to start Allopurinol due to previous nephrectomy. Reassured that her primary care physician would monitor levels carefully and this may allow her to increase her exercise tolerance. Offered referral to [facility] PT which she politely declines.  Exercise tolerance - 12/2020 myoview low risk. No orthopnea, edema, PND concerning for HF. No indication for further cardiac testing at this time. Likely etiology deconditioning.   DM2 - 04/2021 A1c 7.2. Continue to follow with PCP. Not  presently on medical therapy. Plan to recheck in 6 months after lifestyle changes.   Disposition: Follow up in 6 month(s) with Skeet Latch, MD or APP.  Signed, Loel Dubonnet, NP 06/23/2021, 11:52 AM [place]

## 2021-07-22 LAB — HM DIABETES EYE EXAM

## 2021-08-28 ENCOUNTER — Telehealth: Payer: Medicare Other

## 2021-08-29 ENCOUNTER — Ambulatory Visit (INDEPENDENT_AMBULATORY_CARE_PROVIDER_SITE_OTHER): Payer: Medicare Other

## 2021-08-29 DIAGNOSIS — I480 Paroxysmal atrial fibrillation: Secondary | ICD-10-CM

## 2021-08-29 DIAGNOSIS — E782 Mixed hyperlipidemia: Secondary | ICD-10-CM

## 2021-08-29 DIAGNOSIS — I10 Essential (primary) hypertension: Secondary | ICD-10-CM

## 2021-08-29 DIAGNOSIS — N1831 Chronic kidney disease, stage 3a: Secondary | ICD-10-CM

## 2021-08-29 DIAGNOSIS — E119 Type 2 diabetes mellitus without complications: Secondary | ICD-10-CM

## 2021-08-29 NOTE — Progress Notes (Signed)
? ?Chronic Care Management ?Pharmacy Note ?08/29/2021 ?Name:  Sharon Knight MRN:  [id number] DOB:  January 09, 1946 ? ?Summary: ?-Patient reports she has been compliant with current medications, denies any issues or concerns  ?-Notes that she has changed her diet, avoiding red meats, alcohol, smoked meats, and shellfish to help lower uric acid levels ?-BP controlled in office, is not currently checking BP at home  ? ?Recommendations/Changes made from today's visit: ?-Advised for patient to have Tdap vaccine updated with next visit to pharmacy / [place] office  ?-Recheck uric acid levels and A1c with next PCP appointment -patient agreeable to start allopurinol if uric acid levels remain elevated - could consider farxiga should A1c remain elevated from goal  ?-F/u in 6 months  ? ? ?Subjective: ?Sharon Knight is an 75 y.o. year old female who is a primary patient of Burns, Claudina Lick, MD.  The CCM team was consulted for assistance with disease management and care coordination needs.   ? ?Engaged with patient by telephone for follow up visit in response to provider referral for pharmacy case management and/or care coordination services.  ? ?Consent to Services:  ?The patient was given information about Chronic Care Management services, agreed to services, and gave verbal consent prior to initiation of services.  Please see initial visit note for detailed documentation.  ? ?Patient Care Team: ?Binnie Rail, MD as PCP - General (Internal Medicine) ?Skeet Latch, MD as PCP - Cardiology (Cardiology) ?Constance Haw, MD as PCP - Electrophysiology (Cardiology) ?Jalene [place], MD as Consulting Physician (Ophthalmology) ?Opthamology, [facility] (Ophthalmology) ?Tomasa Blase, [facility] (Pharmacist) ? ?Recent office visits: ?04/17/2021 - Dr. [name] - dexa deferred by patient - no changes to medications - f/u in 6 months  ? ?Recent consult visits: ?06/23/2021 - Laurann Montana NP - Cardiology - no changes to medications - f/u  in 6 months  ? ?Hospital visits: ?None in previous 6 months ? ?Objective: ? ?Lab Results  ?Component Value Date  ? CREATININE 1.16 04/17/2021  ? BUN 23 04/17/2021  ? GFR 45.71 (L) 04/17/2021  ? GFRNONAA 54 (L) 06/02/2018  ? GFRAA 62 06/02/2018  ? NA 140 04/17/2021  ? K 4.3 04/17/2021  ? CALCIUM 10.3 04/17/2021  ? CO2 30 04/17/2021  ? GLUCOSE 171 (H) 04/17/2021  ? ? ?Lab Results  ?Component Value Date/Time  ? HGBA1C 7.2 (H) 04/17/2021 10:23 AM  ? HGBA1C 7.1 (H) 10/16/2020 11:07 AM  ? GFR 45.71 (L) 04/17/2021 10:23 AM  ? GFR 46.35 (L) 10/16/2020 11:07 AM  ? MICROALBUR <0.7 04/17/2021 10:23 AM  ? MICROALBUR 0.2 11/01/2007 08:29 AM  ?  ?Last diabetic Eye exam:  ?Lab Results  ?Component Value Date/Time  ? HMDIABEYEEXA No Retinopathy 07/22/2021 09:42 AM  ?  ?Last diabetic Foot exam: No results found for: HMDIABFOOTEX  ? ?Lab Results  ?Component Value Date  ? CHOL 169 04/17/2021  ? HDL 69.40 04/17/2021  ? [place] 73 04/17/2021  ? TRIG 130.0 04/17/2021  ? CHOLHDL 2 04/17/2021  ? ? ? ?  Latest Ref Rng & Units 04/17/2021  ? 10:23 AM 10/16/2020  ? 11:07 AM 04/17/2020  ? 12:23 PM  ?Hepatic Function  ?Total Protein 6.0 - 8.3 g/dL 7.6   7.8   7.9    ?Albumin 3.5 - 5.2 g/dL 4.4   4.4   4.5    ?AST 0 - 37 U/L _0 ?ALT 0 - 35 U/L 21   16  21    ?Alk Phosphatase 39 - 117 U/L 97   95   86    ?Total Bilirubin 0.2 - 1.2 mg/dL 0.5   0.5   0.6    ? ? ?Lab Results  ?Component Value Date/Time  ? TSH 2.85 04/17/2021 10:23 AM  ? TSH 2.62 04/17/2020 12:23 PM  ? ? ? ?  Latest Ref Rng & Units 04/17/2021  ? 10:23 AM 04/17/2020  ? 12:23 PM 09/14/2019  ? 11:33 AM  ?CBC  ?WBC 4.0 - 10.5 K/uL 11.3   9.0   8.7    ?Hemoglobin 12.0 - 15.0 g/dL 13.5   14.5   14.4    ?Hematocrit 36.0 - 46.0 % 40.8   43.6   42.5    ?Platelets 150.0 - 400.0 K/uL 359.0   337.0   320.0    ? ? ?Lab Results  ?Component Value Date/Time  ? VD25OH 30.20 04/17/2021 10:23 AM  ? VD25OH 34.51 04/17/2020 12:23 PM  ? ? ?Clinical ASCVD: No  ?The 10-year ASCVD risk score  (Arnett DK, et al., 2019) is: 36.1% ?  Values used to calculate the score: ?    Age: 63 years ?    Sex: Female ?    Is Non-Hispanic African American: No ?    Diabetic: Yes ?    Tobacco smoker: No ?    Systolic Blood Pressure: 355 mmHg ?    Is BP treated: Yes ?    HDL Cholesterol: 69.4 mg/dL ?    Total Cholesterol: 169 mg/dL   ? ? ?  10/30/2020  ? 10:23 AM 10/16/2020  ? 10:29 AM 09/14/2019  ? 10:45 AM  ?Depression screen PHQ 2/9  ?Decreased Interest 0 0 0  ?Down, Depressed, Hopeless 0 0 1  ?PHQ - 2 Score 0 0 1  ?Altered sleeping   1  ?Tired, decreased energy   0  ?Change in appetite   0  ?Feeling bad or failure about yourself    0  ?Trouble concentrating   0  ?Moving slowly or fidgety/restless   0  ?Suicidal thoughts   0  ?PHQ-9 Score   2  ?Difficult doing work/chores   Somewhat difficult  ?  ? ?CHA2DS2-VASc Score = 5  ?The patient's score is based upon: ?CHF History: 0 ?HTN History: 1 ?Diabetes History: 1 ?Stroke History: 0 ?Vascular Disease History: 0 ?Age Score: 2 ?Gender Score: 1 ?    ? ? ?Social History  ? ?Tobacco Use  ?Smoking Status Former  ? Types: Cigarettes  ? Quit date: 08/27/1984  ? Years since quitting: 37.0  ?Smokeless Tobacco Never  ?Tobacco Comments  ? smoked age [age], up to 1.5-2 ppd  ? ?BP Readings from Last 3 Encounters:  ?06/23/21 112/62  ?04/17/21 126/78  ?01/14/21 134/80  ? ?Pulse Readings from Last 3 Encounters:  ?06/23/21 62  ?04/17/21 71  ?01/14/21 63  ? ?Wt Readings from Last 3 Encounters:  ?06/23/21 174 lb (78.9 kg)  ?04/17/21 176 lb 3.2 oz (79.9 kg)  ?01/14/21 174 lb (78.9 kg)  ? ?BMI Readings from Last 3 Encounters:  ?06/23/21 28.96 kg/m?  ?04/17/21 29.32 kg/m?  ?01/14/21 28.96 kg/m?  ? ? ?Assessment/Interventions: Review of patient past medical history, allergies, medications, health status, including review of consultants reports, laboratory and other test data, was performed as part of comprehensive evaluation and provision of chronic care management services.  ? ?SDOH:  (Social  Determinants of Health) assessments and interventions performed: Yes ? ?SDOH Screenings  ? ?  Alcohol Screen: Low Risk   ? Last Alcohol Screening Score (AUDIT): 0  ?Depression (PHQ2-9): Low Risk   ? PHQ-2 Score: 0  ?Financial Resource Strain: Low Risk   ? Difficulty of Paying Living Expenses: Not hard at all  ?Food Insecurity: No Food Insecurity  ? Worried About Charity fundraiser in the Last Year: Never true  ? Ran Out of Food in the Last Year: Never true  ?Housing: Low Risk   ? Last Housing Risk Score: 0  ?Physical Activity: Inactive  ? Days of Exercise per Week: 0 days  ? Minutes of Exercise per Session: 0 min  ?Social Connections: Socially Integrated  ? Frequency of Communication with Friends and Family: More than three times a week  ? Frequency of Social Gatherings with Friends and Family: More than three times a week  ? Attends Religious Services: More than 4 times per year  ? Active Member of Clubs or Organizations: No  ? Attends Archivist Meetings: More than 4 times per year  ? Marital Status: Married  ?Stress: No Stress Concern Present  ? Feeling of Stress : Not at all  ?Tobacco Use: Medium Risk  ? Smoking Tobacco Use: Former  ? Smokeless Tobacco Use: Never  ? Passive Exposure: Not on file  ?Transportation Needs: No Transportation Needs  ? Lack of Transportation (Medical): No  ? Lack of Transportation (Non-Medical): No  ? ? ?CCM Care Plan ? ?Allergies  ?Allergen Reactions  ? Ibuprofen Other (See Comments)  ?  Dizziness "made me woozy"  ? ? ?Medications Reviewed Today   ? ? Reviewed by Tomasa Blase, [facility] (Pharmacist) on 08/29/21 at [place] List Status: <None>  ? ?Medication Order Taking? Sig Documenting Provider Last Dose Status Informant  ?acetaminophen (TYLENOL) 650 MG CR tablet [id number] Yes Take 650 mg by mouth every 8 (eight) hours as needed for pain. [provider] Taking Active   ?apixaban (ELIQUIS) 5 MG TABS tablet [id number] Yes Take 1 tablet (5 mg total) by mouth 2 (two)  times daily. Loel Dubonnet, NP Taking Active   ?atorvastatin (LIPITOR) 20 MG tablet [id number] Yes TAKE (1/2) TABLET DAILY. Skeet Latch, MD Taking Active   ?Cholecalciferol (VITAMIN D3 PO) [id number] Yes Take 1

## 2021-08-31 DIAGNOSIS — M1990 Unspecified osteoarthritis, unspecified site: Secondary | ICD-10-CM

## 2021-08-31 DIAGNOSIS — N1831 Chronic kidney disease, stage 3a: Secondary | ICD-10-CM

## 2021-08-31 DIAGNOSIS — E1159 Type 2 diabetes mellitus with other circulatory complications: Secondary | ICD-10-CM | POA: Diagnosis not present

## 2021-08-31 DIAGNOSIS — E1122 Type 2 diabetes mellitus with diabetic chronic kidney disease: Secondary | ICD-10-CM

## 2021-08-31 DIAGNOSIS — I4891 Unspecified atrial fibrillation: Secondary | ICD-10-CM

## 2021-08-31 DIAGNOSIS — E782 Mixed hyperlipidemia: Secondary | ICD-10-CM

## 2021-08-31 DIAGNOSIS — I129 Hypertensive chronic kidney disease with stage 1 through stage 4 chronic kidney disease, or unspecified chronic kidney disease: Secondary | ICD-10-CM

## 2021-09-16 DIAGNOSIS — C44311 Basal cell carcinoma of skin of nose: Secondary | ICD-10-CM | POA: Diagnosis not present

## 2021-09-16 DIAGNOSIS — C4491 Basal cell carcinoma of skin, unspecified: Secondary | ICD-10-CM | POA: Diagnosis not present

## 2021-09-16 DIAGNOSIS — Z481 Encounter for planned postprocedural wound closure: Secondary | ICD-10-CM | POA: Diagnosis not present

## 2021-10-10 ENCOUNTER — Encounter: Payer: Self-pay | Admitting: Gastroenterology

## 2021-10-19 NOTE — Patient Instructions (Addendum)
     Blood work was ordered.     Medications changes include :   none    Return in about 6 months (around 04/24/2022) for Physical Exam.

## 2021-10-19 NOTE — Progress Notes (Signed)
Subjective:    Patient ID: Sharon Knight, female    DOB: 1945/10/25, 76 y.o.   MRN: [id number]     HPI Sharon Knight is here for follow up of her chronic medical problems, including Afib, htn, DM, hld, CKD, knee pain - gout , OA  Needs handicap form. Uses cane, can not walk too far.    She is taking all of her medications as prescribed.    Medications and allergies reviewed with patient and updated if appropriate.  Current Outpatient Medications on File Prior to Visit  Medication Sig Dispense Refill   acetaminophen (TYLENOL) 650 MG CR tablet Take 650 mg by mouth every 8 (eight) hours as needed for pain.     apixaban (ELIQUIS) 5 MG TABS tablet Take 1 tablet (5 mg total) by mouth 2 (two) times daily. 60 tablet 5   atorvastatin (LIPITOR) 20 MG tablet TAKE (1/2) TABLET DAILY. 45 tablet 2   Cholecalciferol (VITAMIN D3 PO) Take 1,000 Int'l Units/day by mouth.     diltiazem (CARDIZEM CD) 180 MG 24 hr capsule Take 1 capsule (180 mg total) by mouth daily. 90 capsule 3   Docusate Sodium (STOOL SOFTENER LAXATIVE PO) Take 3-4 capsules by mouth daily.     flecainide (TAMBOCOR) 100 MG tablet TAKE 1 TABLET BY MOUTH TWICE DAILY. 180 tablet 3   hydrochlorothiazide (HYDRODIURIL) 25 MG tablet TAKE ONE TABLET DAILY 90 tablet 2   Misc Natural Products (TART CHERRY ADVANCED PO) Take 2 capsules by mouth 2 (two) times daily.     olmesartan (BENICAR) 5 MG tablet TAKE 1 TABLET ONCE DAILY. 90 tablet 3   No current facility-administered medications on file prior to visit.     Review of Systems  Constitutional:  Negative for fever.  Respiratory:  Negative for cough, shortness of breath and wheezing.   Cardiovascular:  Negative for chest pain, palpitations and leg swelling.  Musculoskeletal:  Positive for arthralgias.  Neurological:  Positive for dizziness. Negative for headaches.  Psychiatric/Behavioral:  Positive for sleep disturbance.        Objective:   Vitals:   10/23/21 0912  BP: 120/66   Pulse: 60  Temp: 98.4 F (36.9 C)  SpO2: 95%   BP Readings from Last 3 Encounters:  10/23/21 120/66  06/23/21 112/62  04/17/21 126/78   Wt Readings from Last 3 Encounters:  10/23/21 172 lb (78 kg)  06/23/21 174 lb (78.9 kg)  04/17/21 176 lb 3.2 oz (79.9 kg)   Body mass index is 28.62 kg/m.    Physical Exam Constitutional:      General: She is not in acute distress.    Appearance: Normal appearance.  HENT:     Head: Normocephalic and atraumatic.  Eyes:     Conjunctiva/sclera: Conjunctivae normal.  Cardiovascular:     Rate and Rhythm: Normal rate and regular rhythm.     Heart sounds: Normal heart sounds. No murmur heard. Pulmonary:     Effort: Pulmonary effort is normal. No respiratory distress.     Breath sounds: Normal breath sounds. No wheezing.  Musculoskeletal:     Cervical back: Neck supple.     Right lower leg: No edema.     Left lower leg: No edema.  Lymphadenopathy:     Cervical: No cervical adenopathy.  Skin:    General: Skin is warm and dry.     Findings: No rash.  Neurological:     Mental Status: She is alert. Mental status is at baseline.  Psychiatric:        Mood and Affect: Mood normal.        Behavior: Behavior normal.        Diabetic Foot Exam - Simple   Simple Foot Form Diabetic Foot exam was performed with the following findings: Yes 10/23/2021  9:37 AM  Visual Inspection No deformities, no ulcerations, no other skin breakdown bilaterally: Yes Sensation Testing See comments: Yes Pulse Check Posterior Tibialis and Dorsalis pulse intact bilaterally: Yes Comments Slightly decreased sensation plantar surfaces      Lab Results  Component Value Date   WBC 11.3 (H) 04/17/2021   HGB 13.5 04/17/2021   HCT 40.8 04/17/2021   PLT 359.0 04/17/2021   GLUCOSE 171 (H) 04/17/2021   CHOL 169 04/17/2021   TRIG 130.0 04/17/2021   HDL 69.40 04/17/2021   LDLCALC 73 04/17/2021   ALT 21 04/17/2021   AST 21 04/17/2021   NA 140 04/17/2021   K 4.3  04/17/2021   CL 101 04/17/2021   CREATININE 1.16 04/17/2021   BUN 23 04/17/2021   CO2 30 04/17/2021   TSH 2.85 04/17/2021   HGBA1C 7.2 (H) 04/17/2021   MICROALBUR <0.7 04/17/2021     Assessment & Plan:    See Problem List for Assessment and Plan of chronic medical problems.

## 2021-10-23 ENCOUNTER — Encounter: Payer: Self-pay | Admitting: Internal Medicine

## 2021-10-23 ENCOUNTER — Other Ambulatory Visit: Payer: Self-pay | Admitting: Cardiovascular Disease

## 2021-10-23 ENCOUNTER — Ambulatory Visit (INDEPENDENT_AMBULATORY_CARE_PROVIDER_SITE_OTHER): Payer: Medicare Other | Admitting: Internal Medicine

## 2021-10-23 VITALS — BP 120/66 | HR 60 | Temp 98.4°F | Ht 65.0 in | Wt 172.0 lb

## 2021-10-23 DIAGNOSIS — I10 Essential (primary) hypertension: Secondary | ICD-10-CM | POA: Diagnosis not present

## 2021-10-23 DIAGNOSIS — E119 Type 2 diabetes mellitus without complications: Secondary | ICD-10-CM

## 2021-10-23 DIAGNOSIS — I480 Paroxysmal atrial fibrillation: Secondary | ICD-10-CM

## 2021-10-23 DIAGNOSIS — N1831 Chronic kidney disease, stage 3a: Secondary | ICD-10-CM

## 2021-10-23 DIAGNOSIS — R42 Dizziness and giddiness: Secondary | ICD-10-CM

## 2021-10-23 DIAGNOSIS — M109 Gout, unspecified: Secondary | ICD-10-CM

## 2021-10-23 DIAGNOSIS — E782 Mixed hyperlipidemia: Secondary | ICD-10-CM

## 2021-10-23 DIAGNOSIS — I700 Atherosclerosis of aorta: Secondary | ICD-10-CM

## 2021-10-23 LAB — LIPID PANEL
Cholesterol: 171 mg/dL (ref 0–200)
HDL: 61.8 mg/dL (ref >39.00)
LDL Cholesterol: 81 mg/dL (ref 0–99)
NonHDL: 108.7
Total CHOL/HDL Ratio: 3
Triglycerides: 141 mg/dL (ref 0.0–149.0)
VLDL: 28.2 mg/dL (ref 0.0–40.0)

## 2021-10-23 LAB — COMPREHENSIVE METABOLIC PANEL
ALT: 20 U/L (ref 0–35)
AST: 21 U/L (ref 0–37)
Albumin: 4.3 g/dL (ref 3.5–5.2)
Alkaline Phosphatase: 90 U/L (ref 39–117)
BUN: 26 mg/dL — ABNORMAL HIGH (ref 6–23)
CO2: 31 mEq/L (ref 19–32)
Calcium: 10.2 mg/dL (ref 8.4–10.5)
Chloride: 104 mEq/L (ref 96–112)
Creatinine, Ser: 1.17 mg/dL (ref 0.40–1.20)
GFR: 45.08 mL/min — ABNORMAL LOW (ref >60.00)
Glucose, Bld: 145 mg/dL — ABNORMAL HIGH (ref 70–99)
Potassium: 4.5 mEq/L (ref 3.5–5.1)
Sodium: 142 mEq/L (ref 135–145)
Total Bilirubin: 0.4 mg/dL (ref 0.2–1.2)
Total Protein: 7.8 g/dL (ref 6.0–8.3)

## 2021-10-23 LAB — CBC WITH DIFFERENTIAL/PLATELET
Basophils Absolute: 0.1 10*3/uL (ref 0.0–0.1)
Basophils Relative: 1 % (ref 0.0–3.0)
Eosinophils Absolute: 0.5 10*3/uL (ref 0.0–0.7)
Eosinophils Relative: 4.7 % (ref 0.0–5.0)
HCT: 42.5 % (ref 36.0–46.0)
Hemoglobin: 14.1 g/dL (ref 12.0–15.0)
Lymphocytes Relative: 15.5 % (ref 12.0–46.0)
Lymphs Abs: 1.6 10*3/uL (ref 0.7–4.0)
MCHC: 33.1 g/dL (ref 30.0–36.0)
MCV: 92.6 fl (ref 78.0–100.0)
Monocytes Absolute: 1 10*3/uL (ref 0.1–1.0)
Monocytes Relative: 9.6 % (ref 3.0–12.0)
Neutro Abs: 7 10*3/uL (ref 1.4–7.7)
Neutrophils Relative %: 69.2 % (ref 43.0–77.0)
Platelets: 363 10*3/uL (ref 150.0–400.0)
RBC: 4.59 Mil/uL (ref 3.87–5.11)
RDW: 13.2 % (ref 11.5–15.5)
WBC: 10.1 10*3/uL (ref 4.0–10.5)

## 2021-10-23 LAB — URIC ACID: Uric Acid, Serum: 7.4 mg/dL — ABNORMAL HIGH (ref 2.4–7.0)

## 2021-10-23 LAB — HEMOGLOBIN A1C: Hgb A1c MFr Bld: 7.3 % — ABNORMAL HIGH (ref 4.6–6.5)

## 2021-10-23 NOTE — Assessment & Plan Note (Signed)
Chronic CMP 

## 2021-10-23 NOTE — Assessment & Plan Note (Signed)
Chronic Regular exercise and healthy diet encouraged Check lipid panel  Continue atorvastatin 10 mg daily 

## 2021-10-23 NOTE — Assessment & Plan Note (Signed)
Having intermittent BPPV Only occurs when laying down and getting into certain positions Trying to do maneuvers to help and they do help some

## 2021-10-23 NOTE — Assessment & Plan Note (Signed)
Chronic Asymptomatic In sinus rhythm here today On diltiazem, Eliquis and flecainide Follows with cardiology CBC, CMP

## 2021-10-23 NOTE — Telephone Encounter (Signed)
Rx request sent to pharmacy.  

## 2021-10-23 NOTE — Assessment & Plan Note (Signed)
Chronic  Lab Results  Component Value Date   HGBA1C 7.2 (H) 04/17/2021   Sugars not ideally controlled Check A1c  Continue lifestyle control, but if A1c is still higher than discuss starting medication-ideally [facility] Glade Stressed regular exercise, diabetic diet

## 2021-10-23 NOTE — Assessment & Plan Note (Addendum)
H/o gout Ck uric acid level No gout symptoms Was on allopurinol in past - ? Need to restart

## 2021-10-23 NOTE — Assessment & Plan Note (Signed)
Chronic Continue atorvastatin 10 mg daily Encouraged healthy diet, regular exercise On Eliquis 5 mg twice daily secondary to paroxysmal A-fib

## 2021-10-31 ENCOUNTER — Telehealth: Payer: Self-pay

## 2021-10-31 NOTE — Telephone Encounter (Signed)
Pt is return call I advised the pt of the result per Dr. [name]. Pt states that she would like to hold off on taking allopurinol. Pt states that she has made some diet changes and it has decrease since the last labs.   Pt states that she has made some lifestyle changes and would like to see if the lab values will decrease in January when its checked another. Pt will reconsider if those the values are not decreasing with the lifestyle changes.  Pt states she only has one kidney and is was wondering if that has anything to do with the decrease in the kidney function.   Please advise

## 2021-11-03 NOTE — Telephone Encounter (Signed)
Patient called back informed her of provider recommendations

## 2021-11-03 NOTE — Telephone Encounter (Signed)
Called patient and left VM for patient to call office for provider recommendations

## 2021-11-03 NOTE — Telephone Encounter (Signed)
Her kidney function is stable which is the most important thing.  She should continue to drink plenty of water and avoid nsaids ( advil, aleve).   We will monitor this every 6 months.

## 2021-11-11 ENCOUNTER — Ambulatory Visit (INDEPENDENT_AMBULATORY_CARE_PROVIDER_SITE_OTHER): Payer: Medicare Other

## 2021-11-11 DIAGNOSIS — Z0000 Encounter for general adult medical examination without abnormal findings: Secondary | ICD-10-CM | POA: Diagnosis not present

## 2021-11-11 NOTE — Progress Notes (Signed)
I connected with Sharon Knight today by telephone and verified that I am speaking with the correct person using two identifiers. Location patient: home Location provider: work Persons participating in the virtual visit: patient, provider.   I discussed the limitations, risks, security and privacy concerns of performing an evaluation and management service by telephone and the availability of in person appointments. I also discussed with the patient that there may be a patient responsible charge related to this service. The patient expressed understanding and verbally consented to this telephonic visit.    Interactive audio and video telecommunications were attempted between this provider and patient, however failed, due to patient having technical difficulties OR patient did not have access to video capability.  We continued and completed visit with audio only.  Some vital signs may be absent or patient reported.   Time Spent with patient on telephone encounter: 30 minutes  Subjective:   Sharon Knight is a 76 y.o. female who presents for Medicare Annual (Subsequent) preventive examination.  Review of Systems     Cardiac Risk Factors include: advanced age (>5mn, >>64women);dyslipidemia;hypertension;sedentary lifestyle     Objective:    There were no vitals filed for this visit. There is no height or weight on file to calculate BMI.     11/11/2021    2:22 PM 10/30/2020   10:20 AM 09/14/2019   10:27 AM 05/24/2018   11:49 AM 12/10/2017    4:26 PM 12/10/2017    1:48 PM 02/19/2017   10:30 AM  Advanced Directives  Does Patient Have a Medical Advance Directive? No No No No No No No  Would patient like information on creating a medical advance directive? No - Patient declined No - Patient declined No - Patient declined Yes (MAU/Ambulatory/Procedural Areas - Information given) No - Patient declined  No - Patient declined    Current Medications (verified) Outpatient Encounter Medications  as of 11/11/2021  Medication Sig   acetaminophen (TYLENOL) 650 MG CR tablet Take 650 mg by mouth every 8 (eight) hours as needed for pain.   apixaban (ELIQUIS) 5 MG TABS tablet Take 1 tablet (5 mg total) by mouth 2 (two) times daily.   atorvastatin (LIPITOR) 20 MG tablet TAKE (1/2) TABLET DAILY.   Cholecalciferol (VITAMIN D3 PO) Take 1,000 Int'l Units/day by mouth.   diltiazem (CARDIZEM CD) 180 MG 24 hr capsule Take 1 capsule (180 mg total) by mouth daily.   Docusate Sodium (STOOL SOFTENER LAXATIVE PO) Take 3-4 capsules by mouth daily.   flecainide (TAMBOCOR) 100 MG tablet TAKE 1 TABLET BY MOUTH TWICE DAILY.   hydrochlorothiazide (HYDRODIURIL) 25 MG tablet TAKE ONE TABLET BY MOUTH ONCE DAILY   Misc Natural Products (TART CHERRY ADVANCED PO) Take 2 capsules by mouth 2 (two) times daily.   olmesartan (BENICAR) 5 MG tablet TAKE 1 TABLET ONCE DAILY.   No facility-administered encounter medications on file as of 11/11/2021.    Allergies (verified) Ibuprofen   History: Past Medical History:  Diagnosis Date   Arthritis    Bilateral cataracts    Dr SKathrin Penner  Exertional dyspnea 12/19/2020   Hyperlipidemia    Hypertension    Past Surgical History:  Procedure Laterality Date   COLONOSCOPY     Dr SFuller Plan  NEPHRECTOMY     right; for cyst on kidney   OVARIAN CYST REMOVAL     Family History  Adopted: Yes  Problem Relation Age of Onset   Colon cancer Neg Hx    Stomach cancer  Neg Hx    Breast cancer Neg Hx    Social History   Socioeconomic History   Marital status: Married    Spouse name: Not on file   Number of children: Not on file   Years of education: Not on file   Highest education level: Not on file  Occupational History   Not on file  Tobacco Use   Smoking status: Former    Types: Cigarettes    Quit date: 08/27/1984    Years since quitting: 37.2   Smokeless tobacco: Never   Tobacco comments:    smoked age [age], up to 1.5-2 ppd  Vaping Use   Vaping Use: Never used   Substance and Sexual Activity   Alcohol use: Yes    Alcohol/week: 3.0 standard drinks of alcohol    Types: 3 Cans of beer per week    Comment: 3 x weekly   Drug use: No   Sexual activity: Not on file  Other Topics Concern   Not on file  Social History Narrative   Exercise: none   Social Determinants of Health   Financial Resource Strain: Low Risk  (11/11/2021)   Overall Financial Resource Strain (CARDIA)    Difficulty of Paying Living Expenses: Not hard at all  Food Insecurity: No Food Insecurity (11/11/2021)   Hunger Vital Sign    Worried About Running Out of Food in the Last Year: Never true    Ran Out of Food in the Last Year: Never true  Transportation Needs: No Transportation Needs (11/11/2021)   PRAPARE - Hydrologist (Medical): No    Lack of Transportation (Non-Medical): No  Physical Activity: Inactive (11/11/2021)   Exercise Vital Sign    Days of Exercise per Week: 0 days    Minutes of Exercise per Session: 0 min  Stress: No Stress Concern Present (11/11/2021)   [place]    Feeling of Stress : Not at all  Social Connections: [place] (11/11/2021)   Social Connection and Isolation Panel [NHANES]    Frequency of Communication with Friends and Family: More than three times a week    Frequency of Social Gatherings with Friends and Family: More than three times a week    Attends Religious Services: More than 4 times per year    Active Member of [employer] or Organizations: No    Attends Music therapist: More than 4 times per year    Marital Status: Married    Tobacco Counseling Counseling given: Not Answered Tobacco comments: smoked age [age], up to 1.5-2 ppd   Clinical Intake:  Pre-visit preparation completed: Yes  Pain : No/denies pain     BMI - recorded: 28.62 Nutritional Status: BMI 25 -29 Overweight Nutritional Risks: None Diabetes:  No  How often do you need to have someone help you when you read instructions, pamphlets, or other written materials from your doctor or pharmacy?: 1 - Never What is the last grade level you completed in school?: HSG  Diabetic? no  Interpreter Needed?: No  Information entered by :: Lisette Abu, LPN.   Activities of Daily Living    11/11/2021    2:26 PM  In your present state of health, do you have any difficulty performing the following activities:  Hearing? 1  Vision? 0  Difficulty concentrating or making decisions? 1  Walking or climbing stairs? 0  Dressing or bathing? 0  Doing errands, shopping? 0  Preparing  Food and eating ? N  Using the Toilet? N  In the past six months, have you accidently leaked urine? Y  Do you have problems with loss of bowel control? N  Managing your Medications? N  Managing your Finances? N  Housekeeping or managing your Housekeeping? N    Patient Care Team: Binnie Rail, MD as PCP - General (Internal Medicine) Skeet Latch, MD as PCP - Cardiology (Cardiology) Constance Haw, MD as PCP - Electrophysiology (Cardiology) Jalene Mullet, MD as Consulting Physician (Ophthalmology) Opthamology, [facility] (Ophthalmology) Delice Bison, Darnelle Maffucci, [facility] (Inactive) (Pharmacist)  Indicate any recent Medical Services you may have received from other than [facility] providers in the past year (date may be approximate).     Assessment:   This is a routine wellness examination for [place].  Hearing/Vision screen Hearing Screening - Comments:: Patient has hearing difficulty and does not wear hearing aids. Vision Screening - Comments:: Patient does wear readers.  Eye exam done by: [facility]   Dietary issues and exercise activities discussed: Current Exercise Habits: The patient does not participate in regular exercise at present, Exercise limited by: None identified   Goals Addressed   None   Depression Screen    11/11/2021     2:25 PM 10/30/2020   10:23 AM 10/16/2020   10:29 AM 09/14/2019   10:45 AM 03/17/2019   10:59 AM 09/15/2018   11:09 AM 03/17/2018    9:53 AM  PHQ 2/9 Scores  PHQ - 2 Score 0 0 0 1 0 0 0  PHQ- 9 Score    2       Fall Risk    11/11/2021    2:24 PM 10/30/2020   10:22 AM 09/16/2020    2:14 PM 09/14/2019   10:31 AM 03/17/2019   10:59 AM  Fall Risk   Falls in the past year? 0 0 0 1 0  Number falls in past yr: 0 0 0 0 0  Injury with Fall? 0 0 0 0   Risk for fall due to : No Fall Risks No Fall Risks No Fall Risks Impaired balance/gait;Impaired vision   Follow up Falls evaluation completed Falls evaluation completed Falls evaluation completed Falls evaluation completed;Education provided;Falls prevention discussed     FALL RISK PREVENTION PERTAINING TO THE HOME:  Any stairs in or around the home? No  If so, are there any without handrails? No  Home free of loose throw rugs in walkways, pet beds, electrical cords, etc? Yes  Adequate lighting in your home to reduce risk of falls? Yes   ASSISTIVE DEVICES UTILIZED TO PREVENT FALLS:  Life alert? No  Use of a cane, walker or w/c? No  Grab bars in the bathroom? No  Shower chair or bench in shower? Yes  Elevated toilet seat or a handicapped toilet? Yes   TIMED UP AND GO:  Was the test performed? No .  Length of time to ambulate 10 feet: n/a sec.   Appearance of gait: Gait not evaluated during this visit.  Cognitive Function:    02/19/2017   11:07 AM  MMSE - Mini Mental State Exam  Orientation to time 5  Orientation to Place 5  Registration 3  Attention/ Calculation 4  Recall 2  Language- name 2 objects 2  Language- repeat 1  Language- follow 3 step command 3  Language- read & follow direction 1  Write a sentence 1  Copy design 1  Total score 28        09/14/2019  10:42 AM  6CIT Screen  What Year? 0 points  What month? 0 points  What time? 0 points  Count back from 20 0 points  Months in reverse 0 points  Repeat  phrase 0 points  Total Score 0 points    Immunizations Immunization History  Administered Date(s) Administered   Fluad Quad(high Dose 65+) 01/21/2019, 02/01/2020, 02/05/2021   Influenza Split 02/18/2011, 02/18/2012   Influenza Whole 03/02/2007, 02/03/2008, 02/08/2009, 02/25/2010   Influenza, High Dose Seasonal PF 02/14/2013, 02/05/2016, 02/03/2017, 02/15/2018   Influenza,inj,Quad PF,6+ Mos 02/20/2014, 02/11/2015   Influenza-Unspecified 02/02/2016   Moderna Covid-19 Vaccine Bivalent Booster 39yr & up 04/15/2021   PFIZER(Purple Top)SARS-COV-2 Vaccination 07/15/2019, 08/09/2019, 04/04/2020, 11/21/2020   Pneumococcal Conjugate-13 02/12/2015   Pneumococcal Polysaccharide-23 02/19/2017   Td 03/03/2010   Zoster Recombinat (Shingrix) 02/28/2020, 07/16/2020   Zoster, Live 11/02/2012    TDAP status: Due, Education has been provided regarding the importance of this vaccine. Advised may receive this vaccine at local pharmacy or Health Dept. Aware to provide a copy of the vaccination record if obtained from local pharmacy or Health Dept. Verbalized acceptance and understanding.  Flu Vaccine status: Up to date  Pneumococcal vaccine status: Up to date  Covid-19 vaccine status: Completed vaccines  Qualifies for Shingles Vaccine? Yes   Zostavax completed Yes   Shingrix Completed?: Yes  Screening Tests Health Maintenance  Topic Date Due   TETANUS/TDAP  03/03/2020   COVID-19 Vaccine (6 - Pfizer series) 08/14/2021   DEXA SCAN  04/17/2022 (Originally 07/02/2017)   INFLUENZA VACCINE  12/02/2021   Diabetic kidney evaluation - Urine ACR  04/17/2022   HEMOGLOBIN A1C  04/24/2022   OPHTHALMOLOGY EXAM  07/23/2022   Diabetic kidney evaluation - GFR measurement  10/24/2022   FOOT EXAM  10/24/2022   Pneumonia Vaccine 76 Years old  Completed   Hepatitis C Screening  Completed   Zoster Vaccines- Shingrix  Completed   HPV VACCINES  Aged Out   COLONOSCOPY (Pts [phone] coverage will need to  be confirmed)  Discontinued    Health Maintenance  Health Maintenance Due  Topic Date Due   TETANUS/TDAP  03/03/2020   COVID-19 Vaccine (6 - Pfizer series) 08/14/2021    Colorectal cancer screening: No longer required.   Mammogram status: Completed 03/06/2021. Repeat every year  Bone Density status: Completed 07/03/2015. Results reflect: Bone density results: OSTEOPENIA. Repeat every 2-3 years.  Lung Cancer Screening: (Low Dose CT Chest recommended if Age 77-80ears, 30 pack-year currently smoking OR have quit w/in 15years.) does not qualify.   Lung Cancer Screening Referral: no  Additional Screening:  Hepatitis C Screening: does qualify; Completed 02/05/2016  Vision Screening: Recommended annual ophthalmology exams for early detection of glaucoma and other disorders of the eye. Is the patient up to date with their annual eye exam?  Yes  Who is the provider or what is the name of the office in which the patient attends [facility] If pt is not established with a provider, would they like to be referred to a provider to establish care? No .   Dental Screening: Recommended annual dental exams for proper oral hygiene  Community Resource Referral / Chronic Care Management: CRR required this visit?  No   CCM required this visit?  No      Plan:     I have personally reviewed and noted the following in the patient's chart:   Medical and social history Use of alcohol, tobacco or illicit drugs  Current  medications and supplements including opioid prescriptions.  Functional ability and status Nutritional status Physical activity Advanced directives List of other physicians Hospitalizations, surgeries, and ER visits in previous 12 months Vitals Screenings to include cognitive, depression, and falls Referrals and appointments  In addition, I have reviewed and discussed with patient certain preventive protocols, quality metrics, and best  practice recommendations. A written personalized care plan for preventive services as well as general preventive health recommendations were provided to patient.     Sheral Flow, LPN   07/24/5670   Nurse Notes:  Patient is cogitatively intact. There were no vitals filed for this visit. There is no height or weight on file to calculate BMI.

## 2021-11-11 NOTE — Patient Instructions (Signed)
Ms. Doutt , Thank you for taking time to come for your Medicare Wellness Visit. I appreciate your ongoing commitment to your health goals. Please review the following plan we discussed and let me know if I can assist you in the future.   Screening recommendations/referrals: Colonoscopy: Discontinued due to age 76: 03/06/2021; due every year Bone Density: 07/03/2015; due every 2-3 years Recommended yearly ophthalmology/optometry visit for glaucoma screening and checkup Recommended yearly dental visit for hygiene and checkup  Vaccinations: Influenza vaccine: 02/05/2021 Pneumococcal vaccine: 02/12/2015, 02/19/2017 Tdap vaccine: 03/03/2010; due every 10 years (overdue) Shingles vaccine: 02/28/2020, 07/16/2020   Covid-19: 07/15/2019, 08/09/2019, 04/04/2020, 11/21/2020, 04/15/2021  Advanced directives: No  Conditions/risks identified: Yes  Next appointment: Please schedule your next Medicare Wellness Visit with your Nurse Health Advisor in 1 year by calling [phone].   Preventive Care 63 Years and Older, Female Preventive care refers to lifestyle choices and visits with your health care provider that can promote health and wellness. What does preventive care include? A yearly physical exam. This is also called an annual well check. Dental exams once or twice a year. Routine eye exams. Ask your health care provider how often you should have your eyes checked. Personal lifestyle choices, including: Daily care of your teeth and gums. Regular physical activity. Eating a healthy diet. Avoiding tobacco and drug use. Limiting alcohol use. Practicing safe sex. Taking low-dose aspirin every day. Taking vitamin and mineral supplements as recommended by your health care provider. What happens during an annual well check? The services and screenings done by your health care provider during your annual well check will depend on your age, overall health, lifestyle risk factors, and family  history of disease. Counseling  Your health care provider may ask you questions about your: Alcohol use. Tobacco use. Drug use. Emotional well-being. Home and relationship well-being. Sexual activity. Eating habits. History of falls. Memory and ability to understand (cognition). Work and work Statistician. Reproductive health. Screening  You may have the following tests or measurements: Height, weight, and BMI. Blood pressure. Lipid and cholesterol levels. These may be checked every 5 years, or more frequently if you are over 43 years old. Skin check. Lung cancer screening. You may have this screening every year starting at age 69 if you have a 30-pack-year history of smoking and currently smoke or have quit within the past 15 years. Fecal occult blood test (FOBT) of the stool. You may have this test every year starting at age 28. Flexible sigmoidoscopy or colonoscopy. You may have a sigmoidoscopy every 5 years or a colonoscopy every 10 years starting at age 43. Hepatitis C blood test. Hepatitis B blood test. Sexually transmitted disease (STD) testing. Diabetes screening. This is done by checking your blood sugar (glucose) after you have not eaten for a while (fasting). You may have this done every 1-3 years. Bone density scan. This is done to screen for osteoporosis. You may have this done starting at age 65. Mammogram. This may be done every 1-2 years. Talk to your health care provider about how often you should have regular mammograms. Talk with your health care provider about your test results, treatment options, and if necessary, the need for more tests. Vaccines  Your health care provider may recommend certain vaccines, such as: Influenza vaccine. This is recommended every year. Tetanus, diphtheria, and acellular pertussis (Tdap, Td) vaccine. You may need a Td booster every 10 years. Zoster vaccine. You may need this after age 17. Pneumococcal 13-valent conjugate (PCV13)  vaccine.  One dose is recommended after age 44. Pneumococcal polysaccharide (PPSV23) vaccine. One dose is recommended after age 106. Talk to your health care provider about which screenings and vaccines you need and how often you need them. This information is not intended to replace advice given to you by your health care provider. Make sure you discuss any questions you have with your health care provider. Document Released: 05/17/2015 Document Revised: 01/08/2016 Document Reviewed: 02/19/2015 Elsevier Interactive Patient Education  2017 [place] Prevention in the Home Falls can cause injuries. They can happen to people of all ages. There are many things you can do to make your home safe and to help prevent falls. What can I do on the outside of my home? Regularly fix the edges of walkways and driveways and fix any cracks. Remove anything that might make you trip as you walk through a door, such as a raised step or threshold. Trim any bushes or trees on the path to your home. Use bright outdoor lighting. Clear any walking paths of anything that might make someone trip, such as rocks or tools. Regularly check to see if handrails are loose or broken. Make sure that both sides of any steps have handrails. Any raised decks and porches should have guardrails on the edges. Have any leaves, snow, or ice cleared regularly. Use sand or salt on walking paths during winter. Clean up any spills in your garage right away. This includes oil or grease spills. What can I do in the bathroom? Use night lights. Install grab bars by the toilet and in the tub and shower. Do not use towel bars as grab bars. Use non-skid mats or decals in the tub or shower. If you need to sit down in the shower, use a plastic, non-slip stool. Keep the floor dry. Clean up any water that spills on the floor as soon as it happens. Remove soap buildup in the tub or shower regularly. Attach bath mats securely with  double-sided non-slip rug tape. Do not have throw rugs and other things on the floor that can make you trip. What can I do in the bedroom? Use night lights. Make sure that you have a light by your bed that is easy to reach. Do not use any sheets or blankets that are too big for your bed. They should not hang down onto the floor. Have a firm chair that has side arms. You can use this for support while you get dressed. Do not have throw rugs and other things on the floor that can make you trip. What can I do in the kitchen? Clean up any spills right away. Avoid walking on wet floors. Keep items that you use a lot in easy-to-reach places. If you need to reach something above you, use a strong step stool that has a grab bar. Keep electrical cords out of the way. Do not use floor polish or wax that makes floors slippery. If you must use wax, use non-skid floor wax. Do not have throw rugs and other things on the floor that can make you trip. What can I do with my stairs? Do not leave any items on the stairs. Make sure that there are handrails on both sides of the stairs and use them. Fix handrails that are broken or loose. Make sure that handrails are as long as the stairways. Check any carpeting to make sure that it is firmly attached to the stairs. Fix any carpet that is loose or worn.  Avoid having throw rugs at the top or bottom of the stairs. If you do have throw rugs, attach them to the floor with carpet tape. Make sure that you have a light switch at the top of the stairs and the bottom of the stairs. If you do not have them, ask someone to add them for you. What else can I do to help prevent falls? Wear shoes that: Do not have high heels. Have rubber bottoms. Are comfortable and fit you well. Are closed at the toe. Do not wear sandals. If you use a stepladder: Make sure that it is fully opened. Do not climb a closed stepladder. Make sure that both sides of the stepladder are locked  into place. Ask someone to hold it for you, if possible. Clearly mark and make sure that you can see: Any grab bars or handrails. First and last steps. Where the edge of each step is. Use tools that help you move around (mobility aids) if they are needed. These include: Canes. Walkers. Scooters. Crutches. Turn on the lights when you go into a dark area. Replace any light bulbs as soon as they burn out. Set up your furniture so you have a clear path. Avoid moving your furniture around. If any of your floors are uneven, fix them. If there are any pets around you, be aware of where they are. Review your medicines with your doctor. Some medicines can make you feel dizzy. This can increase your chance of falling. Ask your doctor what other things that you can do to help prevent falls. This information is not intended to replace advice given to you by your health care provider. Make sure you discuss any questions you have with your health care provider. Document Released: 02/14/2009 Document Revised: 09/26/2015 Document Reviewed: 05/25/2014 Elsevier Interactive Patient Education  2017 [employer].

## 2021-11-14 DIAGNOSIS — E113212 Type 2 diabetes mellitus with mild nonproliferative diabetic retinopathy with macular edema, left eye: Secondary | ICD-10-CM | POA: Diagnosis not present

## 2021-11-14 DIAGNOSIS — E113291 Type 2 diabetes mellitus with mild nonproliferative diabetic retinopathy without macular edema, right eye: Secondary | ICD-10-CM | POA: Diagnosis not present

## 2021-11-14 DIAGNOSIS — H35372 Puckering of macula, left eye: Secondary | ICD-10-CM | POA: Diagnosis not present

## 2021-11-14 DIAGNOSIS — H43812 Vitreous degeneration, left eye: Secondary | ICD-10-CM | POA: Diagnosis not present

## 2021-12-09 ENCOUNTER — Other Ambulatory Visit: Payer: Self-pay | Admitting: Cardiovascular Disease

## 2021-12-09 NOTE — Telephone Encounter (Signed)
Rx(s) sent to pharmacy electronically.  

## 2021-12-23 ENCOUNTER — Other Ambulatory Visit (HOSPITAL_BASED_OUTPATIENT_CLINIC_OR_DEPARTMENT_OTHER): Payer: Self-pay | Admitting: Cardiovascular Disease

## 2021-12-23 ENCOUNTER — Other Ambulatory Visit (HOSPITAL_BASED_OUTPATIENT_CLINIC_OR_DEPARTMENT_OTHER): Payer: Self-pay | Admitting: Family

## 2021-12-23 DIAGNOSIS — D6859 Other primary thrombophilia: Secondary | ICD-10-CM

## 2021-12-23 NOTE — Telephone Encounter (Signed)
Rx(s) sent to pharmacy electronically.  

## 2022-01-08 ENCOUNTER — Other Ambulatory Visit (HOSPITAL_BASED_OUTPATIENT_CLINIC_OR_DEPARTMENT_OTHER): Payer: Self-pay | Admitting: Cardiovascular Disease

## 2022-01-08 NOTE — Telephone Encounter (Signed)
Rx request sent to pharmacy.  

## 2022-01-15 NOTE — Progress Notes (Unsigned)
I, Sharon Knight, LAT, ATC acting as a scribe for Sharon Leader, MD.  Sharon Knight is a 76 y.o. female who presents to [place] [facility] today for cont'd L knee pain. Pt has had gout flares in the past but decline to take allopurinol and instead adjusted her diet and has been taking tart cherry extract. Pt was last seen by Dr. Georgina Knight on 01/14/21 and was advised to cont HEP and advance exercise and activity as tolerated. Today, pt reports that her pain increases with weight bearing and squatting. Pain in anterior aspect of knee. Feels like injection last September was helpful. Has been trying to stay on gout diet. Is no longer taking allopurinol.   Dx testing: 10/23/21 Uric acid = 7.4 09/16/20 L knee XR 04/17/21 Uric acid = 8.4  10/14/20 Uric acid = 7.3  Pertinent review of systems: No fevers or chills  Relevant historical information: Diabetes   Exam:  BP 108/62   Pulse 67   Ht '5\' 5"'$  (1.651 m)   Wt 170 lb (77.1 kg)   SpO2 99%   BMI 28.29 kg/m  General: Well Developed, well nourished, and in no acute distress.   MSK: Left knee mild effusion decreased range of motion pain with flexion and extension.  Significant antalgic gait using a quad cane to ambulate.    Lab and Radiology Results  Procedure: Real-time Ultrasound Guided Injection of left knee superior lateral patellar space Device: Philips Affiniti 50G Images permanently stored and available for review in PACS Verbal informed consent obtained.  Discussed risks and benefits of procedure. Warned about infection, bleeding, hyperglycemia damage to structures among others. Patient expresses understanding and agreement Time-out conducted.   Noted no overlying erythema, induration, or other signs of local infection.   Skin prepped in a sterile fashion.   Local anesthesia: Topical Ethyl chloride.   With sterile technique and under real time ultrasound guidance: 40 mg of Kenalog and 2 mL of Marcaine injected  into knee joint. Fluid seen entering the joint capsule.   Completed without difficulty   Pain immediately resolved suggesting accurate placement of the medication.   Advised to call if fevers/chills, erythema, induration, drainage, or persistent bleeding.   Images permanently stored and available for review in the ultrasound unit.  Impression: Technically successful ultrasound guided injection.   X-ray images left knee obtained today personally and independently interpreted Severe medial compartment DJD.  Severe patellofemoral DJD.  Chondrocalcinosis present in the knee joint. Await formal radiology review      Assessment and Plan: 76 y.o. female with significant knee pain secondary to DJD.  She is ambulating very poorly and exam today which is concerning and increasing her risk for falls per my interpretation.  Plan for steroid injection but I think she is reaching the time for knee replacement.  She is not quite ready to hear this news.  Plan refer to physical therapy for gait and fall prevention.   PDMP not reviewed this encounter. Orders Placed This Encounter  Procedures   [place] LIMITED JOINT SPACE STRUCTURES LOW LEFT(NO LINKED CHARGES)    Order Specific Question:   Reason for Exam (SYMPTOM  OR DIAGNOSIS REQUIRED)    Answer:   left knee pain    Order Specific Question:   Preferred imaging location?    Answer:   [place]   DG Knee AP/LAT W/[place] Left    Standing Status:   Future    Number of Occurrences:  1    Standing Expiration Date:   01/17/2023    Order Specific Question:   Reason for Exam (SYMPTOM  OR DIAGNOSIS REQUIRED)    Answer:   L knee pain    Order Specific Question:   Preferred imaging location?    Answer:   [facility]   Ambulatory referral to Physical Therapy    Referral Priority:   Routine    Referral Type:   Physical Medicine    Referral Reason:   Specialty Services Required    Requested Specialty:   Physical Therapy     Number of Visits Requested:   1   No orders of the defined types were placed in this encounter.    Discussed warning signs or symptoms. Please see discharge instructions. Patient expresses understanding.   The above documentation has been reviewed and is accurate and complete Sharon Knight, M.D.

## 2022-01-16 ENCOUNTER — Ambulatory Visit: Payer: Self-pay

## 2022-01-16 ENCOUNTER — Ambulatory Visit: Payer: Medicare Other | Admitting: Family Medicine

## 2022-01-16 ENCOUNTER — Ambulatory Visit (INDEPENDENT_AMBULATORY_CARE_PROVIDER_SITE_OTHER): Payer: Medicare Other

## 2022-01-16 ENCOUNTER — Encounter: Payer: Self-pay | Admitting: Family Medicine

## 2022-01-16 VITALS — BP 108/62 | HR 67 | Ht 65.0 in | Wt 170.0 lb

## 2022-01-16 DIAGNOSIS — M25562 Pain in left knee: Secondary | ICD-10-CM

## 2022-01-16 DIAGNOSIS — G8929 Other chronic pain: Secondary | ICD-10-CM | POA: Diagnosis not present

## 2022-01-16 NOTE — Patient Instructions (Signed)
PT [address] will call you Xray today See me in 6 weeks

## 2022-01-19 NOTE — Progress Notes (Signed)
Left knee x-ray shows medium to severe arthritis.

## 2022-01-21 ENCOUNTER — Ambulatory Visit: Payer: Medicare Other | Attending: Family Medicine | Admitting: Physical Therapy

## 2022-01-21 ENCOUNTER — Ambulatory Visit: Payer: Medicare Other

## 2022-01-21 ENCOUNTER — Encounter: Payer: Self-pay | Admitting: Physical Therapy

## 2022-01-21 DIAGNOSIS — M6281 Muscle weakness (generalized): Secondary | ICD-10-CM | POA: Insufficient documentation

## 2022-01-21 DIAGNOSIS — R2681 Unsteadiness on feet: Secondary | ICD-10-CM | POA: Diagnosis not present

## 2022-01-21 DIAGNOSIS — M25562 Pain in left knee: Secondary | ICD-10-CM | POA: Insufficient documentation

## 2022-01-21 DIAGNOSIS — G8929 Other chronic pain: Secondary | ICD-10-CM | POA: Insufficient documentation

## 2022-01-21 DIAGNOSIS — M25661 Stiffness of right knee, not elsewhere classified: Secondary | ICD-10-CM | POA: Diagnosis not present

## 2022-01-21 DIAGNOSIS — M25662 Stiffness of left knee, not elsewhere classified: Secondary | ICD-10-CM | POA: Insufficient documentation

## 2022-01-21 NOTE — Therapy (Signed)
OUTPATIENT PHYSICAL THERAPY LOWER EXTREMITY EVALUATION   Patient Name: Sharon Knight MRN: [id number] DOB:1945-06-03, 76 y.o., female Today's Date: 01/21/2022   PT End of Session - 01/21/22 1506     Visit Number 1    Number of Visits 16    Date for PT Re-Evaluation 03/18/22    Authorization Type BCBS    PT Start Time 1503    PT Stop Time 1545    PT Time Calculation (min) 42 min    Activity Tolerance Patient tolerated treatment well    Behavior During Therapy [facility] for tasks assessed/performed             Past Medical History:  Diagnosis Date   Arthritis    Bilateral cataracts    Dr Kathrin Penner   Exertional dyspnea 12/19/2020   Hyperlipidemia    Hypertension    Past Surgical History:  Procedure Laterality Date   COLONOSCOPY     Dr Fuller Plan   NEPHRECTOMY     right; for cyst on kidney   OVARIAN CYST REMOVAL     Patient Active Problem List   Diagnosis Date Noted   Vertigo 10/23/2021   Exertional dyspnea 12/19/2020   Aortic atherosclerosis ([place]) 10/16/2020   Acute pain of left knee 09/16/2020   Trigger finger, right ring finger 03/17/2019   CKD stage 3, 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 [name]  REFERRING DIAG: [phone] (ICD-10-CM) - Chronic pain of left knee   THERAPY DIAG:  Chronic pain of left knee  Stiffness of left knee, not elsewhere classified  Stiffness of right knee, not elsewhere classified  Muscle weakness  (generalized)  Gait instability  Rationale for Evaluation and Treatment Rehabilitation  ONSET DATE: chronic   SUBJECTIVE:   SUBJECTIVE STATEMENT: Pt with chronic knee pain which began to bother her again and she notices progressively limited functional mobility.  She is someone who is always busy and doing for other people. She is frustrated with her knee pain and weakness.  She reports having no stamina even to stand in line or walk and do her home tasks.  She has to take her husband to his appts and he has limited capacity to do home tasks.  She walks with a cane as her knee will give way at times. She did have another injection with Dr. Georgina Snell last week and it helped. She had PT in 2020 with good success but COVID imposed a great deal of stress on her and her family.   PERTINENT HISTORY:M25.562,G89.29 (ICD-10-CM) - Chronic pain of left knee   Gout, HTN, Diabetes, chronic knee pain, multi-factorial stress, Vertigo occ. , unsteady gait    PAIN:  Are you having pain? Yes: NPRS scale: 2/10 Pain location: L knee  Pain description: sore  Aggravating factors: standing , walking  Relieving factors: injection,   PRECAUTIONS: None  WEIGHT BEARING RESTRICTIONS No  FALLS:  Has patient fallen in last 6 months? No  LIVING ENVIRONMENT: Lives with: lives with  their spouse Lives in: House/apartment Stairs: No has about 3 up to the porch, has to go slow and rails but they're "falling apart" Has following equipment at home: Quad cane small base  OCCUPATION:   Office work    PLOF: Independent with basic ADLs, Independent with household mobility without device, Independent with community mobility with device, and Leisure: has a close friend, cats, married   PATIENT GOALS Pt would like to have some more stamina.  I want to feel more confident with walking.    OBJECTIVE:   DIAGNOSTIC FINDINGS:    MSK: Left knee mild effusion decreased range of motion pain with flexion and extension.   XR : 1. Moderate-to-severe tricompartmental degenerative changes, most pronounced in the medial and patellofemoral compartments. 2. Trace suprapatellar joint effusion. 3. Chondrocalcinosis.  PATIENT SURVEYS:  FOTO 48%  COGNITION:  Overall cognitive status: Within functional limits for tasks assessed     SENSATION: WFL  EDEMA:  Circumferential: 16 3/8 inch each knee   MUSCLE LENGTH: Hamstrings: tight Thomas test: tight   POSTURE: rounded shoulders, forward head, increased thoracic kyphosis, and flexed trunk  Rt knee valgus   PALPATION: TTP medial L knee   LOWER EXTREMITY ROM:  Active ROM Right eval Left eval  Hip flexion    Hip extension 3 3  Hip abduction 3 3  Hip adduction    Hip internal rotation    Hip external rotation    Knee flexion 121 120  Knee extension 20 19  Ankle dorsiflexion    Ankle plantarflexion    Ankle inversion    Ankle eversion     (Blank rows = not tested)  LOWER EXTREMITY MMT:  MMT Right eval Left eval  Hip flexion 4+ 4  Hip extension 3 3  Hip abduction 3 3  Hip adduction    Hip internal rotation    Hip external rotation    Knee flexion 5 4+  Knee extension 5 4+  Ankle dorsiflexion    Ankle plantarflexion    Ankle inversion    Ankle eversion     (Blank rows = not tested)    FUNCTIONAL TESTS:  5 times sit to stand: 18.2 sec used hands from blue office chair  30 seconds chair stand test Timed up and go (TUG): NT on eval  2 minute walk test: NT on eval   GAIT: Distance walked: 150 Assistive device utilized: Quad cane small base Level of assistance: Modified independence Comments: antalgic gait, slow velocity , wide BOS     TODAY'S TREATMENT: Pt eval and HEP established    PATIENT EDUCATION:  Education details: HEP, POC  Person educated: Patient Education method: Theatre stage manager Education comprehension: verbalized understanding and needs further education   HOME EXERCISE PROGRAM: Access Code:  [email or web address] URL: [email or web address] Date: 01/21/2022 Prepared by: Raeford Razor  Exercises - Supine Bridge  - 1 x daily - 7 x weekly - 2 sets - 10 reps - 5 hold - Supine Lower Trunk Rotation  - 1 x daily - 7 x weekly - 2 sets - 10 reps - 10 hold - Supine Active Straight Leg Raise  - 1 x daily - 7 x weekly - 2 sets - 10 reps - Seated Long Arc Quad  - 1 x daily - 7 x weekly - 2 sets - 10 reps - 5 hold - Sidelying Hip Abduction  - 1 x daily - 7 x weekly - 2 sets - 10 reps - 5 hold  ASSESSMENT:  CLINICAL IMPRESSION: Patient is a 76 y.o. female who was seen today for physical therapy evaluation and treatment for L knee pain due to moderate-to-severe tricompartmental degenerative changes.     OBJECTIVE IMPAIRMENTS Abnormal gait, decreased activity tolerance, decreased balance, decreased cognition, decreased endurance, decreased knowledge of use of DME, decreased mobility, difficulty walking, decreased ROM, decreased strength, increased edema, increased fascial restrictions, impaired flexibility, postural dysfunction, and pain.   ACTIVITY LIMITATIONS carrying, lifting, bending, sitting, standing, squatting, sleeping, stairs, transfers, bed mobility, locomotion level, and caring for others  PARTICIPATION LIMITATIONS: meal prep, cleaning, laundry, shopping, community activity, yard work, and    PERSONAL FACTORS Behavior pattern, Past/current experiences, Time since onset of injury/illness/exacerbation, and 3+ comorbidities: OA, diabetes, stress  are also affecting patient's functional outcome.   REHAB POTENTIAL: Excellent  CLINICAL DECISION MAKING: Unstable/unpredictable  EVALUATION COMPLEXITY: Low   GOALS: LONG TERM GOALS: Target date: 03/04/2022   Pt will be able to stand without UEs x 5 in < 20 sec to demo improved functional strength  Baseline:  Goal status: INITIAL  2.  Pt will be able to stand, walk in the community for 20 min without undue fatigue Baseline:  Goal  status: INITIAL  3.  Pt will be able to show I with HEP for hip and knee mobility, strength  Baseline:  Goal status: INITIAL  4.  Pt will be able to increase hip strength to at least 4/5 to optimize gait stability  Baseline:  Goal status: INITIAL  5.  Pt will ambulate with cane and improved stride length, trunk and hip extension when cued.  Baseline:  Goal status: INITIAL  PLAN: PT FREQUENCY: 2x/week  PT DURATION: 8 weeks  PLANNED INTERVENTIONS: Therapeutic exercises, Therapeutic activity, Neuromuscular re-education, Balance training, Gait training, Patient/Family education, Self Care, Joint mobilization, DME instructions, Aquatic Therapy, Cryotherapy, Moist heat, Taping, Manual therapy, and Re-evaluation  PLAN FOR NEXT SESSION: HEP, Nustep, hip strength, ant hip stretching   Raeford Razor, PT 01/21/22 4:19 PM Phone: [phone] Fax: [phone]

## 2022-01-27 ENCOUNTER — Ambulatory Visit: Payer: Medicare Other

## 2022-01-27 DIAGNOSIS — G8929 Other chronic pain: Secondary | ICD-10-CM | POA: Diagnosis not present

## 2022-01-27 DIAGNOSIS — R2681 Unsteadiness on feet: Secondary | ICD-10-CM | POA: Diagnosis not present

## 2022-01-27 DIAGNOSIS — M6281 Muscle weakness (generalized): Secondary | ICD-10-CM | POA: Diagnosis not present

## 2022-01-27 DIAGNOSIS — M25562 Pain in left knee: Secondary | ICD-10-CM | POA: Diagnosis not present

## 2022-01-27 DIAGNOSIS — M25662 Stiffness of left knee, not elsewhere classified: Secondary | ICD-10-CM

## 2022-01-27 DIAGNOSIS — M25661 Stiffness of right knee, not elsewhere classified: Secondary | ICD-10-CM

## 2022-01-27 NOTE — Therapy (Signed)
OUTPATIENT PHYSICAL THERAPY TREATMENT NOTE   Patient Name: Sharon Knight MRN: [id number] DOB:Sep 21, 1945, 76 y.o., female Today's Date: 01/27/2022  PCP: Billey Gosling, MD  REFERRING PROVIDER: Dr. [name]  END OF SESSION:   PT End of Session - 01/27/22 1508     Visit Number 2    Number of Visits 16    Date for PT Re-Evaluation 03/18/22    Authorization Type BCBS    PT Start Time 1508    PT Stop Time 1549    PT Time Calculation (min) 41 min    Equipment Utilized During Treatment Other (comment)   Elastic knee braces; SPC   Activity Tolerance Patient tolerated treatment well;Other (comment)   dizziness   Behavior During Therapy [facility] for tasks assessed/performed             Past Medical History:  Diagnosis Date   Arthritis    Bilateral cataracts    Dr Kathrin Penner   Exertional dyspnea 12/19/2020   Hyperlipidemia    Hypertension    Past Surgical History:  Procedure Laterality Date   COLONOSCOPY     Dr Fuller Plan   NEPHRECTOMY     right; for cyst on kidney   OVARIAN CYST REMOVAL     Patient Active Problem List   Diagnosis Date Noted   Vertigo 10/23/2021   Exertional dyspnea 12/19/2020   Aortic atherosclerosis ([place]) 10/16/2020   Acute pain of left knee 09/16/2020   Trigger finger, right ring finger 03/17/2019   CKD stage 3, 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 03/16/2019   Diabetes mellitus without complication ([place]) 21/30/8657   Bilateral low back pain 03/17/2018   Gout 12/21/2017   Lobar pneumonia ([place]) 12/10/2017   Paroxysmal atrial fibrillation ([place]) 12/10/2017   History of colonic polyps 10/22/2014   History of nephrectomy, unilateral 11/13/2012   Vitamin D deficiency 10/31/2012   ECZEMA 03/03/2010   Benign neoplasm of colon 08/02/2007   Hyperlipidemia 08/02/2007   Essential hypertension 08/02/2007   Osteopenia 08/02/2007    REFERRING DIAG: M25.562,G89.29 (ICD-10-CM) - Chronic pain of left knee   THERAPY DIAG:  Chronic pain of left knee  Stiffness  of left knee, not elsewhere classified  Stiffness of right knee, not elsewhere classified  Muscle weakness (generalized)  Gait instability  Rationale for Evaluation and Treatment Rehabilitation   SUBJECTIVE:    SUBJECTIVE STATEMENT: Pt reports her L knee pain is low today. Pt requested to review her HEP. Pt has waited to start her HEP until this PT session   PAIN:  Are you having pain? Yes: NPRS scale: 2/10 Pain location: L knee  Pain description: sore  Aggravating factors: standing , walking  Relieving factors: injection,   PERTINENT HISTORY:M25.562,G89.29 (ICD-10-CM) - Chronic pain of left knee    Gout, HTN, Diabetes, chronic knee pain, multi-factorial stress, Vertigo occ. , unsteady gait    PRECAUTIONS: None   WEIGHT BEARING RESTRICTIONS No   PATIENT GOALS Pt would like to have some more stamina.  I want to feel more confident with walking.      OBJECTIVE: (objective measures completed at initial evaluation unless otherwise dated)   DIAGNOSTIC FINDINGS:    MSK: Left knee mild effusion decreased range of motion pain with flexion and extension.  XR : 1. Moderate-to-severe tricompartmental degenerative changes, most pronounced in the medial and patellofemoral compartments. 2. Trace suprapatellar joint effusion. 3. Chondrocalcinosis.   PATIENT SURVEYS:  FOTO 48%   COGNITION:  Overall cognitive status: Within functional limits for tasks assessed                          SENSATION: WFL   EDEMA:  Circumferential: 16 3/8 inch each knee    MUSCLE LENGTH: Hamstrings: tight Thomas test: tight    POSTURE: rounded shoulders, forward head, increased thoracic kyphosis, and flexed trunk  Rt knee valgus    PALPATION: TTP medial L knee    LOWER EXTREMITY ROM:   Active ROM Right eval Left eval  Hip flexion      Hip extension 3 3  Hip abduction 3 3  Hip adduction      Hip internal rotation      Hip external rotation      Knee flexion 121 120  Knee  extension 20 19  Ankle dorsiflexion      Ankle plantarflexion      Ankle inversion      Ankle eversion       (Blank rows = not tested)   LOWER EXTREMITY MMT:   MMT Right eval Left eval  Hip flexion 4+ 4  Hip extension 3 3  Hip abduction 3 3  Hip adduction      Hip internal rotation      Hip external rotation      Knee flexion 5 4+  Knee extension 5 4+  Ankle dorsiflexion      Ankle plantarflexion      Ankle inversion      Ankle eversion       (Blank rows = not tested)       FUNCTIONAL TESTS:  5 times sit to stand: 18.2 sec used hands from blue office chair  30 seconds chair stand test Timed up and go (TUG): NT on eval  2 minute walk test: NT on eval    GAIT: Distance walked: 150 Assistive device utilized: Quad cane small base Level of assistance: Modified independence Comments: antalgic gait, slow velocity , wide BOS        TODAY'S TREATMENT: [facility] Adult PT Treatment:                                                DATE: 01/27/22 Therapeutic Exercise: - Supine Bridge 10 reps 5 hold - Supine Lower Trunk Rotation  10 reps - 10 hold - Supine Active Straight Leg Raise 2 sets - 10 reps each - Seated Long Arc Quad  2 sets - 10 reps - 5 hold - Sidelying Hip Abduction 2 sets - 10 reps - 5 hold Reviewed HEP  Pt eval and HEP established      PATIENT EDUCATION:  Education details: HEP, POC  Person educated: Patient Education method: Explanation and Handouts Education comprehension: verbalized understanding and needs further education     HOME EXERCISE PROGRAM: Access Code: [email or web address] URL: [email or web address] Date: 01/21/2022 Prepared by: Raeford Razor   Exercises - Supine Bridge  - 1 x daily - 7 x weekly - 2 sets - 10 reps - 5 hold - Supine Lower Trunk Rotation  - 1 x daily - 7 x weekly - 2 sets - 10 reps - 10 hold - Supine Active Straight Leg Raise  - 1 x daily - 7 x weekly - 2 sets - 10 reps - Seated Long Arc Quad  - 1 x  daily - 7 x weekly - 2  sets - 10 reps - 5 hold - Sidelying Hip Abduction  - 1 x daily - 7 x weekly - 2 sets - 10 reps - 5 hold   ASSESSMENT:   CLINICAL IMPRESSION: PT was completed for bilat knee/LE strengthening therex. Pt needed verbal cueing for proper completion. Pt tolerated the PT session regarding L knee pain, however pt experienced dizziness with turning to her sides and with supine to/from sitting. Pt needed extra time between exs due to the dizzneess. Pt was provided CGA to her vechile due to unsteadiness to ensure safety. Pt reports she experiences dizziness on an intermittent basis and will have her husband help her when she gets home.    OBJECTIVE IMPAIRMENTS Abnormal gait, decreased activity tolerance, decreased balance, decreased cognition, decreased endurance, decreased knowledge of use of DME, decreased mobility, difficulty walking, decreased ROM, decreased strength, increased edema, increased fascial restrictions, impaired flexibility, postural dysfunction, and pain.    ACTIVITY LIMITATIONS carrying, lifting, bending, sitting, standing, squatting, sleeping, stairs, transfers, bed mobility, locomotion level, and caring for others   PARTICIPATION LIMITATIONS: meal prep, cleaning, laundry, shopping, community activity, yard work, and     PERSONAL FACTORS Behavior pattern, Past/current experiences, Time since onset of injury/illness/exacerbation, and 3+ comorbidities: OA, diabetes, stress  are also affecting patient's functional outcome.     GOALS: LONG TERM GOALS: Target date: 03/04/2022    Pt will be able to stand without UEs x 5 in < 20 sec to demo improved functional strength  Baseline:  Goal status: INITIAL   2.  Pt will be able to stand, walk in the community for 20 min without undue fatigue Baseline:  Goal status: INITIAL   3.  Pt will be able to show I with HEP for hip and knee mobility, strength  Baseline:  Goal status: INITIAL   4.  Pt will be able to increase hip strength to at least  4/5 to optimize gait stability  Baseline:  Goal status: INITIAL   5.  Pt will ambulate with cane and improved stride length, trunk and hip extension when cued.  Baseline:  Goal status: INITIAL   PLAN: PT FREQUENCY: 2x/week   PT DURATION: 8 weeks   PLANNED INTERVENTIONS: Therapeutic exercises, Therapeutic activity, Neuromuscular re-education, Balance training, Gait training, Patient/Family education, Self Care, Joint mobilization, DME instructions, Aquatic Therapy, Cryotherapy, Moist heat, Taping, Manual therapy, and Re-evaluation   PLAN FOR NEXT SESSION: HEP, Nustep, hip strength, ant hip stretching. Consider seated exs if pt continues to have an issue with lying supine and turning to her sides    [employer] MS, PT 01/27/22 4:14 PM

## 2022-01-29 ENCOUNTER — Ambulatory Visit: Payer: Medicare Other

## 2022-01-29 DIAGNOSIS — R2681 Unsteadiness on feet: Secondary | ICD-10-CM | POA: Diagnosis not present

## 2022-01-29 DIAGNOSIS — G8929 Other chronic pain: Secondary | ICD-10-CM | POA: Diagnosis not present

## 2022-01-29 DIAGNOSIS — M25562 Pain in left knee: Secondary | ICD-10-CM | POA: Diagnosis not present

## 2022-01-29 DIAGNOSIS — M25662 Stiffness of left knee, not elsewhere classified: Secondary | ICD-10-CM

## 2022-01-29 DIAGNOSIS — M25661 Stiffness of right knee, not elsewhere classified: Secondary | ICD-10-CM

## 2022-01-29 DIAGNOSIS — M6281 Muscle weakness (generalized): Secondary | ICD-10-CM | POA: Diagnosis not present

## 2022-01-29 NOTE — Therapy (Signed)
OUTPATIENT PHYSICAL THERAPY TREATMENT NOTE   Patient Name: Sharon Knight MRN: [id number] DOB:Sep 26, 1945, 76 y.o., female Today's Date: 01/29/2022  PCP: Billey Gosling, MD  REFERRING PROVIDER: Dr. [name]  END OF SESSION:   PT End of Session - 01/29/22 1506     Visit Number 3    Number of Visits 16    Date for PT Re-Evaluation 03/18/22    Authorization Type BCBS    PT Start Time 1502    PT Stop Time 1543    PT Time Calculation (min) 41 min    Equipment Utilized During Treatment Other (comment)   Elastic knee braces; SPC   Activity Tolerance Patient tolerated treatment well    Behavior During Therapy [facility] for tasks assessed/performed              Past Medical History:  Diagnosis Date   Arthritis    Bilateral cataracts    Dr Kathrin Penner   Exertional dyspnea 12/19/2020   Hyperlipidemia    Hypertension    Past Surgical History:  Procedure Laterality Date   COLONOSCOPY     Dr Fuller Plan   NEPHRECTOMY     right; for cyst on kidney   OVARIAN CYST REMOVAL     Patient Active Problem List   Diagnosis Date Noted   Vertigo 10/23/2021   Exertional dyspnea 12/19/2020   Aortic atherosclerosis ([place]) 10/16/2020   Acute pain of left knee 09/16/2020   Trigger finger, right ring finger 03/17/2019   CKD stage 3, GFR 30-59 ml/min ([place]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 03/16/2019   Diabetes mellitus without complication ([place]) 09/73/5329   Bilateral low back pain 03/17/2018   Gout 12/21/2017   Lobar pneumonia ([place]) 12/10/2017   Paroxysmal atrial fibrillation ([place]) 12/10/2017   History of colonic polyps 10/22/2014   History of nephrectomy, unilateral 11/13/2012   Vitamin D deficiency 10/31/2012   ECZEMA 03/03/2010   Benign neoplasm of colon 08/02/2007   Hyperlipidemia 08/02/2007   Essential hypertension 08/02/2007   Osteopenia 08/02/2007    REFERRING DIAG: M25.562,G89.29 (ICD-10-CM) - Chronic pain of left knee   THERAPY DIAG:  Chronic pain of left knee  Stiffness of left knee, not  elsewhere classified  Stiffness of right knee, not elsewhere classified  Muscle weakness (generalized)  Gait instability  Rationale for Evaluation and Treatment Rehabilitation   SUBJECTIVE:    SUBJECTIVE STATEMENT: "My knee pain comes and goes" Pt reports she did her HEP yesterday   PAIN:  Are you having pain? Yes: NPRS scale: 0/10 Pain location: L knee  Pain description: sore  Aggravating factors: standing , walking  Relieving factors: injection,   PERTINENT HISTORY:M25.562,G89.29 (ICD-10-CM) - Chronic pain of left knee    Gout, HTN, Diabetes, chronic knee pain, multi-factorial stress, Vertigo occ. , unsteady gait    PRECAUTIONS: None   WEIGHT BEARING RESTRICTIONS No   PATIENT GOALS Pt would like to have some more stamina.  I want to feel more confident with walking.      OBJECTIVE: (objective measures completed at initial evaluation unless otherwise dated)   DIAGNOSTIC FINDINGS:    MSK: Left knee mild effusion decreased range of motion pain with flexion and extension.  XR : 1. Moderate-to-severe tricompartmental degenerative changes, most pronounced in the medial and patellofemoral compartments. 2. Trace suprapatellar joint effusion. 3. Chondrocalcinosis.   PATIENT SURVEYS:  FOTO 48%   COGNITION:           Overall cognitive status: Within functional limits for tasks assessed  SENSATION: WFL   EDEMA:  Circumferential: 16 3/8 inch each knee    MUSCLE LENGTH: Hamstrings: tight Thomas test: tight    POSTURE: rounded shoulders, forward head, increased thoracic kyphosis, and flexed trunk  Rt knee valgus    PALPATION: TTP medial L knee    LOWER EXTREMITY ROM:   Active ROM Right eval Left eval  Hip flexion      Hip extension 3 3  Hip abduction 3 3  Hip adduction      Hip internal rotation      Hip external rotation      Knee flexion 121 120  Knee extension 20 19  Ankle dorsiflexion      Ankle plantarflexion      Ankle  inversion      Ankle eversion       (Blank rows = not tested)   LOWER EXTREMITY MMT:   MMT Right eval Left eval  Hip flexion 4+ 4  Hip extension 3 3  Hip abduction 3 3  Hip adduction      Hip internal rotation      Hip external rotation      Knee flexion 5 4+  Knee extension 5 4+  Ankle dorsiflexion      Ankle plantarflexion      Ankle inversion      Ankle eversion       (Blank rows = not tested)       FUNCTIONAL TESTS:  5 times sit to stand: 18.2 sec used hands from blue office chair  30 seconds chair stand test Timed up and go (TUG): NT on eval  2 minute walk test: NT on eval    GAIT: Distance walked: 150 Assistive device utilized: Quad cane small base Level of assistance: Modified independence Comments: antalgic gait, slow velocity , wide BOS        TODAY'S TREATMENT: [facility] Adult PT Treatment:                                                DATE: 01/29/22 Therapeutic Exercise: Supine Therx were completed c a wedge to minimize dizziness Nu Step L4 6 mins UE/LE Supine Bridge 2x10 reps 5 hold Supine Lower Trunk Rotation  10 reps - 10 hold Supine Active Straight Leg Raise 2 sets - 10 reps each Seated Long Arc Quad  2 sets - 10 reps - 5 hold 3# Supine Hip clams 2 sets - 10 reps - 5 hold  [facility] Adult PT Treatment:                                                DATE: 01/27/22 Therapeutic Exercise: - Supine Bridge 10 reps 5 hold - Supine Lower Trunk Rotation  10 reps - 10 hold - Supine Active Straight Leg Raise 2 sets - 10 reps each - Seated Long Arc Quad  2 sets - 10 reps - 5 hold - Sidelying Hip Abduction 2 sets - 10 reps - 5 hold Reviewed HEP  Pt eval and HEP established      PATIENT EDUCATION:  Education details: HEP, POC  Person educated: Patient Education method: Explanation and Handouts Education comprehension: verbalized understanding and needs further education     HOME  EXERCISE PROGRAM: Access Code: [email or web address] URL:  [email or web address] Date: 01/21/2022 Prepared by: Raeford Razor   Exercises - Supine Bridge  - 1 x daily - 7 x weekly - 2 sets - 10 reps - 5 hold - Supine Lower Trunk Rotation  - 1 x daily - 7 x weekly - 2 sets - 10 reps - 10 hold - Supine Active Straight Leg Raise  - 1 x daily - 7 x weekly - 2 sets - 10 reps - Seated Long Arc Quad  - 1 x daily - 7 x weekly - 2 sets - 10 reps - 5 hold - Sidelying Hip Abduction  - 1 x daily - 7 x weekly - 2 sets - 10 reps - 5 hold   ASSESSMENT:   CLINICAL IMPRESSION: Completing supine therex with a wedge which minimized pt's dizziness. PT was completed for strengthening exs for the knees and hips. Pt completed the exs properly and reports completing them yesterday as part of her HEP. Pt tolerated PT today without adverse effects. Due to the chronicity of pt's condition, anticipate that progress will slow.  Pt did not need assistance other than her SPC to walk from the PT gym with appropriate balnce   OBJECTIVE IMPAIRMENTS Abnormal gait, decreased activity tolerance, decreased balance, decreased cognition, decreased endurance, decreased knowledge of use of DME, decreased mobility, difficulty walking, decreased ROM, decreased strength, increased edema, increased fascial restrictions, impaired flexibility, postural dysfunction, and pain.    ACTIVITY LIMITATIONS carrying, lifting, bending, sitting, standing, squatting, sleeping, stairs, transfers, bed mobility, locomotion level, and caring for others   PARTICIPATION LIMITATIONS: meal prep, cleaning, laundry, shopping, community activity, yard work, and     PERSONAL FACTORS Behavior pattern, Past/current experiences, Time since onset of injury/illness/exacerbation, and 3+ comorbidities: OA, diabetes, stress  are also affecting patient's functional outcome.     GOALS: LONG TERM GOALS: Target date: 03/04/2022    Pt will be able to stand without UEs x 5 in < 20 sec to demo improved functional  strength  Baseline:  Goal status: INITIAL   2.  Pt will be able to stand, walk in the community for 20 min without undue fatigue Baseline:  Goal status: INITIAL   3.  Pt will be able to show I with HEP for hip and knee mobility, strength  Baseline:  Goal status: INITIAL   4.  Pt will be able to increase hip strength to at least 4/5 to optimize gait stability  Baseline:  Goal status: INITIAL   5.  Pt will ambulate with cane and improved stride length, trunk and hip extension when cued.  Baseline:  Goal status: INITIAL   PLAN: PT FREQUENCY: 2x/week   PT DURATION: 8 weeks   PLANNED INTERVENTIONS: Therapeutic exercises, Therapeutic activity, Neuromuscular re-education, Balance training, Gait training, Patient/Family education, Self Care, Joint mobilization, DME instructions, Aquatic Therapy, Cryotherapy, Moist heat, Taping, Manual therapy, and Re-evaluation   PLAN FOR NEXT SESSION: HEP, Nustep, hip strength, ant hip stretching. Consider seated exs if pt continues to have an issue with lying supine and turning to her sides    [employer] MS, PT 01/29/22 6:03 PM

## 2022-01-30 ENCOUNTER — Other Ambulatory Visit: Payer: Self-pay | Admitting: Internal Medicine

## 2022-01-30 DIAGNOSIS — Z09 Encounter for follow-up examination after completed treatment for conditions other than malignant neoplasm: Secondary | ICD-10-CM

## 2022-02-03 ENCOUNTER — Ambulatory Visit: Payer: Medicare Other | Attending: Family Medicine

## 2022-02-03 DIAGNOSIS — R2681 Unsteadiness on feet: Secondary | ICD-10-CM | POA: Insufficient documentation

## 2022-02-03 DIAGNOSIS — G8929 Other chronic pain: Secondary | ICD-10-CM | POA: Insufficient documentation

## 2022-02-03 DIAGNOSIS — M6281 Muscle weakness (generalized): Secondary | ICD-10-CM | POA: Diagnosis not present

## 2022-02-03 DIAGNOSIS — M25661 Stiffness of right knee, not elsewhere classified: Secondary | ICD-10-CM | POA: Insufficient documentation

## 2022-02-03 DIAGNOSIS — M25562 Pain in left knee: Secondary | ICD-10-CM | POA: Diagnosis not present

## 2022-02-03 DIAGNOSIS — M25662 Stiffness of left knee, not elsewhere classified: Secondary | ICD-10-CM | POA: Insufficient documentation

## 2022-02-03 DIAGNOSIS — M25561 Pain in right knee: Secondary | ICD-10-CM | POA: Insufficient documentation

## 2022-02-03 NOTE — Therapy (Signed)
OUTPATIENT PHYSICAL THERAPY TREATMENT NOTE   Patient Name: Sharon Knight MRN: [id number] DOB:[birth year], 76 y.o., female Today's Date: 02/03/2022  PCP: Billey Gosling, MD  REFERRING PROVIDER: Dr. [name]  END OF SESSION:   PT End of Session - 02/03/22 1434     Visit Number 4    Number of Visits 16    Date for PT Re-Evaluation 03/18/22    Authorization Type BCBS    PT Start Time 1422    PT Stop Time 1502    PT Time Calculation (min) 40 min    Equipment Utilized During Treatment Other (comment)   Elastic knee braces; SPC   Activity Tolerance Patient tolerated treatment well    Behavior During Therapy [facility] for tasks assessed/performed               Past Medical History:  Diagnosis Date   Arthritis    Bilateral cataracts    Dr Kathrin Penner   Exertional dyspnea 12/19/2020   Hyperlipidemia    Hypertension    Past Surgical History:  Procedure Laterality Date   COLONOSCOPY     Dr Fuller Plan   NEPHRECTOMY     right; for cyst on kidney   OVARIAN CYST REMOVAL     Patient Active Problem List   Diagnosis Date Noted   Vertigo 10/23/2021   Exertional dyspnea 12/19/2020   Aortic atherosclerosis ([place]) 10/16/2020   Acute pain of left knee 09/16/2020   Trigger finger, right ring finger 03/17/2019   CKD stage 3, GFR 30-59 ml/min ([place]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 03/16/2019   Diabetes mellitus without complication ([place]) 33/82/5053   Bilateral low back pain 03/17/2018   Gout 12/21/2017   Lobar pneumonia ([place]) 12/10/2017   Paroxysmal atrial fibrillation ([place]) 12/10/2017   History of colonic polyps 10/22/2014   History of nephrectomy, unilateral 11/13/2012   Vitamin D deficiency 10/31/2012   ECZEMA 03/03/2010   Benign neoplasm of colon 08/02/2007   Hyperlipidemia 08/02/2007   Essential hypertension 08/02/2007   Osteopenia 08/02/2007    REFERRING DIAG: M25.562,G89.29 (ICD-10-CM) - Chronic pain of left knee   THERAPY DIAG:  Chronic pain of left knee  Stiffness of left knee, not  elsewhere classified  Stiffness of right knee, not elsewhere classified  Muscle weakness (generalized)  Gait instability  Rationale for Evaluation and Treatment Rehabilitation   SUBJECTIVE:    SUBJECTIVE STATEMENT: Pt reports her knee pain is usually brief and occurs when walking. Pt feels that her balance is better.   PAIN:  Are you having pain? Yes: NPRS scale: 0/10 Pain location: L knee  Pain description: sore  Aggravating factors: standing , walking  Relieving factors: injection,   PERTINENT HISTORY:M25.562,G89.29 (ICD-10-CM) - Chronic pain of left knee    Gout, HTN, Diabetes, chronic knee pain, multi-factorial stress, Vertigo occ. , unsteady gait    PRECAUTIONS: None   WEIGHT BEARING RESTRICTIONS No   PATIENT GOALS Pt would like to have some more stamina.  I want to feel more confident with walking.      OBJECTIVE: (objective measures completed at initial evaluation unless otherwise dated)   DIAGNOSTIC FINDINGS:    MSK: Left knee mild effusion decreased range of motion pain with flexion and extension.  XR : 1. Moderate-to-severe tricompartmental degenerative changes, most pronounced in the medial and patellofemoral compartments. 2. Trace suprapatellar joint effusion. 3. Chondrocalcinosis.   PATIENT SURVEYS:  FOTO 48%   COGNITION:           Overall cognitive status: Within functional limits for  tasks assessed                          SENSATION: WFL   EDEMA:  Circumferential: 16 3/8 inch each knee    MUSCLE LENGTH: Hamstrings: tight Thomas test: tight    POSTURE: rounded shoulders, forward head, increased thoracic kyphosis, and flexed trunk  Rt knee valgus    PALPATION: TTP medial L knee    LOWER EXTREMITY ROM:   Active ROM Right eval Left eval  Hip flexion      Hip extension 3 3  Hip abduction 3 3  Hip adduction      Hip internal rotation      Hip external rotation      Knee flexion 121 120  Knee extension 20 19  Ankle dorsiflexion       Ankle plantarflexion      Ankle inversion      Ankle eversion       (Blank rows = not tested)   LOWER EXTREMITY MMT:   MMT Right eval Left eval  Hip flexion 4+ 4  Hip extension 3 3  Hip abduction 3 3  Hip adduction      Hip internal rotation      Hip external rotation      Knee flexion 5 4+  Knee extension 5 4+  Ankle dorsiflexion      Ankle plantarflexion      Ankle inversion      Ankle eversion       (Blank rows = not tested)       FUNCTIONAL TESTS:  5 times sit to stand: 18.2 sec used hands from blue office chair  30 seconds chair stand test Timed up and go (TUG): NT on eval  2 minute walk test: NT on eval    GAIT: Distance walked: 150 Assistive device utilized: Quad cane small base Level of assistance: Modified independence Comments: antalgic gait, slow velocity , wide BOS        TODAY'S TREATMENT: [facility] Adult PT Treatment:                                                DATE: 02/03/22 Therapeutic Exercise: Supine Therex were completed c a wedge to minimize dizziness Nu Step L7 6 mins UE/LE Supine Bridge 2x10 reps 5 hold Supine Lower Trunk Rotation  10 reps - 10 hold Supine Active Straight Leg Raise 2 sets - 10 reps each 2# L Seated Long Arc Quad  2 sets - 10 reps - 5 hold 4# STS x10 s hands  Supine Hip clams 2 sets - 10 reps - 5 hold  [facility] Adult PT Treatment:                                                DATE: 01/29/22 Therapeutic Exercise: Supine Therx were completed c a wedge to minimize dizziness Nu Step L4 6 mins UE/LE Supine Bridge 2x10 reps 5 hold Supine Lower Trunk Rotation  10 reps - 10 hold Supine Active Straight Leg Raise 2 sets - 10 reps each Seated Long Arc Quad  2 sets - 10 reps - 5 hold 3# Supine Hip clams 2 sets - 10  reps - 5 hold  [facility] Adult PT Treatment:                                                DATE: 01/27/22 Therapeutic Exercise: - Supine Bridge 10 reps 5 hold - Supine Lower Trunk Rotation  10 reps - 10 hold - Supine Active  Straight Leg Raise 2 sets - 10 reps each - Seated Long Arc Quad  2 sets - 10 reps - 5 hold - Sidelying Hip Abduction 2 sets - 10 reps - 5 hold Reviewed HEP  Pt eval and HEP established      PATIENT EDUCATION:  Education details: HEP, POC  Person educated: Patient Education method: Explanation and Handouts Education comprehension: verbalized understanding and needs further education     HOME EXERCISE PROGRAM: Access Code: [email or web address] URL: [email or web address] Date: 02/03/2022 Prepared by: Gar Ponto  Exercises - Supine Bridge  - 1 x daily - 7 x weekly - 2 sets - 10 reps - 5 hold - Supine Lower Trunk Rotation  - 1 x daily - 7 x weekly - 2 sets - 10 reps - 10 hold - Supine Active Straight Leg Raise  - 1 x daily - 7 x weekly - 2 sets - 10 reps - Seated Long Arc Quad  - 1 x daily - 7 x weekly - 2 sets - 10 reps - 5 hold - Sidelying Hip Abduction  - 1 x daily - 7 x weekly - 2 sets - 10 reps - 5 hold - Hooklying Clamshell with Resistance  - 1 x daily - 7 x weekly - 3 sets - 10 reps - 3 hold   ASSESSMENT:   CLINICAL IMPRESSION: PT was completed for LE and lumbopelvic flexibility and strengthening. PT is completing her therex/HEP properly and reports completing daily. Pt notes she feels like her balance is better. Pt tolerated PT today without adverse effects. Completing therex supine on a wedge vs. completely supine has decreased the pt experiencing dizziness. . OBJECTIVE IMPAIRMENTS Abnormal gait, decreased activity tolerance, decreased balance, decreased cognition, decreased endurance, decreased knowledge of use of DME, decreased mobility, difficulty walking, decreased ROM, decreased strength, increased edema, increased fascial restrictions, impaired flexibility, postural dysfunction, and pain.    ACTIVITY LIMITATIONS carrying, lifting, bending, sitting, standing, squatting, sleeping, stairs, transfers, bed mobility, locomotion level, and caring for others   PARTICIPATION  LIMITATIONS: meal prep, cleaning, laundry, shopping, community activity, yard work, and     PERSONAL FACTORS Behavior pattern, Past/current experiences, Time since onset of injury/illness/exacerbation, and 3+ comorbidities: OA, diabetes, stress  are also affecting patient's functional outcome.     GOALS: LONG TERM GOALS: Target date: 03/04/2022    Pt will be able to stand without UEs x 5 in < 20 sec to demo improved functional strength  Baseline:  Goal status: INITIAL   2.  Pt will be able to stand, walk in the community for 20 min without undue fatigue Baseline:  Goal status: INITIAL   3.  Pt will be able to show I with HEP for hip and knee mobility, strength  Baseline:  Status: pt demonstrates proper completion of HEP and reports completing daily Goal status: Ongoing   4.  Pt will be able to increase hip strength to at least 4/5 to optimize gait stability  Baseline:  Goal status: INITIAL   5.  Pt  will ambulate with cane and improved stride length, trunk and hip extension when cued.  Baseline:  Goal status: INITIAL   PLAN: PT FREQUENCY: 2x/week   PT DURATION: 8 weeks   PLANNED INTERVENTIONS: Therapeutic exercises, Therapeutic activity, Neuromuscular re-education, Balance training, Gait training, Patient/Family education, Self Care, Joint mobilization, DME instructions, Aquatic Therapy, Cryotherapy, Moist heat, Taping, Manual therapy, and Re-evaluation   PLAN FOR NEXT SESSION: HEP, Nustep, hip strength, ant hip stretching. Consider seated exs if pt continues to have an issue with lying supine and turning to her sides. Reassess 5xSTS and FOTO.   Rehema Muffley MS, PT 02/03/22 3:28 PM

## 2022-02-04 NOTE — Therapy (Unsigned)
OUTPATIENT PHYSICAL THERAPY TREATMENT NOTE   Patient Name: Sharon Knight MRN: [id number] DOB:23-Sep-1945, 76 y.o., female Today's Date: 02/05/2022  PCP: Billey Gosling, MD  REFERRING PROVIDER: Dr. [name]  END OF SESSION:   PT End of Session - 02/05/22 1426     Visit Number 5    Number of Visits 16    Date for PT Re-Evaluation 03/18/22    Authorization Type BCBS    PT Start Time 1422    PT Stop Time 1502    PT Time Calculation (min) 40 min    Equipment Utilized During Treatment Other (comment)   quad cane   Activity Tolerance Patient tolerated treatment well    Behavior During Therapy [facility] for tasks assessed/performed                Past Medical History:  Diagnosis Date   Arthritis    Bilateral cataracts    Dr Kathrin Penner   Exertional dyspnea 12/19/2020   Hyperlipidemia    Hypertension    Past Surgical History:  Procedure Laterality Date   COLONOSCOPY     Dr Fuller Plan   NEPHRECTOMY     right; for cyst on kidney   OVARIAN CYST REMOVAL     Patient Active Problem List   Diagnosis Date Noted   Vertigo 10/23/2021   Exertional dyspnea 12/19/2020   Aortic atherosclerosis ([place]) 10/16/2020   Acute pain of left knee 09/16/2020   Trigger finger, right ring finger 03/17/2019   CKD stage 3, 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 03/16/2019   Diabetes mellitus without complication ([place]) 00/71/2197   Bilateral low back pain 03/17/2018   Gout 12/21/2017   Lobar pneumonia ([place]) 12/10/2017   Paroxysmal atrial fibrillation ([place]) 12/10/2017   History of colonic polyps 10/22/2014   History of nephrectomy, unilateral 11/13/2012   Vitamin D deficiency 10/31/2012   ECZEMA 03/03/2010   Benign neoplasm of colon 08/02/2007   Hyperlipidemia 08/02/2007   Essential hypertension 08/02/2007   Osteopenia 08/02/2007    REFERRING DIAG: M25.562,G89.29 (ICD-10-CM) - Chronic pain of left knee   THERAPY DIAG:  Chronic pain of left knee  Stiffness of left knee, not elsewhere  classified  Stiffness of right knee, not elsewhere classified  Muscle weakness (generalized)  Gait instability  Rationale for Evaluation and Treatment Rehabilitation   SUBJECTIVE:    SUBJECTIVE STATEMENT: Pt reports she just doesn't have the energy she used to.   PAIN:  Are you having pain? Yes: NPRS scale: 0/10 Pain location: L knee  Pain description: sore  Aggravating factors: standing , walking  Relieving factors: injection,   PERTINENT HISTORY:M25.562,G89.29 (ICD-10-CM) - Chronic pain of left knee    Gout, HTN, Diabetes, chronic knee pain, multi-factorial stress, Vertigo occ. , unsteady gait    PRECAUTIONS: None   WEIGHT BEARING RESTRICTIONS No   PATIENT GOALS Pt would like to have some more stamina.  I want to feel more confident with walking.      OBJECTIVE: (objective measures completed at initial evaluation unless otherwise dated)   DIAGNOSTIC FINDINGS:    MSK: Left knee mild effusion decreased range of motion pain with flexion and extension.  XR : 1. Moderate-to-severe tricompartmental degenerative changes, most pronounced in the medial and patellofemoral compartments. 2. Trace suprapatellar joint effusion. 3. Chondrocalcinosis.   PATIENT SURVEYS:  FOTO 48%   COGNITION:           Overall cognitive status: Within functional limits for tasks assessed  SENSATION: WFL   EDEMA:  Circumferential: 16 3/8 inch each knee    MUSCLE LENGTH: Hamstrings: tight Thomas test: tight    POSTURE: rounded shoulders, forward head, increased thoracic kyphosis, and flexed trunk  Rt knee valgus    PALPATION: TTP medial L knee    LOWER EXTREMITY ROM:   Active ROM Right eval Left eval  Hip flexion      Hip extension 3 3  Hip abduction 3 3  Hip adduction      Hip internal rotation      Hip external rotation      Knee flexion 121 120  Knee extension 20 19  Ankle dorsiflexion      Ankle plantarflexion      Ankle inversion       Ankle eversion       (Blank rows = not tested)   LOWER EXTREMITY MMT:   MMT Right eval Left eval  Hip flexion 4+ 4  Hip extension 3 3  Hip abduction 3 3  Hip adduction      Hip internal rotation      Hip external rotation      Knee flexion 5 4+  Knee extension 5 4+  Ankle dorsiflexion      Ankle plantarflexion      Ankle inversion      Ankle eversion       (Blank rows = not tested)       FUNCTIONAL TESTS:  5 times sit to stand: 18.2 sec used hands from blue office chair  30 seconds chair stand test Timed up and go (TUG): NT on eval  2 minute walk test: NT on eval    GAIT: Distance walked: 150 Assistive device utilized: Quad cane small base Level of assistance: Modified independence Comments: antalgic gait, slow velocity , wide BOS    TODAY'S TREATMENT: [facility] Adult PT Treatment:                                                DATE: 02/05/22 Therapeutic Exercise: Supine Therex were completed c a wedge to minimize dizziness Nu Step L7 6 mins UE/LE Prolonged standing c feet a part >90"; feet together >90"; partial tandem x3 20' each Seated Long Arc Quad  2 sets - 10 reps - 5 hold 4# STS x10 s hands  Standing hip abd 2 sets - 10 reps - 3 hold Standing hip ext 2 sets - 10 reps - 3 hold  [facility] Adult PT Treatment:                                                DATE: 02/03/22 Therapeutic Exercise: Supine Therex were completed c a wedge to minimize dizziness Nu Step L7 6 mins UE/LE Supine Bridge 2x10 reps 5 hold Supine Lower Trunk Rotation  10 reps - 10 hold Supine Active Straight Leg Raise 2 sets - 10 reps each 2# L Seated Long Arc Quad  2 sets - 10 reps - 5 hold 4# STS x10 s hands  Supine Hip clams 2 sets - 10 reps - 5 hold   PATIENT EDUCATION:  Education details: HEP, POC  Person educated: Patient Education method: Explanation and Handouts Education comprehension: verbalized understanding and  needs further education     HOME EXERCISE PROGRAM: Access Code:  [email or web address] URL: [email or web address] Date: 02/03/2022 Prepared by: Gar Ponto  Exercises - Supine Bridge  - 1 x daily - 7 x weekly - 2 sets - 10 reps - 5 hold - Supine Lower Trunk Rotation  - 1 x daily - 7 x weekly - 2 sets - 10 reps - 10 hold - Supine Active Straight Leg Raise  - 1 x daily - 7 x weekly - 2 sets - 10 reps - Seated Long Arc Quad  - 1 x daily - 7 x weekly - 2 sets - 10 reps - 5 hold - Sidelying Hip Abduction  - 1 x daily - 7 x weekly - 2 sets - 10 reps - 5 hold - Hooklying Clamshell with Resistance  - 1 x daily - 7 x weekly - 3 sets - 10 reps - 3 hold   ASSESSMENT:   CLINICAL IMPRESSION: Assessed prolonged standing with pt demonstrating appropriate unassisted standing balance with feet a part or together for >90", meeting the 1st LTG. Pt was completed for standing balance and for LE strengthening in both the open and closed kinetic chains. Pt is making appropriate progress. Pt tolerated PT today without adverse effects. Pt will continue to benefit from skilled PT to address impairments for improved functional mobility with safety. . OBJECTIVE IMPAIRMENTS Abnormal gait, decreased activity tolerance, decreased balance, decreased cognition, decreased endurance, decreased knowledge of use of DME, decreased mobility, difficulty walking, decreased ROM, decreased strength, increased edema, increased fascial restrictions, impaired flexibility, postural dysfunction, and pain.    ACTIVITY LIMITATIONS carrying, lifting, bending, sitting, standing, squatting, sleeping, stairs, transfers, bed mobility, locomotion level, and caring for others   PARTICIPATION LIMITATIONS: meal prep, cleaning, laundry, shopping, community activity, yard work, and     PERSONAL FACTORS Behavior pattern, Past/current experiences, Time since onset of injury/illness/exacerbation, and 3+ comorbidities: OA, diabetes, stress  are also affecting patient's functional outcome.     GOALS: LONG TERM  GOALS: Target date: 03/04/2022    Pt will be able to stand without UEs x 5 in < 20 sec to demo improved functional strength  Baseline:  Status: 02/05/22=Standing feet a part and together 90" each Goal status: Met   2.  Pt will be able to stand, walk in the community for 20 min without undue fatigue Baseline:  Goal status: INITIAL   3.  Pt will be able to show I with HEP for hip and knee mobility, strength  Baseline:  Status: pt demonstrates proper completion of HEP and reports completing daily Goal status: Ongoing   4.  Pt will be able to increase hip strength to at least 4/5 to optimize gait stability  Baseline:  Goal status: INITIAL   5.  Pt will ambulate with cane and improved stride length, trunk and hip extension when cued.  Baseline:  Goal status: INITIAL   PLAN: PT FREQUENCY: 2x/week   PT DURATION: 8 weeks   PLANNED INTERVENTIONS: Therapeutic exercises, Therapeutic activity, Neuromuscular re-education, Balance training, Gait training, Patient/Family education, Self Care, Joint mobilization, DME instructions, Aquatic Therapy, Cryotherapy, Moist heat, Taping, Manual therapy, and Re-evaluation   PLAN FOR NEXT SESSION: HEP, Nustep, hip strength, ant hip stretching. Consider seated exs if pt continues to have an issue with lying supine and turning to her sides. Reassess 5xSTS and FOTO.   Alya Smaltz MS, PT 02/05/22 3:54 PM

## 2022-02-05 ENCOUNTER — Ambulatory Visit: Payer: Medicare Other

## 2022-02-05 DIAGNOSIS — M25662 Stiffness of left knee, not elsewhere classified: Secondary | ICD-10-CM | POA: Diagnosis not present

## 2022-02-05 DIAGNOSIS — M6281 Muscle weakness (generalized): Secondary | ICD-10-CM

## 2022-02-05 DIAGNOSIS — M25661 Stiffness of right knee, not elsewhere classified: Secondary | ICD-10-CM | POA: Diagnosis not present

## 2022-02-05 DIAGNOSIS — G8929 Other chronic pain: Secondary | ICD-10-CM | POA: Diagnosis not present

## 2022-02-05 DIAGNOSIS — R2681 Unsteadiness on feet: Secondary | ICD-10-CM

## 2022-02-05 DIAGNOSIS — M25561 Pain in right knee: Secondary | ICD-10-CM | POA: Diagnosis not present

## 2022-02-05 DIAGNOSIS — M25562 Pain in left knee: Secondary | ICD-10-CM | POA: Diagnosis not present

## 2022-02-10 ENCOUNTER — Ambulatory Visit: Payer: Medicare Other

## 2022-02-11 NOTE — Therapy (Signed)
OUTPATIENT PHYSICAL THERAPY TREATMENT NOTE   Patient Name: Sharon Knight MRN: [id number] DOB:1946-02-08, 76 y.o., female Today's Date: 02/12/2022  PCP: Billey Gosling, MD  REFERRING PROVIDER: Dr. [name]  END OF SESSION:   PT End of Session - 02/12/22 1408     Visit Number 6    Number of Visits 16    Date for PT Re-Evaluation 03/18/22    Authorization Type BCBS    PT Start Time 1415    PT Stop Time 1500    PT Time Calculation (min) 45 min    Equipment Utilized During Treatment Other (comment)   quad cane   Activity Tolerance Patient tolerated treatment well    Behavior During Therapy WFL for tasks assessed/performed                 Past Medical History:  Diagnosis Date   Arthritis    Bilateral cataracts    Dr Kathrin Penner   Exertional dyspnea 12/19/2020   Hyperlipidemia    Hypertension    Past Surgical History:  Procedure Laterality Date   COLONOSCOPY     Dr Fuller Plan   NEPHRECTOMY     right; for cyst on kidney   OVARIAN CYST REMOVAL     Patient Active Problem List   Diagnosis Date Noted   Vertigo 10/23/2021   Exertional dyspnea 12/19/2020   Aortic atherosclerosis ([place]) 10/16/2020   Acute pain of left knee 09/16/2020   Trigger finger, right ring finger 03/17/2019   CKD stage 3, 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 03/16/2019   Diabetes mellitus without complication ([place]) 74/25/9563   Bilateral low back pain 03/17/2018   Gout 12/21/2017   Lobar pneumonia ([place]) 12/10/2017   Paroxysmal atrial fibrillation ([place]) 12/10/2017   History of colonic polyps 10/22/2014   History of nephrectomy, unilateral 11/13/2012   Vitamin D deficiency 10/31/2012   ECZEMA 03/03/2010   Benign neoplasm of colon 08/02/2007   Hyperlipidemia 08/02/2007   Essential hypertension 08/02/2007   Osteopenia 08/02/2007    REFERRING DIAG: M25.562,G89.29 (ICD-10-CM) - Chronic pain of left knee   THERAPY DIAG:  Chronic pain of left knee  Stiffness of left knee, not elsewhere  classified  Stiffness of right knee, not elsewhere classified  Muscle weakness (generalized)  Gait instability  Rationale for Evaluation and Treatment Rehabilitation   SUBJECTIVE:    SUBJECTIVE STATEMENT: Pt continues to report intermittent knee pain, but not is experiencing pain today. Pt notes she completes her HEP consistently. Pt reports decreased use of her SPC, but uses it as needed. Pt reports she is now able to bring in groceries from the car in 1 attempt vs 2 to 3 rest breaks.   PAIN:  Are you having pain? Yes: NPRS scale: 0/10 Pain location: L knee  Pain description: sore  Aggravating factors: standing , walking  Relieving factors: injection,   PERTINENT HISTORY:M25.562,G89.29 (ICD-10-CM) - Chronic pain of left knee    Gout, HTN, Diabetes, chronic knee pain, multi-factorial stress, Vertigo occ. , unsteady gait    PRECAUTIONS: None   WEIGHT BEARING RESTRICTIONS No   PATIENT GOALS Pt would like to have some more stamina.  I want to feel more confident with walking.      OBJECTIVE: (objective measures completed at initial evaluation unless otherwise dated)   DIAGNOSTIC FINDINGS:    MSK: Left knee mild effusion decreased range of motion pain with flexion and extension.  XR : 1. Moderate-to-severe tricompartmental degenerative changes, most pronounced in the medial and patellofemoral  compartments. 2. Trace suprapatellar joint effusion. 3. Chondrocalcinosis.   PATIENT SURVEYS:  FOTO 48%; 02/12/22= 47%   COGNITION:           Overall cognitive status: Within functional limits for tasks assessed                          SENSATION: WFL   EDEMA:  Circumferential: 16 3/8 inch each knee    MUSCLE LENGTH: Hamstrings: tight Thomas test: tight    POSTURE: rounded shoulders, forward head, increased thoracic kyphosis, and flexed trunk  Rt knee valgus    PALPATION: TTP medial L knee    LOWER EXTREMITY ROM:   Active ROM Right eval Left eval  Hip flexion       Hip extension 3 3  Hip abduction 3 3  Hip adduction      Hip internal rotation      Hip external rotation      Knee flexion 121 120  Knee extension 20 19  Ankle dorsiflexion      Ankle plantarflexion      Ankle inversion      Ankle eversion       (Blank rows = not tested)   LOWER EXTREMITY MMT:   MMT Right eval Left eval  Hip flexion 4+ 4  Hip extension 3 3  Hip abduction 3 3  Hip adduction      Hip internal rotation      Hip external rotation      Knee flexion 5 4+  Knee extension 5 4+  Ankle dorsiflexion      Ankle plantarflexion      Ankle inversion      Ankle eversion       (Blank rows = not tested)       FUNCTIONAL TESTS:  5 times sit to stand: 18.2 sec used hands from blue office chair; 5xSTS= 18.9" 30 seconds chair stand test Timed up and go (TUG): NT on eval  2 minute walk test: NT on eval    GAIT: Distance walked: 150 Assistive device utilized: Quad cane small base Level of assistance: Modified independence Comments: antalgic gait, slow velocity , wide BOS    TODAY'S TREATMENT: [facility] Adult PT Treatment:                                                DATE: 02/12/22 Therapeutic Exercise: Nu Step L6 5 mins UE/LE Single leg stands x3 20" each, 6-10" each LE Tandem stands x3 20" each foot forward Standing ankle DF/PF 2x10 Seated Long Arc Quad  2 sets - 10 reps - 3 hold 5# STS 2x5 c hands  Side lying hip abd 2 sets - 8 reps - 3 hold 5# Supine hip abd 2x15 BluTB Self Care: Pt completed FOTO and the results were reviewed with pt  [facility] Adult PT Treatment:                                                DATE: 02/05/22 Therapeutic Exercise: Supine Therex were completed c a wedge to minimize dizziness Nu Step L7 6 mins UE/LE Prolonged standing c feet a part >90"; feet together >90"; partial tandem x3 20'  each Seated Long Arc Quad  2 sets - 10 reps - 5 hold 4# STS x10 s hands  Standing hip abd 2 sets - 10 reps - 3 hold Standing hip ext 2 sets - 10 reps -  3 hold  [facility] Adult PT Treatment:                                                DATE: 02/03/22 Therapeutic Exercise: Supine Therex were completed c a wedge to minimize dizziness Nu Step L7 6 mins UE/LE Supine Bridge 2x10 reps 5 hold Supine Lower Trunk Rotation  10 reps - 10 hold Supine Active Straight Leg Raise 2 sets - 10 reps each 2# L Seated Long Arc Quad  2 sets - 10 reps - 5 hold 4# STS x10 s hands  Supine Hip clams 2 sets - 10 reps - 5 hold   PATIENT EDUCATION:  Education details: HEP, POC  Person educated: Patient Education method: Explanation and Handouts Education comprehension: verbalized understanding and needs further education     HOME EXERCISE PROGRAM: Access Code: [email or web address] URL: [email or web address] Date: 02/12/2022 Prepared by: Gar Ponto  Exercises - Supine Bridge  - 1 x daily - 7 x weekly - 2 sets - 10 reps - 5 hold - Supine Lower Trunk Rotation  - 1 x daily - 7 x weekly - 2 sets - 10 reps - 10 hold - Supine Active Straight Leg Raise  - 1 x daily - 7 x weekly - 2 sets - 10 reps - Seated Long Arc Quad  - 1 x daily - 7 x weekly - 2 sets - 10 reps - 5 hold - Sidelying Hip Abduction  - 1 x daily - 7 x weekly - 2 sets - 10 reps - 5 hold - Hooklying Clamshell with Resistance  - 1 x daily - 7 x weekly - 3 sets - 10 reps - 3 hold - Standing Single Leg Stance with Counter Support  - 1 x daily - 7 x weekly - 1 sets - 5 reps - 20 hold - Standing Tandem Balance with Counter Support  - 1 x daily - 7 x weekly - 1 sets - 5 reps - 20 hold   ASSESSMENT:   CLINICAL IMPRESSION: PT was completed for LE strengthening and balance. Therex was completed in both the open and closed kinetic chain. Pt tolerated the session without increase in bilat knee pain. Pt's FOTO and 5xSTS were reassessed and found not to be improved since the eval. Pt subjectively noted improved ability to complete her daily activities specifically reporting greater ease with brining in groceries  from the car. Being able to do so in 1 attempt vs with 2 to 3 rest breaks. Anticipate slower functional progress due to her chronic arthritic condition of pt knees. Pt will continue to benefit from skilled PT to address impairments for improved function  OBJECTIVE IMPAIRMENTS Abnormal gait, decreased activity tolerance, decreased balance, decreased cognition, decreased endurance, decreased knowledge of use of DME, decreased mobility, difficulty walking, decreased ROM, decreased strength, increased edema, increased fascial restrictions, impaired flexibility, postural dysfunction, and pain.    ACTIVITY LIMITATIONS carrying, lifting, bending, sitting, standing, squatting, sleeping, stairs, transfers, bed mobility, locomotion level, and caring for others   PARTICIPATION LIMITATIONS: meal prep, cleaning, laundry, shopping, community activity, yard work, and     PERSONAL FACTORS  Behavior pattern, Past/current experiences, Time since onset of injury/illness/exacerbation, and 3+ comorbidities: OA, diabetes, stress  are also affecting patient's functional outcome.     GOALS: LONG TERM GOALS: Target date: 03/04/2022    Pt will be able to stand without UEs x 5 in < 20 sec to demo improved functional strength  Baseline:  Status: 02/05/22=Standing feet a part and together 90" each Goal status: Met   2.  Pt will be able to stand, walk in the community for 20 min without undue fatigue Baseline:  Goal status: INITIAL   3.  Pt will be able to show I with HEP for hip and knee mobility, strength  Baseline:  Status: pt demonstrates proper completion of HEP and reports completing daily Goal status: Ongoing   4.  Pt will be able to increase hip strength to at least 4/5 to optimize gait stability  Baseline:  Goal status: INITIAL   5.  Pt will ambulate with cane and improved stride length, trunk and hip extension when cued.  Baseline:  Goal status: INITIAL   PLAN: PT FREQUENCY: 2x/week   PT DURATION: 8  weeks   PLANNED INTERVENTIONS: Therapeutic exercises, Therapeutic activity, Neuromuscular re-education, Balance training, Gait training, Patient/Family education, Self Care, Joint mobilization, DME instructions, Aquatic Therapy, Cryotherapy, Moist heat, Taping, Manual therapy, and Re-evaluation   PLAN FOR NEXT SESSION: HEP, Nustep, hip strength, ant hip stretching. Consider seated exs if pt continues to have an issue with lying supine and turning to her sides. Reassess LE strength.   Jerricka Carvey MS, PT 02/12/22 4:23 PM

## 2022-02-12 ENCOUNTER — Ambulatory Visit: Payer: Medicare Other

## 2022-02-12 DIAGNOSIS — R2681 Unsteadiness on feet: Secondary | ICD-10-CM

## 2022-02-12 DIAGNOSIS — M6281 Muscle weakness (generalized): Secondary | ICD-10-CM

## 2022-02-12 DIAGNOSIS — M25662 Stiffness of left knee, not elsewhere classified: Secondary | ICD-10-CM

## 2022-02-12 DIAGNOSIS — M25661 Stiffness of right knee, not elsewhere classified: Secondary | ICD-10-CM | POA: Diagnosis not present

## 2022-02-12 DIAGNOSIS — G8929 Other chronic pain: Secondary | ICD-10-CM | POA: Diagnosis not present

## 2022-02-12 DIAGNOSIS — M25561 Pain in right knee: Secondary | ICD-10-CM | POA: Diagnosis not present

## 2022-02-12 DIAGNOSIS — M25562 Pain in left knee: Secondary | ICD-10-CM | POA: Diagnosis not present

## 2022-02-16 DIAGNOSIS — Z961 Presence of intraocular lens: Secondary | ICD-10-CM | POA: Diagnosis not present

## 2022-02-16 DIAGNOSIS — H35353 Cystoid macular degeneration, bilateral: Secondary | ICD-10-CM | POA: Diagnosis not present

## 2022-02-16 DIAGNOSIS — H3582 Retinal ischemia: Secondary | ICD-10-CM | POA: Diagnosis not present

## 2022-02-16 DIAGNOSIS — E113291 Type 2 diabetes mellitus with mild nonproliferative diabetic retinopathy without macular edema, right eye: Secondary | ICD-10-CM | POA: Diagnosis not present

## 2022-02-17 ENCOUNTER — Ambulatory Visit: Payer: Medicare Other

## 2022-02-17 DIAGNOSIS — M6281 Muscle weakness (generalized): Secondary | ICD-10-CM

## 2022-02-17 DIAGNOSIS — R2681 Unsteadiness on feet: Secondary | ICD-10-CM

## 2022-02-17 DIAGNOSIS — M25662 Stiffness of left knee, not elsewhere classified: Secondary | ICD-10-CM | POA: Diagnosis not present

## 2022-02-17 DIAGNOSIS — G8929 Other chronic pain: Secondary | ICD-10-CM

## 2022-02-17 DIAGNOSIS — M25561 Pain in right knee: Secondary | ICD-10-CM | POA: Diagnosis not present

## 2022-02-17 DIAGNOSIS — M25661 Stiffness of right knee, not elsewhere classified: Secondary | ICD-10-CM | POA: Diagnosis not present

## 2022-02-17 DIAGNOSIS — M25562 Pain in left knee: Secondary | ICD-10-CM | POA: Diagnosis not present

## 2022-02-17 NOTE — Therapy (Signed)
OUTPATIENT PHYSICAL THERAPY TREATMENT NOTE   Patient Name: Sharon Knight MRN: [id number] DOB:[birth year], 76 y.o., female Today's Date: 02/17/2022  PCP: Sharon Gosling, MD  REFERRING PROVIDER: Dr. Lynne Knight  END OF SESSION:   PT End of Session - 02/17/22 1436     Visit Number 7    Number of Visits 16    Date for PT Re-Evaluation 03/18/22    Authorization Type BCBS    PT Start Time 1422    PT Stop Time 1505    PT Time Calculation (min) 43 min    Equipment Utilized During Treatment Other (comment)   quad cane   Activity Tolerance Patient tolerated treatment well    Behavior During Therapy [facility] for tasks assessed/performed                  Past Medical History:  Diagnosis Date   Arthritis    Bilateral cataracts    Dr Sharon Knight   Exertional dyspnea 12/19/2020   Hyperlipidemia    Hypertension    Past Surgical History:  Procedure Laterality Date   COLONOSCOPY     Dr Sharon Knight   NEPHRECTOMY     right; for cyst on kidney   OVARIAN CYST REMOVAL     Patient Active Problem List   Diagnosis Date Noted   Vertigo 10/23/2021   Exertional dyspnea 12/19/2020   Aortic atherosclerosis ([place]) 10/16/2020   Acute pain of left knee 09/16/2020   Trigger finger, right ring finger 03/17/2019   CKD stage 3, GFR 30-59 ml/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 03/16/2019   Diabetes mellitus without complication ([place]) 19/62/2297   Bilateral low back pain 03/17/2018   Gout 12/21/2017   Lobar pneumonia ([place]) 12/10/2017   Paroxysmal atrial fibrillation ([place]) 12/10/2017   History of colonic polyps 10/22/2014   History of nephrectomy, unilateral 11/13/2012   Vitamin D deficiency 10/31/2012   ECZEMA 03/03/2010   Benign neoplasm of colon 08/02/2007   Hyperlipidemia 08/02/2007   Essential hypertension 08/02/2007   Osteopenia 08/02/2007    REFERRING DIAG: M25.562,G89.29 (ICD-10-CM) - Chronic pain of left knee   THERAPY DIAG:  Chronic pain of left knee  Stiffness of left knee, not elsewhere  classified  Stiffness of right knee, not elsewhere classified  Muscle weakness (generalized)  Gait instability  Rationale for Evaluation and Treatment Rehabilitation   SUBJECTIVE:    SUBJECTIVE STATEMENT: Pt reports she feels fatigued today. My knees don't bother me nearly as bad.   PAIN:  Are you having pain? Yes: NPRS scale: 0/10 Pain location: L knee  Pain description: sore  Aggravating factors: standing , walking  Relieving factors: injection,   PERTINENT HISTORY:M25.562,G89.29 (ICD-10-CM) - Chronic pain of left knee    Gout, HTN, Diabetes, chronic knee pain, multi-factorial stress, Vertigo occ. , unsteady gait    PRECAUTIONS: None   WEIGHT BEARING RESTRICTIONS No   PATIENT GOALS Pt would like to have some more stamina.  I want to feel more confident with walking.      OBJECTIVE: (objective measures completed at initial evaluation unless otherwise dated)   DIAGNOSTIC FINDINGS:    MSK: Left knee mild effusion decreased range of motion pain with flexion and extension.  XR : 1. Moderate-to-severe tricompartmental degenerative changes, most pronounced in the medial and patellofemoral compartments. 2. Trace suprapatellar joint effusion. 3. Chondrocalcinosis.   PATIENT SURVEYS:  FOTO 48%; 02/12/22= 47%   COGNITION:           Overall cognitive status: Within functional limits for tasks assessed  SENSATION: WFL   EDEMA:  Circumferential: 16 3/8 inch each knee    MUSCLE LENGTH: Hamstrings: tight Thomas test: tight    POSTURE: rounded shoulders, forward head, increased thoracic kyphosis, and flexed trunk  Rt knee valgus    PALPATION: TTP medial L knee    LOWER EXTREMITY ROM:   Active ROM Right eval Left eval  Hip flexion      Hip extension 3 3  Hip abduction 3 3  Hip adduction      Hip internal rotation      Hip external rotation      Knee flexion 121 120  Knee extension 20 19  Ankle dorsiflexion      Ankle  plantarflexion      Ankle inversion      Ankle eversion       (Blank rows = not tested)   LOWER EXTREMITY MMT:   MMT Right eval Left eval  Hip flexion 4+ 4  Hip extension 3 3  Hip abduction 3 3  Hip adduction      Hip internal rotation      Hip external rotation      Knee flexion 5 4+  Knee extension 5 4+  Ankle dorsiflexion      Ankle plantarflexion      Ankle inversion      Ankle eversion       (Blank rows = not tested)       FUNCTIONAL TESTS:  5 times sit to stand: 18.2 sec used hands from blue office chair; 5xSTS= 18.9" 30 seconds chair stand test Timed up and go (TUG): NT on eval  2 minute walk test: NT on eval    GAIT: Distance walked: 150 Assistive device utilized: Quad cane small base Level of assistance: Modified independence Comments: antalgic gait, slow velocity , wide BOS    TODAY'S TREATMENT: [facility] Adult PT Treatment:                                                DATE: 02/17/22 Therapeutic Exercise: NuStep L4 5 mins LE Single leg stands x3 30" use of hands as needed Tandem stands x3 20" each foot forward, use of hands as needed Standing ankle DF/PF 2x10  Seated Long Arc Quad  2 sets - 10 reps - 3 hold 5# STS x10 s hands  Side lying hip abd 2 sets - 8 reps - 3 hold 5# Supine hip SLR 2x10 3# Standing hip abd 2x10 3#  [facility] Adult PT Treatment:                                                DATE: 02/12/22 Therapeutic Exercise: Nu Step L6 5 mins UE/LE Single leg stands x3 20" each, 6-10" each LE Tandem stands x3 20" each foot forward Standing ankle DF/PF 2x10 Seated Long Arc Quad  2 sets - 10 reps - 3 hold 5# STS 2x5 c hands  Side lying hip abd 2 sets - 8 reps - 3 hold 5# Supine hip abd 2x15 BluTB Self Care: Pt completed FOTO and the results were reviewed with pt  [facility] Adult PT Treatment:  DATE: 02/05/22 Therapeutic Exercise: Supine Therex were completed c a wedge to minimize dizziness Nu Step L7 6  mins UE/LE Prolonged standing c feet a part >90"; feet together >90"; partial tandem x3 20' each Seated Long Arc Quad  2 sets - 10 reps - 5 hold 4# STS x10 s hands  Standing hip abd 2 sets - 10 reps - 3 hold Standing hip ext 2 sets - 10 reps - 3 hold   PATIENT EDUCATION:  Education details: HEP, POC  Person educated: Patient Education method: Explanation and Handouts Education comprehension: verbalized understanding and needs further education     HOME EXERCISE PROGRAM: Access Code: [email or web address] URL: [email or web address] Date: 02/12/2022 Prepared by: Gar Ponto  Exercises - Supine Bridge  - 1 x daily - 7 x weekly - 2 sets - 10 reps - 5 hold - Supine Lower Trunk Rotation  - 1 x daily - 7 x weekly - 2 sets - 10 reps - 10 hold - Supine Active Straight Leg Raise  - 1 x daily - 7 x weekly - 2 sets - 10 reps - Seated Long Arc Quad  - 1 x daily - 7 x weekly - 2 sets - 10 reps - 5 hold - Sidelying Hip Abduction  - 1 x daily - 7 x weekly - 2 sets - 10 reps - 5 hold - Hooklying Clamshell with Resistance  - 1 x daily - 7 x weekly - 3 sets - 10 reps - 3 hold - Standing Single Leg Stance with Counter Support  - 1 x daily - 7 x weekly - 1 sets - 5 reps - 20 hold - Standing Tandem Balance with Counter Support  - 1 x daily - 7 x weekly - 1 sets - 5 reps - 20 hold   ASSESSMENT:   CLINICAL IMPRESSION: Pt was completed for balance and LE strength therex. With balance therex pt needed light hand A most of the time for assist. Pt tolerated PT today without adverse effects. Assess LE strength the next PT session.  OBJECTIVE IMPAIRMENTS Abnormal gait, decreased activity tolerance, decreased balance, decreased cognition, decreased endurance, decreased knowledge of use of DME, decreased mobility, difficulty walking, decreased ROM, decreased strength, increased edema, increased fascial restrictions, impaired flexibility, postural dysfunction, and pain.    ACTIVITY LIMITATIONS carrying,  lifting, bending, sitting, standing, squatting, sleeping, stairs, transfers, bed mobility, locomotion level, and caring for others   PARTICIPATION LIMITATIONS: meal prep, cleaning, laundry, shopping, community activity, yard work, and     PERSONAL FACTORS Behavior pattern, Past/current experiences, Time since onset of injury/illness/exacerbation, and 3+ comorbidities: OA, diabetes, stress  are also affecting patient's functional outcome.     GOALS: LONG TERM GOALS: Target date: 03/04/2022    Pt will be able to stand without UEs x 5 in < 20 sec to demo improved functional strength  Baseline:  Status: 02/05/22=Standing feet a part and together 90" each Goal status: Met   2.  Pt will be able to stand, walk in the community for 20 min without undue fatigue Baseline:  Goal status: INITIAL   3.  Pt will be able to show I with HEP for hip and knee mobility, strength  Baseline:  Status: pt demonstrates proper completion of HEP and reports completing daily Goal status: Ongoing   4.  Pt will be able to increase hip strength to at least 4/5 to optimize gait stability  Baseline:  Goal status: INITIAL   5.  Pt will ambulate with cane and  improved stride length, trunk and hip extension when cued.  Baseline:  Goal status: INITIAL   Knight: PT FREQUENCY: 2x/week   PT DURATION: 8 weeks   PLANNED INTERVENTIONS: Therapeutic exercises, Therapeutic activity, Neuromuscular re-education, Balance training, Gait training, Patient/Family education, Self Care, Joint mobilization, DME instructions, Aquatic Therapy, Cryotherapy, Moist heat, Taping, Manual therapy, and Re-evaluation   Knight FOR NEXT SESSION: HEP, Nustep, hip strength, ant hip stretching. Consider seated exs if pt continues to have an issue with lying supine and turning to her sides. Reassess LE strength.   Jannessa Ogden MS, PT 02/17/22 3:35 PM

## 2022-02-18 NOTE — Therapy (Signed)
OUTPATIENT PHYSICAL THERAPY TREATMENT NOTE   Patient Name: Sharon Knight MRN: [id number] DOB:06-30-1945, 76 y.o., female Today's Date: 02/19/2022  PCP: Billey Gosling, MD  REFERRING PROVIDER: Dr. [name]  END OF SESSION:   PT End of Session - 02/19/22 1424     Visit Number 8    Number of Visits 16    Date for PT Re-Evaluation 03/18/22    Authorization Type BCBS    PT Start Time 1417    PT Stop Time 1500    PT Time Calculation (min) 43 min    Equipment Utilized During Treatment Other (comment)   quad cane   Activity Tolerance Patient tolerated treatment well    Behavior During Therapy [facility] for tasks assessed/performed                   Past Medical History:  Diagnosis Date   Arthritis    Bilateral cataracts    Dr Kathrin Penner   Exertional dyspnea 12/19/2020   Hyperlipidemia    Hypertension    Past Surgical History:  Procedure Laterality Date   COLONOSCOPY     Dr Fuller Plan   NEPHRECTOMY     right; for cyst on kidney   OVARIAN CYST REMOVAL     Patient Active Problem List   Diagnosis Date Noted   Vertigo 10/23/2021   Exertional dyspnea 12/19/2020   Aortic atherosclerosis ([place]) 10/16/2020   Acute pain of left knee 09/16/2020   Trigger finger, right ring finger 03/17/2019   CKD stage 3, GFR 30-59 ml/min ([place]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 03/16/2019   Diabetes mellitus without complication ([place]) 26/37/8588   Bilateral low back pain 03/17/2018   Gout 12/21/2017   Lobar pneumonia ([place]) 12/10/2017   Paroxysmal atrial fibrillation ([place]) 12/10/2017   History of colonic polyps 10/22/2014   History of nephrectomy, unilateral 11/13/2012   Vitamin D deficiency 10/31/2012   ECZEMA 03/03/2010   Benign neoplasm of colon 08/02/2007   Hyperlipidemia 08/02/2007   Essential hypertension 08/02/2007   Osteopenia 08/02/2007    REFERRING DIAG: M25.562,G89.29 (ICD-10-CM) - Chronic pain of left knee   THERAPY DIAG:  Chronic pain of left knee  Stiffness of left knee, not elsewhere  classified  Stiffness of right knee, not elsewhere classified  Muscle weakness (generalized)  Gait instability  Chronic pain of right knee  Rationale for Evaluation and Treatment Rehabilitation   SUBJECTIVE:    SUBJECTIVE STATEMENT: Pt reports she is doing well today. No knee pain currently. Pt notes she is getting around her home better. Pt reports experiencing thigh soreness the day after the last PT session.  PAIN:  Are you having pain? Yes: NPRS scale: 0/10 Pain location: L knee  Pain description: sore  Aggravating factors: standing , walking  Relieving factors: injection,   PERTINENT HISTORY:M25.562,G89.29 (ICD-10-CM) - Chronic pain of left knee    Gout, HTN, Diabetes, chronic knee pain, multi-factorial stress, Vertigo occ. , unsteady gait    PRECAUTIONS: None   WEIGHT BEARING RESTRICTIONS No   PATIENT GOALS Pt would like to have some more stamina.  I want to feel more confident with walking.      OBJECTIVE: (objective measures completed at initial evaluation unless otherwise dated)   DIAGNOSTIC FINDINGS:    MSK: Left knee mild effusion decreased range of motion pain with flexion and extension.  XR : 1. Moderate-to-severe tricompartmental degenerative changes, most pronounced in the medial and patellofemoral compartments. 2. Trace suprapatellar joint effusion. 3. Chondrocalcinosis.   PATIENT SURVEYS:  FOTO 48%;  02/12/22= 47%   COGNITION:           Overall cognitive status: Within functional limits for tasks assessed                          SENSATION: WFL   EDEMA:  Circumferential: 16 3/8 inch each knee    MUSCLE LENGTH: Hamstrings: tight Thomas test: tight    POSTURE: rounded shoulders, forward head, increased thoracic kyphosis, and flexed trunk  Rt knee valgus    PALPATION: TTP medial L knee    LOWER EXTREMITY ROM:   Active ROM Right eval Left eval  Hip flexion      Hip extension 3 3  Hip abduction 3 3  Hip adduction      Hip  internal rotation      Hip external rotation      Knee flexion 121 120  Knee extension 20 19  Ankle dorsiflexion      Ankle plantarflexion      Ankle inversion      Ankle eversion       (Blank rows = not tested)   LOWER EXTREMITY MMT:   MMT Right eval Left eval Rt 02/19/22 Lt 02/1922  Hip flexion 4+ 4 4+ 4  Hip extension '3 3 4 4  ' Hip abduction 3 3 4+ 4+  Hip adduction        Hip internal rotation        Hip external rotation     4+ 4+  Knee flexion 5 4+ 5 5  Knee extension 5 4+ 5 5  Ankle dorsiflexion        Ankle plantarflexion        Ankle inversion        Ankle eversion         (Blank rows = not tested)       FUNCTIONAL TESTS:  5 times sit to stand: 18.2 sec used hands from blue office chair; 5xSTS= 18.9" 30 seconds chair stand test Timed up and go (TUG): NT on eval  2 minute walk test: NT on eval    GAIT: Distance walked: 150 Assistive device utilized: Quad cane small base Level of assistance: Modified independence Comments: antalgic gait, slow velocity , wide BOS    TODAY'S TREATMENT: [facility] Adult PT Treatment:                                                DATE: 02/19/22 Therapeutic Exercise: NuStep L4 5 mins LE Seated Long Arc Quad  2 sets - 10 reps - 3 hold 5 STS x10 10 Standing hip abd 2x10 5# Standing hip aext 2x10 5# Standing knee flex 2x10 3# Standing ankle DF/PF 2x10 Single leg stands x3 30" use of hands as needed  [facility] Adult PT Treatment:                                                DATE: 02/17/22 Therapeutic Exercise: NuStep L4 5 mins LE Single leg stands x3 30" use of hands as needed Tandem stands x3 20" each foot forward, use of hands as needed Standing ankle DF/PF 2x10  Seated Long Arc Quad  2 sets - 10  reps - 3 hold 5# STS x10 s hands  Side lying hip abd 2 sets - 8 reps - 3 hold 5# Supine hip SLR 2x10 3# Standing hip abd 2x10 3#  [facility] Adult PT Treatment:                                                DATE: 02/12/22 Therapeutic  Exercise: Nu Step L6 5 mins UE/LE Single leg stands x3 20" each, 6-10" each LE Tandem stands x3 20" each foot forward Standing ankle DF/PF 2x10 Seated Long Arc Quad  2 sets - 10 reps - 3 hold 5# STS 2x5 c hands  Side lying hip abd 2 sets - 8 reps - 3 hold 5# Supine hip abd 2x15 BluTB Self Care: Pt completed FOTO and the results were reviewed with pt  PATIENT EDUCATION:  Education details: HEP, POC  Person educated: Patient Education method: Explanation and Handouts Education comprehension: verbalized understanding and needs further education     HOME EXERCISE PROGRAM: Access Code: [email or web address] URL: [email or web address] Date: 02/12/2022 Prepared by: Gar Ponto  Exercises - Supine Bridge  - 1 x daily - 7 x weekly - 2 sets - 10 reps - 5 hold - Supine Lower Trunk Rotation  - 1 x daily - 7 x weekly - 2 sets - 10 reps - 10 hold - Supine Active Straight Leg Raise  - 1 x daily - 7 x weekly - 2 sets - 10 reps - Seated Long Arc Quad  - 1 x daily - 7 x weekly - 2 sets - 10 reps - 5 hold - Sidelying Hip Abduction  - 1 x daily - 7 x weekly - 2 sets - 10 reps - 5 hold - Hooklying Clamshell with Resistance  - 1 x daily - 7 x weekly - 3 sets - 10 reps - 3 hold - Standing Single Leg Stance with Counter Support  - 1 x daily - 7 x weekly - 1 sets - 5 reps - 20 hold - Standing Tandem Balance with Counter Support  - 1 x daily - 7 x weekly - 1 sets - 5 reps - 20 hold   ASSESSMENT:   CLINICAL IMPRESSION: Pt report indicates improve walking tolerance and the ability to walk within her home. LE strength was reassess with pt making good gain and meeting goal #4.Pt is making gradual, but appropriate gains in PT. Pt will continue to benefit from skilled PT to address impairments for improved function  OBJECTIVE IMPAIRMENTS Abnormal gait, decreased activity tolerance, decreased balance, decreased cognition, decreased endurance, decreased knowledge of use of DME, decreased mobility, difficulty  walking, decreased ROM, decreased strength, increased edema, increased fascial restrictions, impaired flexibility, postural dysfunction, and pain.    ACTIVITY LIMITATIONS carrying, lifting, bending, sitting, standing, squatting, sleeping, stairs, transfers, bed mobility, locomotion level, and caring for others   PARTICIPATION LIMITATIONS: meal prep, cleaning, laundry, shopping, community activity, yard work, and     PERSONAL FACTORS Behavior pattern, Past/current experiences, Time since onset of injury/illness/exacerbation, and 3+ comorbidities: OA, diabetes, stress  are also affecting patient's functional outcome.     GOALS: LONG TERM GOALS: Target date: 03/04/2022    Pt will be able to stand without UEs x 5 in < 20 sec to demo improved functional strength  Baseline:  Status: 02/05/22=Standing feet a part and together 90" each Goal  status: Met   2.  Pt will be able to stand, walk in the community for 20 min without undue fatigue Baseline:  Status: 02/19/22: Pt reports being able to walk in a grocery store c a cart for 20 mins Goal status: Improving   3.  Pt will be able to show I with HEP for hip and knee mobility, strength  Baseline:  Status: pt demonstrates proper completion of HEP and reports completing daily Goal status: Ongoing   4.  Pt will be able to increase hip strength to at least 4/5 to optimize gait stability  Baseline:  Status: 02/19/22=See flow sheets Goal status: MET   5.  Pt will ambulate with cane and improved stride length, trunk and hip extension when cued.  Baseline:  Goal status: INITIAL   PLAN: PT FREQUENCY: 2x/week   PT DURATION: 8 weeks   PLANNED INTERVENTIONS: Therapeutic exercises, Therapeutic activity, Neuromuscular re-education, Balance training, Gait training, Patient/Family education, Self Care, Joint mobilization, DME instructions, Aquatic Therapy, Cryotherapy, Moist heat, Taping, Manual therapy, and Re-evaluation   PLAN FOR NEXT SESSION: HEP,  Nustep, hip strength, ant hip stretching. Consider seated exs if pt continues to have an issue with lying supine and turning to her sides.   Rayma Hegg MS, PT 02/19/22 5:06 PM

## 2022-02-19 ENCOUNTER — Ambulatory Visit: Payer: Medicare Other

## 2022-02-19 DIAGNOSIS — M6281 Muscle weakness (generalized): Secondary | ICD-10-CM

## 2022-02-19 DIAGNOSIS — M25662 Stiffness of left knee, not elsewhere classified: Secondary | ICD-10-CM

## 2022-02-19 DIAGNOSIS — R2681 Unsteadiness on feet: Secondary | ICD-10-CM

## 2022-02-19 DIAGNOSIS — M25661 Stiffness of right knee, not elsewhere classified: Secondary | ICD-10-CM

## 2022-02-19 DIAGNOSIS — M25562 Pain in left knee: Secondary | ICD-10-CM | POA: Diagnosis not present

## 2022-02-19 DIAGNOSIS — G8929 Other chronic pain: Secondary | ICD-10-CM | POA: Diagnosis not present

## 2022-02-19 DIAGNOSIS — M25561 Pain in right knee: Secondary | ICD-10-CM | POA: Diagnosis not present

## 2022-02-23 NOTE — Progress Notes (Signed)
   I, Sharon Knight, LAT, ATC acting as a scribe for Sharon Leader, MD.  Sharon Knight is a 76 y.o. female who presents to [facility] today for continued left knee pain. Pt has had gout flares in the past but decline to take allopurinol and instead adjusted her diet and has been taking tart cherry extract. Patient was last seen by Dr. Georgina Snell on 01/16/2022 and was given a left knee steroid injection and was referred to PT for gait and fall prevention, completing 8 visits.  Today, patient reports L knee is doing much better. Pt has been working on exercises and has been enjoying PT.   Dx testing: 01/16/22 L knee XR 10/23/21 Uric acid = 7.4 09/16/20 L knee XR 04/17/21 Uric acid = 8.4             10/14/20 Uric acid = 7.3   Pertinent review of systems: No fevers or chills  Relevant historical information: Aortic atherosclerosis.  CKD.  Diabetes.   Exam:  BP (!) 148/80   Pulse 61   Ht '5\' 5"'$  (1.651 m)   Wt 172 lb (78 kg)   SpO2 98%   BMI 28.62 kg/m  General: Well Developed, well nourished, and in no acute distress.   MSK: Left knee normal motion.  Intact strength. Gait: She is able to stand from a chair without pushoff.  She uses a cane to ambulate but gait is smooth without any near misses or trips.    Lab and Radiology Results  EXAM: LEFT KNEE 3 VIEWS   COMPARISON:  09/16/2020.   FINDINGS: No acute fracture or dislocation. There are moderate-to-severe tricompartmental degenerative changes, most pronounced in the medial and patellofemoral compartments. A trace suprapatellar joint effusion is noted. Chondrocalcinosis is noted. Vascular calcifications are noted in the soft tissues.   IMPRESSION: 1. Moderate-to-severe tricompartmental degenerative changes, most pronounced in the medial and patellofemoral compartments. 2. Trace suprapatellar joint effusion. 3. Chondrocalcinosis.     Electronically Signed   By: Brett Fairy M.D.   On: 01/18/2022  00:18 I, Sharon Knight, personally (independently) visualized and performed the interpretation of the images attached in this note.      Assessment and Plan: 76 y.o. female with left knee pain due to exacerbation of DJD.  Pain has improved significantly following steroid injection.  She is continuing physical therapy to work on leg strengthening for knee pain.  However the main goal of physical therapy is for leg strength and gait training to reduce fall risk which has been very effective so far.  She had a dramatic improvement in gait and reduction in fall risk.  She has more physical therapy to go.  Plan to continue PT and home exercise program.  Recheck back with me as needed.  I can proceed with repeat steroid injection as soon as 3 months which would be December 15 or later.    Discussed warning signs or symptoms. Please see discharge instructions. Patient expresses understanding.   The above documentation has been reviewed and is accurate and complete Sharon Knight, M.D.  Total encounter time 20 minutes including face-to-face time with the patient and, reviewing past medical record, and charting on the date of service.

## 2022-02-24 ENCOUNTER — Ambulatory Visit: Payer: Medicare Other | Admitting: Family Medicine

## 2022-02-24 ENCOUNTER — Ambulatory Visit: Payer: Medicare Other

## 2022-02-24 VITALS — BP 148/80 | HR 61 | Ht 65.0 in | Wt 172.0 lb

## 2022-02-24 DIAGNOSIS — G8929 Other chronic pain: Secondary | ICD-10-CM | POA: Diagnosis not present

## 2022-02-24 DIAGNOSIS — M25662 Stiffness of left knee, not elsewhere classified: Secondary | ICD-10-CM | POA: Diagnosis not present

## 2022-02-24 DIAGNOSIS — R2681 Unsteadiness on feet: Secondary | ICD-10-CM

## 2022-02-24 DIAGNOSIS — M6281 Muscle weakness (generalized): Secondary | ICD-10-CM | POA: Diagnosis not present

## 2022-02-24 DIAGNOSIS — M25661 Stiffness of right knee, not elsewhere classified: Secondary | ICD-10-CM

## 2022-02-24 DIAGNOSIS — M25561 Pain in right knee: Secondary | ICD-10-CM | POA: Diagnosis not present

## 2022-02-24 DIAGNOSIS — M25562 Pain in left knee: Secondary | ICD-10-CM | POA: Diagnosis not present

## 2022-02-24 NOTE — Patient Instructions (Signed)
Thank you for coming in today.   Continue exercises.   Recheck with me as needed.   I can re-do the injection middle December if needed.

## 2022-02-24 NOTE — Therapy (Signed)
OUTPATIENT PHYSICAL THERAPY TREATMENT NOTE/Progress Note   Patient Name: Sharon Knight MRN: [id number] DOB:01-11-1946, 76 y.o., female Today's Date: 02/25/2022  PCP: Billey Gosling, MD  REFERRING PROVIDER: Dr. [name]  Progress Note Reporting Period 01/2022 to 02/25/22  See note below for Objective Data and Assessment of Progress/Goals.      END OF SESSION:   PT End of Session - 02/25/22 1427     Visit Number 10    Number of Visits 16    Date for PT Re-Evaluation 03/18/22    Authorization Type BCBS    PT Start Time 1420    PT Stop Time 1500    PT Time Calculation (min) 40 min    Equipment Utilized During Treatment Other (comment)   quad cane   Activity Tolerance Patient tolerated treatment well    Behavior During Therapy WFL for tasks assessed/performed                     Past Medical History:  Diagnosis Date   Arthritis    Bilateral cataracts    Dr Kathrin Penner   Exertional dyspnea 12/19/2020   Hyperlipidemia    Hypertension    Past Surgical History:  Procedure Laterality Date   COLONOSCOPY     Dr Fuller Plan   NEPHRECTOMY     right; for cyst on kidney   OVARIAN CYST REMOVAL     Patient Active Problem List   Diagnosis Date Noted   Vertigo 10/23/2021   Exertional dyspnea 12/19/2020   Aortic atherosclerosis ([place]) 10/16/2020   Acute pain of left knee 09/16/2020   Trigger finger, right ring finger 03/17/2019   CKD stage 3, 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 03/16/2019   Diabetes mellitus without complication ([place]) 09/73/5329   Bilateral low back pain 03/17/2018   Gout 12/21/2017   Lobar pneumonia ([place]) 12/10/2017   Paroxysmal atrial fibrillation ([place]) 12/10/2017   History of colonic polyps 10/22/2014   History of nephrectomy, unilateral 11/13/2012   Vitamin D deficiency 10/31/2012   ECZEMA 03/03/2010   Benign neoplasm of colon 08/02/2007   Hyperlipidemia 08/02/2007   Essential hypertension 08/02/2007   Osteopenia 08/02/2007    REFERRING  DIAG: M25.562,G89.29 (ICD-10-CM) - Chronic pain of left knee   THERAPY DIAG:  Chronic pain of left knee  Stiffness of left knee, not elsewhere classified  Stiffness of right knee, not elsewhere classified  Muscle weakness (generalized)  Gait instability  Chronic pain of right knee  Rationale for Evaluation and Treatment Rehabilitation   SUBJECTIVE:    SUBJECTIVE STATEMENT: Pt reports she is having thigh soreness today after yesterdays session PAIN:  Are you having pain? Yes: NPRS scale: 0/10 Pain location: L knee  Pain description: sore  Aggravating factors: standing , walking  Relieving factors: injection,   PERTINENT HISTORY:M25.562,G89.29 (ICD-10-CM) - Chronic pain of left knee    Gout, HTN, Diabetes, chronic knee pain, multi-factorial stress, Vertigo occ. , unsteady gait    PRECAUTIONS: None   WEIGHT BEARING RESTRICTIONS No   PATIENT GOALS Pt would like to have some more stamina.  I want to feel more confident with walking.      OBJECTIVE: (objective measures completed at initial evaluation unless otherwise dated)   DIAGNOSTIC FINDINGS:    MSK: Left knee mild effusion decreased range of motion pain with flexion and extension.  XR : 1. Moderate-to-severe tricompartmental degenerative changes, most pronounced in the medial and patellofemoral compartments. 2. Trace suprapatellar joint effusion. 3. Chondrocalcinosis.   PATIENT  SURVEYS:  FOTO 48%; 02/12/22= 47%   COGNITION:           Overall cognitive status: Within functional limits for tasks assessed                          SENSATION: WFL   EDEMA:  Circumferential: 16 3/8 inch each knee    MUSCLE LENGTH: Hamstrings: tight Thomas test: tight    POSTURE: rounded shoulders, forward head, increased thoracic kyphosis, and flexed trunk  Rt knee valgus    PALPATION: TTP medial L knee    LOWER EXTREMITY ROM:   Active ROM Right eval Left eval  Hip flexion      Hip extension 3 3  Hip abduction 3  3  Hip adduction      Hip internal rotation      Hip external rotation      Knee flexion 121 120  Knee extension 20 19  Ankle dorsiflexion      Ankle plantarflexion      Ankle inversion      Ankle eversion       (Blank rows = not tested)   LOWER EXTREMITY MMT:   MMT Right eval Left eval Rt 02/19/22 Lt 02/1922  Hip flexion 4+ 4 4+ 4  Hip extension '3 3 4 4  ' Hip abduction 3 3 4+ 4+  Hip adduction        Hip internal rotation        Hip external rotation     4+ 4+  Knee flexion 5 4+ 5 5  Knee extension 5 4+ 5 5  Ankle dorsiflexion        Ankle plantarflexion        Ankle inversion        Ankle eversion         (Blank rows = not tested)       FUNCTIONAL TESTS:  5 times sit to stand: 18.2 sec used hands from blue office chair; 5xSTS= 18.9". 02/24/22= 15.4" 30 seconds chair stand test Timed up and go (TUG): NT on eval  2 minute walk test: NT on eval    GAIT: Distance walked: 150 Assistive device utilized: Quad cane small base Level of assistance: Modified independence Comments: antalgic gait, slow velocity , wide BOS    TODAY'S TREATMENT: [facility] Adult PT Treatment:                                                DATE: 02/25/22 Therapeutic Exercise: NuStep L5 6 mins LE Hook lying hip clams 2x10 GTB 5" SL hip abd 2x10 3# Standing hip ext 2x10 5# Marching on airex Shoulder row GTB 2x10 Shoulder ext GTB 2x10  [facility] Adult PT Treatment:                                                DATE: 02/24/22 Therapeutic Exercise: NuStep L5 6 mins LE Knee ext omega 3x10 10# Knee flex omega 3x10 15# Standing hip abd 2x10 5# Standing hip ext 2x10 5# Single leg stands x3 30" use of hands as needed Therapeutic Activity: STS 2x5 (5xSTS) Walking c a quad cane vs SPC.    [facility] Adult PT  Treatment:                                                DATE: 02/19/22 Therapeutic Exercise: NuStep L4 5 mins LE Seated Long Arc Quad  2 sets - 10 reps - 3 hold 5 STS x10 10 Standing hip abd  2x10 5# Standing hip aext 2x10 5# Standing knee flex 2x10 3# Standing ankle DF/PF 2x10 Single leg stands x3 30" use of hands as needed  PATIENT EDUCATION:  Education details: HEP, POC  Person educated: Patient Education method: Explanation and Handouts Education comprehension: verbalized understanding and needs further education     HOME EXERCISE PROGRAM: Access Code: [email or web address] URL: [email or web address] Date: 02/12/2022 Prepared by: Gar Ponto  Exercises - Supine Bridge  - 1 x daily - 7 x weekly - 2 sets - 10 reps - 5 hold - Supine Lower Trunk Rotation  - 1 x daily - 7 x weekly - 2 sets - 10 reps - 10 hold - Supine Active Straight Leg Raise  - 1 x daily - 7 x weekly - 2 sets - 10 reps - Seated Long Arc Quad  - 1 x daily - 7 x weekly - 2 sets - 10 reps - 5 hold - Sidelying Hip Abduction  - 1 x daily - 7 x weekly - 2 sets - 10 reps - 5 hold - Hooklying Clamshell with Resistance  - 1 x daily - 7 x weekly - 3 sets - 10 reps - 3 hold - Standing Single Leg Stance with Counter Support  - 1 x daily - 7 x weekly - 1 sets - 5 reps - 20 hold - Standing Tandem Balance with Counter Support  - 1 x daily - 7 x weekly - 1 sets - 5 reps - 20 hold   ASSESSMENT:   CLINICAL IMPRESSION: With pt reporting an increase in thigh pain following yesterday;'s PT session, PT was completed to address hip and trunk strength and balance. Pt tolerated PT today without adverse effects. Over the course of PT, pt has made appropriate progres re: strength, balance, and gait quality. See LTGs and 5xSTS time from yesterday's session. Pt will continue to benefit from skilled PT to address impairments for improved function and safety.  OBJECTIVE IMPAIRMENTS Abnormal gait, decreased activity tolerance, decreased balance, decreased cognition, decreased endurance, decreased knowledge of use of DME, decreased mobility, difficulty walking, decreased ROM, decreased strength, increased edema, increased fascial  restrictions, impaired flexibility, postural dysfunction, and pain.    ACTIVITY LIMITATIONS carrying, lifting, bending, sitting, standing, squatting, sleeping, stairs, transfers, bed mobility, locomotion level, and caring for others   PARTICIPATION LIMITATIONS: meal prep, cleaning, laundry, shopping, community activity, yard work, and     PERSONAL FACTORS Behavior pattern, Past/current experiences, Time since onset of injury/illness/exacerbation, and 3+ comorbidities: OA, diabetes, stress  are also affecting patient's functional outcome.     GOALS: LONG TERM GOALS: Target date: 03/04/2022    Pt will be able to stand without UEs x 5 in < 20 sec to demo improved functional strength  Baseline:  Status: 02/05/22=Standing feet a part and together 90" each Goal status: Met   2.  Pt will be able to stand, walk in the community for 20 min without undue fatigue Baseline:  Status: 02/19/22: Pt reports being able to walk in a grocery store c a cart for  20 mins Goal status: Improving   3.  Pt will be able to show I with HEP for hip and knee mobility, strength  Baseline:  Status: pt demonstrates proper completion of HEP and reports completing daily Goal status: Ongoing   4.  Pt will be able to increase hip strength to at least 4/5 to optimize gait stability  Baseline:  Status: 02/19/22=See flow sheets Goal status: MET   5.  Pt will ambulate with cane and improved stride length, trunk and hip extension when cued.  Baseline:  Status: 02/24/22 Improved stride length bilat, pass the opposite foot. Goal status: MET    PLAN: PT FREQUENCY: 2x/week   PT DURATION: 8 weeks   PLANNED INTERVENTIONS: Therapeutic exercises, Therapeutic activity, Neuromuscular re-education, Balance training, Gait training, Patient/Family education, Self Care, Joint mobilization, DME instructions, Aquatic Therapy, Cryotherapy, Moist heat, Taping, Manual therapy, and Re-evaluation   PLAN FOR NEXT SESSION: HEP, Nustep,  hip strength, ant hip stretching. Consider seated exs if pt continues to have an issue with lying supine and turning to her sides.   Tashonda Pinkus MS, PT 02/25/22 3:11 PM

## 2022-02-24 NOTE — Therapy (Signed)
OUTPATIENT PHYSICAL THERAPY TREATMENT NOTE   Patient Name: Sharon Knight MRN: [id number] DOB:02/14/1946, 76 y.o., female Today's Date: 02/24/2022  PCP: Billey Gosling, MD  REFERRING PROVIDER: Dr. [name]  END OF SESSION:   PT End of Session - 02/24/22 1340     Visit Number 9    Number of Visits 16    Date for PT Re-Evaluation 03/18/22    Authorization Type [employer]    PT Start Time 1331    PT Stop Time 1415    PT Time Calculation (min) 44 min    Equipment Utilized During Treatment Other (comment)   quad cane   Activity Tolerance Patient tolerated treatment well    Behavior During Therapy [facility] for tasks assessed/performed                    Past Medical History:  Diagnosis Date   Arthritis    Bilateral cataracts    Dr Kathrin Penner   Exertional dyspnea 12/19/2020   Hyperlipidemia    Hypertension    Past Surgical History:  Procedure Laterality Date   COLONOSCOPY     Dr Fuller Plan   NEPHRECTOMY     right; for cyst on kidney   OVARIAN CYST REMOVAL     Patient Active Problem List   Diagnosis Date Noted   Vertigo 10/23/2021   Exertional dyspnea 12/19/2020   Aortic atherosclerosis ([place]) 10/16/2020   Acute pain of left knee 09/16/2020   Trigger finger, right ring finger 03/17/2019   CKD stage 3, 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 03/16/2019   Diabetes mellitus without complication ([place]) 50/07/7046   Bilateral low back pain 03/17/2018   Gout 12/21/2017   Lobar pneumonia ([place]) 12/10/2017   Paroxysmal atrial fibrillation ([place]) 12/10/2017   History of colonic polyps 10/22/2014   History of nephrectomy, unilateral 11/13/2012   Vitamin D deficiency 10/31/2012   ECZEMA 03/03/2010   Benign neoplasm of colon 08/02/2007   Hyperlipidemia 08/02/2007   Essential hypertension 08/02/2007   Osteopenia 08/02/2007    REFERRING DIAG: M25.562,G89.29 (ICD-10-CM) - Chronic pain of left knee   THERAPY DIAG:  Chronic pain of left knee  Stiffness of left knee, not elsewhere  classified  Stiffness of right knee, not elsewhere classified  Muscle weakness (generalized)  Gait instability  Chronic pain of right knee  Rationale for Evaluation and Treatment Rehabilitation   SUBJECTIVE:    SUBJECTIVE STATEMENT: Pt reports having an appt with Dr. Georgina Snell today and he was please with her progress.  PAIN:  Are you having pain? Yes: NPRS scale: 0/10 Pain location: L knee  Pain description: sore  Aggravating factors: standing , walking  Relieving factors: injection,   PERTINENT HISTORY:M25.562,G89.29 (ICD-10-CM) - Chronic pain of left knee    Gout, HTN, Diabetes, chronic knee pain, multi-factorial stress, Vertigo occ. , unsteady gait    PRECAUTIONS: None   WEIGHT BEARING RESTRICTIONS No   PATIENT GOALS Pt would like to have some more stamina.  I want to feel more confident with walking.      OBJECTIVE: (objective measures completed at initial evaluation unless otherwise dated)   DIAGNOSTIC FINDINGS:    MSK: Left knee mild effusion decreased range of motion pain with flexion and extension.  XR : 1. Moderate-to-severe tricompartmental degenerative changes, most pronounced in the medial and patellofemoral compartments. 2. Trace suprapatellar joint effusion. 3. Chondrocalcinosis.   PATIENT SURVEYS:  FOTO 48%; 02/12/22= 47%   COGNITION:  Overall cognitive status: Within functional limits for tasks assessed                          SENSATION: WFL   EDEMA:  Circumferential: 16 3/8 inch each knee    MUSCLE LENGTH: Hamstrings: tight Thomas test: tight    POSTURE: rounded shoulders, forward head, increased thoracic kyphosis, and flexed trunk  Rt knee valgus    PALPATION: TTP medial L knee    LOWER EXTREMITY ROM:   Active ROM Right eval Left eval  Hip flexion      Hip extension 3 3  Hip abduction 3 3  Hip adduction      Hip internal rotation      Hip external rotation      Knee flexion 121 120  Knee extension 20 19  Ankle  dorsiflexion      Ankle plantarflexion      Ankle inversion      Ankle eversion       (Blank rows = not tested)   LOWER EXTREMITY MMT:   MMT Right eval Left eval Rt 02/19/22 Lt 02/1922  Hip flexion 4+ 4 4+ 4  Hip extension _0 Hip abduction 3 3 4+ 4+  Hip adduction        Hip internal rotation        Hip external rotation     4+ 4+  Knee flexion 5 4+ 5 5  Knee extension 5 4+ 5 5  Ankle dorsiflexion        Ankle plantarflexion        Ankle inversion        Ankle eversion         (Blank rows = not tested)       FUNCTIONAL TESTS:  5 times sit to stand: 18.2 sec used hands from blue office chair; 5xSTS= 18.9". 02/24/22= 15.4" 30 seconds chair stand test Timed up and go (TUG): NT on eval  2 minute walk test: NT on eval    GAIT: Distance walked: 150 Assistive device utilized: Quad cane small base Level of assistance: Modified independence Comments: antalgic gait, slow velocity , wide BOS    TODAY'S TREATMENT: [facility] Adult PT Treatment:                                                DATE: 02/24/22 Therapeutic Exercise: NuStep L5 6 mins LE Knee ext omega 3x10 10# Knee flex omega 3x10 15# Standing hip abd 2x10 5# Standing hip ext 2x10 5# Single leg stands x3 30" use of hands as needed Therapeutic Activity: STS 2x5 (5xSTS) Walking c a quad cane vs SPC.    [facility] Adult PT Treatment:                                                DATE: 02/19/22 Therapeutic Exercise: NuStep L4 5 mins LE Seated Long Arc Quad  2 sets - 10 reps - 3 hold 5 STS x10 10 Standing hip abd 2x10 5# Standing hip aext 2x10 5# Standing knee flex 2x10 3# Standing ankle DF/PF 2x10 Single leg stands x3 30" use of hands as needed  [facility] Adult PT Treatment:  DATE: 02/17/22 Therapeutic Exercise: NuStep L4 5 mins LE Single leg stands x3 30" use of hands as needed Tandem stands x3 20" each foot forward, use of hands as needed Standing ankle DF/PF 2x10   Seated Long Arc Quad  2 sets - 10 reps - 3 hold 5# STS x10 s hands  Side lying hip abd 2 sets - 8 reps - 3 hold 5# Supine hip SLR 2x10 3# Standing hip abd 2x10 3#  PATIENT EDUCATION:  Education details: HEP, POC  Person educated: Patient Education method: Explanation and Handouts Education comprehension: verbalized understanding and needs further education     HOME EXERCISE PROGRAM: Access Code: [email or web address] URL: [email or web address] Date: 02/12/2022 Prepared by: Gar Ponto  Exercises - Supine Bridge  - 1 x daily - 7 x weekly - 2 sets - 10 reps - 5 hold - Supine Lower Trunk Rotation  - 1 x daily - 7 x weekly - 2 sets - 10 reps - 10 hold - Supine Active Straight Leg Raise  - 1 x daily - 7 x weekly - 2 sets - 10 reps - Seated Long Arc Quad  - 1 x daily - 7 x weekly - 2 sets - 10 reps - 5 hold - Sidelying Hip Abduction  - 1 x daily - 7 x weekly - 2 sets - 10 reps - 5 hold - Hooklying Clamshell with Resistance  - 1 x daily - 7 x weekly - 3 sets - 10 reps - 3 hold - Standing Single Leg Stance with Counter Support  - 1 x daily - 7 x weekly - 1 sets - 5 reps - 20 hold - Standing Tandem Balance with Counter Support  - 1 x daily - 7 x weekly - 1 sets - 5 reps - 20 hold   ASSESSMENT:   CLINICAL IMPRESSION: Pt completed 5xSTS s use of hands and it was found improved in comparison to eval. Pt's gait was reassessed and it was found improved with pt stepping pass the opposite foot bilat. With ambulation pt used a quad cane, but does so with light contact to the ground. Attempted gait with a SPC. Pt did fine with the SPC, but stated she preferred the quad cane for use with steps and on uneven ground and gravel. Pt has met LTG #5, and a decrease STS time indicates improved function. Pt was continued for LE/trunk strengthening and balance. Pt tolerated PT today without adverse effects.  OBJECTIVE IMPAIRMENTS Abnormal gait, decreased activity tolerance, decreased balance, decreased  cognition, decreased endurance, decreased knowledge of use of DME, decreased mobility, difficulty walking, decreased ROM, decreased strength, increased edema, increased fascial restrictions, impaired flexibility, postural dysfunction, and pain.    ACTIVITY LIMITATIONS carrying, lifting, bending, sitting, standing, squatting, sleeping, stairs, transfers, bed mobility, locomotion level, and caring for others   PARTICIPATION LIMITATIONS: meal prep, cleaning, laundry, shopping, community activity, yard work, and     PERSONAL FACTORS Behavior pattern, Past/current experiences, Time since onset of injury/illness/exacerbation, and 3+ comorbidities: OA, diabetes, stress  are also affecting patient's functional outcome.     GOALS: LONG TERM GOALS: Target date: 03/04/2022    Pt will be able to stand without UEs x 5 in < 20 sec to demo improved functional strength  Baseline:  Status: 02/05/22=Standing feet a part and together 90" each Goal status: Met   2.  Pt will be able to stand, walk in the community for 20 min without undue fatigue Baseline:  Status: 02/19/22: Pt reports being able  to walk in a grocery store c a cart for 20 mins Goal status: Improving   3.  Pt will be able to show I with HEP for hip and knee mobility, strength  Baseline:  Status: pt demonstrates proper completion of HEP and reports completing daily Goal status: Ongoing   4.  Pt will be able to increase hip strength to at least 4/5 to optimize gait stability  Baseline:  Status: 02/19/22=See flow sheets Goal status: MET   5.  Pt will ambulate with cane and improved stride length, trunk and hip extension when cued.  Baseline:  Status: Improved stride length bilat, pass the opposite foot. Goal status: MET    PLAN: PT FREQUENCY: 2x/week   PT DURATION: 8 weeks   PLANNED INTERVENTIONS: Therapeutic exercises, Therapeutic activity, Neuromuscular re-education, Balance training, Gait training, Patient/Family education, Self  Care, Joint mobilization, DME instructions, Aquatic Therapy, Cryotherapy, Moist heat, Taping, Manual therapy, and Re-evaluation   PLAN FOR NEXT SESSION: HEP, Nustep, hip strength, ant hip stretching. Consider seated exs if pt continues to have an issue with lying supine and turning to her sides.   Allen Ralls MS, PT 02/24/22 4:44 PM

## 2022-02-25 ENCOUNTER — Ambulatory Visit: Payer: Medicare Other

## 2022-02-25 DIAGNOSIS — M25662 Stiffness of left knee, not elsewhere classified: Secondary | ICD-10-CM

## 2022-02-25 DIAGNOSIS — M25562 Pain in left knee: Secondary | ICD-10-CM | POA: Diagnosis not present

## 2022-02-25 DIAGNOSIS — M6281 Muscle weakness (generalized): Secondary | ICD-10-CM | POA: Diagnosis not present

## 2022-02-25 DIAGNOSIS — M25661 Stiffness of right knee, not elsewhere classified: Secondary | ICD-10-CM

## 2022-02-25 DIAGNOSIS — M25561 Pain in right knee: Secondary | ICD-10-CM | POA: Diagnosis not present

## 2022-02-25 DIAGNOSIS — R2681 Unsteadiness on feet: Secondary | ICD-10-CM

## 2022-02-25 DIAGNOSIS — G8929 Other chronic pain: Secondary | ICD-10-CM | POA: Diagnosis not present

## 2022-02-27 ENCOUNTER — Telehealth: Payer: Medicare Other

## 2022-02-27 ENCOUNTER — Other Ambulatory Visit: Payer: Self-pay | Admitting: Cardiovascular Disease

## 2022-02-27 NOTE — Telephone Encounter (Signed)
Rx(s) sent to pharmacy electronically.  

## 2022-03-03 NOTE — Therapy (Unsigned)
OUTPATIENT PHYSICAL THERAPY TREATMENT NOTE/Progress Note   Patient Name: Sharon Knight MRN: [id number] DOB:01/17/1946, 76 y.o., female Today's Date: 03/03/2022  PCP: Billey Gosling, MD  REFERRING PROVIDER: Dr. [name]  Progress Note Reporting Period 01/2022 to 02/25/22  See note below for Objective Data and Assessment of Progress/Goals.      END OF SESSION:             Past Medical History:  Diagnosis Date   Arthritis    Bilateral cataracts    Dr Kathrin Penner   Exertional dyspnea 12/19/2020   Hyperlipidemia    Hypertension    Past Surgical History:  Procedure Laterality Date   COLONOSCOPY     Dr Fuller Plan   NEPHRECTOMY     right; for cyst on kidney   OVARIAN CYST REMOVAL     Patient Active Problem List   Diagnosis Date Noted   Vertigo 10/23/2021   Exertional dyspnea 12/19/2020   Aortic atherosclerosis ([place]) 10/16/2020   Acute pain of left knee 09/16/2020   Trigger finger, right ring finger 03/17/2019   CKD stage 3, 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  Rationale for Evaluation and Treatment Rehabilitation   SUBJECTIVE:    SUBJECTIVE STATEMENT: Pt reports she is having thigh soreness today after yesterdays session PAIN:  Are you having pain? Yes: NPRS scale: 0/10 Pain location: L knee  Pain description: sore  Aggravating factors: standing , walking  Relieving  factors: injection,   PERTINENT HISTORY:M25.562,G89.29 (ICD-10-CM) - Chronic pain of left knee    Gout, HTN, Diabetes, chronic knee pain, multi-factorial stress, Vertigo occ. , unsteady gait    PRECAUTIONS: None   WEIGHT BEARING RESTRICTIONS No   PATIENT GOALS Pt would like to have some more stamina.  I want to feel more confident with walking.      OBJECTIVE: (objective measures completed at initial evaluation unless otherwise dated)   DIAGNOSTIC FINDINGS:    MSK: Left knee mild effusion decreased range of motion pain with flexion and extension.  XR : 1. Moderate-to-severe tricompartmental degenerative changes, most pronounced in the medial and patellofemoral compartments. 2. Trace suprapatellar joint effusion. 3. Chondrocalcinosis.   PATIENT SURVEYS:  FOTO 48%; 02/12/22= 47%   COGNITION:           Overall cognitive status: Within functional limits for tasks assessed                          SENSATION: WFL   EDEMA:  Circumferential: 16 3/8 inch each knee    MUSCLE LENGTH: Hamstrings: tight Thomas test: tight    POSTURE: rounded shoulders, forward head, increased thoracic kyphosis, and flexed trunk  Rt knee valgus    PALPATION: TTP medial L knee    LOWER EXTREMITY ROM:   Active ROM Right eval Left eval  Hip flexion      Hip extension 3 3  Hip abduction  3 3  Hip adduction      Hip internal rotation      Hip external rotation      Knee flexion 121 120  Knee extension 20 19  Ankle dorsiflexion      Ankle plantarflexion      Ankle inversion      Ankle eversion       (Blank rows = not tested)   LOWER EXTREMITY MMT:   MMT Right eval Left eval Rt 02/19/22 Lt 02/1922  Hip flexion 4+ 4 4+ 4  Hip extension _0 Hip abduction 3 3 4+ 4+  Hip adduction        Hip internal rotation        Hip external rotation     4+ 4+  Knee flexion 5 4+ 5 5  Knee extension 5 4+ 5 5  Ankle dorsiflexion        Ankle plantarflexion        Ankle inversion         Ankle eversion         (Blank rows = not tested)       FUNCTIONAL TESTS:  5 times sit to stand: 18.2 sec used hands from blue office chair; 5xSTS= 18.9". 02/24/22= 15.4" 30 seconds chair stand test Timed up and go (TUG): NT on eval  2 minute walk test: NT on eval    GAIT: Distance walked: 150 Assistive device utilized: Quad cane small base Level of assistance: Modified independence Comments: antalgic gait, slow velocity , wide BOS    TODAY'S TREATMENT:   [facility] Adult PT Treatment:                                                DATE: 03/04/22 Therapeutic Exercise: *** Manual Therapy: *** Neuromuscular re-ed: *** Therapeutic Activity: *** Modalities: *** Self Care: ***  [facility] Adult PT Treatment:                                                DATE: 02/25/22 Therapeutic Exercise: NuStep L5 6 mins LE Hook lying hip clams 2x10 GTB 5" SL hip abd 2x10 3# Standing hip ext 2x10 5# Marching on airex Shoulder row GTB 2x10 Shoulder ext GTB 2x10  [facility] Adult PT Treatment:                                                DATE: 02/24/22 Therapeutic Exercise: NuStep L5 6 mins LE Knee ext omega 3x10 10# Knee flex omega 3x10 15# Standing hip abd 2x10 5# Standing hip ext 2x10 5# Single leg stands x3 30" use of hands as needed Therapeutic Activity: STS 2x5 (5xSTS) Walking c a quad cane vs SPC.    [facility] Adult PT Treatment:                                                DATE: 02/19/22 Therapeutic Exercise: NuStep L4 5 mins LE  Seated Long Arc Quad  2 sets - 10 reps - 3 hold 5 STS x10 10 Standing hip abd 2x10 5# Standing hip aext 2x10 5# Standing knee flex 2x10 3# Standing ankle DF/PF 2x10 Single leg stands x3 30" use of hands as needed  PATIENT EDUCATION:  Education details: HEP, POC  Person educated: Patient Education method: Explanation and Handouts Education comprehension: verbalized understanding and needs further education     HOME EXERCISE PROGRAM: Access Code:  [email or web address] URL: [email or web address] Date: 02/12/2022 Prepared by: Gar Ponto  Exercises - Supine Bridge  - 1 x daily - 7 x weekly - 2 sets - 10 reps - 5 hold - Supine Lower Trunk Rotation  - 1 x daily - 7 x weekly - 2 sets - 10 reps - 10 hold - Supine Active Straight Leg Raise  - 1 x daily - 7 x weekly - 2 sets - 10 reps - Seated Long Arc Quad  - 1 x daily - 7 x weekly - 2 sets - 10 reps - 5 hold - Sidelying Hip Abduction  - 1 x daily - 7 x weekly - 2 sets - 10 reps - 5 hold - Hooklying Clamshell with Resistance  - 1 x daily - 7 x weekly - 3 sets - 10 reps - 3 hold - Standing Single Leg Stance with Counter Support  - 1 x daily - 7 x weekly - 1 sets - 5 reps - 20 hold - Standing Tandem Balance with Counter Support  - 1 x daily - 7 x weekly - 1 sets - 5 reps - 20 hold   ASSESSMENT:   CLINICAL IMPRESSION: With pt reporting an increase in thigh pain following yesterday;'s PT session, PT was completed to address hip and trunk strength and balance. Pt tolerated PT today without adverse effects. Over the course of PT, pt has made appropriate progres re: strength, balance, and gait quality. See LTGs and 5xSTS time from yesterday's session. Pt will continue to benefit from skilled PT to address impairments for improved function and safety.  OBJECTIVE IMPAIRMENTS Abnormal gait, decreased activity tolerance, decreased balance, decreased cognition, decreased endurance, decreased knowledge of use of DME, decreased mobility, difficulty walking, decreased ROM, decreased strength, increased edema, increased fascial restrictions, impaired flexibility, postural dysfunction, and pain.    ACTIVITY LIMITATIONS carrying, lifting, bending, sitting, standing, squatting, sleeping, stairs, transfers, bed mobility, locomotion level, and caring for others   PARTICIPATION LIMITATIONS: meal prep, cleaning, laundry, shopping, community activity, yard work, and     PERSONAL FACTORS Behavior pattern,  Past/current experiences, Time since onset of injury/illness/exacerbation, and 3+ comorbidities: OA, diabetes, stress  are also affecting patient's functional outcome.     GOALS: LONG TERM GOALS: Target date: 03/04/2022    Pt will be able to stand without UEs x 5 in < 20 sec to demo improved functional strength  Baseline:  Status: 02/05/22=Standing feet a part and together 90" each Goal status: Met   2.  Pt will be able to stand, walk in the community for 20 min without undue fatigue Baseline:  Status: 02/19/22: Pt reports being able to walk in a grocery store c a cart for 20 mins Goal status: Improving   3.  Pt will be able to show I with HEP for hip and knee mobility, strength  Baseline:  Status: pt demonstrates proper completion of HEP and reports completing daily Goal status: Ongoing   4.  Pt will be able to increase hip strength to at least 4/5  to optimize gait stability  Baseline:  Status: 02/19/22=See flow sheets Goal status: MET   5.  Pt will ambulate with cane and improved stride length, trunk and hip extension when cued.  Baseline:  Status: 02/24/22 Improved stride length bilat, pass the opposite foot. Goal status: MET    PLAN: PT FREQUENCY: 2x/week   PT DURATION: 8 weeks   PLANNED INTERVENTIONS: Therapeutic exercises, Therapeutic activity, Neuromuscular re-education, Balance training, Gait training, Patient/Family education, Self Care, Joint mobilization, DME instructions, Aquatic Therapy, Cryotherapy, Moist heat, Taping, Manual therapy, and Re-evaluation   PLAN FOR NEXT SESSION: HEP, Nustep, hip strength, ant hip stretching. Consider seated exs if pt continues to have an issue with lying supine and turning to her sides.   Allen Ralls MS, PT 03/03/22 9:20 AM

## 2022-03-04 ENCOUNTER — Ambulatory Visit: Payer: Medicare Other | Attending: Family Medicine | Admitting: Physical Therapy

## 2022-03-04 ENCOUNTER — Encounter: Payer: Self-pay | Admitting: Physical Therapy

## 2022-03-04 DIAGNOSIS — M25562 Pain in left knee: Secondary | ICD-10-CM | POA: Insufficient documentation

## 2022-03-04 DIAGNOSIS — G8929 Other chronic pain: Secondary | ICD-10-CM | POA: Insufficient documentation

## 2022-03-04 DIAGNOSIS — R2681 Unsteadiness on feet: Secondary | ICD-10-CM | POA: Diagnosis not present

## 2022-03-04 DIAGNOSIS — M25662 Stiffness of left knee, not elsewhere classified: Secondary | ICD-10-CM | POA: Insufficient documentation

## 2022-03-04 DIAGNOSIS — M6281 Muscle weakness (generalized): Secondary | ICD-10-CM | POA: Diagnosis not present

## 2022-03-04 DIAGNOSIS — M25661 Stiffness of right knee, not elsewhere classified: Secondary | ICD-10-CM | POA: Insufficient documentation

## 2022-03-04 DIAGNOSIS — M25561 Pain in right knee: Secondary | ICD-10-CM | POA: Diagnosis not present

## 2022-03-05 NOTE — Therapy (Signed)
OUTPATIENT PHYSICAL THERAPY TREATMENT NOTE  Patient Name: Sharon Knight MRN: [id number] DOB:07-25-1945, 76 y.o., female Today's Date: 03/06/2022  PCP: Billey Gosling, MD  REFERRING PROVIDER: Dr. [name]    END OF SESSION:   PT End of Session - 03/06/22 1411     Visit Number 12    Number of Visits 16    Date for PT Re-Evaluation 03/18/22    Authorization Type BCBS    PT Start Time 1406    PT Stop Time 1448    PT Time Calculation (min) 42 min    Equipment Utilized During Treatment Other (comment)   quad cane   Activity Tolerance Patient tolerated treatment well    Behavior During Therapy [facility] for tasks assessed/performed                       Past Medical History:  Diagnosis Date   Arthritis    Bilateral cataracts    Dr Kathrin Penner   Exertional dyspnea 12/19/2020   Hyperlipidemia    Hypertension    Past Surgical History:  Procedure Laterality Date   COLONOSCOPY     Dr Fuller Plan   NEPHRECTOMY     right; for cyst on kidney   OVARIAN CYST REMOVAL     Patient Active Problem List   Diagnosis Date Noted   Vertigo 10/23/2021   Exertional dyspnea 12/19/2020   Aortic atherosclerosis ([place]) 10/16/2020   Acute pain of left knee 09/16/2020   Trigger finger, right ring finger 03/17/2019   CKD stage 3, GFR 30-59 ml/min ([place]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 03/16/2019   Diabetes mellitus without complication ([place]) 03/00/9233   Bilateral low back pain 03/17/2018   Gout 12/21/2017   Lobar pneumonia ([place]) 12/10/2017   Paroxysmal atrial fibrillation ([place]) 12/10/2017   History of colonic polyps 10/22/2014   History of nephrectomy, unilateral 11/13/2012   Vitamin D deficiency 10/31/2012   ECZEMA 03/03/2010   Benign neoplasm of colon 08/02/2007   Hyperlipidemia 08/02/2007   Essential hypertension 08/02/2007   Osteopenia 08/02/2007    REFERRING DIAG: M25.562,G89.29 (ICD-10-CM) - Chronic pain of left knee   THERAPY DIAG:  Chronic pain of left knee  Stiffness of left knee, not  elsewhere classified  Stiffness of right knee, not elsewhere classified  Muscle weakness (generalized)  Gait instability  Rationale for Evaluation and Treatment Rehabilitation   SUBJECTIVE:    SUBJECTIVE STATEMENT: Pt reports she is continuing to do better with her mobility.  PAIN:  Are you having pain? Yes: NPRS scale: 0/10 Pain location: L knee  Pain description: sore  Aggravating factors: standing , walking  Relieving factors: injection,   PERTINENT HISTORY:M25.562,G89.29 (ICD-10-CM) - Chronic pain of left knee    Gout, HTN, Diabetes, chronic knee pain, multi-factorial stress, Vertigo occ. , unsteady gait    PRECAUTIONS: None   WEIGHT BEARING RESTRICTIONS No   PATIENT GOALS Pt would like to have some more stamina.  I want to feel more confident with walking.      OBJECTIVE: (objective measures completed at initial evaluation unless otherwise dated)   DIAGNOSTIC FINDINGS:    MSK: Left knee mild effusion decreased range of motion pain with flexion and extension.  XR : 1. Moderate-to-severe tricompartmental degenerative changes, most pronounced in the medial and patellofemoral compartments. 2. Trace suprapatellar joint effusion. 3. Chondrocalcinosis.   PATIENT SURVEYS:  FOTO 48%;  02/12/22= 47% 03/06/22=58%   COGNITION:           Overall cognitive status: Within functional  limits for tasks assessed                          SENSATION: WFL   EDEMA:  Circumferential: 16 3/8 inch each knee    MUSCLE LENGTH: Hamstrings: tight Thomas test: tight    POSTURE: rounded shoulders, forward head, increased thoracic kyphosis, and flexed trunk  Rt knee valgus    PALPATION: TTP medial L knee    LOWER EXTREMITY ROM:   Active ROM Right eval Left eval Rt./Lt.  03/04/22  Hip flexion       Hip extension     Hip abduction     Hip adduction       Hip internal rotation       Hip external rotation       Knee flexion 121 120   Knee extension 20 19   Ankle  dorsiflexion       Ankle plantarflexion       Ankle inversion       Ankle eversion        (Blank rows = not tested)   LOWER EXTREMITY MMT:   MMT Right eval Left eval Rt 02/19/22 Lt 02/1922  Hip flexion 4+ 4 4+ 4  Hip extension _0 Hip abduction 3 3 4+ 4+  Hip adduction        Hip internal rotation        Hip external rotation     4+ 4+  Knee flexion 5 4+ 5 5  Knee extension 5 4+ 5 5  Ankle dorsiflexion        Ankle plantarflexion        Ankle inversion        Ankle eversion         (Blank rows = not tested)       FUNCTIONAL TESTS:  5 times sit to stand: 18.2 sec used hands from blue office chair; 5xSTS= 18.9". 02/24/22= 15.4" 30 seconds chair stand test Timed up and go (TUG): NT on eval  2 minute walk test: NT on eval    GAIT: Distance walked: 150 Assistive device utilized: Quad cane small base Level of assistance: Modified independence Comments: antalgic gait, slow velocity , wide BOS    TODAY'S TREATMENT: [facility] Adult PT Treatment:                                                DATE: 03/06/22 Therapeutic Exercise: NuStep L6 UE and LE for 8 min  LAQ 3x10 5# Standing skateboard 30 sec x 4, 20" each DF/PF x 15 Hip abduction red looped band 2x15 each side  Hip extension red loop 2x15 each side  Wide squat x 10  LAQ 2x10 5#  Therapeutic Activity:  Completion of FOTO and review of results  [facility] Adult PT Treatment:                                                DATE: 03/04/22 Therapeutic Exercise: NuStep L5 UE and LE for 8 min  Standing skateboard 30 sec x 3 Heel raise x 15 Ankle DF alternating x 10  Alternating high march x 10  Hip abduction red looped band  x 15 each side  Hip extension red loop x 15 each side  Wide squat x 10  Standing hip flexor each side , fatigued Supine ant hip stretch off table with opposite knee to chest using sheet  Hamstring and ITB stretch 30 sec each x 2  Bridge x 15 Sidelying hip ext/abd x 10 , 2 sets      [facility] Adult PT  Treatment:                                                DATE: 02/25/22 Therapeutic Exercise: NuStep L5 6 mins LE Hook lying hip clams 2x10 GTB 5" SL hip abd 2x10 3# Standing hip ext 2x10 5# Marching on airex Shoulder row GTB 2x10 Shoulder ext GTB 2x10  PATIENT EDUCATION:  Education details: HEP, POC  Person educated: Patient Education method: Explanation and Handouts Education comprehension: verbalized understanding and needs further education     HOME EXERCISE PROGRAM: Access Code: [email or web address] URL: [email or web address] Date: 02/12/2022 Prepared by: Gar Ponto ASSESSMENT:   CLINICAL IMPRESSION: PT was completed for LE strengthening in both the open and kinetic chains and for balance. Pt's FOTO was reassessed and found improved indicating a better functioning level. Pt tolerated PT today without adverse effects. Pt will continue to benefit from skilled PT to address impairments for improved function and safety  OBJECTIVE IMPAIRMENTS Abnormal gait, decreased activity tolerance, decreased balance, decreased cognition, decreased endurance, decreased knowledge of use of DME, decreased mobility, difficulty walking, decreased ROM, decreased strength, increased edema, increased fascial restrictions, impaired flexibility, postural dysfunction, and pain.    ACTIVITY LIMITATIONS carrying, lifting, bending, sitting, standing, squatting, sleeping, stairs, transfers, bed mobility, locomotion level, and caring for others   PARTICIPATION LIMITATIONS: meal prep, cleaning, laundry, shopping, community activity, yard work, and     PERSONAL FACTORS Behavior pattern, Past/current experiences, Time since onset of injury/illness/exacerbation, and 3+ comorbidities: OA, diabetes, stress  are also affecting patient's functional outcome.     GOALS: LONG TERM GOALS: Target date: 03/04/2022    Pt will be able to stand without UEs x 5 in < 20 sec to demo improved functional strength   Baseline:  Status: 02/05/22=Standing feet a part and together 90" each Goal status: Met   2.  Pt will be able to stand, walk in the community for 20 min without undue fatigue Baseline:  Status: 02/19/22: Pt reports being able to walk in a grocery store c a cart for 20 mins Goal status: Improving   3.  Pt will be able to show I with HEP for hip and knee mobility, strength  Baseline:  Status: pt demonstrates proper completion of HEP and reports completing daily Goal status: Ongoing   4.  Pt will be able to increase hip strength to at least 4/5 to optimize gait stability  Baseline:  Status: 02/19/22=See flow sheets Goal status: MET   5.  Pt will ambulate with cane and improved stride length, trunk and hip extension when cued.  Baseline:  Status: 02/24/22 Improved stride length bilat, pass the opposite foot. Goal status: MET    PLAN: PT FREQUENCY: 2x/week   PT DURATION: 8 weeks   PLANNED INTERVENTIONS: Therapeutic exercises, Therapeutic activity, Neuromuscular re-education, Balance training, Gait training, Patient/Family education, Self Care, Joint mobilization, DME instructions, Aquatic Therapy, Cryotherapy, Moist heat, Taping, Manual therapy, and Re-evaluation   PLAN FOR NEXT  SESSION: HEP, Nustep, hip strength, ant hip stretching. Consider dizziness with supine and rolling    [employer] MS, PT 03/06/22 3:21 PM

## 2022-03-06 ENCOUNTER — Ambulatory Visit: Payer: Medicare Other

## 2022-03-06 DIAGNOSIS — R2681 Unsteadiness on feet: Secondary | ICD-10-CM

## 2022-03-06 DIAGNOSIS — M25661 Stiffness of right knee, not elsewhere classified: Secondary | ICD-10-CM | POA: Diagnosis not present

## 2022-03-06 DIAGNOSIS — M25662 Stiffness of left knee, not elsewhere classified: Secondary | ICD-10-CM | POA: Diagnosis not present

## 2022-03-06 DIAGNOSIS — G8929 Other chronic pain: Secondary | ICD-10-CM | POA: Diagnosis not present

## 2022-03-06 DIAGNOSIS — M6281 Muscle weakness (generalized): Secondary | ICD-10-CM | POA: Diagnosis not present

## 2022-03-06 DIAGNOSIS — M25562 Pain in left knee: Secondary | ICD-10-CM | POA: Diagnosis not present

## 2022-03-06 DIAGNOSIS — M25561 Pain in right knee: Secondary | ICD-10-CM | POA: Diagnosis not present

## 2022-03-10 ENCOUNTER — Ambulatory Visit: Payer: Medicare Other

## 2022-03-10 DIAGNOSIS — G8929 Other chronic pain: Secondary | ICD-10-CM | POA: Diagnosis not present

## 2022-03-10 DIAGNOSIS — M25662 Stiffness of left knee, not elsewhere classified: Secondary | ICD-10-CM | POA: Diagnosis not present

## 2022-03-10 DIAGNOSIS — M25661 Stiffness of right knee, not elsewhere classified: Secondary | ICD-10-CM

## 2022-03-10 DIAGNOSIS — R2681 Unsteadiness on feet: Secondary | ICD-10-CM | POA: Diagnosis not present

## 2022-03-10 DIAGNOSIS — M6281 Muscle weakness (generalized): Secondary | ICD-10-CM

## 2022-03-10 DIAGNOSIS — M25562 Pain in left knee: Secondary | ICD-10-CM | POA: Diagnosis not present

## 2022-03-10 DIAGNOSIS — M25561 Pain in right knee: Secondary | ICD-10-CM | POA: Diagnosis not present

## 2022-03-10 NOTE — Therapy (Signed)
OUTPATIENT PHYSICAL THERAPY TREATMENT NOTE  Patient Name: Sharon Knight MRN: [id number] DOB:[birth year], 76 y.o., female Today's Date: 03/10/2022  PCP: Billey Gosling, MD  REFERRING PROVIDER: Dr. [name]    END OF SESSION:   PT End of Session - 03/10/22 1426     Visit Number 13    Number of Visits 16    Date for PT Re-Evaluation 03/18/22    Authorization Type BCBS    PT Start Time 1420    PT Stop Time 1500    PT Time Calculation (min) 40 min    Equipment Utilized During Treatment Other (comment)   quad cane   Activity Tolerance Patient tolerated treatment well    Behavior During Therapy [facility] for tasks assessed/performed                        Past Medical History:  Diagnosis Date   Arthritis    Bilateral cataracts    Dr Kathrin Penner   Exertional dyspnea 12/19/2020   Hyperlipidemia    Hypertension    Past Surgical History:  Procedure Laterality Date   COLONOSCOPY     Dr Fuller Plan   NEPHRECTOMY     right; for cyst on kidney   OVARIAN CYST REMOVAL     Patient Active Problem List   Diagnosis Date Noted   Vertigo 10/23/2021   Exertional dyspnea 12/19/2020   Aortic atherosclerosis ([place]) 10/16/2020   Acute pain of left knee 09/16/2020   Trigger finger, right ring finger 03/17/2019   CKD stage 3, 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 03/16/2019   Diabetes mellitus without complication ([place]) 37/02/6268   Bilateral low back pain 03/17/2018   Gout 12/21/2017   Lobar pneumonia ([place]) 12/10/2017   Paroxysmal atrial fibrillation ([place]) 12/10/2017   History of colonic polyps 10/22/2014   History of nephrectomy, unilateral 11/13/2012   Vitamin D deficiency 10/31/2012   ECZEMA 03/03/2010   Benign neoplasm of colon 08/02/2007   Hyperlipidemia 08/02/2007   Essential hypertension 08/02/2007   Osteopenia 08/02/2007    REFERRING DIAG: M25.562,G89.29 (ICD-10-CM) - Chronic pain of left knee   THERAPY DIAG:  Chronic pain of left knee  Stiffness of left knee, not  elsewhere classified  Stiffness of right knee, not elsewhere classified  Muscle weakness (generalized)  Gait instability  Chronic pain of right knee  Rationale for Evaluation and Treatment Rehabilitation   SUBJECTIVE:    SUBJECTIVE STATEMENT: Pt offered no concerns.  PAIN:  Are you having pain? Yes: NPRS scale: 0/10 Pain location: L knee  Pain description: sore  Aggravating factors: standing , walking  Relieving factors: injection,   PERTINENT HISTORY:M25.562,G89.29 (ICD-10-CM) - Chronic pain of left knee    Gout, HTN, Diabetes, chronic knee pain, multi-factorial stress, Vertigo occ. , unsteady gait    PRECAUTIONS: None   WEIGHT BEARING RESTRICTIONS No   PATIENT GOALS Pt would like to have some more stamina.  I want to feel more confident with walking.      OBJECTIVE: (objective measures completed at initial evaluation unless otherwise dated)   DIAGNOSTIC FINDINGS:    MSK: Left knee mild effusion decreased range of motion pain with flexion and extension.  XR : 1. Moderate-to-severe tricompartmental degenerative changes, most pronounced in the medial and patellofemoral compartments. 2. Trace suprapatellar joint effusion. 3. Chondrocalcinosis.   PATIENT SURVEYS:  FOTO 48%;  02/12/22= 47% 03/06/22=58%   COGNITION:           Overall cognitive status: Within functional  limits for tasks assessed                          SENSATION: WFL   EDEMA:  Circumferential: 16 3/8 inch each knee    MUSCLE LENGTH: Hamstrings: tight Thomas test: tight    POSTURE: rounded shoulders, forward head, increased thoracic kyphosis, and flexed trunk  Rt knee valgus    PALPATION: TTP medial L knee    LOWER EXTREMITY ROM:   Active ROM Right eval Left eval Rt./Lt.  03/04/22  Hip flexion       Hip extension     Hip abduction     Hip adduction       Hip internal rotation       Hip external rotation       Knee flexion 121 120   Knee extension 20 19   Ankle dorsiflexion        Ankle plantarflexion       Ankle inversion       Ankle eversion        (Blank rows = not tested)   LOWER EXTREMITY MMT:   MMT Right eval Left eval Rt 02/19/22 Lt 02/1922  Hip flexion 4+ 4 4+ 4  Hip extension _0 Hip abduction 3 3 4+ 4+  Hip adduction        Hip internal rotation        Hip external rotation     4+ 4+  Knee flexion 5 4+ 5 5  Knee extension 5 4+ 5 5  Ankle dorsiflexion        Ankle plantarflexion        Ankle inversion        Ankle eversion         (Blank rows = not tested)       FUNCTIONAL TESTS:  5 times sit to stand: 18.2 sec used hands from blue office chair; 5xSTS= 18.9". 02/24/22= 15.4" 30 seconds chair stand test Timed up and go (TUG): NT on eval  2 minute walk test: NT on eval    GAIT: Distance walked: 150 Assistive device utilized: Quad cane small base Level of assistance: Modified independence Comments: antalgic gait, slow velocity , wide BOS    TODAY'S TREATMENT: [facility] Adult PT Treatment:                                                DATE: 11/723 Therapeutic Exercise: NuStep L6 UE and LE for 6 min  Thomas stretch 1x 20sec each SLR 2x10 3# each SL hip abd 2x10 each LAQ 2x10 5# Bridging 2x10 Supine hip clams 2x10 GTB LAQ 2x10 5# Standing hip ext 2x10 3#  [facility] Adult PT Treatment:                                                DATE: 03/06/22 Therapeutic Exercise: NuStep L6 UE and LE for 8 min  LAQ 3x10 5# Standing skateboard 30 sec x 4, 20" each DF/PF x 15 Hip abduction red looped band 2x15 each side  Hip extension red loop 2x15 each side  Wide squat x 10  LAQ 2x10 5#  Therapeutic Activity:  Completion of  FOTO and review of results  [facility] Adult PT Treatment:                                                DATE: 03/04/22 Therapeutic Exercise: NuStep L5 UE and LE for 8 min  Standing skateboard 30 sec x 3 Heel raise x 15 Ankle DF alternating x 10  Alternating high march x 10  Hip abduction red looped band x 15 each  side  Hip extension red loop x 15 each side  Wide squat x 10  Standing hip flexor each side , fatigued Supine ant hip stretch off table with opposite knee to chest using sheet  Hamstring and ITB stretch 30 sec each x 2  Bridge x 15 Sidelying hip ext/abd x 10 , 2 sets      PATIENT EDUCATION:  Education details: HEP, POC  Person educated: Patient Education method: Explanation and Handouts Education comprehension: verbalized understanding and needs further education     HOME EXERCISE PROGRAM: Access Code: [email or web address] URL: [email or web address] Date: 02/12/2022 Prepared by: Gar Ponto  ASSESSMENT:   CLINICAL IMPRESSION: Pt participated in PT for pelvic/LE flexibility and strengthening. Reviewed HEP in preparation for DC the next PT session. Will reassess goals and continue review of HEP for pt to continue therapeutic therex and activities after DC. Pt tolerated PT today without adverse effects.  OBJECTIVE IMPAIRMENTS Abnormal gait, decreased activity tolerance, decreased balance, decreased cognition, decreased endurance, decreased knowledge of use of DME, decreased mobility, difficulty walking, decreased ROM, decreased strength, increased edema, increased fascial restrictions, impaired flexibility, postural dysfunction, and pain.    ACTIVITY LIMITATIONS carrying, lifting, bending, sitting, standing, squatting, sleeping, stairs, transfers, bed mobility, locomotion level, and caring for others   PARTICIPATION LIMITATIONS: meal prep, cleaning, laundry, shopping, community activity, yard work, and     PERSONAL FACTORS Behavior pattern, Past/current experiences, Time since onset of injury/illness/exacerbation, and 3+ comorbidities: OA, diabetes, stress  are also affecting patient's functional outcome.     GOALS: LONG TERM GOALS: Target date: 03/04/2022    Pt will be able to stand without UEs x 5 in < 20 sec to demo improved functional strength  Baseline:  Status:  02/05/22=Standing feet a part and together 90" each Goal status: Met   2.  Pt will be able to stand, walk in the community for 20 min without undue fatigue Baseline:  Status: 02/19/22: Pt reports being able to walk in a grocery store c a cart for 20 mins Goal status: Improving   3.  Pt will be able to show I with HEP for hip and knee mobility, strength  Baseline:  Status: pt demonstrates proper completion of HEP and reports completing daily Goal status: Ongoing   4.  Pt will be able to increase hip strength to at least 4/5 to optimize gait stability  Baseline:  Status: 02/19/22=See flow sheets Goal status: MET   5.  Pt will ambulate with cane and improved stride length, trunk and hip extension when cued.  Baseline:  Status: 02/24/22 Improved stride length bilat, pass the opposite foot. Goal status: MET    PLAN: PT FREQUENCY: 2x/week   PT DURATION: 8 weeks   PLANNED INTERVENTIONS: Therapeutic exercises, Therapeutic activity, Neuromuscular re-education, Balance training, Gait training, Patient/Family education, Self Care, Joint mobilization, DME instructions, Aquatic Therapy, Cryotherapy, Moist heat, Taping, Manual therapy, and Re-evaluation   PLAN  FOR NEXT SESSION: HEP, Nustep, hip strength, ant hip stretching. Consider dizziness with supine and rolling    [employer] MS, PT 03/10/22 3:02 PM

## 2022-03-12 NOTE — Therapy (Signed)
OUTPATIENT PHYSICAL THERAPY TREATMENT NOTE/Discharge  Patient Name: Sharon Knight MRN: [id number] DOB:May 18, 1945, 76 y.o., female Today's Date: 03/13/2022  PCP: Billey Gosling, MD  REFERRING PROVIDER: Dr. [name]    END OF SESSION:   PT End of Session - 03/13/22 1407     Visit Number 14    Number of Visits 16    Date for PT Re-Evaluation 03/18/22    Authorization Type BCBS    PT Start Time 1402    PT Stop Time 1445    PT Time Calculation (min) 43 min    Equipment Utilized During Treatment Other (comment)   quad cane   Activity Tolerance Patient tolerated treatment well    Behavior During Therapy [facility] for tasks assessed/performed                        Past Medical History:  Diagnosis Date   Arthritis    Bilateral cataracts    Dr Kathrin Penner   Exertional dyspnea 12/19/2020   Hyperlipidemia    Hypertension    Past Surgical History:  Procedure Laterality Date   COLONOSCOPY     Dr Fuller Plan   NEPHRECTOMY     right; for cyst on kidney   OVARIAN CYST REMOVAL     Patient Active Problem List   Diagnosis Date Noted   Vertigo 10/23/2021   Exertional dyspnea 12/19/2020   Aortic atherosclerosis ([place]) 10/16/2020   Acute pain of left knee 09/16/2020   Trigger finger, right ring finger 03/17/2019   CKD stage 3, 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 03/16/2019   Diabetes mellitus without complication ([place]) 81/27/5170   Bilateral low back pain 03/17/2018   Gout 12/21/2017   Lobar pneumonia ([place]) 12/10/2017   Paroxysmal atrial fibrillation ([place]) 12/10/2017   History of colonic polyps 10/22/2014   History of nephrectomy, unilateral 11/13/2012   Vitamin D deficiency 10/31/2012   ECZEMA 03/03/2010   Benign neoplasm of colon 08/02/2007   Hyperlipidemia 08/02/2007   Essential hypertension 08/02/2007   Osteopenia 08/02/2007    REFERRING DIAG: M25.562,G89.29 (ICD-10-CM) - Chronic pain of left knee   THERAPY DIAG:  Chronic pain of left knee  Stiffness of left  knee, not elsewhere classified  Stiffness of right knee, not elsewhere classified  Muscle weakness (generalized)  Gait instability  Chronic pain of right knee  Rationale for Evaluation and Treatment Rehabilitation   SUBJECTIVE:    SUBJECTIVE STATEMENT: Pt reports being pleased with her progress.  PAIN:  Are you having pain? Yes: NPRS scale: 0/10 Pain location: L knee  Pain description: sore  Aggravating factors: standing , walking  Relieving factors: injection,   PERTINENT HISTORY:M25.562,G89.29 (ICD-10-CM) - Chronic pain of left knee    Gout, HTN, Diabetes, chronic knee pain, multi-factorial stress, Vertigo occ. , unsteady gait    PRECAUTIONS: None   WEIGHT BEARING RESTRICTIONS No   PATIENT GOALS Pt would like to have some more stamina.  I want to feel more confident with walking.      OBJECTIVE: (objective measures completed at initial evaluation unless otherwise dated)   DIAGNOSTIC FINDINGS:    MSK: Left knee mild effusion decreased range of motion pain with flexion and extension.  XR : 1. Moderate-to-severe tricompartmental degenerative changes, most pronounced in the medial and patellofemoral compartments. 2. Trace suprapatellar joint effusion. 3. Chondrocalcinosis.   PATIENT SURVEYS:  FOTO 48%;  02/12/22= 47% 03/06/22=58%   COGNITION:           Overall cognitive  status: Within functional limits for tasks assessed                          SENSATION: WFL   EDEMA:  Circumferential: 16 3/8 inch each knee    MUSCLE LENGTH: Hamstrings: tight Thomas test: tight    POSTURE: rounded shoulders, forward head, increased thoracic kyphosis, and flexed trunk  Rt knee valgus    PALPATION: TTP medial L knee    LOWER EXTREMITY ROM:   Active ROM Right eval Left eval Rt./Lt.  03/04/22  Hip flexion       Hip extension     Hip abduction     Hip adduction       Hip internal rotation       Hip external rotation       Knee flexion 121 120   Knee extension  20 19   Ankle dorsiflexion       Ankle plantarflexion       Ankle inversion       Ankle eversion        (Blank rows = not tested)   LOWER EXTREMITY MMT:   MMT Right eval Left eval Rt 02/19/22 Lt 02/1922  Hip flexion 4+ 4 4+ 4  Hip extension _0 Hip abduction 3 3 4+ 4+  Hip adduction        Hip internal rotation        Hip external rotation     4+ 4+  Knee flexion 5 4+ 5 5  Knee extension 5 4+ 5 5  Ankle dorsiflexion        Ankle plantarflexion        Ankle inversion        Ankle eversion         (Blank rows = not tested)       FUNCTIONAL TESTS:  5 times sit to stand: 18.2 sec used hands from blue office chair; 5xSTS= 18.9". 02/24/22= 15.4" 30 seconds chair stand test Timed up and go (TUG): NT on eval  2 minute walk test: NT on eval    GAIT: Distance walked: 150 Assistive device utilized: Quad cane small base Level of assistance: Modified independence Comments: antalgic gait, slow velocity , wide BOS    TODAY'S TREATMENT: [facility] Adult PT Treatment:                                                DATE: 03/13/22 Therapeutic Exercise: NuStep L6 UE and LE for 6 min  Thomas stretch 1x 20sec each SLR 2x10 3# each SL hip abd 2x10 3# each LAQ 2x10 5# Bridging 2x10 Supine hip clams 2x10 GTB LAQ 2x10 5# Standing hip ext 2x10 3# SLSs Tandem standing Final HEP  [facility] Adult PT Treatment:                                                DATE: 11/723 Therapeutic Exercise: NuStep L6 UE and LE for 6 min  Thomas stretch 1x 20sec each SLR 2x10 3# each SL hip abd 2x10 each LAQ 2x10 5# Bridging 2x10 Supine hip clams 2x10 GTB LAQ 2x10 5# Standing hip ext 2x10 3#  [place] Adult PT  Treatment:                                                DATE: 03/06/22 Therapeutic Exercise: NuStep L6 UE and LE for 8 min  LAQ 3x10 5# Standing skateboard 30 sec x 4, 20" each DF/PF x 15 Hip abduction red looped band 2x15 each side  Hip extension red loop 2x15 each side  Wide squat x 10   LAQ 2x10 5#  Therapeutic Activity:  Completion of FOTO and review of results     PATIENT EDUCATION:  Education details: HEP, POC  Person educated: Patient Education method: Explanation and Handouts Education comprehension: verbalized understanding and needs further education     HOME EXERCISE PROGRAM: Access Code: [email or web address] URL: [email or web address] Date: 02/12/2022 Prepared by: Gar Ponto  ASSESSMENT:   CLINICAL IMPRESSION: Pt completed her last PT session today. PT was provided for strengthening and balance, and to finalize pt's HEP. Pt has made very good progress regarding strength, balance,and functional. All PT goals have been met, see below. Pt is    Ind in a HEP to maintain achieved LOF.   OBJECTIVE IMPAIRMENTS Abnormal gait, decreased activity tolerance, decreased balance, decreased cognition, decreased endurance, decreased knowledge of use of DME, decreased mobility, difficulty walking, decreased ROM, decreased strength, increased edema, increased fascial restrictions, impaired flexibility, postural dysfunction, and pain.    ACTIVITY LIMITATIONS carrying, lifting, bending, sitting, standing, squatting, sleeping, stairs, transfers, bed mobility, locomotion level, and caring for others   PARTICIPATION LIMITATIONS: meal prep, cleaning, laundry, shopping, community activity, yard work, and     PERSONAL FACTORS Behavior pattern, Past/current experiences, Time since onset of injury/illness/exacerbation, and 3+ comorbidities: OA, diabetes, stress  are also affecting patient's functional outcome.     GOALS: LONG TERM GOALS: Target date: 03/04/2022    Pt will be able to stand without UEs x 5 in < 20 sec to demo improved functional strength  Baseline:  Status: 02/05/22=Standing feet a part and together 90" each. Goal status: Met   2.  Pt will be able to stand, walk in the community for 20 min without undue fatigue Baseline:  Status: 02/19/22: Pt reports being  able to walk in a grocery store c a cart for 20 mins. 03/13/22= Pt reports improved tolerance to walking and standing Goal status: MET   3.  Pt will be able to show I with HEP for hip and knee mobility, strength  Baseline:  Status: pt demonstrates proper completion of HEP and reports completing daily. Goal status: MET   4.  Pt will be able to increase hip strength to at least 4/5 to optimize gait stability  Baseline:  Status: 02/19/22=See flow sheets Goal status: MET   5.  Pt will ambulate with cane and improved stride length, trunk and hip extension when cued.  Baseline:  Status: 02/24/22 Improved stride length bilat, pass the opposite foot. Goal status: MET    PLAN: PT FREQUENCY: 2x/week   PT DURATION: 8 weeks   PLANNED INTERVENTIONS: Therapeutic exercises, Therapeutic activity, Neuromuscular re-education, Balance training, Gait training, Patient/Family education, Self Care, Joint mobilization, DME instructions, Aquatic Therapy, Cryotherapy, Moist heat, Taping, Manual therapy, and Re-evaluation   PLAN FOR NEXT SESSION:   PHYSICAL THERAPY DISCHARGE SUMMARY  Visits from Start of Care: 14  Current functional level related to goals / functional outcomes: See clinical impression and PT goals  Remaining deficits: See clinical impression and PT goals    Education / Equipment: HEP   Patient agrees to discharge. Patient goals were met. Patient is being discharged due to being pleased with the current functional level.    Chandria Rookstool MS, PT 03/13/22 3:06 PM

## 2022-03-13 ENCOUNTER — Ambulatory Visit: Payer: Medicare Other

## 2022-03-13 DIAGNOSIS — M25661 Stiffness of right knee, not elsewhere classified: Secondary | ICD-10-CM

## 2022-03-13 DIAGNOSIS — M6281 Muscle weakness (generalized): Secondary | ICD-10-CM

## 2022-03-13 DIAGNOSIS — M25662 Stiffness of left knee, not elsewhere classified: Secondary | ICD-10-CM | POA: Diagnosis not present

## 2022-03-13 DIAGNOSIS — R2681 Unsteadiness on feet: Secondary | ICD-10-CM

## 2022-03-13 DIAGNOSIS — M25562 Pain in left knee: Secondary | ICD-10-CM | POA: Diagnosis not present

## 2022-03-13 DIAGNOSIS — G8929 Other chronic pain: Secondary | ICD-10-CM | POA: Diagnosis not present

## 2022-03-13 DIAGNOSIS — M25561 Pain in right knee: Secondary | ICD-10-CM | POA: Diagnosis not present

## 2022-03-19 ENCOUNTER — Ambulatory Visit
Admission: RE | Admit: 2022-03-19 | Discharge: 2022-03-19 | Disposition: A | Discharge status: Home / Self Care | Payer: Medicare Other | Source: Ambulatory Visit | Attending: Internal Medicine | Admitting: Internal Medicine

## 2022-03-19 DIAGNOSIS — Z09 Encounter for follow-up examination after completed treatment for conditions other than malignant neoplasm: Secondary | ICD-10-CM

## 2022-03-19 DIAGNOSIS — R921 Mammographic calcification found on diagnostic imaging of breast: Secondary | ICD-10-CM | POA: Diagnosis not present

## 2022-05-03 NOTE — Patient Instructions (Addendum)
Consider having a bone density test.     Blood work was ordered.   The lab is on the first floor.    Medications changes include :   none    A referral was ordered for kidney doctor.     Someone will call you to schedule an appointment.    Return in about 6 months (around 11/03/2022) for follow up.    Health Maintenance, Female Adopting a healthy lifestyle and getting preventive care are important in promoting health and wellness. Ask your health care provider about: The right schedule for you to have regular tests and exams. Things you can do on your own to prevent diseases and keep yourself healthy. What should I know about diet, weight, and exercise? Eat a healthy diet  Eat a diet that includes plenty of vegetables, fruits, low-fat dairy products, and lean protein. Do not eat a lot of foods that are high in solid fats, added sugars, or sodium. Maintain a healthy weight Body mass index (BMI) is used to identify weight problems. It estimates body fat based on height and weight. Your health care provider can help determine your BMI and help you achieve or maintain a healthy weight. Get regular exercise Get regular exercise. This is one of the most important things you can do for your health. Most adults should: Exercise for at least 150 minutes each week. The exercise should increase your heart rate and make you sweat (moderate-intensity exercise). Do strengthening exercises at least twice a week. This is in addition to the moderate-intensity exercise. Spend less time sitting. Even light physical activity can be beneficial. Watch cholesterol and blood lipids Have your blood tested for lipids and cholesterol at 77 years of age, then have this test every 5 years. Have your cholesterol levels checked more often if: Your lipid or cholesterol levels are high. You are older than 77 years of age. You are at high risk for heart disease. What should I know about cancer  screening? Depending on your health history and family history, you may need to have cancer screening at various ages. This may include screening for: Breast cancer. Cervical cancer. Colorectal cancer. Skin cancer. Lung cancer. What should I know about heart disease, diabetes, and high blood pressure? Blood pressure and heart disease High blood pressure causes heart disease and increases the risk of stroke. This is more likely to develop in people who have high blood pressure readings or are overweight. Have your blood pressure checked: Every 3-5 years if you are 66-75 years of age. Every year if you are 76 years old or older. Diabetes Have regular diabetes screenings. This checks your fasting blood sugar level. Have the screening done: Once every three years after age 87 if you are at a normal weight and have a low risk for diabetes. More often and at a younger age if you are overweight or have a high risk for diabetes. What should I know about preventing infection? Hepatitis B If you have a higher risk for hepatitis B, you should be screened for this virus. Talk with your health care provider to find out if you are at risk for hepatitis B infection. Hepatitis C Testing is recommended for: Everyone born from [birth year] through 1966. Anyone with known risk factors for hepatitis C. Sexually transmitted infections (STIs) Get screened for STIs, including gonorrhea and chlamydia, if: You are sexually active and are younger than 77 years of age. You are older than 77 years of age and  your health care provider tells you that you are at risk for this type of infection. Your sexual activity has changed since you were last screened, and you are at increased risk for chlamydia or gonorrhea. Ask your health care provider if you are at risk. Ask your health care provider about whether you are at high risk for HIV. Your health care provider may recommend a prescription medicine to help prevent HIV  infection. If you choose to take medicine to prevent HIV, you should first get tested for HIV. You should then be tested every 3 months for as long as you are taking the medicine. Pregnancy If you are about to stop having your period (premenopausal) and you may become pregnant, seek counseling before you get pregnant. Take 400 to 800 micrograms (mcg) of folic acid every day if you become pregnant. Ask for birth control (contraception) if you want to prevent pregnancy. Osteoporosis and menopause Osteoporosis is a disease in which the bones lose minerals and strength with aging. This can result in bone fractures. If you are 43 years old or older, or if you are at risk for osteoporosis and fractures, ask your health care provider if you should: Be screened for bone loss. Take a calcium or vitamin D supplement to lower your risk of fractures. Be given hormone replacement therapy (HRT) to treat symptoms of menopause. Follow these instructions at home: Alcohol use Do not drink alcohol if: Your health care provider tells you not to drink. You are pregnant, may be pregnant, or are planning to become pregnant. If you drink alcohol: Limit how much you have to: 0-1 drink a day. Know how much alcohol is in your drink. In the [place] one drink equals one 12 oz bottle of beer (355 mL), one 5 oz glass of wine (148 mL), or one 1 oz glass of hard liquor (44 mL). Lifestyle Do not use any products that contain nicotine or tobacco. These products include cigarettes, chewing tobacco, and vaping devices, such as e-cigarettes. If you need help quitting, ask your health care provider. Do not use street drugs. Do not share needles. Ask your health care provider for help if you need support or information about quitting drugs. General instructions Schedule regular health, dental, and eye exams. Stay current with your vaccines. Tell your health care provider if: You often feel depressed. You have ever been abused  or do not feel safe at home. Summary Adopting a healthy lifestyle and getting preventive care are important in promoting health and wellness. Follow your health care provider's instructions about healthy diet, exercising, and getting tested or screened for diseases. Follow your health care provider's instructions on monitoring your cholesterol and blood pressure. This information is not intended to replace advice given to you by your health care provider. Make sure you discuss any questions you have with your health care provider. Document Revised: 09/09/2020 Document Reviewed: 09/09/2020 Elsevier Patient Education  2023 [employer].

## 2022-05-03 NOTE — Progress Notes (Signed)
Subjective:    Patient ID: Sharon Knight, female    DOB: 1945-10-17, 76 y.o.   MRN: [id number]      HPI Sharon Knight is here for a Physical exam.    No concerns.    Medications and allergies reviewed with patient and updated if appropriate.  Current Outpatient Medications on File Prior to Visit  Medication Sig Dispense Refill   acetaminophen (TYLENOL) 650 MG CR tablet Take 650 mg by mouth every 8 (eight) hours as needed for pain.     atorvastatin (LIPITOR) 20 MG tablet TAKE (1/2) TABLET DAILY. 45 tablet 1   Cholecalciferol (VITAMIN D3 PO) Take 1,000 Int'l Units/day by mouth.     diltiazem (CARDIZEM CD) 180 MG 24 hr capsule Take 1 capsule (180 mg total) by mouth daily. 90 capsule 1   Docusate Sodium (STOOL SOFTENER LAXATIVE PO) Take 3-4 capsules by mouth daily.     ELIQUIS 5 MG TABS tablet TAKE ONE TABLET BY MOUTH TWICE DAILY 60 tablet 6   flecainide (TAMBOCOR) 100 MG tablet TAKE ONE TABLET BY MOUTH TWICE DAILY 180 tablet 3   hydrochlorothiazide (HYDRODIURIL) 25 MG tablet TAKE ONE TABLET BY MOUTH ONCE DAILY 90 tablet 2   ketorolac (ACULAR) 0.5 % ophthalmic solution 1 drop 3 (three) times daily.     Misc Natural Products (TART CHERRY ADVANCED PO) Take 2 capsules by mouth 2 (two) times daily.     olmesartan (BENICAR) 5 MG tablet TAKE ONE TABLET BY MOUTH ONCE DAILY. 90 tablet 1   No current facility-administered medications on file prior to visit.    Review of Systems  Constitutional:  Negative for fever.  Eyes:  Negative for visual disturbance.  Respiratory:  Negative for cough, shortness of breath and wheezing.   Cardiovascular:  Negative for chest pain, palpitations and leg swelling.  Gastrointestinal:  Positive for constipation (mild). Negative for abdominal pain, blood in stool, diarrhea and nausea.       Occ gerd  Genitourinary:  Negative for dysuria.  Musculoskeletal:  Positive for arthralgias (knee) and back pain (lower back).  Skin:  Negative for rash.  Neurological:   Positive for dizziness (occ). Negative for light-headedness and headaches.  Psychiatric/Behavioral:  Positive for dysphoric mood (mild, sometimes). The patient is nervous/anxious (mild, sometimes).        Objective:   Vitals:   05/05/22 1300  BP: 130/60  Pulse: 63  Temp: 98.1 F (36.7 C)  SpO2: 98%   Filed Weights   05/05/22 1300  Weight: 170 lb (77.1 kg)   Body mass index is 28.29 kg/m.  BP Readings from Last 3 Encounters:  05/05/22 130/60  02/24/22 (!) 148/80  01/16/22 108/62    Wt Readings from Last 3 Encounters:  05/05/22 170 lb (77.1 kg)  02/24/22 172 lb (78 kg)  01/16/22 170 lb (77.1 kg)       Physical Exam Constitutional: She appears well-developed and well-nourished. No distress.  HENT:  Head: Normocephalic and atraumatic.  Right Ear: External ear normal. Normal ear canal and TM Left Ear: External ear normal.  Normal ear canal and TM Mouth/Throat: Oropharynx is clear and moist.  Eyes: Conjunctivae normal.  Neck: Neck supple. No tracheal deviation present. No thyromegaly present.  No carotid bruit  Cardiovascular: Normal rate, regular rhythm and normal heart sounds.   No murmur heard.  No edema. Pulmonary/Chest: Effort normal and breath sounds normal. No respiratory distress. She has no wheezes. She has no rales.  Breast: deferred   Abdominal: Soft.  She exhibits no distension. There is no tenderness.  Lymphadenopathy: She has no cervical adenopathy.  Skin: Skin is warm and dry. She is not diaphoretic.  Psychiatric: She has a normal mood and affect. Her behavior is normal.     Lab Results  Component Value Date   WBC 10.1 10/23/2021   HGB 14.1 10/23/2021   HCT 42.5 10/23/2021   PLT 363.0 10/23/2021   GLUCOSE 145 (H) 10/23/2021   CHOL 171 10/23/2021   TRIG 141.0 10/23/2021   HDL 61.80 10/23/2021   LDLCALC 81 10/23/2021   ALT 20 10/23/2021   AST 21 10/23/2021   NA 142 10/23/2021   K 4.5 10/23/2021   CL 104 10/23/2021   CREATININE 1.17  10/23/2021   BUN 26 (H) 10/23/2021   CO2 31 10/23/2021   TSH 2.85 04/17/2021   HGBA1C 7.3 (H) 10/23/2021   MICROALBUR <0.7 04/17/2021         Assessment & Plan:   Physical exam: Screening blood work  ordered Exercise  - physical therapy exercises for knee Weight  encouraged weight loss Substance abuse  none   Reviewed recommended immunizations.   Health Maintenance  Topic Date Due   DEXA SCAN  07/02/2017   DTaP/Tdap/Td (2 - Tdap) 03/03/2020   Diabetic kidney evaluation - Urine ACR  04/17/2022   HEMOGLOBIN A1C  04/24/2022   COVID-19 Vaccine (7 - 2023-24 season) 05/06/2022   OPHTHALMOLOGY EXAM  07/23/2022   Diabetic kidney evaluation - eGFR measurement  10/24/2022   FOOT EXAM  10/24/2022   Pneumonia Vaccine 14+ Years old  Completed   INFLUENZA VACCINE  Completed   Hepatitis C Screening  Completed   Zoster Vaccines- Shingrix  Completed   HPV VACCINES  Aged Out   COLONOSCOPY (Pts 45-70yr Insurance coverage will need to be confirmed)  Discontinued          See Problem List for Assessment and Plan of chronic medical problems.

## 2022-05-05 ENCOUNTER — Ambulatory Visit (INDEPENDENT_AMBULATORY_CARE_PROVIDER_SITE_OTHER): Payer: BLUE CROSS/BLUE SHIELD | Admitting: Internal Medicine

## 2022-05-05 ENCOUNTER — Encounter: Payer: Self-pay | Admitting: Internal Medicine

## 2022-05-05 VITALS — BP 130/60 | HR 63 | Temp 98.1°F | Ht 65.0 in | Wt 170.0 lb

## 2022-05-05 DIAGNOSIS — E119 Type 2 diabetes mellitus without complications: Secondary | ICD-10-CM | POA: Diagnosis not present

## 2022-05-05 DIAGNOSIS — E782 Mixed hyperlipidemia: Secondary | ICD-10-CM

## 2022-05-05 DIAGNOSIS — I10 Essential (primary) hypertension: Secondary | ICD-10-CM | POA: Diagnosis not present

## 2022-05-05 DIAGNOSIS — I700 Atherosclerosis of aorta: Secondary | ICD-10-CM

## 2022-05-05 DIAGNOSIS — Z8739 Personal history of other diseases of the musculoskeletal system and connective tissue: Secondary | ICD-10-CM

## 2022-05-05 DIAGNOSIS — Z0000 Encounter for general adult medical examination without abnormal findings: Secondary | ICD-10-CM

## 2022-05-05 DIAGNOSIS — E559 Vitamin D deficiency, unspecified: Secondary | ICD-10-CM

## 2022-05-05 DIAGNOSIS — I480 Paroxysmal atrial fibrillation: Secondary | ICD-10-CM

## 2022-05-05 DIAGNOSIS — M85851 Other specified disorders of bone density and structure, right thigh: Secondary | ICD-10-CM

## 2022-05-05 DIAGNOSIS — N1831 Chronic kidney disease, stage 3a: Secondary | ICD-10-CM

## 2022-05-05 DIAGNOSIS — M85852 Other specified disorders of bone density and structure, left thigh: Secondary | ICD-10-CM

## 2022-05-05 LAB — COMPREHENSIVE METABOLIC PANEL
ALT: 20 U/L (ref 0–35)
AST: 23 U/L (ref 0–37)
Albumin: 4.5 g/dL (ref 3.5–5.2)
Alkaline Phosphatase: 91 U/L (ref 39–117)
BUN: 26 mg/dL — ABNORMAL HIGH (ref 6–23)
CO2: 30 mEq/L (ref 19–32)
Calcium: 10.3 mg/dL (ref 8.4–10.5)
Chloride: 101 mEq/L (ref 96–112)
Creatinine, Ser: 1.24 mg/dL — ABNORMAL HIGH (ref 0.40–1.20)
GFR: 41.89 mL/min — ABNORMAL LOW (ref >60.00)
Glucose, Bld: 111 mg/dL — ABNORMAL HIGH (ref 70–99)
Potassium: 4.5 mEq/L (ref 3.5–5.1)
Sodium: 140 mEq/L (ref 135–145)
Total Bilirubin: 0.4 mg/dL (ref 0.2–1.2)
Total Protein: 8.1 g/dL (ref 6.0–8.3)

## 2022-05-05 LAB — CBC WITH DIFFERENTIAL/PLATELET
Basophils Absolute: 0.1 10*3/uL (ref 0.0–0.1)
Basophils Relative: 1.3 % (ref 0.0–3.0)
Eosinophils Absolute: 0.4 10*3/uL (ref 0.0–0.7)
Eosinophils Relative: 3.7 % (ref 0.0–5.0)
HCT: 42.4 % (ref 36.0–46.0)
Hemoglobin: 14.6 g/dL (ref 12.0–15.0)
Lymphocytes Relative: 18.2 % (ref 12.0–46.0)
Lymphs Abs: 1.8 10*3/uL (ref 0.7–4.0)
MCHC: 34.5 g/dL (ref 30.0–36.0)
MCV: 90.8 fl (ref 78.0–100.0)
Monocytes Absolute: 0.9 10*3/uL (ref 0.1–1.0)
Monocytes Relative: 9.4 % (ref 3.0–12.0)
Neutro Abs: 6.6 10*3/uL (ref 1.4–7.7)
Neutrophils Relative %: 67.4 % (ref 43.0–77.0)
Platelets: 390 10*3/uL (ref 150.0–400.0)
RBC: 4.67 Mil/uL (ref 3.87–5.11)
RDW: 13 % (ref 11.5–15.5)
WBC: 9.8 10*3/uL (ref 4.0–10.5)

## 2022-05-05 LAB — LIPID PANEL
Cholesterol: 192 mg/dL (ref 0–200)
HDL: 65.7 mg/dL (ref >39.00)
LDL Cholesterol: 97 mg/dL (ref 0–99)
NonHDL: 126.7
Total CHOL/HDL Ratio: 3
Triglycerides: 151 mg/dL — ABNORMAL HIGH (ref 0.0–149.0)
VLDL: 30.2 mg/dL (ref 0.0–40.0)

## 2022-05-05 LAB — MICROALBUMIN / CREATININE URINE RATIO
Creatinine,U: 43 mg/dL
Microalb Creat Ratio: 1.6 mg/g (ref 0.0–30.0)
Microalb, Ur: <0.7 mg/dL (ref 0.0–1.9)

## 2022-05-05 LAB — TSH: TSH: 2.35 u[IU]/mL (ref 0.35–5.50)

## 2022-05-05 LAB — HEMOGLOBIN A1C: Hgb A1c MFr Bld: 7.2 % — ABNORMAL HIGH (ref 4.6–6.5)

## 2022-05-05 LAB — VITAMIN D 25 HYDROXY (VIT D DEFICIENCY, FRACTURES): VITD: 32.31 ng/mL (ref 30.00–100.00)

## 2022-05-05 NOTE — Assessment & Plan Note (Signed)
Chronic Taking vitamin D daily Check vitamin D level  

## 2022-05-05 NOTE — Assessment & Plan Note (Signed)
Chronic Blood pressure well controlled CMP Continue diltiazem 180 mg daily, HCTZ 25 mg daily, Benicar 5 mg daily

## 2022-05-05 NOTE — Assessment & Plan Note (Signed)
Chronic Following with cardiology In sinus rhythm today On diltiazem, Eliquis and flecainide CBC, CMP

## 2022-05-05 NOTE — Assessment & Plan Note (Addendum)
History of gout Last uric acid level 7.4 Discussed starting allopurinol-was on in the past - deferred Taking tart cherry

## 2022-05-05 NOTE — Assessment & Plan Note (Addendum)
Chronic   Lab Results  Component Value Date   HGBA1C 7.3 (H) 10/23/2021   Sugars not ideally controlled Check A1c, urine microalbumin today Continue lifestyle control - advised farxiga - she deferred for now If A1c is still above 7 we will recommend medication Stressed regular exercise, diabetic diet

## 2022-05-05 NOTE — Assessment & Plan Note (Signed)
Chronic Continue atorvastatin 10 mg daily Encouraged healthy diet and regular exercise

## 2022-05-05 NOTE — Assessment & Plan Note (Signed)
Chronic Regular exercise and healthy diet encouraged Check lipid panel  Continue atorvastatin 10 mg daily 

## 2022-05-05 NOTE — Assessment & Plan Note (Signed)
Chronic Discussed starting Wilder Glade for sugars and protection of kidneys Stressed good blood pressure, sugar control Good water intake Avoid NSAIDs

## 2022-05-05 NOTE — Assessment & Plan Note (Addendum)
Chronic DEXA due- deferred Stressed the importance of regular exercise Continue vitamin D supplementation, not taking calcium Advised calcium supplementation-500-600 mg a day and diet high in calcium

## 2022-05-14 DIAGNOSIS — H35352 Cystoid macular degeneration, left eye: Secondary | ICD-10-CM | POA: Diagnosis not present

## 2022-05-14 DIAGNOSIS — H3582 Retinal ischemia: Secondary | ICD-10-CM | POA: Diagnosis not present

## 2022-05-14 DIAGNOSIS — E113291 Type 2 diabetes mellitus with mild nonproliferative diabetic retinopathy without macular edema, right eye: Secondary | ICD-10-CM | POA: Diagnosis not present

## 2022-05-14 DIAGNOSIS — H31092 Other chorioretinal scars, left eye: Secondary | ICD-10-CM | POA: Diagnosis not present

## 2022-06-02 DIAGNOSIS — H52203 Unspecified astigmatism, bilateral: Secondary | ICD-10-CM | POA: Diagnosis not present

## 2022-06-02 LAB — HM DIABETES EYE EXAM

## 2022-06-08 ENCOUNTER — Other Ambulatory Visit: Payer: Self-pay | Admitting: Cardiovascular Disease

## 2022-06-08 NOTE — Telephone Encounter (Signed)
Rx(s) sent to pharmacy electronically.  

## 2022-06-15 DIAGNOSIS — D225 Melanocytic nevi of trunk: Secondary | ICD-10-CM | POA: Diagnosis not present

## 2022-06-15 DIAGNOSIS — D2222 Melanocytic nevi of left ear and external auricular canal: Secondary | ICD-10-CM | POA: Diagnosis not present

## 2022-06-15 DIAGNOSIS — Z08 Encounter for follow-up examination after completed treatment for malignant neoplasm: Secondary | ICD-10-CM | POA: Diagnosis not present

## 2022-06-15 DIAGNOSIS — D492 Neoplasm of unspecified behavior of bone, soft tissue, and skin: Secondary | ICD-10-CM | POA: Diagnosis not present

## 2022-06-15 DIAGNOSIS — L821 Other seborrheic keratosis: Secondary | ICD-10-CM | POA: Diagnosis not present

## 2022-06-15 DIAGNOSIS — L814 Other melanin hyperpigmentation: Secondary | ICD-10-CM | POA: Diagnosis not present

## 2022-06-25 DIAGNOSIS — E113291 Type 2 diabetes mellitus with mild nonproliferative diabetic retinopathy without macular edema, right eye: Secondary | ICD-10-CM | POA: Diagnosis not present

## 2022-06-26 ENCOUNTER — Ambulatory Visit (HOSPITAL_BASED_OUTPATIENT_CLINIC_OR_DEPARTMENT_OTHER): Payer: Medicare Other | Admitting: Family

## 2022-06-29 ENCOUNTER — Other Ambulatory Visit (HOSPITAL_BASED_OUTPATIENT_CLINIC_OR_DEPARTMENT_OTHER): Payer: Self-pay | Admitting: Cardiovascular Disease

## 2022-06-29 NOTE — Telephone Encounter (Signed)
Rx(s) sent to pharmacy electronically.  

## 2022-07-08 ENCOUNTER — Encounter: Payer: Self-pay | Admitting: Internal Medicine

## 2022-07-08 NOTE — Progress Notes (Signed)
Outside notes received. Information abstracted. Notes sent to scan.  

## 2022-07-10 DIAGNOSIS — Z905 Acquired absence of kidney: Secondary | ICD-10-CM | POA: Diagnosis not present

## 2022-07-10 DIAGNOSIS — N1832 Chronic kidney disease, stage 3b: Secondary | ICD-10-CM | POA: Diagnosis not present

## 2022-07-10 DIAGNOSIS — I129 Hypertensive chronic kidney disease with stage 1 through stage 4 chronic kidney disease, or unspecified chronic kidney disease: Secondary | ICD-10-CM | POA: Diagnosis not present

## 2022-07-13 LAB — LAB REPORT - SCANNED
Albumin, Urine POC: 3
Creatinine, POC: 66.2 mg/dL
EGFR: 51

## 2022-07-22 ENCOUNTER — Other Ambulatory Visit: Payer: Self-pay | Admitting: Cardiovascular Disease

## 2022-07-22 DIAGNOSIS — D6859 Other primary thrombophilia: Secondary | ICD-10-CM

## 2022-07-22 NOTE — Telephone Encounter (Signed)
Rx request sent to pharmacy.  

## 2022-07-22 NOTE — Telephone Encounter (Signed)
Please review Eliquis for refill. Thank you! 

## 2022-07-22 NOTE — Telephone Encounter (Signed)
Pt last saw Laurann Montana, NP on 06/23/21, pt is overdue for follow-up.  Pt is scheduled for a follow-up with Laurann Montana, NP on 07/23/22, tomorrow.  Last labs 05/05/22 Creat 1.24, age 77, weight 77.1kg, based on specified criteria pt is on appropriate dosage of Eliquis 5mg  BID for afib.  Will refill rx tomorrow after OV.

## 2022-07-23 ENCOUNTER — Ambulatory Visit (HOSPITAL_BASED_OUTPATIENT_CLINIC_OR_DEPARTMENT_OTHER): Payer: Medicare Other | Admitting: Family

## 2022-07-23 ENCOUNTER — Encounter (HOSPITAL_BASED_OUTPATIENT_CLINIC_OR_DEPARTMENT_OTHER): Payer: Self-pay | Admitting: Family

## 2022-07-23 VITALS — BP 122/78 | HR 74 | Ht 63.0 in | Wt 173.0 lb

## 2022-07-23 DIAGNOSIS — I10 Essential (primary) hypertension: Secondary | ICD-10-CM

## 2022-07-23 DIAGNOSIS — I480 Paroxysmal atrial fibrillation: Secondary | ICD-10-CM

## 2022-07-23 DIAGNOSIS — E782 Mixed hyperlipidemia: Secondary | ICD-10-CM | POA: Diagnosis not present

## 2022-07-23 DIAGNOSIS — D6859 Other primary thrombophilia: Secondary | ICD-10-CM | POA: Diagnosis not present

## 2022-07-23 MED ORDER — ATORVASTATIN CALCIUM 20 MG PO TABS: 10.0000 mg | ORAL_TABLET | Freq: Every day | ORAL | 3 refills | Status: DC

## 2022-07-23 MED ORDER — OLMESARTAN MEDOXOMIL 5 MG PO TABS: 5.0000 mg | ORAL_TABLET | Freq: Every day | ORAL | 0 refills | Status: DC

## 2022-07-23 MED ORDER — DILTIAZEM HCL ER COATED BEADS 180 MG PO CP24: 180.0000 mg | ORAL_CAPSULE | Freq: Every day | ORAL | 3 refills | Status: DC

## 2022-07-23 NOTE — Patient Instructions (Addendum)
Medication Instructions:  Continue your current medications.   Recommend thinking about starting Farxiga as Dr. [name] recommended. It has three main benefits... Lower your blood sugar Prevent kidney disease from progressing Lower your risk of a future cardiovascular event such as a stroke or heart attach  *If you need a refill on your cardiac medications before your next appointment, please call your pharmacy*  Testing/Procedures: Your EKG today looked great!  Follow-Up: At [facility], you and your health needs are our priority.  As part of our continuing mission to provide you with exceptional heart care, we have created designated Provider Care Teams.  These Care Teams include your primary Cardiologist (physician) and Advanced Practice Providers (APPs -  Physician Assistants and Nurse Practitioners) who all work together to provide you with the care you need, when you need it.  We recommend signing up for the patient portal called "MyChart".  Sign up information is provided on this After Visit Summary.  MyChart is used to connect with patients for Virtual Visits (Telemedicine).  Patients are able to view lab/test results, encounter notes, upcoming appointments, etc.  Non-urgent messages can be sent to your provider as well.   To learn more about what you can do with MyChart, go to [email or web address].    Your next appointment:   1 year(s)  Provider:   Skeet Latch, MD or Laurann Montana, NP    Other Instructions  Heart Healthy Diet Recommendations: A low-salt diet is recommended. Meats should be grilled, baked, or boiled. Avoid fried foods. Focus on lean protein sources like fish or chicken with vegetables and fruits. The American Heart Association is a Microbiologist!  American Heart Association Diet and Lifeystyle Recommendations   Exercise recommendations: The American Heart Association recommends 150 minutes of moderate intensity exercise weekly. Try 30 minutes  of moderate intensity exercise 4-5 times per week. This could include walking, jogging, or swimming. Keep up the good work with your physical therapy knee exercises.

## 2022-07-23 NOTE — Telephone Encounter (Signed)
Eliquis 5mg  refill request received. Patient is 77 years old, weight-78.5kg, Crea-1.24 on 05/05/22, Diagnosis-Afib, and last seen by Laurann Montana on today, 07/23/22. Dose is appropriate based on dosing criteria. Will send in refill to requested pharmacy.

## 2022-07-23 NOTE — Progress Notes (Signed)
Office Visit    Patient Name: Sharon Knight Date of Encounter: 07/25/2022  PCP:  Binnie Rail, MD   [facility]  Cardiologist:  Skeet Latch, MD  Advanced Practice Provider:  No care team member to display Electrophysiologist:  Will Meredith Leeds, MD      Chief Complaint    Sharon Knight is a 77 y.o. female with a hx of PAF, HTN, HLD, DM2, right nephrectomy presents today for follow up of paroxysmal atrial fibrillation.    Past Medical History    Past Medical History:  Diagnosis Date   Arthritis    Bilateral cataracts    Dr Kathrin Penner   Exertional dyspnea 12/19/2020   Hyperlipidemia    Hypertension    Past Surgical History:  Procedure Laterality Date   COLONOSCOPY     Dr Fuller Plan   NEPHRECTOMY     right; for cyst on kidney   OVARIAN CYST REMOVAL      Allergies  Allergies  Allergen Reactions   Ibuprofen Other (See Comments)    Dizziness "made me woozy"    History of Present Illness    Sharon Knight is a 77 y.o. female with a hx of PAF, HTN, HLD, DM2, right nephrectomy last seen 12/2020 by Dr. Oval Linsey.  She was admitted 12/2017 with atrial fibrillation with RVR in setting of sepsis and pneumonia. Home dose Metoprolol increased and Eliquis started. Echo 12/2017 LVEF 65-70%, moderate LVH. At follow up 12/22/17 she was in NSR. She was seen by Dr. Curt Bears 12/30/17 and started on Flecainide. Metoprolol was discontinued due to bradycardia. HCYZ increased and Olmesartan added due to BP.   She was seen 12/2020 noting fatigue with TSH normal and Myoview 12/31/20 was low risk with no evidence of ischemia. Last seen 06/23/21 doing overall well with no changes made. She stayed active caretaking for her husband and managing her home.   Presents today for follow up. Has established with nephrology Dr. 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 Has a friend helping her follow her ancestry and recently found  some new relatives including a  half brother's daughter. Recently lost her brother in law and has had to arrange his services which has been understandably stressful. Reports no shortness of breath nor dyspnea on exertion. Reports no chest pain, pressure, or tightness. No edema, orthopnea, PND. Reports no palpitations.    EKGs/Labs/Other Studies Reviewed:   The following studies were reviewed today:  Myoview 12/31/20  Lexiscan stress is electrically negative for ischemia   Myoview scan shows normal perfusion.   Nuclear stress EF: 77 %. The left ventricular ejection fraction is hyperdynamic (>65%). Left ventricular function is normal. End diastolic cavity size is normal.   Low risk scan.   ETT 01/25/18: 4.6 METS on Bruce protocol.  No ischemia.   Echocardiogram 12/11/2017 Study Conclusions - Left ventricle: The cavity size was normal. Wall thickness was   increased in a pattern of moderate LVH. Systolic function was   vigorous. The estimated ejection fraction was in the range of 65%   to 70%. Wall motion was normal; there were no regional wall   motion abnormalities. The study was not technically sufficient to   allow evaluation of LV diastolic dysfunction due to atrial   fibrillation. - Aortic valve: Valve area (VTI): 2.14 cm^2. Valve area (Vmax):   2.43 cm^2. Valve area (Vmean): 2.33 cm^2. - Mitral valve: Valve area by  continuity equation (using LVOT   flow): 1.61 cm^2. - Atrial septum: No defect or patent foramen ovale was identified   30 Day Event Monitor 12/23/17:    Quality: Fair.  Baseline artifact. Predominant rhythm: sinus rhythm Average heart rate: 68 bpm Max heart rate: 174 bpm Min heart rate: 38 bpm 199 pauses >3 seconds Pauses noted up to 6 seconds. All occurred over night or early morning. PVCs Atrial flutter 16% (71% controlled, <1% slow, 29% rapid)   EKG:  EKG is ordered today.  The ekg ordered today demonstrates NSR 74 bpm with first degree AV block PR 214 with  no acute ST/T wave changes.   Recent Labs: 05/05/2022: ALT 20; BUN 26; Creatinine, Ser 1.24; Hemoglobin 14.6; Platelets 390.0; Potassium 4.5; Sodium 140; TSH 2.35  Recent Lipid Panel    Component Value Date/Time   CHOL 192 05/05/2022 1413   CHOL 174 12/05/2012 1538   TRIG 151.0 (H) 05/05/2022 1413   TRIG 109 12/05/2012 1538   HDL 65.70 05/05/2022 1413   HDL 60 12/05/2012 1538   CHOLHDL 3 05/05/2022 1413   VLDL 30.2 05/05/2022 1413   LDLCALC 97 05/05/2022 1413   LDLCALC 92 12/05/2012 1538    Risk Assessment/Calculations:    CHA2DS2-VASc Score = 5   This indicates a 7.2% annual risk of stroke. The patient's score is based upon: CHF History: 0 HTN History: 1 Diabetes History: 1 Stroke History: 0 Vascular Disease History: 0 Age Score: 2 Gender Score: 1       Home Medications   Current Meds  Medication Sig   acetaminophen (TYLENOL) 650 MG CR tablet Take 650 mg by mouth every 8 (eight) hours as needed for pain.   Cholecalciferol (VITAMIN D3 PO) Take 1,000 Int'l Units/day by mouth.   Docusate Sodium (STOOL SOFTENER LAXATIVE PO) Take 3-4 capsules by mouth daily.   flecainide (TAMBOCOR) 100 MG tablet TAKE ONE TABLET BY MOUTH TWICE DAILY   hydrochlorothiazide (HYDRODIURIL) 25 MG tablet TAKE ONE TABLET BY MOUTH ONCE DAILY   ketorolac (ACULAR) 0.5 % ophthalmic solution 1 drop 3 (three) times daily.   Misc Natural Products (TART CHERRY ADVANCED PO) Take 2 capsules by mouth 2 (two) times daily.   [DISCONTINUED] atorvastatin (LIPITOR) 20 MG tablet TAKE 1/2 TABLET BY MOUTH DAILY **NEED OFFICE VISIT**   [DISCONTINUED] diltiazem (CARDIZEM CD) 180 MG 24 hr capsule Take 1 capsule (180 mg total) by mouth daily.   [DISCONTINUED] ELIQUIS 5 MG TABS tablet TAKE ONE TABLET BY MOUTH TWICE DAILY   [DISCONTINUED] olmesartan (BENICAR) 5 MG tablet TAKE ONE TABLET BY MOUTH ONCE DAILY     Review of Systems      All other systems reviewed and are otherwise negative except as noted  above.  Physical Exam    VS:  BP 122/78 (BP Location: Right Arm, Patient Position: Sitting, Cuff Size: Normal)   Pulse 74   Ht 5\' 3"  (1.6 m)   Wt 173 lb (78.5 kg)   BMI 30.65 kg/m  , BMI Body mass index is 30.65 kg/m.  Wt Readings from Last 3 Encounters:  07/23/22 173 lb (78.5 kg)  05/05/22 170 lb (77.1 kg)  02/24/22 172 lb (78 kg)    GEN: Well nourished, overweight,  well developed, in no acute distress. HEENT: normal. Neck: Supple, no JVD, carotid bruits, or masses. Cardiac: RRR, no murmurs, rubs, or gallops. No clubbing, cyanosis, edema.  Radials/PT 2+ and equal bilaterally.  Respiratory:  Respirations regular and unlabored, clear to auscultation bilaterally. GI: Soft,  nontender, nondistended. MS: No deformity or atrophy. Skin: Warm and dry, no rash. Neuro:  Strength and sensation are intact. Psych: Normal affect.  Assessment & Plan    PAF / Chronic anticoagulation - EKG today shows normal sinus rhythm.  05/05/22 Hb 14.6. CHA2DS2-VASc Score = 5 [CHF History: 0, HTN History: 1, Diabetes History: 1, Stroke History: 0, Vascular Disease History: 0, Age Score: 2, Gender Score: 1].  Therefore, the patient's annual risk of stroke is 7.2 %.    Continue Eliquis 5mg  BID (does not meet does restriction, Flecainide 100mg  BID, Diltiazem 180mg  QD  CKD III - Careful titration of diuretic and antihypertensive.  Does hav eone kidney and follows with nephrology.   HTN - BP well controlled. Continue current antihypertensive regimen including HCTZ 25mg  QD, Olmesartan 5mg  QD, Diltiazem 180mg  QD.   HLD - 05/05/22 LDL 97. At goal of <100.  Continue Atorvastatin 10mg  daily. Refill provided.  Gout / chronic knee pain - Follows with sports medicine and PCP. Has completed outpatient PT and is working on her exercises at home.  DM2 - 05/2022 A1c 7.2.  Continue to follow with PCP. Encouraged to consider Wilder Glade as recommended by PCP - discussed cardioprotective benefit.   Disposition: Follow up in 1  year(s) with Skeet Latch, MD or APP.  Signed, Loel Dubonnet, NP 07/25/2022, 7:33 PM [facility]

## 2022-08-18 ENCOUNTER — Telehealth: Payer: Self-pay | Admitting: Cardiovascular Disease

## 2022-08-18 DIAGNOSIS — E782 Mixed hyperlipidemia: Secondary | ICD-10-CM

## 2022-08-18 MED ORDER — ATORVASTATIN CALCIUM 10 MG PO TABS
10.0000 mg | ORAL_TABLET | Freq: Every day | ORAL | 3 refills | Status: DC
Start: 2022-08-18 — End: 2023-08-10

## 2022-08-18 NOTE — Telephone Encounter (Signed)
*  STAT* If patient is at the pharmacy, call can be transferred to refill team.   1. Which medications need to be refilled? (please list name of each medication and dose if known) atorvastatin (LIPITOR) 20 MG tablet   2. Which pharmacy/location (including street and city if local pharmacy) is medication to be sent to? [facility] [place], [place] - [address]    3. Do they need a 30 day or 90 day supply? 30

## 2022-08-18 NOTE — Telephone Encounter (Signed)
Called patient to ensure she is taking the  dose, she is. She is okay with sending in the  tablet so she does not have to keep splitting them and would like a 90 day supply. 90 day supply with refills sent to pharmacy.

## 2022-10-14 DIAGNOSIS — H3582 Retinal ischemia: Secondary | ICD-10-CM | POA: Diagnosis not present

## 2022-10-14 DIAGNOSIS — H31093 Other chorioretinal scars, bilateral: Secondary | ICD-10-CM | POA: Diagnosis not present

## 2022-10-14 DIAGNOSIS — E113291 Type 2 diabetes mellitus with mild nonproliferative diabetic retinopathy without macular edema, right eye: Secondary | ICD-10-CM | POA: Diagnosis not present

## 2022-10-14 DIAGNOSIS — E113212 Type 2 diabetes mellitus with mild nonproliferative diabetic retinopathy with macular edema, left eye: Secondary | ICD-10-CM | POA: Diagnosis not present

## 2022-10-26 ENCOUNTER — Ambulatory Visit (INDEPENDENT_AMBULATORY_CARE_PROVIDER_SITE_OTHER): Payer: Medicare Other | Admitting: Internal Medicine

## 2022-10-26 DIAGNOSIS — Z0000 Encounter for general adult medical examination without abnormal findings: Secondary | ICD-10-CM

## 2022-10-26 NOTE — Progress Notes (Signed)
Subjective:   Sharon Knight is a 77 y.o. female who presents for Medicare Annual (Subsequent) preventive examination.  Visit Complete: Virtual  I connected with  Sharon Knight on 10/26/22 by a audio enabled telemedicine application and verified that I am speaking with the correct person using two identifiers.  Patient Location: Home  Provider Location: Home Office  I discussed the limitations of evaluation and management by telemedicine. The patient expressed understanding and agreed to proceed.  Patient Medicare AWV questionnaire was completed by the patient on 10/26/2022; I have confirmed that all information answered by patient is correct and no changes since this date.      Objective:    There were no vitals filed for this visit. There is no height or weight on file to calculate BMI.     11/11/2021    2:22 PM 10/30/2020   10:20 AM 09/14/2019   10:27 AM 05/24/2018   11:49 AM 12/10/2017    4:26 PM 12/10/2017    1:48 PM 02/19/2017   10:30 AM  Advanced Directives  Does Patient Have a Medical Advance Directive? No No No No No No No  Would patient like information on creating a medical advance directive? No - Patient declined No - Patient declined No - Patient declined Yes (MAU/Ambulatory/Procedural Areas - Information given) No - Patient declined  No - Patient declined    Current Medications (verified) Outpatient Encounter Medications as of 10/26/2022  Medication Sig   acetaminophen (TYLENOL) 650 MG CR tablet Take 650 mg by mouth every 8 (eight) hours as needed for pain.   apixaban (ELIQUIS) 5 MG TABS tablet TAKE ONE TABLET BY MOUTH TWICE DAILY   atorvastatin (LIPITOR) 10 MG tablet Take 1 tablet (10 mg total) by mouth daily.   Cholecalciferol (VITAMIN D3 PO) Take 1,000 Int'l Units/day by mouth.   diltiazem (CARDIZEM CD) 180 MG 24 hr capsule Take 1 capsule (180 mg total) by mouth daily.   Docusate Sodium (STOOL SOFTENER LAXATIVE PO) Take 3-4 capsules by mouth daily.    flecainide (TAMBOCOR) 100 MG tablet TAKE ONE TABLET BY MOUTH TWICE DAILY   hydrochlorothiazide (HYDRODIURIL) 25 MG tablet TAKE ONE TABLET BY MOUTH ONCE DAILY   ketorolac (ACULAR) 0.5 % ophthalmic solution 1 drop 3 (three) times daily.   Misc Natural Products (TART CHERRY ADVANCED PO) Take 2 capsules by mouth 2 (two) times daily.   olmesartan (BENICAR) 5 MG tablet Take 1 tablet (5 mg total) by mouth daily.   No facility-administered encounter medications on file as of 10/26/2022.    Allergies (verified) Ibuprofen   History: Past Medical History:  Diagnosis Date   Arthritis    Bilateral cataracts    Dr Dagoberto Ligas   Exertional dyspnea 12/19/2020   Hyperlipidemia    Hypertension    Past Surgical History:  Procedure Laterality Date   COLONOSCOPY     Dr Russella Dar   NEPHRECTOMY     right; for cyst on kidney   OVARIAN CYST REMOVAL     Family History  Adopted: Yes  Problem Relation Age of Onset   Colon cancer Neg Hx    Stomach cancer Neg Hx    Breast cancer Neg Hx    Social History   Socioeconomic History   Marital status: Married    Spouse name: Not on file   Number of children: Not on file   Years of education: Not on file   Highest education level: Not on file  Occupational History   Not on  file  Tobacco Use   Smoking status: Former    Types: Cigarettes    Quit date: 08/27/1984    Years since quitting: 38.1   Smokeless tobacco: Never   Tobacco comments:    smoked age [age], up to 1.5-2 ppd  Vaping Use   Vaping Use: Never used  Substance and Sexual Activity   Alcohol use: Yes    Alcohol/week: 3.0 standard drinks of alcohol    Types: 3 Cans of beer per week    Comment: 3 x weekly   Drug use: No   Sexual activity: Not on file  Other Topics Concern   Not on file  Social History Narrative   Exercise: none   Social Determinants of Health   Financial Resource Strain: Low Risk  (11/11/2021)   Overall Financial Resource Strain (CARDIA)    Difficulty of Paying Living  Expenses: Not hard at all  Food Insecurity: No Food Insecurity (11/11/2021)   Hunger Vital Sign    Worried About Running Out of Food in the Last Year: Never true    Ran Out of Food in the Last Year: Never true  Transportation Needs: No Transportation Needs (11/11/2021)   PRAPARE - Administrator, Civil Service (Medical): No    Lack of Transportation (Non-Medical): No  Physical Activity: Inactive (11/11/2021)   Exercise Vital Sign    Days of Exercise per Week: 0 days    Minutes of Exercise per Session: 0 min  Stress: No Stress Concern Present (11/11/2021)   [employer] of Occupational Health - Occupational Stress Questionnaire    Feeling of Stress : Not at all  Social Connections: Socially Integrated (11/11/2021)   Social Connection and Isolation Panel [NHANES]    Frequency of Communication with Friends and Family: More than three times a week    Frequency of Social Gatherings with Friends and Family: More than three times a week    Attends Religious Services: More than 4 times per year    Active Member of [employer] or Organizations: No    Attends Engineer, structural: More than 4 times per year    Marital Status: Married    Tobacco Counseling Counseling given: Not Answered Tobacco comments: smoked age 50-40, up to 1.5-2 ppd   Clinical Intake:                        Activities of Daily Living    11/11/2021    2:26 PM  In your present state of health, do you have any difficulty performing the following activities:  Hearing? 1  Vision? 0  Difficulty concentrating or making decisions? 1  Walking or climbing stairs? 0  Dressing or bathing? 0  Doing errands, shopping? 0  Preparing Food and eating ? N  Using the Toilet? N  In the past six months, have you accidently leaked urine? Y  Do you have problems with loss of bowel control? N  Managing your Medications? N  Managing your Finances? N  Housekeeping or managing your Housekeeping? N     Patient Care Team: Pincus Sanes, MD as PCP - General (Internal Medicine) Chilton Si, MD as PCP - Cardiology (Cardiology) Regan Lemming, MD as PCP - Electrophysiology (Cardiology) Carmela Rima, MD as Consulting Physician (Ophthalmology) [place], [facility] (Ophthalmology) Erlene Quan, Vinnie Level, [facility] (Inactive) (Pharmacist) Arita Miss, MD as Consulting Physician (Nephrology)  Indicate any recent Medical Services you may have received from other than [facility] providers  in the past year (date may be approximate).     Assessment:   This is a routine wellness examination for [place].  Hearing/Vision screen No results found.  Dietary issues and exercise activities discussed:     Goals Addressed   None   Depression Screen    05/05/2022    1:10 PM 11/11/2021    2:25 PM 10/30/2020   10:23 AM 10/16/2020   10:29 AM 09/14/2019   10:45 AM 03/17/2019   10:59 AM 09/15/2018   11:09 AM  PHQ 2/9 Scores  PHQ - 2 Score 0 0 0 0 1 0 0  PHQ- 9 Score 1    2      Fall Risk    05/05/2022    1:06 PM 11/11/2021    2:24 PM 10/30/2020   10:22 AM 09/16/2020    2:14 PM 09/14/2019   10:31 AM  Fall Risk   Falls in the past year? 0 0 0 0 1  Number falls in past yr: 0 0 0 0 0  Injury with Fall? 0 0 0 0 0  Risk for fall due to : No Fall Risks No Fall Risks No Fall Risks No Fall Risks Impaired balance/gait;Impaired vision  Follow up Falls evaluation completed Falls evaluation completed Falls evaluation completed Falls evaluation completed Falls evaluation completed;Education provided;Falls prevention discussed    MEDICARE RISK AT HOME:   TIMED UP AND GO:  Was the test performed?  No    Cognitive Function:    02/19/2017   11:07 AM  MMSE - Mini Mental State Exam  Orientation to time 5  Orientation to Place 5  Registration 3  Attention/ Calculation 4  Recall 2  Language- name 2 objects 2  Language- repeat 1  Language- follow 3 step command 3  Language- read &  follow direction 1  Write a sentence 1  Copy design 1  Total score 28        09/14/2019   10:42 AM  6CIT Screen  What Year? 0 points  What month? 0 points  What time? 0 points  Count back from 20 0 points  Months in reverse 0 points  Repeat phrase 0 points  Total Score 0 points    Immunizations Immunization History  Administered Date(s) Administered   Fluad Quad(high Dose 65+) 01/21/2019, 02/01/2020, 02/05/2021, 02/23/2022   Influenza Split 02/18/2011, 02/18/2012   Influenza Whole 03/02/2007, 02/03/2008, 02/08/2009, 02/25/2010   Influenza, High Dose Seasonal PF 02/14/2013, 02/05/2016, 02/03/2017, 02/15/2018   Influenza,inj,Quad PF,6+ Mos 02/20/2014, 02/11/2015   Influenza-Unspecified 02/02/2016   Moderna Covid-19 Vaccine Bivalent Booster 80yrs & up 04/15/2021   PFIZER(Purple Top)SARS-COV-2 Vaccination 07/15/2019, 08/09/2019, 04/04/2020, 11/21/2020   Pfizer Covid-19 Vaccine Bivalent Booster 94yrs & up 03/11/2022   Pneumococcal Conjugate-13 02/12/2015   Pneumococcal Polysaccharide-23 02/19/2017   Td 03/03/2010   Zoster Recombinat (Shingrix) 02/28/2020, 07/16/2020   Zoster, Live 11/02/2012    TDAP status: Up to date  Flu Vaccine status: Up to date  Pneumococcal vaccine status: Up to date  Covid-19 vaccine status: Completed vaccines  Qualifies for Shingles Vaccine? Yes   Zostavax completed Yes   Shingrix Completed?: Yes  Screening Tests Health Maintenance  Topic Date Due   DTaP/Tdap/Td (2 - Tdap) 03/03/2020   COVID-19 Vaccine (7 - 2023-24 season) 05/06/2022   FOOT EXAM  10/24/2022   DEXA SCAN  05/06/2023 (Originally 07/02/2017)   HEMOGLOBIN A1C  11/03/2022   INFLUENZA VACCINE  12/03/2022   Diabetic kidney evaluation - Urine ACR  05/06/2023  OPHTHALMOLOGY EXAM  06/03/2023   Diabetic kidney evaluation - eGFR measurement  07/13/2023   Medicare Annual Wellness (AWV)  10/26/2023   Pneumonia Vaccine 35+ Years Knight  Completed   Hepatitis C Screening  Completed    Zoster Vaccines- Shingrix  Completed   HPV VACCINES  Aged Out   Colonoscopy  Discontinued    Health Maintenance  Health Maintenance Due  Topic Date Due   DTaP/Tdap/Td (2 - Tdap) 03/03/2020   COVID-19 Vaccine (7 - 2023-24 season) 05/06/2022   FOOT EXAM  10/24/2022    Colorectal cancer screening: No longer required.   Mammogram status: Completed 2023. Repeat every year  Bone Density status: Ordered DECLINED . Pt provided with contact info and advised to call to schedule appt.  Lung Cancer Screening: (Low Dose CT Chest recommended if Age 83-80 years, 20 pack-year currently smoking OR have quit w/in 15years.) does not qualify.   Lung Cancer Screening Referral: NA  Additional Screening:  Hepatitis C Screening: does qualify; Completed YES  Vision Screening: Recommended annual ophthalmology exams for early detection of glaucoma and other disorders of the eye. Is the patient up to date with their annual eye exam?  Yes  Who is the provider or what is the name of the office in which the patient attends annual eye exams? [facility] If pt is not established with a provider, would they like to be referred to a provider to establish care?  ESTABLISHED  .   Dental Screening: Recommended annual dental exams for proper oral hygiene  Diabetic Foot Exam: Diabetic Foot Exam: Completed NOT NEEDED   Community Resource Referral / Chronic Care Management: CRR required this visit?  No   CCM required this visit?  No     Plan:     I have personally reviewed and noted the following in the patient's chart:   Medical and social history Use of alcohol, tobacco or illicit drugs  Current medications and supplements including opioid prescriptions. Patient is not currently taking opioid prescriptions. Functional ability and status Nutritional status Physical activity Advanced directives List of other physicians Hospitalizations, surgeries, and ER visits in previous 12  months Vitals Screenings to include cognitive, depression, and falls Referrals and appointments  In addition, I have reviewed and discussed with patient certain preventive protocols, quality metrics, and best practice recommendations. A written personalized care plan for preventive services as well as general preventive health recommendations were provided to patient.     Delana Meyer   10/26/2022   After Visit Summary: (Declined) Due to this being a telephonic visit, with patients personalized plan was offered to patient but patient Declined AVS at this time   Nurse Notes:

## 2022-10-26 NOTE — Patient Instructions (Signed)

## 2022-10-28 DIAGNOSIS — K080 Exfoliation of teeth due to systemic causes: Secondary | ICD-10-CM | POA: Diagnosis not present

## 2022-10-29 NOTE — Progress Notes (Signed)
Subjective:   Sharon Knight is a 77 y.o. female who presents for Medicare Annual (Subsequent) preventive examination.  Visit Complete: Virtual  I connected with  [name] on 10/26/2022 by a audio enabled telemedicine application and verified that I am speaking with the correct person using two identifiers.  Patient Location: Home  Provider Location: Home Office  I discussed the limitations of evaluation and management by telemedicine. The patient expressed understanding and agreed to proceed.  Patient Medicare AWV questionnaire was completed by the patient on 10/26/2022; I have confirmed that all information answered by patient is correct and no changes since this date.      Objective:    Today's Vitals   There is no height or weight on file to calculate BMI.     10/26/2022   11:08 AM 11/11/2021    2:22 PM 10/30/2020   10:20 AM 09/14/2019   10:27 AM 05/24/2018   11:49 AM 12/10/2017    4:26 PM 12/10/2017    1:48 PM  Advanced Directives  Does Patient Have a Medical Advance Directive? Yes No No No No No No  Does patient want to make changes to medical advance directive? Yes (ED - Information included in AVS)        Would patient like information on creating a medical advance directive? Yes (MAU/Ambulatory/Procedural Areas - Information given) No - Patient declined No - Patient declined No - Patient declined Yes (MAU/Ambulatory/Procedural Areas - Information given) No - Patient declined     Current Medications (verified) Outpatient Encounter Medications as of 10/26/2022  Medication Sig   acetaminophen (TYLENOL) 650 MG CR tablet Take 650 mg by mouth every 8 (eight) hours as needed for pain.   apixaban (ELIQUIS) 5 MG TABS tablet TAKE ONE TABLET BY MOUTH TWICE DAILY   atorvastatin (LIPITOR) 10 MG tablet Take 1 tablet (10 mg total) by mouth daily.   Cholecalciferol (VITAMIN D3 PO) Take 1,000 Int'l Units/day by mouth.   diltiazem (CARDIZEM CD) 180 MG 24 hr capsule Take 1 capsule  (180 mg total) by mouth daily.   Docusate Sodium (STOOL SOFTENER LAXATIVE PO) Take 3-4 capsules by mouth daily.   flecainide (TAMBOCOR) 100 MG tablet TAKE ONE TABLET BY MOUTH TWICE DAILY   hydrochlorothiazide (HYDRODIURIL) 25 MG tablet TAKE ONE TABLET BY MOUTH ONCE DAILY   ketorolac (ACULAR) 0.5 % ophthalmic solution 1 drop 3 (three) times daily.   Misc Natural Products (TART CHERRY ADVANCED PO) Take 2 capsules by mouth 2 (two) times daily.   olmesartan (BENICAR) 5 MG tablet Take 1 tablet (5 mg total) by mouth daily.   No facility-administered encounter medications on file as of 10/26/2022.    Allergies (verified) Ibuprofen   History: Past Medical History:  Diagnosis Date   Arthritis    Bilateral cataracts    Dr Dagoberto Ligas   Exertional dyspnea 12/19/2020   Hyperlipidemia    Hypertension    Past Surgical History:  Procedure Laterality Date   COLONOSCOPY     Dr Russella Dar   NEPHRECTOMY     right; for cyst on kidney   OVARIAN CYST REMOVAL     Family History  Adopted: Yes  Problem Relation Age of Onset   Colon cancer Neg Hx    Stomach cancer Neg Hx    Breast cancer Neg Hx    Social History   Socioeconomic History   Marital status: Married    Spouse name: Not on file   Number of children: Not on file  Years of education: Not on file   Highest education level: Not on file  Occupational History   Not on file  Tobacco Use   Smoking status: Former    Types: Cigarettes    Quit date: 08/27/1984    Years since quitting: 38.1   Smokeless tobacco: Never   Tobacco comments:    smoked age [age], up to 1.5-2 ppd  Vaping Use   Vaping Use: Never used  Substance and Sexual Activity   Alcohol use: Yes    Alcohol/week: 3.0 standard drinks of alcohol    Types: 3 Cans of beer per week    Comment: 3 x weekly   Drug use: No   Sexual activity: Not on file  Other Topics Concern   Not on file  Social History Narrative   Exercise: none   Social Determinants of Health   Financial  Resource Strain: Low Risk  (10/26/2022)   Overall Financial Resource Strain (CARDIA)    Difficulty of Paying Living Expenses: Not hard at all  Food Insecurity: No Food Insecurity (10/26/2022)   Hunger Vital Sign    Worried About Running Out of Food in the Last Year: Never true    Ran Out of Food in the Last Year: Never true  Transportation Needs: No Transportation Needs (10/26/2022)   PRAPARE - Administrator, Civil Service (Medical): No    Lack of Transportation (Non-Medical): No  Physical Activity: Insufficiently Active (10/26/2022)   Exercise Vital Sign    Days of Exercise per Week: 3 days    Minutes of Exercise per Session: 30 min  Stress: No Stress Concern Present (10/26/2022)   [employer] of Occupational Health - Occupational Stress Questionnaire    Feeling of Stress : Not at all  Social Connections: Socially Integrated (10/26/2022)   Social Connection and Isolation Panel [NHANES]    Frequency of Communication with Friends and Family: More than three times a week    Frequency of Social Gatherings with Friends and Family: More than three times a week    Attends Religious Services: More than 4 times per year    Active Member of [employer] or Organizations: Yes    Attends Engineer, structural: More than 4 times per year    Marital Status: Married    Tobacco Counseling Counseling given: Not Answered Tobacco comments: smoked age [age], up to 1.5-2 ppd   Clinical Intake:  Pre-visit preparation completed: Yes  Pain : No/denies pain     Diabetes: No  How often do you need to have someone help you when you read instructions, pamphlets, or other written materials from your doctor or pharmacy?: 1 - Never What is the last grade level you completed in school?: HIGH SCHOOL  Interpreter Needed?: No      Activities of Daily Living    10/26/2022   11:00 AM 11/11/2021    2:26 PM  In your present state of health, do you have any difficulty performing the  following activities:  Hearing? 1 1  Vision? 0 0  Difficulty concentrating or making decisions? 0 1  Walking or climbing stairs? 1 0  Dressing or bathing? 0 0  Doing errands, shopping? 0 0  Preparing Food and eating ? N N  Using the Toilet? N N  In the past six months, have you accidently leaked urine? N Y  Do you have problems with loss of bowel control? N N  Managing your Medications? N N  Managing your Finances? N N  Housekeeping or managing your Housekeeping? N N    Patient Care Team: Pincus Sanes, MD as PCP - General (Internal Medicine) Chilton Si, MD as PCP - Cardiology (Cardiology) Regan Lemming, MD as PCP - Electrophysiology (Cardiology) Carmela Rima, MD as Consulting Physician (Ophthalmology) [facility], [facility] (Ophthalmology) Erlene Quan, Vinnie Level, [facility] (Inactive) (Pharmacist) Arita Miss, MD as Consulting Physician (Nephrology)  Indicate any recent Medical Services you may have received from other than [facility] providers in the past year (date may be approximate).     Assessment:   This is a routine wellness examination for [place].  Hearing/Vision screen No results found.  Dietary issues and exercise activities discussed:     Goals Addressed             This Visit's Progress    Increase physical activity        Depression Screen    10/26/2022   11:06 AM 05/05/2022    1:10 PM 11/11/2021    2:25 PM 10/30/2020   10:23 AM 10/16/2020   10:29 AM 09/14/2019   10:45 AM 03/17/2019   10:59 AM  PHQ 2/9 Scores  PHQ - 2 Score 0 0 0 0 0 1 0  PHQ- 9 Score 0 1    2     Fall Risk    10/26/2022   11:07 AM 05/05/2022    1:06 PM 11/11/2021    2:24 PM 10/30/2020   10:22 AM 09/16/2020    2:14 PM  Fall Risk   Falls in the past year? 0 0 0 0 0  Number falls in past yr: 0 0 0 0 0  Injury with Fall? 0 0 0 0 0  Risk for fall due to : No Fall Risks No Fall Risks No Fall Risks No Fall Risks No Fall Risks  Follow up Falls evaluation completed  Falls evaluation completed Falls evaluation completed Falls evaluation completed Falls evaluation completed    MEDICARE RISK AT HOME:   TIMED UP AND GO:  Was the test performed?  No    Cognitive Function:    02/19/2017   11:07 AM  MMSE - Mini Mental State Exam  Orientation to time 5  Orientation to Place 5  Registration 3  Attention/ Calculation 4  Recall 2  Language- name 2 objects 2  Language- repeat 1  Language- follow 3 step command 3  Language- read & follow direction 1  Write a sentence 1  Copy design 1  Total score 28        10/26/2022   11:08 AM 09/14/2019   10:42 AM  6CIT Screen  What Year? 0 points 0 points  What month? 0 points 0 points  What time? 0 points 0 points  Count back from 20 0 points 0 points  Months in reverse 0 points 0 points  Repeat phrase 4 points 0 points  Total Score 4 points 0 points    Immunizations Immunization History  Administered Date(s) Administered   Fluad Quad(high Dose 65+) 01/21/2019, 02/01/2020, 02/05/2021, 02/23/2022   Influenza Split 02/18/2011, 02/18/2012   Influenza Whole 03/02/2007, 02/03/2008, 02/08/2009, 02/25/2010   Influenza, High Dose Seasonal PF 02/14/2013, 02/05/2016, 02/03/2017, 02/15/2018   Influenza,inj,Quad PF,6+ Mos 02/20/2014, 02/11/2015   Influenza-Unspecified 02/02/2016   Moderna Covid-19 Vaccine Bivalent Booster 59yrs & up 04/15/2021   PFIZER(Purple Top)SARS-COV-2 Vaccination 07/15/2019, 08/09/2019, 04/04/2020, 11/21/2020   Pfizer Covid-19 Vaccine Bivalent Booster 58yrs & up 03/11/2022   Pneumococcal Conjugate-13 02/12/2015   Pneumococcal Polysaccharide-23 02/19/2017  Td 03/03/2010   Zoster Recombinat (Shingrix) 02/28/2020, 07/16/2020   Zoster, Live 11/02/2012    TDAP status: Up to date  Flu Vaccine status: Up to date  Pneumococcal vaccine status: Up to date  Covid-19 vaccine status: Completed vaccines  Qualifies for Shingles Vaccine? Yes   Zostavax completed Yes   Shingrix Completed?:  Yes  Screening Tests Health Maintenance  Topic Date Due   DTaP/Tdap/Td (2 - Tdap) 03/03/2020   COVID-19 Vaccine (7 - 2023-24 season) 05/06/2022   FOOT EXAM  10/24/2022   DEXA SCAN  05/06/2023 (Originally 07/02/2017)   HEMOGLOBIN A1C  11/03/2022   INFLUENZA VACCINE  12/03/2022   Diabetic kidney evaluation - Urine ACR  05/06/2023   OPHTHALMOLOGY EXAM  06/03/2023   Diabetic kidney evaluation - eGFR measurement  07/13/2023   Medicare Annual Wellness (AWV)  10/26/2023   Pneumonia Vaccine 41+ Years old  Completed   Hepatitis C Screening  Completed   Zoster Vaccines- Shingrix  Completed   HPV VACCINES  Aged Out   Colonoscopy  Discontinued    Health Maintenance  Health Maintenance Due  Topic Date Due   DTaP/Tdap/Td (2 - Tdap) 03/03/2020   COVID-19 Vaccine (7 - 2023-24 season) 05/06/2022   FOOT EXAM  10/24/2022    Colorectal cancer screening: No longer required.   Mammogram status: Completed 2023. Repeat every year  Bone Density status: Ordered DECLINED . Pt provided with contact info and advised to call to schedule appt.  Lung Cancer Screening: (Low Dose CT Chest recommended if Age 52-80 years, 20 pack-year currently smoking OR have quit w/in 15years.) does not qualify.   Lung Cancer Screening Referral: NA  Additional Screening:  Hepatitis C Screening: does qualify; Completed YES  Vision Screening: Recommended annual ophthalmology exams for early detection of glaucoma and other disorders of the eye. Is the patient up to date with their annual eye exam?  Yes  Who is the provider or what is the name of the office in which the patient attends annual eye exams? [place] [facility] If pt is not established with a provider, would they like to be referred to a provider to establish care?  ESTABLISHED  .   Dental Screening: Recommended annual dental exams for proper oral hygiene  Diabetic Foot Exam: Diabetic Foot Exam: Completed NOT NEEDED   Community Resource Referral / Chronic Care  Management: CRR required this visit?  No   CCM required this visit?  No     Plan:     I have personally reviewed and noted the following in the patient's chart:   Medical and social history Use of alcohol, tobacco or illicit drugs  Current medications and supplements including opioid prescriptions. Patient is not currently taking opioid prescriptions. Functional ability and status Nutritional status Physical activity Advanced directives List of other physicians Hospitalizations, surgeries, and ER visits in previous 12 months Vitals Screenings to include cognitive, depression, and falls Referrals and appointments  In addition, I have reviewed and discussed with patient certain preventive protocols, quality metrics, and best practice recommendations. A written personalized care plan for preventive services as well as general preventive health recommendations were provided to patient.     Delana Meyer, CMA  10/26/2022  After Visit Summary: (Declined) Due to this being a telephonic visit, with patients personalized plan was offered to patient but patient Declined AVS at this time   Nurse Notes: none

## 2022-11-01 ENCOUNTER — Encounter: Payer: Self-pay | Admitting: Internal Medicine

## 2022-11-01 NOTE — Patient Instructions (Addendum)
      Blood work was ordered.   The lab is on the first floor.    Medications changes include :       A referral was ordered and someone will call you to schedule an appointment.     Return in about 6 months (around 05/06/2023) for Physical Exam.  

## 2022-11-01 NOTE — Progress Notes (Signed)
Subjective:    Patient ID: Sharon Knight, female    DOB: [birth year], 77 y.o.   MRN: [id number]     HPI Sharon Knight is here for follow up of her chronic medical problems.    Medications and allergies reviewed with patient and updated if appropriate.  Current Outpatient Medications on File Prior to Visit  Medication Sig Dispense Refill  . acetaminophen (TYLENOL) 650 MG CR tablet Take 650 mg by mouth every 8 (eight) hours as needed for pain.    Marland Kitchen apixaban (ELIQUIS) 5 MG TABS tablet TAKE ONE TABLET BY MOUTH TWICE DAILY 60 tablet 5  . atorvastatin (LIPITOR) 10 MG tablet Take 1 tablet (10 mg total) by mouth daily. 90 tablet 3  . Cholecalciferol (VITAMIN D3 PO) Take 1,000 Int'l Units/day by mouth.    . diltiazem (CARDIZEM CD) 180 MG 24 hr capsule Take 1 capsule (180 mg total) by mouth daily. 90 capsule 3  . Docusate Sodium (STOOL SOFTENER LAXATIVE PO) Take 3-4 capsules by mouth daily.    . hydrochlorothiazide (HYDRODIURIL) 25 MG tablet TAKE ONE TABLET BY MOUTH ONCE DAILY 90 tablet 2  . ketorolac (ACULAR) 0.5 % ophthalmic solution 1 drop 3 (three) times daily.    . Misc Natural Products (TART CHERRY ADVANCED PO) Take 2 capsules by mouth 2 (two) times daily.     No current facility-administered medications on file prior to visit.     Review of Systems     Objective:  There were no vitals filed for this visit. BP Readings from Last 3 Encounters:  12/01/22 126/60  07/23/22 122/78  05/05/22 130/60   Wt Readings from Last 3 Encounters:  12/01/22 170 lb (77.1 kg)  07/23/22 173 lb (78.5 kg)  05/05/22 170 lb (77.1 kg)   There is no height or weight on file to calculate BMI.    Physical Exam     Lab Results  Component Value Date   WBC 9.5 12/01/2022   HGB 13.7 12/01/2022   HCT 41.2 12/01/2022   PLT 346.0 12/01/2022   GLUCOSE 118 (H) 12/01/2022   CHOL 174 12/01/2022   TRIG 156.0 (H) 12/01/2022   HDL 61.50 12/01/2022   LDLCALC 82 12/01/2022   ALT 15 12/01/2022   AST  19 12/01/2022   NA 137 12/01/2022   K 3.8 12/01/2022   CL 101 12/01/2022   CREATININE 1.26 (H) 12/01/2022   BUN 26 (H) 12/01/2022   CO2 27 12/01/2022   TSH 2.35 05/05/2022   HGBA1C 7.0 (H) 12/01/2022   MICROALBUR <0.7 05/05/2022     Assessment & Plan:    See Problem List for Assessment and Plan of chronic medical problems.    This encounter was created in error - please disregard.

## 2022-11-03 ENCOUNTER — Encounter: Payer: BLUE CROSS/BLUE SHIELD | Admitting: Internal Medicine

## 2022-11-03 DIAGNOSIS — E119 Type 2 diabetes mellitus without complications: Secondary | ICD-10-CM

## 2022-11-03 DIAGNOSIS — N1831 Chronic kidney disease, stage 3a: Secondary | ICD-10-CM

## 2022-11-03 DIAGNOSIS — E559 Vitamin D deficiency, unspecified: Secondary | ICD-10-CM

## 2022-11-03 DIAGNOSIS — E782 Mixed hyperlipidemia: Secondary | ICD-10-CM

## 2022-11-03 DIAGNOSIS — Z8739 Personal history of other diseases of the musculoskeletal system and connective tissue: Secondary | ICD-10-CM

## 2022-11-03 DIAGNOSIS — I480 Paroxysmal atrial fibrillation: Secondary | ICD-10-CM

## 2022-11-03 DIAGNOSIS — I10 Essential (primary) hypertension: Secondary | ICD-10-CM

## 2022-11-30 ENCOUNTER — Encounter: Payer: Self-pay | Admitting: Internal Medicine

## 2022-11-30 NOTE — Progress Notes (Unsigned)
      Subjective:    Patient ID: Sharon Knight, female    DOB: [birth year], 77 y.o.   MRN: [id number]     HPI Chaya is here for follow up of her chronic medical problems.    Medications and allergies reviewed with patient and updated if appropriate.  Current Outpatient Medications on File Prior to Visit  Medication Sig Dispense Refill   acetaminophen (TYLENOL) 650 MG CR tablet Take 650 mg by mouth every 8 (eight) hours as needed for pain.     apixaban (ELIQUIS) 5 MG TABS tablet TAKE ONE TABLET BY MOUTH TWICE DAILY 60 tablet 5   atorvastatin (LIPITOR) 10 MG tablet Take 1 tablet (10 mg total) by mouth daily. 90 tablet 3   Cholecalciferol (VITAMIN D3 PO) Take 1,000 Int'l Units/day by mouth.     diltiazem (CARDIZEM CD) 180 MG 24 hr capsule Take 1 capsule (180 mg total) by mouth daily. 90 capsule 3   Docusate Sodium (STOOL SOFTENER LAXATIVE PO) Take 3-4 capsules by mouth daily.     flecainide (TAMBOCOR) 100 MG tablet TAKE ONE TABLET BY MOUTH TWICE DAILY 180 tablet 3   hydrochlorothiazide (HYDRODIURIL) 25 MG tablet TAKE ONE TABLET BY MOUTH ONCE DAILY 90 tablet 2   ketorolac (ACULAR) 0.5 % ophthalmic solution 1 drop 3 (three) times daily.     Misc Natural Products (TART CHERRY ADVANCED PO) Take 2 capsules by mouth 2 (two) times daily.     olmesartan (BENICAR) 5 MG tablet Take 1 tablet (5 mg total) by mouth daily. 90 tablet 0   No current facility-administered medications on file prior to visit.     Review of Systems     Objective:  There were no vitals filed for this visit. BP Readings from Last 3 Encounters:  07/23/22 122/78  05/05/22 130/60  02/24/22 (!) 148/80   Wt Readings from Last 3 Encounters:  07/23/22 173 lb (78.5 kg)  05/05/22 170 lb (77.1 kg)  02/24/22 172 lb (78 kg)   There is no height or weight on file to calculate BMI.    Physical Exam     Lab Results  Component Value Date   WBC 9.8 05/05/2022   HGB 14.6 05/05/2022   HCT 42.4 05/05/2022   PLT  390.0 05/05/2022   GLUCOSE 111 (H) 05/05/2022   CHOL 192 05/05/2022   TRIG 151.0 (H) 05/05/2022   HDL 65.70 05/05/2022   LDLCALC 97 05/05/2022   ALT 20 05/05/2022   AST 23 05/05/2022   NA 140 05/05/2022   K 4.5 05/05/2022   CL 101 05/05/2022   CREATININE 1.24 (H) 05/05/2022   BUN 26 (H) 05/05/2022   CO2 30 05/05/2022   TSH 2.35 05/05/2022   HGBA1C 7.2 (H) 05/05/2022   MICROALBUR <0.7 05/05/2022     Assessment & Plan:    See Problem List for Assessment and Plan of chronic medical problems.

## 2022-11-30 NOTE — Patient Instructions (Addendum)
      Blood work was ordered.   The lab is on the first floor.    Medications changes include :   none      Return in about 6 months (around 06/03/2023) for Physical Exam.

## 2022-12-01 ENCOUNTER — Ambulatory Visit (INDEPENDENT_AMBULATORY_CARE_PROVIDER_SITE_OTHER): Payer: Medicare Other | Admitting: Internal Medicine

## 2022-12-01 VITALS — BP 126/60 | HR 65 | Temp 98.4°F | Ht 63.0 in | Wt 170.0 lb

## 2022-12-01 DIAGNOSIS — Z8739 Personal history of other diseases of the musculoskeletal system and connective tissue: Secondary | ICD-10-CM

## 2022-12-01 DIAGNOSIS — I700 Atherosclerosis of aorta: Secondary | ICD-10-CM | POA: Diagnosis not present

## 2022-12-01 DIAGNOSIS — I480 Paroxysmal atrial fibrillation: Secondary | ICD-10-CM | POA: Diagnosis not present

## 2022-12-01 DIAGNOSIS — N1831 Chronic kidney disease, stage 3a: Secondary | ICD-10-CM

## 2022-12-01 DIAGNOSIS — I10 Essential (primary) hypertension: Secondary | ICD-10-CM | POA: Diagnosis not present

## 2022-12-01 DIAGNOSIS — N1832 Chronic kidney disease, stage 3b: Secondary | ICD-10-CM

## 2022-12-01 DIAGNOSIS — E119 Type 2 diabetes mellitus without complications: Secondary | ICD-10-CM

## 2022-12-01 DIAGNOSIS — E1122 Type 2 diabetes mellitus with diabetic chronic kidney disease: Secondary | ICD-10-CM

## 2022-12-01 DIAGNOSIS — E782 Mixed hyperlipidemia: Secondary | ICD-10-CM | POA: Diagnosis not present

## 2022-12-01 LAB — COMPREHENSIVE METABOLIC PANEL
ALT: 15 U/L (ref 0–35)
AST: 19 U/L (ref 0–37)
Albumin: 4.4 g/dL (ref 3.5–5.2)
Alkaline Phosphatase: 90 U/L (ref 39–117)
BUN: 26 mg/dL — ABNORMAL HIGH (ref 6–23)
CO2: 27 mEq/L (ref 19–32)
Calcium: 10 mg/dL (ref 8.4–10.5)
Chloride: 101 mEq/L (ref 96–112)
Creatinine, Ser: 1.26 mg/dL — ABNORMAL HIGH (ref 0.40–1.20)
GFR: 40.92 mL/min — ABNORMAL LOW (ref >60.00)
Glucose, Bld: 118 mg/dL — ABNORMAL HIGH (ref 70–99)
Potassium: 3.8 mEq/L (ref 3.5–5.1)
Sodium: 137 mEq/L (ref 135–145)
Total Bilirubin: 0.5 mg/dL (ref 0.2–1.2)
Total Protein: 7.5 g/dL (ref 6.0–8.3)

## 2022-12-01 LAB — CBC WITH DIFFERENTIAL/PLATELET
Basophils Absolute: 0.1 10*3/uL (ref 0.0–0.1)
Basophils Relative: 1 % (ref 0.0–3.0)
Eosinophils Absolute: 0.2 10*3/uL (ref 0.0–0.7)
Eosinophils Relative: 2.6 % (ref 0.0–5.0)
HCT: 41.2 % (ref 36.0–46.0)
Hemoglobin: 13.7 g/dL (ref 12.0–15.0)
Lymphocytes Relative: 18.2 % (ref 12.0–46.0)
Lymphs Abs: 1.7 10*3/uL (ref 0.7–4.0)
MCHC: 33.2 g/dL (ref 30.0–36.0)
MCV: 92.2 fl (ref 78.0–100.0)
Monocytes Absolute: 0.9 10*3/uL (ref 0.1–1.0)
Monocytes Relative: 9.5 % (ref 3.0–12.0)
Neutro Abs: 6.5 10*3/uL (ref 1.4–7.7)
Neutrophils Relative %: 68.7 % (ref 43.0–77.0)
Platelets: 346 10*3/uL (ref 150.0–400.0)
RBC: 4.47 Mil/uL (ref 3.87–5.11)
RDW: 13 % (ref 11.5–15.5)
WBC: 9.5 10*3/uL (ref 4.0–10.5)

## 2022-12-01 LAB — LIPID PANEL
Cholesterol: 174 mg/dL (ref 0–200)
HDL: 61.5 mg/dL (ref >39.00)
LDL Cholesterol: 82 mg/dL (ref 0–99)
NonHDL: 112.96
Total CHOL/HDL Ratio: 3
Triglycerides: 156 mg/dL — ABNORMAL HIGH (ref 0.0–149.0)
VLDL: 31.2 mg/dL (ref 0.0–40.0)

## 2022-12-01 LAB — URIC ACID: Uric Acid, Serum: 7.9 mg/dL — ABNORMAL HIGH (ref 2.4–7.0)

## 2022-12-01 LAB — HEMOGLOBIN A1C: Hgb A1c MFr Bld: 7 % — ABNORMAL HIGH (ref 4.6–6.5)

## 2022-12-01 NOTE — Assessment & Plan Note (Signed)
Chronic Blood pressure well controlled CMP, cbc Continue diltiazem 180 mg daily, HCTZ 25 mg daily, Benicar 5 mg daily

## 2022-12-01 NOTE — Assessment & Plan Note (Signed)
Chronic   Lab Results  Component Value Date   HGBA1C 7.2 (H) 05/05/2022   Sugars not ideally controlled Check A1c She deferred medication  Continue lifestyle control  Discussed if A1c is > 7 she is at risk of damage to organs Stressed regular exercise, diabetic diet

## 2022-12-01 NOTE — Assessment & Plan Note (Signed)
Chronic Check Lipids Continue atorvastatin 10 mg daily Encouraged healthy diet and regular exercise

## 2022-12-01 NOTE — Assessment & Plan Note (Signed)
Chronic Following with cardiology In sinus rhythm today On diltiazem, Eliquis and flecainide CBC, CMP

## 2022-12-01 NOTE — Assessment & Plan Note (Signed)
History of gout Last uric acid level 7.4 Discussed starting allopurinol-was on in the past - deferred Taking tart cherry Check uric acid level today

## 2022-12-01 NOTE — Assessment & Plan Note (Signed)
Chronic Discussed starting Wilder Glade for sugars and protection of kidneys Stressed good blood pressure, sugar control Good water intake Avoid NSAIDs

## 2022-12-01 NOTE — Assessment & Plan Note (Signed)
Chronic Regular exercise and healthy diet encouraged Check lipid panel, cmp Continue atorvastatin 10 mg daily

## 2022-12-24 ENCOUNTER — Other Ambulatory Visit (HOSPITAL_BASED_OUTPATIENT_CLINIC_OR_DEPARTMENT_OTHER): Payer: Self-pay | Admitting: Family

## 2022-12-24 DIAGNOSIS — L821 Other seborrheic keratosis: Secondary | ICD-10-CM | POA: Diagnosis not present

## 2022-12-24 DIAGNOSIS — L814 Other melanin hyperpigmentation: Secondary | ICD-10-CM | POA: Diagnosis not present

## 2022-12-24 DIAGNOSIS — D225 Melanocytic nevi of trunk: Secondary | ICD-10-CM | POA: Diagnosis not present

## 2022-12-24 DIAGNOSIS — Z08 Encounter for follow-up examination after completed treatment for malignant neoplasm: Secondary | ICD-10-CM | POA: Diagnosis not present

## 2022-12-24 DIAGNOSIS — I10 Essential (primary) hypertension: Secondary | ICD-10-CM

## 2023-01-05 ENCOUNTER — Other Ambulatory Visit (HOSPITAL_BASED_OUTPATIENT_CLINIC_OR_DEPARTMENT_OTHER): Payer: Self-pay | Admitting: Cardiovascular Disease

## 2023-01-05 NOTE — Telephone Encounter (Signed)
Rx request sent to pharmacy.  

## 2023-01-19 ENCOUNTER — Other Ambulatory Visit: Payer: Self-pay | Admitting: Family

## 2023-01-19 DIAGNOSIS — D6859 Other primary thrombophilia: Secondary | ICD-10-CM

## 2023-01-19 NOTE — Telephone Encounter (Signed)
Prescription refill request for Eliquis received.  Indication: afib  Last office visit: Sharon Knight 07/23/2022 Scr:1.26, 12/01/2022 Age: 77 yo  Weight: 77.1 kg   Refill sent.

## 2023-02-01 ENCOUNTER — Telehealth (HOSPITAL_BASED_OUTPATIENT_CLINIC_OR_DEPARTMENT_OTHER): Payer: Self-pay | Admitting: *Deleted

## 2023-02-01 NOTE — Telephone Encounter (Signed)
Will forward to Pharm D for review  

## 2023-02-02 NOTE — Telephone Encounter (Signed)
Patient states she is returning a call. 

## 2023-02-02 NOTE — Telephone Encounter (Signed)
Tried to call patient, mailbox not accepting message at this time

## 2023-02-02 NOTE — Telephone Encounter (Signed)
Turmeric, bromelain, and quercetin can thin the blood and she already takes Eliquis, would not recommend using them. 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 Tart cherry is fine to try if she would like.  Generally do not recommend use of herbals/supplements though. The FDA does not require supplements to prove they are safe or effective before they are marketed. There is also no regulation for supplement purity. Consumers do not have a guarantee that the supplement they purchase will be effective, safe, or contain what's advertised on the label. They can also be quite expensive.

## 2023-02-02 NOTE — Telephone Encounter (Signed)
Advised patient, verbalized understanding  

## 2023-02-08 ENCOUNTER — Other Ambulatory Visit: Payer: Self-pay | Admitting: Internal Medicine

## 2023-02-08 DIAGNOSIS — Z1231 Encounter for screening mammogram for malignant neoplasm of breast: Secondary | ICD-10-CM

## 2023-03-22 ENCOUNTER — Ambulatory Visit
Admission: RE | Admit: 2023-03-22 | Discharge: 2023-03-22 | Disposition: A | Discharge status: Home / Self Care | Payer: Medicare Other | Source: Ambulatory Visit | Attending: Internal Medicine

## 2023-03-22 DIAGNOSIS — Z1231 Encounter for screening mammogram for malignant neoplasm of breast: Secondary | ICD-10-CM | POA: Diagnosis not present

## 2023-04-15 ENCOUNTER — Other Ambulatory Visit: Payer: Self-pay | Admitting: Cardiovascular Disease

## 2023-04-15 DIAGNOSIS — H3582 Retinal ischemia: Secondary | ICD-10-CM | POA: Diagnosis not present

## 2023-04-15 DIAGNOSIS — E113291 Type 2 diabetes mellitus with mild nonproliferative diabetic retinopathy without macular edema, right eye: Secondary | ICD-10-CM | POA: Diagnosis not present

## 2023-04-15 DIAGNOSIS — H59813 Chorioretinal scars after surgery for detachment, bilateral: Secondary | ICD-10-CM | POA: Diagnosis not present

## 2023-04-15 DIAGNOSIS — E113212 Type 2 diabetes mellitus with mild nonproliferative diabetic retinopathy with macular edema, left eye: Secondary | ICD-10-CM | POA: Diagnosis not present

## 2023-04-26 DIAGNOSIS — K080 Exfoliation of teeth due to systemic causes: Secondary | ICD-10-CM | POA: Diagnosis not present

## 2023-06-02 ENCOUNTER — Encounter: Payer: Self-pay | Admitting: Internal Medicine

## 2023-06-02 NOTE — Progress Notes (Signed)
      Subjective:    Patient ID: Sharon Knight, female    DOB: [birth year], 78 y.o.   MRN: [id number]     HPI Sharon Knight is here for follow up of her chronic medical problems.  Need sugars better controlled - need to protect kidney  Medications and allergies reviewed with patient and updated if appropriate.  Current Outpatient Medications on File Prior to Visit  Medication Sig Dispense Refill  . Docusate Sodium (STOOL SOFTENER LAXATIVE PO) Take 3-4 capsules by mouth daily.    . hydrochlorothiazide  (HYDRODIURIL ) 25 MG tablet TAKE ONE TABLET BY MOUTH ONCE DAILY 90 tablet 2  . Misc Natural Products (TART CHERRY ADVANCED PO) Take 2 capsules by mouth 2 (two) times daily.     No current facility-administered medications on file prior to visit.     Review of Systems     Objective:  There were no vitals filed for this visit. BP Readings from Last 3 Encounters:  09/16/23 120/74  08/04/23 122/72  07/21/23 128/78   Wt Readings from Last 3 Encounters:  09/16/23 154 lb (69.9 kg)  08/04/23 158 lb (71.7 kg)  06/09/23 166 lb (75.3 kg)   There is no height or weight on file to calculate BMI.    Physical Exam     Lab Results  Component Value Date   WBC 9.8 06/09/2023   HGB 13.8 06/09/2023   HCT 41.2 06/09/2023   PLT 375.0 06/09/2023   GLUCOSE 126 (H) 06/09/2023   CHOL 164 06/09/2023   TRIG 126.0 06/09/2023   HDL 68.90 06/09/2023   LDLCALC 70 06/09/2023   ALT 17 06/09/2023   AST 26 06/09/2023   NA 138 06/09/2023   K 3.6 06/09/2023   CL 99 06/09/2023   CREATININE 1.11 06/09/2023   BUN 25 (H) 06/09/2023   CO2 29 06/09/2023   TSH 2.35 05/05/2022   HGBA1C 7.4 (H) 06/09/2023   MICROALBUR <0.7 06/09/2023     Assessment & Plan:    See Problem List for Assessment and Plan of chronic medical problems.    This encounter was created in error - please disregard.

## 2023-06-02 NOTE — Patient Instructions (Addendum)
      Blood work was ordered.       Medications changes include :   None    A referral was ordered and someone will call you to schedule an appointment.     Return in about 6 months (around 12/01/2023) for Physical Exam.

## 2023-06-03 ENCOUNTER — Encounter: Payer: Medicare Other | Admitting: Internal Medicine

## 2023-06-03 DIAGNOSIS — N1831 Chronic kidney disease, stage 3a: Secondary | ICD-10-CM

## 2023-06-03 DIAGNOSIS — E782 Mixed hyperlipidemia: Secondary | ICD-10-CM

## 2023-06-03 DIAGNOSIS — I10 Essential (primary) hypertension: Secondary | ICD-10-CM

## 2023-06-03 DIAGNOSIS — Z8739 Personal history of other diseases of the musculoskeletal system and connective tissue: Secondary | ICD-10-CM

## 2023-06-03 DIAGNOSIS — E1122 Type 2 diabetes mellitus with diabetic chronic kidney disease: Secondary | ICD-10-CM

## 2023-06-03 DIAGNOSIS — I480 Paroxysmal atrial fibrillation: Secondary | ICD-10-CM

## 2023-06-03 NOTE — Assessment & Plan Note (Signed)
Chronic  Lab Results  Component Value Date   HGBA1C 7.2 (H) 05/05/2022   Sugars not ideally controlled Check A1c, urine microalbumin She deferred medication  Continue lifestyle control  Discussed if A1c is > 7 she is at risk of damage to organs Stressed regular exercise, diabetic diet

## 2023-06-03 NOTE — Assessment & Plan Note (Signed)
Chronic Regular exercise and healthy diet encouraged Check lipid panel, cmp Continue atorvastatin 10 mg daily

## 2023-06-03 NOTE — Assessment & Plan Note (Signed)
Chronic CBC, CMP Discussed starting Farxiga for sugars and protection of kidneys-she deferred Stressed good blood pressure, sugar control Good water intake Avoid NSAIDs

## 2023-06-03 NOTE — Assessment & Plan Note (Signed)
Chronic Following with cardiology On diltiazem, Eliquis and flecainide CBC, CMP

## 2023-06-03 NOTE — Assessment & Plan Note (Signed)
Chronic Blood pressure well controlled CMP, cbc Continue diltiazem 180 mg daily, HCTZ 25 mg daily, Benicar 5 mg daily

## 2023-06-03 NOTE — Assessment & Plan Note (Signed)
History of gout Denies gout flares since her last visit Lab Results  Component Value Date   LABURIC 7.9 (H) 12/01/2022   Discussed starting allopurinol-was on in the past - deferred Taking tart cherry Check uric acid level today

## 2023-06-08 NOTE — Patient Instructions (Addendum)
      Blood work was ordered.       Medications changes include :   None      Return in about 6 months (around 12/07/2023) for Physical Exam.

## 2023-06-08 NOTE — Progress Notes (Signed)
 Subjective:    Patient ID: Sharon Knight, female    DOB: 18-Apr-1946, 78 y.o.   MRN: [id number]     HPI Tinea is here for follow up of her chronic medical problems.   Not sleeping - neighbors very loud at times, gets RLS - tries stretching.  Tries to go to bed at the same time.  She does not want to take anything for it.   Working on eating more healthy and has lost some weight.    Low stamina.  Can only a little bit before needing to rest.    Medications and allergies reviewed with patient and updated if appropriate.  Current Outpatient Medications on File Prior to Visit  Medication Sig Dispense Refill   acetaminophen  (TYLENOL ) 650 MG CR tablet Take 650 mg by mouth every 8 (eight) hours as needed for pain.     atorvastatin  (LIPITOR) 10 MG tablet Take 1 tablet (10 mg total) by mouth daily. 90 tablet 3   diltiazem  (CARDIZEM  CD) 180 MG 24 hr capsule Take 1 capsule (180 mg total) by mouth daily. 90 capsule 3   Docusate Sodium (STOOL SOFTENER LAXATIVE PO) Take 3-4 capsules by mouth daily.     ELIQUIS  5 MG TABS tablet TAKE ONE TABLET BY MOUTH TWICE DAILY 60 tablet 5   flecainide  (TAMBOCOR ) 100 MG tablet TAKE ONE TABLET BY MOUTH TWICE DAILY 180 tablet 1   hydrochlorothiazide  (HYDRODIURIL ) 25 MG tablet TAKE ONE TABLET BY MOUTH ONCE DAILY 90 tablet 2   Misc Natural Products (TART CHERRY ADVANCED PO) Take 2 capsules by mouth 2 (two) times daily.     olmesartan  (BENICAR ) 5 MG tablet Take 1 tablet (5 mg total) by mouth daily. 90 tablet 1   No current facility-administered medications on file prior to visit.     Review of Systems  Constitutional:  Negative for fever.  Respiratory:  Negative for cough, shortness of breath and wheezing.   Cardiovascular:  Negative for chest pain, palpitations and leg swelling.  Neurological:  Negative for light-headedness (occ - if bend over too far) and headaches.       Objective:   Vitals:   06/09/23 1323  BP: 130/68  Pulse: 62  Temp:  98 F (36.7 C)  SpO2: 98%   BP Readings from Last 3 Encounters:  06/09/23 130/68  12/01/22 126/60  07/23/22 122/78   Wt Readings from Last 3 Encounters:  06/09/23 166 lb (75.3 kg)  12/01/22 170 lb (77.1 kg)  07/23/22 173 lb (78.5 kg)   Body mass index is 29.41 kg/m.    Physical Exam Constitutional:      General: She is not in acute distress.    Appearance: Normal appearance.  HENT:     Head: Normocephalic and atraumatic.  Eyes:     Conjunctiva/sclera: Conjunctivae normal.  Cardiovascular:     Rate and Rhythm: Normal rate and regular rhythm.     Heart sounds: Murmur (2/6 systolic) heard.  Pulmonary:     Effort: Pulmonary effort is normal. No respiratory distress.     Breath sounds: Normal breath sounds. No wheezing.  Musculoskeletal:     Cervical back: Neck supple.     Right lower leg: No edema.     Left lower leg: No edema.  Lymphadenopathy:     Cervical: No cervical adenopathy.  Skin:    General: Skin is warm and dry.     Findings: No rash.  Neurological:     Mental Status: She  is alert. Mental status is at baseline.  Psychiatric:        Mood and Affect: Mood normal.        Behavior: Behavior normal.        Lab Results  Component Value Date   WBC 9.5 12/01/2022   HGB 13.7 12/01/2022   HCT 41.2 12/01/2022   PLT 346.0 12/01/2022   GLUCOSE 118 (H) 12/01/2022   CHOL 174 12/01/2022   TRIG 156.0 (H) 12/01/2022   HDL 61.50 12/01/2022   LDLCALC 82 12/01/2022   ALT 15 12/01/2022   AST 19 12/01/2022   NA 137 12/01/2022   K 3.8 12/01/2022   CL 101 12/01/2022   CREATININE 1.26 (H) 12/01/2022   BUN 26 (H) 12/01/2022   CO2 27 12/01/2022   TSH 2.35 05/05/2022   HGBA1C 7.0 (H) 12/01/2022   MICROALBUR <0.7 05/05/2022     Assessment & Plan:    See Problem List for Assessment and Plan of chronic medical problems.

## 2023-06-09 ENCOUNTER — Encounter: Payer: Self-pay | Admitting: Internal Medicine

## 2023-06-09 ENCOUNTER — Ambulatory Visit (INDEPENDENT_AMBULATORY_CARE_PROVIDER_SITE_OTHER): Payer: Medicare Other | Admitting: Internal Medicine

## 2023-06-09 VITALS — BP 130/68 | HR 62 | Temp 98.0°F | Ht 63.0 in | Wt 166.0 lb

## 2023-06-09 DIAGNOSIS — N1832 Chronic kidney disease, stage 3b: Secondary | ICD-10-CM

## 2023-06-09 DIAGNOSIS — G2581 Restless legs syndrome: Secondary | ICD-10-CM

## 2023-06-09 DIAGNOSIS — E782 Mixed hyperlipidemia: Secondary | ICD-10-CM

## 2023-06-09 DIAGNOSIS — E1122 Type 2 diabetes mellitus with diabetic chronic kidney disease: Secondary | ICD-10-CM

## 2023-06-09 DIAGNOSIS — I10 Essential (primary) hypertension: Secondary | ICD-10-CM | POA: Diagnosis not present

## 2023-06-09 DIAGNOSIS — I480 Paroxysmal atrial fibrillation: Secondary | ICD-10-CM | POA: Diagnosis not present

## 2023-06-09 DIAGNOSIS — N1831 Chronic kidney disease, stage 3a: Secondary | ICD-10-CM

## 2023-06-09 DIAGNOSIS — R5383 Other fatigue: Secondary | ICD-10-CM | POA: Insufficient documentation

## 2023-06-09 DIAGNOSIS — E559 Vitamin D deficiency, unspecified: Secondary | ICD-10-CM

## 2023-06-09 DIAGNOSIS — Z8739 Personal history of other diseases of the musculoskeletal system and connective tissue: Secondary | ICD-10-CM | POA: Diagnosis not present

## 2023-06-09 DIAGNOSIS — G479 Sleep disorder, unspecified: Secondary | ICD-10-CM

## 2023-06-09 LAB — LIPID PANEL
Cholesterol: 164 mg/dL (ref 0–200)
HDL: 68.9 mg/dL (ref >39.00)
LDL Cholesterol: 70 mg/dL (ref 0–99)
NonHDL: 95.49
Total CHOL/HDL Ratio: 2
Triglycerides: 126 mg/dL (ref 0.0–149.0)
VLDL: 25.2 mg/dL (ref 0.0–40.0)

## 2023-06-09 LAB — CBC WITH DIFFERENTIAL/PLATELET
Basophils Absolute: 0.1 10*3/uL (ref 0.0–0.1)
Basophils Relative: 0.8 % (ref 0.0–3.0)
Eosinophils Absolute: 0.2 10*3/uL (ref 0.0–0.7)
Eosinophils Relative: 1.8 % (ref 0.0–5.0)
HCT: 41.2 % (ref 36.0–46.0)
Hemoglobin: 13.8 g/dL (ref 12.0–15.0)
Lymphocytes Relative: 17.7 % (ref 12.0–46.0)
Lymphs Abs: 1.7 10*3/uL (ref 0.7–4.0)
MCHC: 33.4 g/dL (ref 30.0–36.0)
MCV: 91.6 fL (ref 78.0–100.0)
Monocytes Absolute: 0.9 10*3/uL (ref 0.1–1.0)
Monocytes Relative: 9 % (ref 3.0–12.0)
Neutro Abs: 6.9 10*3/uL (ref 1.4–7.7)
Neutrophils Relative %: 70.7 % (ref 43.0–77.0)
Platelets: 375 10*3/uL (ref 150.0–400.0)
RBC: 4.5 Mil/uL (ref 3.87–5.11)
RDW: 12.7 % (ref 11.5–15.5)
WBC: 9.8 10*3/uL (ref 4.0–10.5)

## 2023-06-09 LAB — COMPREHENSIVE METABOLIC PANEL
ALT: 17 U/L (ref 0–35)
AST: 26 U/L (ref 0–37)
Albumin: 4.4 g/dL (ref 3.5–5.2)
Alkaline Phosphatase: 82 U/L (ref 39–117)
BUN: 25 mg/dL — ABNORMAL HIGH (ref 6–23)
CO2: 29 meq/L (ref 19–32)
Calcium: 9.6 mg/dL (ref 8.4–10.5)
Chloride: 99 meq/L (ref 96–112)
Creatinine, Ser: 1.11 mg/dL (ref 0.40–1.20)
GFR: 47.47 mL/min — ABNORMAL LOW (ref >60.00)
Glucose, Bld: 126 mg/dL — ABNORMAL HIGH (ref 70–99)
Potassium: 3.6 meq/L (ref 3.5–5.1)
Sodium: 138 meq/L (ref 135–145)
Total Bilirubin: 0.5 mg/dL (ref 0.2–1.2)
Total Protein: 7.8 g/dL (ref 6.0–8.3)

## 2023-06-09 LAB — URIC ACID: Uric Acid, Serum: 7.5 mg/dL — ABNORMAL HIGH (ref 2.4–7.0)

## 2023-06-09 LAB — IBC PANEL
Iron: 84 ug/dL (ref 42–145)
Saturation Ratios: 25.4 % (ref 20.0–50.0)
TIBC: 330.4 ug/dL (ref 250.0–450.0)
Transferrin: 236 mg/dL (ref 212.0–360.0)

## 2023-06-09 LAB — FERRITIN: Ferritin: 63.8 ng/mL (ref 10.0–291.0)

## 2023-06-09 LAB — VITAMIN D 25 HYDROXY (VIT D DEFICIENCY, FRACTURES): VITD: 27.22 ng/mL — ABNORMAL LOW (ref 30.00–100.00)

## 2023-06-09 LAB — MICROALBUMIN / CREATININE URINE RATIO
Creatinine,U: 43.6 mg/dL
Microalb Creat Ratio: 1.6 mg/g (ref 0.0–30.0)
Microalb, Ur: <0.7 mg/dL (ref 0.0–1.9)

## 2023-06-09 LAB — HEMOGLOBIN A1C: Hgb A1c MFr Bld: 7.4 % — ABNORMAL HIGH (ref 4.6–6.5)

## 2023-06-09 NOTE — Assessment & Plan Note (Signed)
 Chronic Regular exercise and healthy diet encouraged Check lipid panel, cmp Continue atorvastatin 10 mg daily

## 2023-06-09 NOTE — Assessment & Plan Note (Signed)
 Chronic  Lab Results  Component Value Date   HGBA1C 7.2 (H) 05/05/2022   Sugars not ideally controlled Check A1c, urine microalbumin She deferred medication  Continue lifestyle control  Discussed if A1c is > 7 she is at risk of damage to organs Stressed regular exercise, diabetic diet

## 2023-06-09 NOTE — Assessment & Plan Note (Signed)
Chronic Not currently takig vitamin d daily Check vitamin d level

## 2023-06-09 NOTE — Assessment & Plan Note (Signed)
 Chronic Has low energy States low stamina-can only do a little bit when she needs to rest Stressed that she needs to increase her activity slowly in order to help build up her stamina Routine blood work today

## 2023-06-09 NOTE — Assessment & Plan Note (Signed)
 New Multifactorial She is having some restless leg problems when she is new -deferred medication.  Will check iron levels Neighbors next-door also very noisily and that is affecting her sleep Also gets up to go to the bathroom frequently as does her husband and they both wake each other up Does not want to take any medication Will try white noise machine to see if that helps

## 2023-06-09 NOTE — Assessment & Plan Note (Signed)
 History of gout Denies gout flares since her last visit Lab Results  Component Value Date   LABURIC 7.9 (H) 12/01/2022   Discussed starting allopurinol-was on in the past - deferred Taking tart cherry Check uric acid level today

## 2023-06-09 NOTE — Assessment & Plan Note (Signed)
 Chronic Following with cardiology On diltiazem, Eliquis and flecainide CBC, CMP

## 2023-06-09 NOTE — Assessment & Plan Note (Addendum)
 New Affecting her sleep Check iron panel Does not want to take any medication for this

## 2023-06-09 NOTE — Assessment & Plan Note (Signed)
 Chronic Blood pressure well controlled CMP, cbc Continue diltiazem 180 mg daily, HCTZ 25 mg daily, Benicar 5 mg daily

## 2023-06-09 NOTE — Assessment & Plan Note (Signed)
 Chronic CBC, CMP Discussed starting Farxiga for sugars and protection of kidneys-she deferred Stressed good blood pressure, sugar control Good water intake Avoid NSAIDs

## 2023-06-23 ENCOUNTER — Other Ambulatory Visit (HOSPITAL_BASED_OUTPATIENT_CLINIC_OR_DEPARTMENT_OTHER): Payer: Self-pay | Admitting: Family

## 2023-06-23 DIAGNOSIS — I10 Essential (primary) hypertension: Secondary | ICD-10-CM

## 2023-06-28 DIAGNOSIS — L821 Other seborrheic keratosis: Secondary | ICD-10-CM | POA: Diagnosis not present

## 2023-06-28 DIAGNOSIS — L84 Corns and callosities: Secondary | ICD-10-CM | POA: Diagnosis not present

## 2023-06-28 DIAGNOSIS — L814 Other melanin hyperpigmentation: Secondary | ICD-10-CM | POA: Diagnosis not present

## 2023-06-28 DIAGNOSIS — D225 Melanocytic nevi of trunk: Secondary | ICD-10-CM | POA: Diagnosis not present

## 2023-06-30 ENCOUNTER — Other Ambulatory Visit (HOSPITAL_BASED_OUTPATIENT_CLINIC_OR_DEPARTMENT_OTHER): Payer: Self-pay | Admitting: Cardiovascular Disease

## 2023-07-09 DIAGNOSIS — Z905 Acquired absence of kidney: Secondary | ICD-10-CM | POA: Diagnosis not present

## 2023-07-09 DIAGNOSIS — I129 Hypertensive chronic kidney disease with stage 1 through stage 4 chronic kidney disease, or unspecified chronic kidney disease: Secondary | ICD-10-CM | POA: Diagnosis not present

## 2023-07-09 DIAGNOSIS — I480 Paroxysmal atrial fibrillation: Secondary | ICD-10-CM | POA: Diagnosis not present

## 2023-07-09 DIAGNOSIS — E785 Hyperlipidemia, unspecified: Secondary | ICD-10-CM | POA: Diagnosis not present

## 2023-07-09 DIAGNOSIS — N1832 Chronic kidney disease, stage 3b: Secondary | ICD-10-CM | POA: Diagnosis not present

## 2023-07-14 LAB — LAB REPORT - SCANNED
Albumin, Urine POC: 8.8
Creatinine, POC: 139.1 mg/dL
EGFR: 55
Microalb Creat Ratio: 6

## 2023-07-16 ENCOUNTER — Other Ambulatory Visit: Payer: Self-pay | Admitting: Family

## 2023-07-16 DIAGNOSIS — D6859 Other primary thrombophilia: Secondary | ICD-10-CM

## 2023-07-16 NOTE — Telephone Encounter (Signed)
 Prescription refill request for Eliquis received. Indication:afib Last office visit:3/24 Scr:1.11  2/25 Age: 78 Weight:75.3  kg  Prescription refilled

## 2023-07-21 ENCOUNTER — Ambulatory Visit (INDEPENDENT_AMBULATORY_CARE_PROVIDER_SITE_OTHER)

## 2023-07-21 ENCOUNTER — Encounter: Payer: Self-pay | Admitting: Family Medicine

## 2023-07-21 ENCOUNTER — Other Ambulatory Visit: Payer: Self-pay

## 2023-07-21 ENCOUNTER — Ambulatory Visit: Admitting: Family Medicine

## 2023-07-21 VITALS — BP 128/78 | HR 60 | Ht 63.0 in

## 2023-07-21 DIAGNOSIS — M79604 Pain in right leg: Secondary | ICD-10-CM | POA: Diagnosis not present

## 2023-07-21 DIAGNOSIS — M1611 Unilateral primary osteoarthritis, right hip: Secondary | ICD-10-CM

## 2023-07-21 DIAGNOSIS — M47816 Spondylosis without myelopathy or radiculopathy, lumbar region: Secondary | ICD-10-CM | POA: Diagnosis not present

## 2023-07-21 DIAGNOSIS — M7061 Trochanteric bursitis, right hip: Secondary | ICD-10-CM | POA: Diagnosis not present

## 2023-07-21 DIAGNOSIS — M25551 Pain in right hip: Secondary | ICD-10-CM | POA: Diagnosis not present

## 2023-07-21 DIAGNOSIS — I700 Atherosclerosis of aorta: Secondary | ICD-10-CM | POA: Diagnosis not present

## 2023-07-21 NOTE — Patient Instructions (Addendum)
Thank you for coming in today.   Please get an Xray today before you leave   You received an injection today. Seek immediate medical attention if the joint becomes red, extremely painful, or is oozing fluid.

## 2023-07-21 NOTE — Progress Notes (Signed)
 I, Stevenson Clinch, CMA acting as a scribe for Clementeen Graham, MD.  Sharon Knight is a 78 y.o. female who presents to [facility] at [facility] today for R leg pain. Pt was previously seen by Dr. Denyse Amass in 2022-23 for L knee pain.  Today, pt c/o R leg pain x several months. Pt locates pain to lateral aspect of the hip. The hip will catch at times. Pain radiates into the anterior thigh, TTP. Also notes pain radiating into gluteal region and groin. Having some weakness in the R LE. Suffers from RLS. Minimal lower back pain.   Pain is located in the right anterior hip and thigh and right lateral hip.  Pain is worse with activity walking standing laying on her right side and hip flexion activities including putting her shoes and socks on.  Aggravates: TTP, ambulation Treatments tried: Tylenol cream provides some relief  Pertinent review of systems: No fevers or chills  Relevant historical information: Atrial fibrillation.  Diabetes.   Exam:  BP 128/78   Pulse 60   Ht 5\' 3"  (1.6 m)   SpO2 98%   BMI 29.41 kg/m  General: Well Developed, well nourished, and in no acute distress.   MSK: Right hip normal. Decreased range of motion.  Plan flexion and rotation. Hip abduction strength is diminished. Tender palpation greater trochanter bursa.    Lab and Radiology Results  Procedure: Real-time Ultrasound Guided Injection of right hip greater trochanter bursa Device: Philips Affiniti 50G/GE Logiq Images permanently stored and available for review in PACS Verbal informed consent obtained.  Discussed risks and benefits of procedure. Warned about infection, bleeding, hyperglycemia damage to structures among others. Patient expresses understanding and agreement Time-out conducted.   Noted no overlying erythema, induration, or other signs of local infection.   Skin prepped in a sterile fashion.   Local anesthesia: Topical Ethyl chloride.   With sterile technique and under  real time ultrasound guidance: 40 mg of Kenalog and 2 mL's of Marcaine injected into greater trochanter bursa. Fluid seen entering the bursa.   Completed without difficulty   Pain moderately immediately resolved suggesting accurate placement of the medication.   Advised to call if fevers/chills, erythema, induration, drainage, or persistent bleeding.   Images permanently stored and available for review in the ultrasound unit.  Impression: Technically successful ultrasound guided injection.    X-ray images lumbar spine and right hip obtained today personally and independently interpreted.  Lumbar spine: The level DDD worse at L2-L4.  No acute fractures are visible.  Aortic atherosclerosis is present.  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  No acute fractures are visible.  Await formal radiology review     Assessment and Plan: 78 y.o. female with a factorial right thigh hip and lateral hip pain.  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  It is possible that she may have lumbar radiculopathy at L2 or L3 as well.  Plan today to proceed to greater trochanter bursa injection.  Recheck in 2 weeks.  Consider interarticular right hip injection at that time if not improved enough.   PDMP not reviewed this encounter. Orders Placed This Encounter  Procedures   DG Lumbar Spine 2-3 Views    Standing Status:   Future    Number of Occurrences:   1    Expiration Date:   08/21/2023    Reason for Exam (SYMPTOM  OR DIAGNOSIS REQUIRED):   right  LE pain    Preferred imaging location?:   [facility]   DG HIP UNILAT W OR W/O PELVIS 2-3 VIEWS RIGHT    Standing Status:   Future    Number of Occurrences:   1    Expiration Date:   08/21/2023    Reason for Exam (SYMPTOM  OR DIAGNOSIS REQUIRED):   Right LE pain    Preferred imaging location?:   [facility]   [place] LIMITED JOINT SPACE STRUCTURES LOW RIGHT(NO  LINKED CHARGES)    Reason for Exam (SYMPTOM  OR DIAGNOSIS REQUIRED):   right hip pain    Preferred imaging location?:    [facility]   No orders of the defined types were placed in this encounter.    Discussed warning signs or symptoms. Please see discharge instructions. Patient expresses understanding.   The above documentation has been reviewed and is accurate and complete Clementeen Graham, M.D.

## 2023-08-02 ENCOUNTER — Encounter: Payer: Self-pay | Admitting: Internal Medicine

## 2023-08-02 DIAGNOSIS — E113291 Type 2 diabetes mellitus with mild nonproliferative diabetic retinopathy without macular edema, right eye: Secondary | ICD-10-CM | POA: Diagnosis not present

## 2023-08-02 DIAGNOSIS — Z961 Presence of intraocular lens: Secondary | ICD-10-CM | POA: Diagnosis not present

## 2023-08-02 DIAGNOSIS — H02054 Trichiasis without entropian left upper eyelid: Secondary | ICD-10-CM | POA: Diagnosis not present

## 2023-08-02 LAB — HM DIABETES EYE EXAM

## 2023-08-03 NOTE — Progress Notes (Unsigned)
   Rubin Payor, PhD, LAT, ATC acting as a scribe for Clementeen Graham, MD.  Sharon Knight is a 78 y.o. female who presents to [facility] at [facility] today for 2-wk f/u R leg pain. Pt was last seen by Dr. Denyse Amass on 07/21/23 and was given a R GT steroid injection.  Today, pt reports ***  Dx imaging: 07/21/23 R hip & L-spine XR  Pertinent review of systems: ***  Relevant historical information: ***   Exam:  There were no vitals taken for this visit. General: Well Developed, well nourished, and in no acute distress.   MSK: ***    Lab and Radiology Results No results found for this or any previous visit (from the past 72 hours). No results found.     Assessment and Plan: 78 y.o. female with ***   PDMP not reviewed this encounter. No orders of the defined types were placed in this encounter.  No orders of the defined types were placed in this encounter.    Discussed warning signs or symptoms. Please see discharge instructions. Patient expresses understanding.   ***

## 2023-08-04 ENCOUNTER — Encounter: Payer: Self-pay | Admitting: Family Medicine

## 2023-08-04 ENCOUNTER — Ambulatory Visit: Admitting: Family Medicine

## 2023-08-04 VITALS — BP 122/72 | HR 64 | Ht 63.0 in | Wt 158.0 lb

## 2023-08-04 DIAGNOSIS — M7061 Trochanteric bursitis, right hip: Secondary | ICD-10-CM

## 2023-08-04 DIAGNOSIS — M1611 Unilateral primary osteoarthritis, right hip: Secondary | ICD-10-CM

## 2023-08-04 DIAGNOSIS — M25551 Pain in right hip: Secondary | ICD-10-CM

## 2023-08-04 NOTE — Progress Notes (Signed)
 Low back x-ray does show multiple areas where the nerves could be pinched in your back.

## 2023-08-04 NOTE — Patient Instructions (Addendum)
 Thank you for coming in today.   I can do an injection into the hip joint if needed.   Recheck as needed for these issues.   If are not sure what to do always ok to ask.

## 2023-08-04 NOTE — Progress Notes (Signed)
 Just after you left the x-rays came back.  You do have arthritis in the right hip like we talked about.

## 2023-08-10 ENCOUNTER — Other Ambulatory Visit: Payer: Self-pay | Admitting: Family

## 2023-08-10 DIAGNOSIS — E782 Mixed hyperlipidemia: Secondary | ICD-10-CM

## 2023-08-16 ENCOUNTER — Other Ambulatory Visit (HOSPITAL_BASED_OUTPATIENT_CLINIC_OR_DEPARTMENT_OTHER): Payer: Self-pay | Admitting: Family

## 2023-08-16 DIAGNOSIS — I480 Paroxysmal atrial fibrillation: Secondary | ICD-10-CM

## 2023-09-07 ENCOUNTER — Other Ambulatory Visit: Payer: Self-pay | Admitting: Family

## 2023-09-07 DIAGNOSIS — E782 Mixed hyperlipidemia: Secondary | ICD-10-CM

## 2023-09-16 ENCOUNTER — Other Ambulatory Visit: Payer: Self-pay

## 2023-09-16 ENCOUNTER — Ambulatory Visit: Admitting: Family Medicine

## 2023-09-16 VITALS — BP 120/74 | HR 58 | Ht 63.0 in | Wt 154.0 lb

## 2023-09-16 DIAGNOSIS — M25551 Pain in right hip: Secondary | ICD-10-CM

## 2023-09-16 NOTE — Progress Notes (Signed)
 Sharon Muck, PhD, LAT, ATC acting as a scribe for Sharon Juniper, MD.  Sharon Knight is a 78 y.o. female who presents to [facility] at [facility] today for exacerbation of her R hip pain. Pt was last seen by Dr. Alease Hunter on 08/04/23. Last R GT steroid injection, 07/21/23  Today, pt reports R lateral hip pain has returned over the last couple weeks. Last week, she went to the grocery store, she felt like her legs were going to give out on her.   Dx imaging: 07/21/23 R hip & L-spine XR  Pertinent review of systems: No fevers or chills  Relevant historical information: Atrial fibrillation.  Diabetes.  Osteopenia.   Exam:  BP 120/74   Pulse (!) 58   Ht 5\' 3"  (1.6 m)   Wt 154 lb (69.9 kg)   SpO2 100%   BMI 27.28 kg/m  General: Well Developed, well nourished, and in no acute distress.   MSK: Right hip normal-appearing Tender palpation greater trochanter.  Decreased hip range of motion.    Lab and Radiology Results  Procedure: Real-time Ultrasound Guided Injection of right hip intra-articular femoral acetabular joint anterior approach Device: Philips Affiniti 50G/GE Logiq Images permanently stored and available for review in PACS Verbal informed consent obtained.  Discussed risks and benefits of procedure. Warned about infection, bleeding, hyperglycemia damage to structures among others. Patient expresses understanding and agreement Time-out conducted.   Noted no overlying erythema, induration, or other signs of local infection.   Skin prepped in a sterile fashion.   Local anesthesia: Topical Ethyl chloride.   With sterile technique and under real time ultrasound guidance: 40 mg of Kenalog  and 2 ml of Marcaine injected into hip joint. Fluid seen entering the joint capsule.   Completed without difficulty   Pain immediately resolved suggesting accurate placement of the medication.   Advised to call if fevers/chills, erythema, induration, drainage, or persistent  bleeding.   Images permanently stored and available for review in the ultrasound unit.  Impression: Technically successful ultrasound guided injection.    EXAM: DG HIP (WITH OR WITHOUT PELVIS) 2-3V RIGHT   COMPARISON:  None Available.   FINDINGS: No fracture or bone lesion.   Marked concentric right hip joint space narrowing. Mild subchondral sclerosis along the superomedial right femoral head. There are marginal osteophytes from the base of the superior right femoral head.   Left hip joint shows mild medial joint space narrowing, otherwise unremarkable.   SI joints and pubic symphysis are normally aligned.   Scattered arterial vascular calcifications. Soft tissues are otherwise unremarkable.   IMPRESSION: 1. No fracture or acute finding. 2. Marked right hip joint arthropathic changes.     Electronically Signed   By: Amanda Jungling M.D.   On: 08/04/2023 09:14 I, Sharon Knight, personally (independently) visualized and performed the interpretation of the images attached in this note.     Assessment and Plan: 78 y.o. 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  Previous greater trochanter injection provided about 2 months of relief.  Plan for trial of intra-articular injection today to see how much of her pain is coming from her hip joint how much of it is coming from the tendons on the side of her hip.  She had moderate benefit today.  If not good enough she will let me know and next step would be a hip MRI.   PDMP not reviewed this encounter. Orders Placed This Encounter  Procedures   [place]  LIMITED JOINT SPACE STRUCTURES LOW RIGHT(NO LINKED CHARGES)    Reason for Exam (SYMPTOM  OR DIAGNOSIS REQUIRED):   right hip pain    Preferred imaging location?:   [facility]   No orders of the defined types were placed in this encounter.    Discussed warning signs or symptoms. Please see discharge instructions. Patient  expresses understanding.   The above documentation has been reviewed and is accurate and complete Sharon Knight, M.D.

## 2023-09-16 NOTE — Patient Instructions (Addendum)
 Thank you for coming in today.   You received an injection today. Seek immediate medical attention if the joint becomes red, extremely painful, or is oozing fluid.   Let me know how your hip is feeling over the weekend  If not better, I will order a MRI

## 2023-09-20 ENCOUNTER — Other Ambulatory Visit (HOSPITAL_BASED_OUTPATIENT_CLINIC_OR_DEPARTMENT_OTHER): Payer: Self-pay | Admitting: Family

## 2023-09-20 ENCOUNTER — Telehealth: Payer: Self-pay | Admitting: Family Medicine

## 2023-09-20 DIAGNOSIS — I10 Essential (primary) hypertension: Secondary | ICD-10-CM

## 2023-09-20 NOTE — Telephone Encounter (Signed)
 Forwarding to Dr. Denyse Amass as Lorain Childes.

## 2023-09-20 NOTE — Telephone Encounter (Signed)
 Patient called to let Dr Alease Hunter know that she is feeling better. It took a few days to kick in but "so far, so good".  Just FYI.

## 2023-09-27 ENCOUNTER — Other Ambulatory Visit (HOSPITAL_BASED_OUTPATIENT_CLINIC_OR_DEPARTMENT_OTHER): Payer: Self-pay | Admitting: Cardiovascular Disease

## 2023-10-29 ENCOUNTER — Ambulatory Visit (INDEPENDENT_AMBULATORY_CARE_PROVIDER_SITE_OTHER): Payer: Medicare Other

## 2023-10-29 VITALS — Ht 63.0 in | Wt 154.0 lb

## 2023-10-29 DIAGNOSIS — Z0000 Encounter for general adult medical examination without abnormal findings: Secondary | ICD-10-CM | POA: Diagnosis not present

## 2023-10-29 DIAGNOSIS — H9193 Unspecified hearing loss, bilateral: Secondary | ICD-10-CM | POA: Diagnosis not present

## 2023-10-29 NOTE — Patient Instructions (Signed)
 Sharon Knight , Thank you for taking time out of your busy schedule to complete your Annual Wellness Visit with me. I enjoyed our conversation and look forward to speaking with you again next year. I, as well as your care team,  appreciate your ongoing commitment to your health goals. Please review the following plan we discussed and let me know if I can assist you in the future. Your Game plan/ To Do List    Referrals: If you haven't heard from the office you've been referred to, please reach out to them at the phone provided.  Referral to an Audiologist for a hearing exam Follow up Visits: Next Medicare AWV with our clinical staff: 10/30/2024   Have you seen your provider in the last 6 months (3 months if uncontrolled diabetes)? No Next Office Visit with your provider: 12/07/2023  Clinician Recommendations:  Aim for 30 minutes of exercise or brisk walking, 6-8 glasses of water, and 5 servings of fruits and vegetables each day. Educated and advised on getting the Tdap (Tetenus) vaccine in 2025 at local pharmacy.      This is a list of the screening recommended for you and due dates:  Health Maintenance  Topic Date Due   DTaP/Tdap/Td vaccine (2 - Tdap) 03/03/2020   Complete foot exam   10/24/2022   COVID-19 Vaccine (7 - 2024-25 season) 01/03/2023   DEXA scan (bone density measurement)  06/08/2024*   Flu Shot  12/03/2023   Hemoglobin A1C  12/07/2023   Yearly kidney function blood test for diabetes  07/13/2024   Yearly kidney health urinalysis for diabetes  07/13/2024   Eye exam for diabetics  08/01/2024   Medicare Annual Wellness Visit  10/28/2024   Pneumococcal Vaccine for age over 63  Completed   Hepatitis C Screening  Completed   Zoster (Shingles) Vaccine  Completed   Hepatitis B Vaccine  Aged Out   HPV Vaccine  Aged Out   Meningitis B Vaccine  Aged Out   Colon Cancer Screening  Discontinued  *Topic was postponed. The date shown is not the original due date.    Advanced directives:  (Copy Requested) Please bring a copy of your health care power of attorney and living will to the office to be added to your chart at your convenience. You can mail to [facility] [address] [place], [place] [address] or email to [email or web address] Advance Care Planning is important because it:  [x]  Makes sure you receive the medical care that is consistent with your values, goals, and preferences  [x]  It provides guidance to your family and loved ones and reduces their decisional burden about whether or not they are making the right decisions based on your wishes.  Follow the link provided in your after visit summary or read over the paperwork we have mailed to you to help you started getting your Advance Directives in place. If you need assistance in completing these, please reach out to us  so that we can help you!

## 2023-10-29 NOTE — Progress Notes (Signed)
 Subjective:   Sharon Knight is a 78 y.o. who presents for a Medicare Wellness preventive visit.  As a reminder, Annual Wellness Visits don't include a physical exam, and some assessments may be limited, especially if this visit is performed virtually. We may recommend an in-person follow-up visit with your provider if needed.  Visit Complete: Virtual I connected with  Sharon Knight on 10/29/23 by a audio enabled telemedicine application and verified that I am speaking with the correct person using two identifiers.  Patient Location: Home  Provider Location: Office/Clinic  I discussed the limitations of evaluation and management by telemedicine. The patient expressed understanding and agreed to proceed.  Vital Signs: Because this visit was a virtual/telehealth visit, some criteria may be missing or patient reported. Any vitals not documented were not able to be obtained and vitals that have been documented are patient reported.  VideoDeclined- This patient declined Librarian, academic. Therefore the visit was completed with audio only.  Persons Participating in Visit: Patient.  AWV Questionnaire: No: Patient Medicare AWV questionnaire was not completed prior to this visit.  Cardiac Risk Factors include: advanced age (>66men, >25 women);diabetes mellitus;dyslipidemia;hypertension     Objective:    Today's Vitals   10/29/23 0816  Weight: 154 lb (69.9 kg)  Height: 5' 3 (1.6 m)   Body mass index is 27.28 kg/m.     10/29/2023    8:17 AM 10/26/2022   11:08 AM 01/21/2022    4:17 PM 11/11/2021    2:22 PM 10/30/2020   10:20 AM 09/14/2019   10:27 AM 05/24/2018   11:49 AM  Advanced Directives  Does Patient Have a Medical Advance Directive? Yes Yes -- No No No No   Type of Estate agent of [place];Living will        Does patient want to make changes to medical advance directive?  Yes (ED - Information included in AVS)       Copy of  Healthcare Power of Attorney in Chart? No - copy requested        Would patient like information on creating a medical advance directive?  Yes (MAU/Ambulatory/Procedural Areas - Information given)  No - Patient declined No - Patient declined No - Patient declined Yes (MAU/Ambulatory/Procedural Areas - Information given)      Data saved with a previous flowsheet row definition    Current Medications (verified) Outpatient Encounter Medications as of 10/29/2023  Medication Sig   atorvastatin  (LIPITOR) 10 MG tablet Take 1 tablet (10 mg total) by mouth daily.   diltiazem  (CARDIZEM  CD) 180 MG 24 hr capsule Take 1 capsule (180 mg total) by mouth daily.   Docusate Sodium (STOOL SOFTENER LAXATIVE PO) Take 3-4 capsules by mouth daily.   ELIQUIS  5 MG TABS tablet TAKE ONE TABLET BY MOUTH TWICE DAILY   flecainide  (TAMBOCOR ) 100 MG tablet Take 1 tablet (100 mg total) by mouth 2 (two) times daily. Please call office to schedule an appt for further refills. Thank you   hydrochlorothiazide  (HYDRODIURIL ) 25 MG tablet TAKE ONE TABLET BY MOUTH ONCE DAILY   magnesium 30 MG tablet Take 30 mg by mouth 2 (two) times daily.   Misc Natural Products (TART CHERRY ADVANCED PO) Take 2 capsules by mouth 2 (two) times daily.   olmesartan  (BENICAR ) 5 MG tablet Take 1 tablet (5 mg total) by mouth daily.   No facility-administered encounter medications on file as of 10/29/2023.    Allergies (verified) Ibuprofen   History: Past  Medical History:  Diagnosis Date   Arthritis    Bilateral cataracts    Dr Rosan   Exertional dyspnea 12/19/2020   Hyperlipidemia    Hypertension    Past Surgical History:  Procedure Laterality Date   COLONOSCOPY     Dr Aneita   NEPHRECTOMY     right; for cyst on kidney   OVARIAN CYST REMOVAL     Family History  Adopted: Yes  Problem Relation Age of Onset   Colon cancer Neg Hx    Stomach cancer Neg Hx    Breast cancer Neg Hx    Social History   Socioeconomic History    Marital status: Married    Spouse name: Not on file   Number of children: Not on file   Years of education: Not on file   Highest education level: Not on file  Occupational History   Not on file  Tobacco Use   Smoking status: Former    Current packs/day: 0.00    Types: Cigarettes    Quit date: 08/27/1984    Years since quitting: 39.1   Smokeless tobacco: Never   Tobacco comments:    smoked age [age], up to 1.5-2 ppd  Vaping Use   Vaping status: Never Used  Substance and Sexual Activity   Alcohol use: Not Currently   Drug use: No   Sexual activity: Not Currently  Other Topics Concern   Not on file  Social History Narrative   Married   Social Drivers of Health   Financial Resource Strain: Low Risk  (10/29/2023)   Overall Financial Resource Strain (CARDIA)    Difficulty of Paying Living Expenses: Not hard at all  Food Insecurity: No Food Insecurity (10/29/2023)   Hunger Vital Sign    Worried About Running Out of Food in the Last Year: Never true    Ran Out of Food in the Last Year: Never true  Transportation Needs: No Transportation Needs (10/29/2023)   PRAPARE - Administrator, Civil Service (Medical): No    Lack of Transportation (Non-Medical): No  Physical Activity: Inactive (10/29/2023)   Exercise Vital Sign    Days of Exercise per Week: 0 days    Minutes of Exercise per Session: 0 min  Stress: No Stress Concern Present (10/29/2023)   [employer] of Occupational Health - Occupational Stress Questionnaire    Feeling of Stress: Only a little  Social Connections: Moderately Isolated (10/29/2023)   Social Connection and Isolation Panel    Frequency of Communication with Friends and Family: More than three times a week    Frequency of Social Gatherings with Friends and Family: Once a week    Attends Religious Services: Never    Database administrator or Organizations: No    Attends Engineer, structural: Never    Marital Status: Married     Tobacco Counseling Counseling given: No Tobacco comments: smoked age [age], up to 1.5-2 ppd    Clinical Intake:  Pre-visit preparation completed: Yes  Pain : No/denies pain     BMI - recorded: 27.28 Nutritional Status: BMI 25 -29 Overweight Nutritional Risks: None Diabetes: Yes CBG done?: No Did pt. bring in CBG monitor from home?: No  Lab Results  Component Value Date   HGBA1C 7.4 (H) 06/09/2023   HGBA1C 7.0 (H) 12/01/2022   HGBA1C 7.2 (H) 05/05/2022     How often do you need to have someone help you when you read instructions, pamphlets, or other written materials  from your doctor or pharmacy?: 1 - Never  Interpreter Needed?: No  Information entered by :: Sharon Knight Saba, CMA   Activities of Daily Living     10/29/2023    8:21 AM  In your present state of health, do you have any difficulty performing the following activities:  Hearing? 1  Comment referral to an Audiologist  Vision? 0  Difficulty concentrating or making decisions? 0  Walking or climbing stairs? 1  Comment uses a cane - right hip/leg pain  Dressing or bathing? 0  Doing errands, shopping? 0  Preparing Food and eating ? N  Using the Toilet? N  In the past six months, have you accidently leaked urine? Y  Comment wears a pad/pantyliner  Do you have problems with loss of bowel control? N  Managing your Medications? N  Managing your Finances? N  Housekeeping or managing your Housekeeping? N    Patient Care Team: Geofm Glade PARAS, MD as PCP - General (Internal Medicine) Raford Riggs, MD as PCP - Cardiology (Cardiology) Inocencio Soyla Lunger, MD as PCP - Electrophysiology (Cardiology) Tobie Baptist, MD as Consulting Physician (Ophthalmology) [place], [facility] (Ophthalmology) Rozella, Toribio BROCKS, [facility] (Inactive) (Pharmacist) Marlee Bernardino NOVAK, MD as Consulting Physician (Nephrology) Leslee Reusing, MD as Consulting Physician (Ophthalmology)  I have updated your Care  Teams any recent Medical Services you may have received from other providers in the past year.     Assessment:   This is a routine wellness examination for Sharon Knight.  Hearing/Vision screen Hearing Screening - Comments:: Referral to an Audiologist Vision Screening - Comments:: Wears rx glasses - up to date with routine eye exams with Dr Leslee and Dr Tobie   Goals Addressed               This Visit's Progress     Patient Stated (pt-stated)        Patient stated she plans to continue to watch her diet and manage her right hip pain       Depression Screen     10/29/2023    8:25 AM 10/26/2022   11:06 AM 05/05/2022    1:10 PM 11/11/2021    2:25 PM 10/30/2020   10:23 AM 10/16/2020   10:29 AM 09/14/2019   10:45 AM  PHQ 2/9 Scores  PHQ - 2 Score 0 0 0 0 0 0 1  PHQ- 9 Score 3 0 1    2    Fall Risk     10/29/2023    8:23 AM 10/26/2022   11:07 AM 05/05/2022    1:06 PM 11/11/2021    2:24 PM 10/30/2020   10:22 AM  Fall Risk   Falls in the past year? 0 0 0 0 0  Number falls in past yr: 0 0 0 0 0  Injury with Fall? 0 0 0 0 0  Risk for fall due to : No Fall Risks;Impaired balance/gait;History of fall(s) No Fall Risks No Fall Risks No Fall Risks No Fall Risks  Follow up Falls evaluation completed;Falls prevention discussed Falls evaluation completed Falls evaluation completed  Falls evaluation completed  Falls evaluation completed      Data saved with a previous flowsheet row definition    MEDICARE RISK AT HOME:  Medicare Risk at Home Any stairs in or around the home?: Yes (outside) If so, are there any without handrails?: No Home free of loose throw rugs in walkways, pet beds, electrical cords, etc?: Yes Adequate lighting in your home to reduce risk  of falls?: Yes Life alert?: No Use of a cane, walker or w/c?: Yes (cane) Grab bars in the bathroom?: Yes Shower chair or bench in shower?: Yes Elevated toilet seat or a handicapped toilet?: Yes  TIMED UP AND GO:  Was the test  performed?  No  Cognitive Function: 6CIT completed    02/19/2017   11:07 AM  MMSE - Mini Mental State Exam  Orientation to time 5   Orientation to Place 5   Registration 3   Attention/ Calculation 4   Recall 2   Language- name 2 objects 2   Language- repeat 1  Language- follow 3 step command 3   Language- read & follow direction 1   Write a sentence 1   Copy design 1   Total score 28      Data saved with a previous flowsheet row definition        10/29/2023    8:28 AM 10/26/2022   11:08 AM 09/14/2019   10:42 AM  6CIT Screen  What Year? 0 points 0 points 0 points  What month? 0 points 0 points 0 points  What time? 0 points 0 points 0 points  Count back from 20 0 points 0 points 0 points  Months in reverse 0 points 0 points 0 points  Repeat phrase 0 points 4 points 0 points  Total Score 0 points 4 points 0 points    Immunizations Immunization History  Administered Date(s) Administered   Fluad Quad(high Dose 65+) 01/21/2019, 02/01/2020, 02/05/2021, 02/23/2022   Influenza Split 02/18/2011, 02/18/2012   Influenza Whole 03/02/2007, 02/03/2008, 02/08/2009, 02/25/2010   Influenza, High Dose Seasonal PF 02/14/2013, 02/05/2016, 02/03/2017, 02/15/2018, 02/05/2023   Influenza,inj,Quad PF,6+ Mos 02/20/2014, 02/11/2015   Influenza-Unspecified 02/02/2016   Moderna Covid-19 Vaccine Bivalent Booster 76yrs & up 04/15/2021   PFIZER(Purple Top)SARS-COV-2 Vaccination 07/15/2019, 08/09/2019, 04/04/2020, 11/21/2020   Pfizer Covid-19 Vaccine Bivalent Booster 58yrs & up 03/11/2022   Pneumococcal Conjugate-13 02/12/2015   Pneumococcal Polysaccharide-23 02/19/2017   Td 03/03/2010   Zoster Recombinant(Shingrix) 02/28/2020, 07/16/2020   Zoster, Live 11/02/2012    Screening Tests Health Maintenance  Topic Date Due   DTaP/Tdap/Td (2 - Tdap) 03/03/2020   FOOT EXAM  10/24/2022   COVID-19 Vaccine (7 - 2024-25 season) 01/03/2023   DEXA SCAN  06/08/2024 (Originally 07/02/2017)   INFLUENZA  VACCINE  12/03/2023   HEMOGLOBIN A1C  12/07/2023   Diabetic kidney evaluation - eGFR measurement  07/13/2024   Diabetic kidney evaluation - Urine ACR  07/13/2024   OPHTHALMOLOGY EXAM  08/01/2024   Medicare Annual Wellness (AWV)  10/28/2024   Pneumococcal Vaccine: 50+ Years  Completed   Hepatitis C Screening  Completed   Zoster Vaccines- Shingrix  Completed   Hepatitis B Vaccines  Aged Out   HPV VACCINES  Aged Out   Meningococcal B Vaccine  Aged Out   Colonoscopy  Discontinued    Health Maintenance  Health Maintenance Due  Topic Date Due   DTaP/Tdap/Td (2 - Tdap) 03/03/2020   FOOT EXAM  10/24/2022   COVID-19 Vaccine (7 - 2024-25 season) 01/03/2023   Health Maintenance Items Addressed:  Referral to an Audiologist - Darryle Posey.  Pt c/o difficulty hearing  Foot Exam status: Appt. W/PCP 12/07/2023.  Additional Screening:  Vision Screening: Recommended annual ophthalmology exams for early detection of glaucoma and other disorders of the eye. Would you like a referral to an eye doctor? No    Dental Screening: Recommended annual dental exams for proper oral hygiene  [employer]  Referral / Chronic Care Management: CRR required this visit?  No   CCM required this visit?  No   Plan:    I have personally reviewed and noted the following in the patient's chart:   Medical and social history Use of alcohol, tobacco or illicit drugs  Current medications and supplements including opioid prescriptions. Patient is not currently taking opioid prescriptions. Functional ability and status Nutritional status Physical activity Advanced directives List of other physicians Hospitalizations, surgeries, and ER visits in previous 12 months Vitals Screenings to include cognitive, depression, and falls Referrals and appointments  In addition, I have reviewed and discussed with patient certain preventive protocols, quality metrics, and best practice recommendations. A written  personalized care plan for preventive services as well as general preventive health recommendations were provided to patient.   Sharon Knight CHRISTELLA Saba, CMA   10/29/2023   After Visit Summary: (Mail) Due to this being a telephonic visit, the after visit summary with patients personalized plan was offered to patient via mail   Notes: Nothing significant to report at this time.

## 2023-11-03 DIAGNOSIS — K080 Exfoliation of teeth due to systemic causes: Secondary | ICD-10-CM | POA: Diagnosis not present

## 2023-11-15 ENCOUNTER — Other Ambulatory Visit (HOSPITAL_BASED_OUTPATIENT_CLINIC_OR_DEPARTMENT_OTHER): Payer: Self-pay | Admitting: Family

## 2023-11-15 DIAGNOSIS — I480 Paroxysmal atrial fibrillation: Secondary | ICD-10-CM

## 2023-11-19 ENCOUNTER — Encounter (HOSPITAL_BASED_OUTPATIENT_CLINIC_OR_DEPARTMENT_OTHER): Payer: Self-pay | Admitting: Family

## 2023-11-19 ENCOUNTER — Ambulatory Visit (HOSPITAL_BASED_OUTPATIENT_CLINIC_OR_DEPARTMENT_OTHER): Admitting: Family

## 2023-11-19 VITALS — BP 110/58 | HR 66 | Ht 63.0 in | Wt 156.0 lb

## 2023-11-19 DIAGNOSIS — I480 Paroxysmal atrial fibrillation: Secondary | ICD-10-CM

## 2023-11-19 DIAGNOSIS — E782 Mixed hyperlipidemia: Secondary | ICD-10-CM

## 2023-11-19 DIAGNOSIS — D6859 Other primary thrombophilia: Secondary | ICD-10-CM | POA: Diagnosis not present

## 2023-11-19 DIAGNOSIS — I10 Essential (primary) hypertension: Secondary | ICD-10-CM | POA: Diagnosis not present

## 2023-11-19 MED ORDER — OLMESARTAN MEDOXOMIL 5 MG PO TABS: 5.0000 mg | ORAL_TABLET | Freq: Every day | ORAL | 3 refills | Status: AC

## 2023-11-19 MED ORDER — ATORVASTATIN CALCIUM 10 MG PO TABS
10.0000 mg | ORAL_TABLET | Freq: Every day | ORAL | 3 refills | Status: AC
Start: 2023-11-19 — End: ?

## 2023-11-19 MED ORDER — DILTIAZEM HCL ER COATED BEADS 180 MG PO CP24: 180.0000 mg | ORAL_CAPSULE | Freq: Every day | ORAL | 3 refills | Status: AC

## 2023-11-19 MED ORDER — FLECAINIDE ACETATE 100 MG PO TABS: 100.0000 mg | ORAL_TABLET | Freq: Two times a day (BID) | ORAL | 1 refills | Status: AC

## 2023-11-19 MED ORDER — HYDROCHLOROTHIAZIDE 25 MG PO TABS: 25.0000 mg | ORAL_TABLET | Freq: Every day | ORAL | 3 refills | Status: DC

## 2023-11-19 NOTE — Progress Notes (Signed)
 Cardiology Office Note   Date:  11/22/2023  ID:  Sharon Knight 1945-10-27, MRN [id number] PCP: Geofm Glade PARAS, MD  [facility] Providers Cardiologist:  Annabella Scarce, MD Cardiology APP:  Vannie Reche RAMAN, NP  Electrophysiologist:  Will Gladis Norton, MD     History of Present Illness Sharon Knight is a 78 y.o. female  with a hx of PAF, HTN, HLD, DM2, right nephrectomy.  She was admitted 12/2017 with atrial fibrillation with RVR in setting of sepsis and pneumonia. Home dose Metoprolol  increased and Eliquis  started. Echo 12/2017 LVEF 65-70%, moderate LVH. At follow up 12/22/17 she was in NSR. She was seen by Dr. Norton 12/30/17 and started on Flecainide . Metoprolol  was discontinued due to bradycardia. HCYZ increased and Olmesartan  added due to BP.    She was seen 12/2020 noting fatigue with TSH normal and Myoview  12/31/20 was low risk with no evidence of ischemia.  Last seen March 2022 for doing well from a cardiac perspective.  She had recently found some family members through ancestry.  She was recommended to consider Farxiga as recommended by her primary care provider.  Discussed the use of AI scribe software for clinical note transcription with the patient, who gave verbal consent to proceed.  History of Present Illness Sharon Knight is a 78 year old female with atrial fibrillation who presents for cardiovascular follow-up.  She has been doing overall well from a cardiac perspective.  She experiences occasional heart fluttering when overheated, without associated chest pain.  This is not bothersome.  Her kidney function was checked in March by nephrology and was stable. Cholesterol panel from February with total cholesterol 164, triglycerides 126, HDL 68, LDL 70.  Activity tolerance presently limited by hip pain and is considering surgery. Still able to achieve household tasks >4 METS.    ROS: Please see the history of present illness.    All other systems reviewed  and are negative.   Studies Reviewed EKG Interpretation Date/Time:  Friday November 19 2023 10:15:52 EDT Ventricular Rate:  66 PR Interval:  192 QRS Duration:  102 QT Interval:  426 QTC Calculation: 446 R Axis:   -6  Text Interpretation: Normal sinus rhythm No acute ST/T wave changes LOW VOLTAGE Confirmed by Vannie Reche ([id number]) on 11/19/2023 10:43:21 AM    Cardiac Studies & Procedures   ______________________________________________________________________________________________   STRESS TESTS  MYOCARDIAL PERFUSION IMAGING 12/31/2020  Interpretation Summary   Lexiscan  stress is electrically negative for ischemia   Myoview  scan shows normal perfusion.   Nuclear stress EF: 77 %. The left ventricular ejection fraction is hyperdynamic (>65%). Left ventricular function is normal. End diastolic cavity size is normal.   Low risk scan.   ECHOCARDIOGRAM  ECHOCARDIOGRAM COMPLETE 12/11/2017  Narrative *[name]* *[facility]* [address] [name], [name] [phone]  ------------------------------------------------------------------- Transthoracic Echocardiography  Patient:    Sharon Knight MR #:       [id number] Study Date: 12/11/2017 Gender:     F Age:        78 Height:     167.6 cm Weight:     75.4 kg BSA:        1.89 m^2 Pt. Status: Room:       1229  ADMITTING    [name] [name] REFERRING    [name] PERFORMING   [facility], Inpatient ATTENDING    [name], [name] SONOGRAPHER  [name]  cc:  -------------------------------------------------------------------  LV EF: 65% -   70%  ------------------------------------------------------------------- Indications:      Atrial fibrillation - 427.31.  ------------------------------------------------------------------- History:   Risk factors:  Hypertension. Dyslipidemia.  ------------------------------------------------------------------- Study  Conclusions  - Left ventricle: The cavity size was normal. Wall thickness was increased in a pattern of moderate LVH. Systolic function was vigorous. The estimated ejection fraction was in the range of 65% to 70%. Wall motion was normal; there were no regional wall motion abnormalities. The study was not technically sufficient to allow evaluation of LV diastolic dysfunction due to atrial fibrillation. - Aortic valve: Valve area (VTI): 2.14 cm^2. Valve area (Vmax): 2.43 cm^2. Valve area (Vmean): 2.33 cm^2. - Mitral valve: Valve area by continuity equation (using LVOT flow): 1.61 cm^2. - Atrial septum: No defect or patent foramen ovale was identified.  ------------------------------------------------------------------- Study data:  No prior study was available for comparison.  Study status:  Routine.  Procedure:  The patient reported no pain pre or post test. Transthoracic echocardiography. Image quality was adequate.  Study completion:  There were no complications. Transthoracic echocardiography.  M-mode, complete 2D, spectral Doppler, and color Doppler.  Birthdate:  Patient birthdate: 01-Sep-1945.  Age:  Patient is 78 yr old old.  Sex:  Gender: female. BMI: 26.8 kg/m^2.  Blood pressure:     134/78  Patient status: Inpatient.  Study date:  Study date: 12/11/2017. Study time: 07:48 AM.  Location:  Bedside.  -------------------------------------------------------------------  ------------------------------------------------------------------- Left ventricle:  The cavity size was normal. Wall thickness was increased in a pattern of moderate LVH. Systolic function was vigorous. The estimated ejection fraction was in the range of 65% to 70%. Wall motion was normal; there were no regional wall motion abnormalities. The study was not technically sufficient to allow evaluation of LV diastolic dysfunction due to atrial  fibrillation.  ------------------------------------------------------------------- Aortic valve:   Trileaflet; normal thickness leaflets.  Doppler: There was no stenosis.   There was no significant regurgitation. VTI ratio of LVOT to aortic valve: 0.84. Valve area (VTI): 2.14 cm^2. Indexed valve area (VTI): 1.13 cm^2/m^2. Peak velocity ratio of LVOT to aortic valve: 0.95. Valve area (Vmax): 2.43 cm^2. Indexed valve area (Vmax): 1.29 cm^2/m^2. Mean velocity ratio of LVOT to aortic valve: 0.91. Valve area (Vmean): 2.33 cm^2. Indexed valve area (Vmean): 1.23 cm^2/m^2.    Mean gradient (S): 5 mm Hg. Peak gradient (S): 9 mm Hg.  ------------------------------------------------------------------- Aorta:  Aortic root: The aortic root was normal in size.  ------------------------------------------------------------------- Mitral valve:   Normal thickness leaflets .  Doppler:   There was no evidence for stenosis.   There was no significant regurgitation. Valve area by continuity equation (using LVOT flow): 1.61 cm^2. Indexed valve area by continuity equation (using LVOT flow): 0.85 cm^2/m^2.    Mean gradient (D): 3 mm Hg. Peak gradient (D): 8 mm Hg.  ------------------------------------------------------------------- Left atrium:  The atrium was normal in size.  ------------------------------------------------------------------- Atrial septum:  Poorly visualized. No defect or patent foramen ovale was identified.  ------------------------------------------------------------------- Right ventricle:  The cavity size was normal. Wall thickness was normal. Systolic function was normal.  ------------------------------------------------------------------- Pulmonic valve:   Not well visualized.  Doppler:   There was no evidence for stenosis.   There was no significant regurgitation.  ------------------------------------------------------------------- Tricuspid valve:   Normal thickness  leaflets.  Doppler:   There was no evidence for stenosis.   There was trivial regurgitation.  ------------------------------------------------------------------- Pulmonary artery:    Systolic pressure could not be accurately estimated.   Inadequate TR jet.  ------------------------------------------------------------------- Right  atrium:  The atrium was normal in size.  ------------------------------------------------------------------- Pericardium:  There was no pericardial effusion.  ------------------------------------------------------------------- Systemic veins: Inferior vena cava: The vessel was normal in size. The respirophasic diameter changes were in the normal range (>= 50%), consistent with normal central venous pressure.  ------------------------------------------------------------------- Measurements  Left ventricle                          Value           Reference LV ID, ED, PLAX chordal         (L)     33.7   mm       43 - 52 LV ID, ES, PLAX chordal         (L)     16.3   mm       23 - 38 LV fx shortening, PLAX chordal          52     %        >=29 LV PW thickness, ED                     3      mm       ---------- IVS/LV PW ratio, ED             (H)     4.8             <=1.3 Stroke volume, 2D                       41     ml       ---------- Stroke volume/bsa, 2D                   22     ml/m^2   ---------- LV ejection fraction, 1-p A4C           78     %        ---------- LV end-diastolic volume, 2-p            38     ml       ---------- LV end-systolic volume, 2-p             10     ml       ---------- LV ejection fraction, 2-p               73     %        ---------- Stroke volume, 2-p                      28     ml       ---------- LV end-diastolic volume/bsa,            20     ml/m^2   ---------- 2-p LV end-systolic volume/bsa, 2-p         6      ml/m^2   ---------- Stroke volume/bsa, 2-p                  14.8   ml/m^2   ---------- LV ejection time                         260    ms       ----------  Ventricular septum  Value           Reference IVS thickness, ED                       14.4   mm       ----------  LVOT                                    Value           Reference LVOT ID, S                              18     mm       ---------- LVOT area                               2.54   cm^2     ---------- LVOT peak velocity, S                   140    cm/s     ---------- LVOT mean velocity, S                   93     cm/s     ---------- LVOT VTI, S                             22.9   cm       ---------- LVOT peak gradient, S                   8      mm Hg    ----------  Aortic valve                            Value           Reference Aortic valve peak velocity, S           146.66 cm/s     ---------- Aortic valve mean velocity, S           101.74 cm/s     ---------- Aortic valve VTI, S                     27.23  cm       ---------- Aortic mean gradient, S                 5      mm Hg    ---------- Aortic peak gradient, S                 9      mm Hg    ---------- VTI ratio, LVOT/AV                      0.84            ---------- Aortic valve area, VTI                  2.14   cm^2     ---------- Aortic valve area/bsa, VTI              1.13   cm^2/m^2 ---------- Velocity ratio, peak,  LVOT/AV           0.95            ---------- Aortic valve area, peak                 2.43   cm^2     ---------- velocity Aortic valve area/bsa, peak             1.29   cm^2/m^2 ---------- velocity Velocity ratio, mean, LVOT/AV           0.91            ---------- Aortic valve area, mean                 2.33   cm^2     ---------- velocity Aortic valve area/bsa, mean             1.23   cm^2/m^2 ---------- velocity  Aorta                                   Value           Reference Aortic root ID, ED                      31     mm       ---------- Ascending aorta ID, A-P, S              33     mm       ----------  Left atrium                              Value           Reference LA ID, A-P, ES                          44     mm       ---------- LA ID/bsa, A-P                  (H)     2.33   cm/m^2   <=2.2 LA volume, S                            52.1   ml       ---------- LA volume/bsa, S                        27.6   ml/m^2   ---------- LA volume, ES, 1-p A4C                  45.5   ml       ---------- LA volume/bsa, ES, 1-p A4C              24.1   ml/m^2   ---------- LA volume, ES, 1-p A2C                  56.6   ml       ---------- LA volume/bsa, ES, 1-p A2C              30     ml/m^2   ----------  Mitral valve  Value           Reference Mitral E-wave peak velocity             144    cm/s     ---------- Mitral mean velocity, D                 70.2   cm/s     ---------- Mitral deceleration time        (L)     77     ms       150 - 230 Mitral mean gradient, D                 3      mm Hg    ---------- Mitral peak gradient, D                 8      mm Hg    ---------- Mitral valve area, LVOT                 1.61   cm^2     ---------- continuity Mitral valve area/bsa, LVOT             0.85   cm^2/m^2 ---------- continuity Mitral annulus VTI, D                   25.1   cm       ----------  Tricuspid valve                         Value           Reference Tricuspid regurg peak velocity          235    cm/s     ---------- Tricuspid peak RV-RA gradient           22     mm Hg    ----------  Right atrium                            Value           Reference RA area, ES, A4C                        10.6   cm^2     8.3 - 19.5  Systemic veins                          Value           Reference Estimated CVP                           3      mm Hg    ----------  Right ventricle                         Value           Reference RV ID, ED, PLAX                         24.3   mm       19 - 38 TAPSE  16.5   mm       ---------- RV pressure, S, DP                      25     mm Hg     <=30  Legend: (L)  and  (H)  mark values outside specified reference range.  ------------------------------------------------------------------- Prepared and Electronically Authenticated by  Cassondra Ross, M.D. 2019-08-10T13:00:06    MONITORS  CARDIAC EVENT MONITOR 12/23/2017  Narrative 30 Day Event Monitor  Quality: Fair.  Baseline artifact. Predominant rhythm: sinus rhythm Average heart rate: 68 bpm Max heart rate: 174 bpm Min heart rate: 38 bpm 199 pauses >3 seconds Pauses noted up to 6 seconds. All occurred over night or early morning. PVCs Atrial flutter 16% (71% controlled, <1% slow, 29% rapid)   Tiffany C. Raford, MD, [facility] 02/22/2018 12:25 PM       ______________________________________________________________________________________________      Risk Assessment/Calculations  CHA2DS2-VASc Score = 5   This indicates a 7.2% annual risk of stroke. The patient's score is based upon: CHF History: 0 HTN History: 1 Diabetes History: 1 Stroke History: 0 Vascular Disease History: 0 Age Score: 2 Gender Score: 1       STOP-Bang Score:         Physical Exam VS:  BP (!) 110/58 (BP Location: Right Arm, Patient Position: Sitting, Cuff Size: Normal)   Pulse 66   Ht 5' 3 (1.6 m)   Wt 156 lb (70.8 kg)   SpO2 97%   BMI 27.63 kg/m        Wt Readings from Last 3 Encounters:  11/19/23 156 lb (70.8 kg)  10/29/23 154 lb (69.9 kg)  09/16/23 154 lb (69.9 kg)    GEN: Well nourished, well developed in no acute distress NECK: No JVD; No carotid bruits CARDIAC: RRR, no murmurs, rubs, gallops RESPIRATORY:  Clear to auscultation without rales, wheezing or rhonchi  ABDOMEN: Soft, non-tender, non-distended EXTREMITIES:  No edema; No deformity   ASSESSMENT AND PLAN PAF / Chronic anticoagulation - EKG today shows normal sinus rhythm.  CHA2DS2-VASc Score = 5 [CHF History: 0, HTN History: 1, Diabetes History: 1, Stroke History: 0, Vascular Disease History: 0, Age  Score: 2, Gender Score: 1].  Therefore, the patient's annual risk of stroke is 7.2 %.     Continue Eliquis  5mg  BID (does not meet does restriction, Flecainide  100mg  BID, Diltiazem  180mg  every day. Denies bleeding complications. Has upcoming AWV with Dr. Geofm, recommend magnesium level at that time for monitoring on Flecainide .   CKD III - Careful titration of diuretic and antihypertensive.  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    HTN - BP well controlled. Continue current antihypertensive regimen including HCTZ 25mg  QD, Olmesartan  5mg  QD, Diltiazem  180mg  QD. Discussed to monitor BP at home at least 2 hours after medications and sitting for 5-10 minutes.    HLD - 06/2023 LDL70.  At goal of <100.  Continue Atorvastatin  10mg  daily.    Hip pain / Preop - awaiting MRI and considering surgery. If she does elect for surgery, exercise tolerance >4 METS. Per AHA/ACC guidelines, she is deemed acceptable risk for the planned procedure.  Would need surgical team to route request to our office for pharmacy team review of Eliquis  prior to surgery.   DM2 -  Continue to follow with PCP.        Dispo: follow up in 1 year   Signed, Reche GORMAN Finder, NP

## 2023-11-19 NOTE — Patient Instructions (Addendum)
 Medication Instructions:  Continue your current medications  We sent refills of your cardiac medications  *If you need a refill on your cardiac medications before your next appointment, please call your pharmacy*  Lab Work: Recommend getting your magnesium level checked with next labs. Can do with Dr. Geofm in August if she is okay with that or can stop by [employer] at [employer]. You do not need to fast prior to these labs.   If you have labs (blood work) drawn today and your tests are completely normal, you will receive your results only by: MyChart Message (if you have MyChart) OR A paper copy in the mail If you have any lab test that is abnormal or we need to change your treatment, we will call you to review the results.  Testing/Procedures: Your EKG today looked great!  Follow-Up: At [facility], you and your health needs are our priority.  As part of our continuing mission to provide you with exceptional heart care, our providers are all part of one team.  This team includes your primary Cardiologist (physician) and Advanced Practice Providers or APPs (Physician Assistants and Nurse Practitioners) who all work together to provide you with the care you need, when you need it.  Your next appointment:   1 year(s)  Provider:   Annabella Scarce, MD, Rosaline Bane, NP, or Reche Finder, NP    We recommend signing up for the patient portal called MyChart.  Sign up information is provided on this After Visit Summary.  MyChart is used to connect with patients for Virtual Visits (Telemedicine).  Patients are able to view lab/test results, encounter notes, upcoming appointments, etc.  Non-urgent messages can be sent to your provider as well.   To learn more about what you can do with MyChart, go to [email or web address].   Other Instructions  Continue to follow with Dr. Joane regarding hip pain. If you do elect to have surgery, your surgeon will send a note to our  team to make sure you hold your Eliquis  for the proper amount of time prior to surgery.

## 2023-12-06 ENCOUNTER — Encounter: Payer: Self-pay | Admitting: Internal Medicine

## 2023-12-06 NOTE — Patient Instructions (Addendum)
      Blood work was ordered.       Medications changes include :   None    A referral was ordered and someone will call you to schedule an appointment.     Return in about 6 months (around 06/08/2024) for Physical Exam.

## 2023-12-06 NOTE — Progress Notes (Unsigned)
 Subjective:    Patient ID: Sharon Knight, female    DOB: [birth year], 78 y.o.   MRN: [id number]     HPI Sharon Knight is here for follow up of her chronic medical problems.  Having right hip pain - will see Dr Joane again - he told her she needs a hip replacement.   Medications and allergies reviewed with patient and updated if appropriate.  Current Outpatient Medications on File Prior to Visit  Medication Sig Dispense Refill   atorvastatin  (LIPITOR) 10 MG tablet Take 1 tablet (10 mg total) by mouth daily. 90 tablet 3   diltiazem  (CARTIA  XT) 180 MG 24 hr capsule Take 1 capsule (180 mg total) by mouth daily. 90 capsule 3   Docusate Sodium (STOOL SOFTENER LAXATIVE PO) Take 3-4 capsules by mouth daily.     ELIQUIS  5 MG TABS tablet TAKE ONE TABLET BY MOUTH TWICE DAILY 60 tablet 5   flecainide  (TAMBOCOR ) 100 MG tablet Take 1 tablet (100 mg total) by mouth 2 (two) times daily. Please call office to schedule an appt for further refills. Thank you 180 tablet 1   hydrochlorothiazide  (HYDRODIURIL ) 25 MG tablet Take 1 tablet (25 mg total) by mouth daily. 90 tablet 3   magnesium 30 MG tablet Take 30 mg by mouth 2 (two) times daily.     Misc Natural Products (TART CHERRY ADVANCED PO) Take 2 capsules by mouth 2 (two) times daily.     olmesartan  (BENICAR ) 5 MG tablet Take 1 tablet (5 mg total) by mouth daily. 90 tablet 3   No current facility-administered medications on file prior to visit.     Review of Systems  Constitutional:  Negative for fever.  Respiratory:  Negative for cough, shortness of breath and wheezing.   Cardiovascular:  Negative for chest pain, palpitations and leg swelling.  Musculoskeletal:  Positive for arthralgias.  Neurological:  Positive for dizziness (occ) and light-headedness. Negative for headaches.       Objective:   Vitals:   12/07/23 1336  BP: 120/64  Pulse: 63  Temp: 97.9 F (36.6 C)  SpO2: 99%   BP Readings from Last 3 Encounters:  12/07/23 120/64   11/19/23 (!) 110/58  09/16/23 120/74   Wt Readings from Last 3 Encounters:  12/07/23 157 lb (71.2 kg)  11/19/23 156 lb (70.8 kg)  10/29/23 154 lb (69.9 kg)   Body mass index is 27.81 kg/m.    Physical Exam Constitutional:      General: She is not in acute distress.    Appearance: Normal appearance.  HENT:     Head: Normocephalic and atraumatic.  Eyes:     Conjunctiva/sclera: Conjunctivae normal.  Cardiovascular:     Rate and Rhythm: Normal rate and regular rhythm.     Heart sounds: Normal heart sounds.  Pulmonary:     Effort: Pulmonary effort is normal. No respiratory distress.     Breath sounds: Normal breath sounds. No wheezing.  Musculoskeletal:     Cervical back: Neck supple.     Right lower leg: No edema.     Left lower leg: No edema.  Lymphadenopathy:     Cervical: No cervical adenopathy.  Skin:    General: Skin is warm and dry.     Findings: No rash.  Neurological:     Mental Status: She is alert. Mental status is at baseline.  Psychiatric:        Mood and Affect: Mood normal.  Behavior: Behavior normal.       Diabetic Foot Exam - Simple   Simple Foot Form Diabetic Foot exam was performed with the following findings: Yes 12/07/2023  2:01 PM  Visual Inspection No deformities, no ulcerations, no other skin breakdown bilaterally: Yes Sensation Testing Intact to touch and monofilament testing bilaterally: Yes Pulse Check Posterior Tibialis and Dorsalis pulse intact bilaterally: Yes Comments      Lab Results  Component Value Date   WBC 9.8 06/09/2023   HGB 13.8 06/09/2023   HCT 41.2 06/09/2023   PLT 375.0 06/09/2023   GLUCOSE 126 (H) 06/09/2023   CHOL 164 06/09/2023   TRIG 126.0 06/09/2023   HDL 68.90 06/09/2023   LDLCALC 70 06/09/2023   ALT 17 06/09/2023   AST 26 06/09/2023   NA 138 06/09/2023   K 3.6 06/09/2023   CL 99 06/09/2023   CREATININE 1.11 06/09/2023   BUN 25 (H) 06/09/2023   CO2 29 06/09/2023   TSH 2.35 05/05/2022   HGBA1C  7.4 (H) 06/09/2023   MICROALBUR 0.2 11/01/2007     Assessment & Plan:    See Problem List for Assessment and Plan of chronic medical problems.

## 2023-12-07 ENCOUNTER — Ambulatory Visit (INDEPENDENT_AMBULATORY_CARE_PROVIDER_SITE_OTHER): Payer: Medicare Other | Admitting: Internal Medicine

## 2023-12-07 ENCOUNTER — Telehealth: Payer: Self-pay | Admitting: Family Medicine

## 2023-12-07 VITALS — BP 120/64 | HR 63 | Temp 97.9°F | Ht 63.0 in | Wt 157.0 lb

## 2023-12-07 DIAGNOSIS — I10 Essential (primary) hypertension: Secondary | ICD-10-CM

## 2023-12-07 DIAGNOSIS — N1832 Chronic kidney disease, stage 3b: Secondary | ICD-10-CM | POA: Diagnosis not present

## 2023-12-07 DIAGNOSIS — E1122 Type 2 diabetes mellitus with diabetic chronic kidney disease: Secondary | ICD-10-CM

## 2023-12-07 DIAGNOSIS — N1831 Chronic kidney disease, stage 3a: Secondary | ICD-10-CM

## 2023-12-07 DIAGNOSIS — E782 Mixed hyperlipidemia: Secondary | ICD-10-CM | POA: Diagnosis not present

## 2023-12-07 DIAGNOSIS — E559 Vitamin D deficiency, unspecified: Secondary | ICD-10-CM | POA: Diagnosis not present

## 2023-12-07 DIAGNOSIS — I480 Paroxysmal atrial fibrillation: Secondary | ICD-10-CM

## 2023-12-07 DIAGNOSIS — Z8739 Personal history of other diseases of the musculoskeletal system and connective tissue: Secondary | ICD-10-CM

## 2023-12-07 DIAGNOSIS — M25551 Pain in right hip: Secondary | ICD-10-CM

## 2023-12-07 LAB — CBC WITH DIFFERENTIAL/PLATELET
Basophils Absolute: 0.1 K/uL (ref 0.0–0.1)
Basophils Relative: 0.9 % (ref 0.0–3.0)
Eosinophils Absolute: 0.1 K/uL (ref 0.0–0.7)
Eosinophils Relative: 1.3 % (ref 0.0–5.0)
HCT: 39.8 % (ref 36.0–46.0)
Hemoglobin: 13.4 g/dL (ref 12.0–15.0)
Lymphocytes Relative: 17.5 % (ref 12.0–46.0)
Lymphs Abs: 1.8 K/uL (ref 0.7–4.0)
MCHC: 33.7 g/dL (ref 30.0–36.0)
MCV: 90.9 fl (ref 78.0–100.0)
Monocytes Absolute: 1 K/uL (ref 0.1–1.0)
Monocytes Relative: 9.2 % (ref 3.0–12.0)
Neutro Abs: 7.4 K/uL (ref 1.4–7.7)
Neutrophils Relative %: 71.1 % (ref 43.0–77.0)
Platelets: 359 K/uL (ref 150.0–400.0)
RBC: 4.38 Mil/uL (ref 3.87–5.11)
RDW: 12.4 % (ref 11.5–15.5)
WBC: 10.5 K/uL (ref 4.0–10.5)

## 2023-12-07 LAB — COMPREHENSIVE METABOLIC PANEL WITH GFR
ALT: 13 U/L (ref 0–35)
AST: 20 U/L (ref 0–37)
Albumin: 4.4 g/dL (ref 3.5–5.2)
Alkaline Phosphatase: 92 U/L (ref 39–117)
BUN: 28 mg/dL — ABNORMAL HIGH (ref 6–23)
CO2: 31 meq/L (ref 19–32)
Calcium: 9.6 mg/dL (ref 8.4–10.5)
Chloride: 101 meq/L (ref 96–112)
Creatinine, Ser: 1.13 mg/dL (ref 0.40–1.20)
GFR: 46.3 mL/min — ABNORMAL LOW (ref >60.00)
Glucose, Bld: 132 mg/dL — ABNORMAL HIGH (ref 70–99)
Potassium: 3.9 meq/L (ref 3.5–5.1)
Sodium: 140 meq/L (ref 135–145)
Total Bilirubin: 0.4 mg/dL (ref 0.2–1.2)
Total Protein: 7.7 g/dL (ref 6.0–8.3)

## 2023-12-07 LAB — LIPID PANEL
Cholesterol: 170 mg/dL (ref 0–200)
HDL: 68.3 mg/dL (ref >39.00)
LDL Cholesterol: 78 mg/dL (ref 0–99)
NonHDL: 102.05
Total CHOL/HDL Ratio: 2
Triglycerides: 120 mg/dL (ref 0.0–149.0)
VLDL: 24 mg/dL (ref 0.0–40.0)

## 2023-12-07 LAB — HEMOGLOBIN A1C: Hgb A1c MFr Bld: 6.8 % — ABNORMAL HIGH (ref 4.6–6.5)

## 2023-12-07 LAB — VITAMIN D 25 HYDROXY (VIT D DEFICIENCY, FRACTURES): VITD: 36.59 ng/mL (ref 30.00–100.00)

## 2023-12-07 NOTE — Assessment & Plan Note (Signed)
 Chronic Following with cardiology On diltiazem, Eliquis and flecainide CBC, CMP

## 2023-12-07 NOTE — Assessment & Plan Note (Signed)
 Chronic Lab Results  Component Value Date   LDLCALC 70 06/09/2023   Regular exercise and healthy diet encouraged Check lipid panel, cmp Continue atorvastatin  10 mg daily

## 2023-12-07 NOTE — Assessment & Plan Note (Signed)
 History of gout Denies gout flares since her last visit Lab Results  Component Value Date   LABURIC 7.5 (H) 06/09/2023   Discussed starting allopurinol -was on in the past - deferred Taking tart cherry

## 2023-12-07 NOTE — Telephone Encounter (Signed)
 MRI ordered. Called pt. No answer. Left VM informing her.

## 2023-12-07 NOTE — Assessment & Plan Note (Signed)
 Chronic Blood pressure well controlled CMP, cbc Continue diltiazem 180 mg daily, HCTZ 25 mg daily, Benicar 5 mg daily

## 2023-12-07 NOTE — Assessment & Plan Note (Signed)
 Chronic  Lab Results  Component Value Date   HGBA1C 7.2 (H) 05/05/2022   Sugars not ideally controlled Check A1c, urine microalbumin She deferred medication  Continue lifestyle control  Stressed regular exercise, diabetic diet

## 2023-12-07 NOTE — Assessment & Plan Note (Signed)
 Chronic Not currently takig vitamin d daily Check vitamin d level

## 2023-12-07 NOTE — Assessment & Plan Note (Addendum)
 Chronic Following with nephrology S/p  R nephrectomy for cyst in 2014 CBC, CMP Stressed good blood pressure, sugar control Good water intake Avoid NSAIDs

## 2023-12-07 NOTE — Telephone Encounter (Signed)
 Patient asked if Dr Joane would be able to go ahead and order the MRI that was discussed at her last visit (09/16/23).  Please advise.

## 2023-12-09 ENCOUNTER — Ambulatory Visit: Payer: Self-pay | Admitting: Internal Medicine

## 2023-12-17 ENCOUNTER — Ambulatory Visit
Admission: RE | Admit: 2023-12-17 | Discharge: 2023-12-17 | Disposition: A | Discharge status: Home / Self Care | Source: Ambulatory Visit | Attending: Family Medicine | Admitting: Family Medicine

## 2023-12-17 DIAGNOSIS — M1611 Unilateral primary osteoarthritis, right hip: Secondary | ICD-10-CM | POA: Diagnosis not present

## 2023-12-17 DIAGNOSIS — M25551 Pain in right hip: Secondary | ICD-10-CM

## 2023-12-23 ENCOUNTER — Ambulatory Visit: Payer: Self-pay | Admitting: Family Medicine

## 2023-12-23 NOTE — Progress Notes (Signed)
 Right hip MRI shows severe hip arthritis.  The tendons and bursa look pretty normal.  It looks like the majority of the problem in the hip is the arthritis.  I think you may be heading towards a hip replacement.  Would you like me to place a referral to orthopedic surgery?

## 2023-12-24 NOTE — Telephone Encounter (Signed)
 Pt called requesting earlier f/u visit. R hip is very painful. Last interarticular hip injection, 09/16/23. Visit scheduled for 9/2 and 9/15 visit canceled. Pt verbalized understanding.

## 2023-12-30 DIAGNOSIS — K080 Exfoliation of teeth due to systemic causes: Secondary | ICD-10-CM | POA: Diagnosis not present

## 2024-01-04 ENCOUNTER — Other Ambulatory Visit: Payer: Self-pay

## 2024-01-04 ENCOUNTER — Ambulatory Visit: Admitting: Family Medicine

## 2024-01-04 VITALS — BP 112/74 | HR 60 | Ht 63.0 in

## 2024-01-04 DIAGNOSIS — M25551 Pain in right hip: Secondary | ICD-10-CM

## 2024-01-04 DIAGNOSIS — M1611 Unilateral primary osteoarthritis, right hip: Secondary | ICD-10-CM | POA: Diagnosis not present

## 2024-01-04 NOTE — Progress Notes (Signed)
 LILLETTE Ileana Collet, PhD, LAT, ATC acting as a scribe for Artist Lloyd, MD.  Sharon Knight is a 78 y.o. female who presents to [facility] today for cont'd R hip pain. Pt was last seen by Dr. Lloyd on 09/16/23 and was given a R intra-articular hip injection. Pt later called the office in Aug and a MRI was ordered.  Today, pt reports R hip is very painful. Difficulty ambulating. Pain is located along the posterior-lateral aspect and into the groin on the R.  Dx imaging: 12/17/23 R hip MRI 07/21/23 R hip & L-spine XR   Pertinent review of systems: No fevers or chills  Relevant historical information: Hypertension paroxysmal atrial fibrillation diabetes and chronic kidney disease.   Exam:  BP 112/74   Pulse 60   Ht 5' 3 (1.6 m)   SpO2 98%   BMI 27.81 kg/m  General: Well Developed, well nourished, and in no acute distress.   MSK: Right hip normal-appearing decreased range of motion antalgic gait.    Lab and Radiology Results  Procedure: Real-time Ultrasound Guided Injection of right hip femoral acetabular joint anterior approach Device: Philips Affiniti 50G/GE Logiq Images permanently stored and available for review in PACS Verbal informed consent obtained.  Discussed risks and benefits of procedure. Warned about infection, bleeding, hyperglycemia damage to structures among others. Patient expresses understanding and agreement Time-out conducted.   Noted no overlying erythema, induration, or other signs of local infection.   Skin prepped in a sterile fashion.   Local anesthesia: Topical Ethyl chloride.   With sterile technique and under real time ultrasound guidance: 40 mg of Kenalog  and 2 mL of Marcaine injected into hip joint. Fluid seen entering the joint capsule.   Completed without difficulty   Pain immediately resolved suggesting accurate placement of the medication.   Advised to call if fevers/chills, erythema, induration, drainage, or  persistent bleeding.   Images permanently stored and available for review in the ultrasound unit.  Impression: Technically successful ultrasound guided injection.    EXAM: MR OF THE RIGHT HIP WITHOUT CONTRAST   TECHNIQUE: Multiplanar, multisequence MR imaging was performed. No intravenous contrast was administered.   COMPARISON:  None Available.   FINDINGS: Bones:   No hip fracture, dislocation or avascular necrosis.   No periosteal reaction or bone destruction. No aggressive osseous lesion.   Normal sacrum and sacroiliac joints. No SI joint widening or erosive changes.   Articular cartilage and labrum   Articular cartilage: Extensive full-thickness cartilage loss of the right femoral head and acetabulum with severe subchondral marrow edema in the acetabulum and femoral head. Mild subchondral cystic changes on either side of the right hip joint.   Labrum: Degenerative tearing of the right anterior superior labrum and superior labrum.   Joint or bursal effusion   Joint effusion: Large right hip joint effusion. No left hip joint effusion. No SI joint effusion.   Bursae:  No bursal fluid.   Muscles and tendons   Flexors: Normal.   Extensors: Normal.   Abductors: Normal.   Adductors: Normal.   Gluteals: Normal.   Hamstrings: Normal.   Other findings   No pelvic free fluid. No fluid collection or hematoma. No inguinal lymphadenopathy. No inguinal hernia. 3.3 cm right ovarian cyst.   IMPRESSION: 1. Severe osteoarthritis of the right hip. Large right hip joint effusion. 2. No hip fracture, dislocation or avascular necrosis. 3. 3.3 cm right ovarian cyst. No follow up imaging recommended. Note: This recommendation  does not apply to premenarchal patients and to those with increased risk (genetic, family history, elevated tumor markers or other high-risk factors) of ovarian cancer. Reference: JACR 2020 Feb; 17(2):248-254     Electronically Signed   By:  Julaine Blanch M.D.   On: 12/18/2023 09:01 I, Artist Lloyd, personally (independently) visualized and performed the interpretation of the images attached in this note.      Assessment and Plan: 78 y.o. female with right anterior and lateral hip pain.  Pain is predominantly due to DJD.  Lateral hip pain harder to explain.  She does not have much bursitis or tendinitis.  Pain is due to referred pain from hip arthritis.  Plan for repeat steroid injection anterior hip joint.  However she ultimately will likely require total hip replacement.  Will refer now to orthopedic surgery to start talking about her options and get her life in order.  She will have to wait at least 3 months after today's injection to proceed with the actual surgery but now is a good time to start talking to surgeons.   PDMP not reviewed this encounter. Orders Placed This Encounter  Procedures   US  LIMITED JOINT SPACE STRUCTURES LOW RIGHT(NO LINKED CHARGES)    Reason for Exam (SYMPTOM  OR DIAGNOSIS REQUIRED):   right hip pain    Preferred imaging location?:   [facility] referral to Orthopedic Surgery    Referral Priority:   Routine    Referral Type:   Surgical    Referral Reason:   Specialty Services Required    Referred to Provider:   Addie Cordella Hamilton, MD    Requested Specialty:   Orthopedic Surgery    Number of Visits Requested:   1   No orders of the defined types were placed in this encounter.    Discussed warning signs or symptoms. Please see discharge instructions. Patient expresses understanding.   The above documentation has been reviewed and is accurate and complete Artist Lloyd, M.D.

## 2024-01-04 NOTE — Patient Instructions (Addendum)
 Thank you for coming in today.   You received an injection today. Seek immediate medical attention if the joint becomes red, extremely painful, or is oozing fluid.   I've referred you to [facility] for a surgical consultation.  Let us  know if you don't hear from them in one week.

## 2024-01-11 ENCOUNTER — Other Ambulatory Visit: Payer: Self-pay | Admitting: Cardiovascular Disease

## 2024-01-11 DIAGNOSIS — I10 Essential (primary) hypertension: Secondary | ICD-10-CM

## 2024-01-13 ENCOUNTER — Other Ambulatory Visit: Payer: Self-pay | Admitting: Family

## 2024-01-13 DIAGNOSIS — D6859 Other primary thrombophilia: Secondary | ICD-10-CM

## 2024-01-13 DIAGNOSIS — I480 Paroxysmal atrial fibrillation: Secondary | ICD-10-CM

## 2024-01-14 NOTE — Telephone Encounter (Signed)
 Eliquis  5mg  refill request received. Patient is 79 years old, weight-71.2kg, Crea-1.13 on 12/07/23, Diagnosis-Afib, and last seen by Reche Finder on 11/19/23. Dose is appropriate based on dosing criteria. Will send in refill to requested pharmacy.

## 2024-01-17 ENCOUNTER — Ambulatory Visit: Admitting: Family Medicine

## 2024-01-27 ENCOUNTER — Ambulatory Visit (INDEPENDENT_AMBULATORY_CARE_PROVIDER_SITE_OTHER)

## 2024-01-27 DIAGNOSIS — Z23 Encounter for immunization: Secondary | ICD-10-CM

## 2024-01-27 NOTE — Progress Notes (Signed)
 Pt is office today for Flu shot, vaccine was given in L deltoid and pt tolerated well.

## 2024-01-28 ENCOUNTER — Ambulatory Visit: Admitting: Orthopedic Surgery

## 2024-01-28 DIAGNOSIS — M1611 Unilateral primary osteoarthritis, right hip: Secondary | ICD-10-CM | POA: Diagnosis not present

## 2024-01-29 ENCOUNTER — Encounter: Payer: Self-pay | Admitting: Orthopedic Surgery

## 2024-01-29 NOTE — Progress Notes (Signed)
 Office Visit Note   Patient: Sharon Knight           Date of Birth: [birth year]           MRN: [id number] Visit Date: 01/28/2024 Requested by: Joane Artist RAMAN, MD [address] [place],  [place] [address] PCP: Geofm Glade PARAS, MD  Subjective: Chief Complaint  Patient presents with   Right Hip - Pain    HPI: Sharon Knight is a 78 y.o. female who presents to the office reporting right hip pain.  She has had prior injections done in May and September.  Does have diabetes with last hemoglobin A1c 6.8.  Does take Eliquis  for atrial fibrillation.  Injections have not been as helpful as they had been previously.  Pain wakes the patient from sleep at night.  Does have a history of having done therapy.  Does have husband at home.  Here with her friend today.  Has 3 steps to get into the house..                ROS: All systems reviewed are negative as they relate to the chief complaint within the history of present illness.  Patient denies fevers or chills.  Assessment & Plan: Visit Diagnoses:  1. Arthritis of right hip     Plan: Impression is end-stage severe right hip arthritis and the patient who has some medical comorbidities as well as advanced age.  The risk and benefits of surgical intervention are discussed with Elveria with the use of models.  Patient understands the risk and benefits and wishes to proceed.  She would need to wait till sometime in December to have her hip done.  We will obtain cardiac restratification prior to surgery and we will also plan to sort out Eliquis  management prior to surgery.  All questions answered.  Follow-Up Instructions: No follow-ups on file.   Orders:  No orders of the defined types were placed in this encounter.  No orders of the defined types were placed in this encounter.     Procedures: No procedures performed   Clinical Data: No additional findings.  Objective: Vital Signs: There were no vitals taken for this visit.  Physical  Exam:  Constitutional: Patient appears well-developed HEENT:  Head: Normocephalic Eyes:EOM are normal Neck: Normal range of motion Cardiovascular: Normal rate Pulmonary/chest: Effort normal Neurologic: Patient is alert Skin: Skin is warm Psychiatric: Patient has normal mood and affect  Ortho Exam: Ortho exam demonstrates slight antalgic gait to the right which is a Trendelenburg gait.  Does have diminished and painful range of motion right hip with passive range of motion.  Pedal pulses palpable.  Ankle dorsiflexion intact.  Leg lengths approximately equal.  Specialty Comments:  No specialty comments available.  Imaging: No results found.   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 03/16/2019   Bilateral low back pain 03/17/2018   History of gout 12/21/2017  Lobar pneumonia 12/10/2017   Paroxysmal atrial fibrillation (HCC) 12/10/2017   History of colonic polyps 10/22/2014   History of nephrectomy, unilateral 11/13/2012   Vitamin D  deficiency 10/31/2012   ECZEMA 03/03/2010   Benign neoplasm of colon 08/02/2007   Hyperlipidemia 08/02/2007   Essential hypertension 08/02/2007   Osteopenia 08/02/2007   Past Medical History:  Diagnosis Date   Arthritis    Bilateral cataracts    Dr Rosan   Exertional dyspnea 12/19/2020   Hyperlipidemia    Hypertension     Family History  Adopted: Yes  Problem Relation Age of  Onset   Colon cancer Neg Hx    Stomach cancer Neg Hx    Breast cancer Neg Hx     Past Surgical History:  Procedure Laterality Date   COLONOSCOPY     Dr Aneita   NEPHRECTOMY     right; for cyst on kidney   OVARIAN CYST REMOVAL     Social History   Occupational History   Not on file  Tobacco Use   Smoking status: Former    Current packs/day: 0.00    Types: Cigarettes    Quit date: 08/27/1984    Years since quitting: 39.4   Smokeless tobacco: Never   Tobacco comments:    smoked age [age], up to 1.5-2 ppd  Vaping Use   Vaping status: Never Used  Substance and Sexual Activity   Alcohol use: Not Currently   Drug use: No   Sexual activity: Not Currently

## 2024-02-02 ENCOUNTER — Telehealth: Payer: Self-pay

## 2024-02-02 NOTE — Telephone Encounter (Signed)
   Pre-operative Risk Assessment    Patient Name: Sharon Knight  DOB: 10-May-1945 MRN: [id number]   Date of last office visit: 11/19/23 Date of next office visit: n/a   Request for Surgical Clearance    Procedure:  Right total hip arthroplasty   Date of Surgery:  Clearance TBD Mid December 2025                              Surgeon:  Glendia Hutchinson, MD  Surgeon's Group or Practice Name:  [facility] number:  [phone] Fax number:  [phone]   Type of Clearance Requested:   - Medical  - Pharmacy:  Hold Apixaban  (Eliquis ) not indicated    Type of Anesthesia:  Spinal   Additional requests/questions:    Bonney Rebeca Blight   02/02/2024, 10:29 AM

## 2024-02-08 ENCOUNTER — Other Ambulatory Visit: Payer: Self-pay | Admitting: Internal Medicine

## 2024-02-08 DIAGNOSIS — Z1231 Encounter for screening mammogram for malignant neoplasm of breast: Secondary | ICD-10-CM

## 2024-02-14 ENCOUNTER — Telehealth (HOSPITAL_BASED_OUTPATIENT_CLINIC_OR_DEPARTMENT_OTHER): Payer: Self-pay | Admitting: *Deleted

## 2024-02-14 NOTE — Telephone Encounter (Signed)
 Patient with diagnosis of Afib on Eliquis  for anticoagulation.    Procedure:  Right total hip arthroplasty  Date of procedure: TBD   CHA2DS2-VASc Score = 5   This indicates a 7.2% annual risk of stroke. The patient's score is based upon: CHF History: 0 HTN History: 1 Diabetes History: 1 Stroke History: 0 Vascular Disease History: 0 Age Score: 2 Gender Score: 1    CrCl 38 mL/min Platelet count 359 K   Patient has not  had an Afib/aflutter ablation in the last 3 months, DCCV within the last 4 weeks or a watchman implanted in the last 45 days     Per office protocol, patient can hold Eliquis  for 3 days prior to procedure.   Patient will not need bridging with Lovenox  (enoxaparin ) around procedure.  **This guidance is not considered finalized until pre-operative APP has relayed final recommendations.**

## 2024-02-14 NOTE — Telephone Encounter (Signed)
 Left a message for pt to call back to schedule tele preop appt. Looks like surgery planned for possible mid dec 2025, see clearance request noted. If so tele to be mid to late Nov 2025.

## 2024-02-14 NOTE — Telephone Encounter (Signed)
 Pt has been tele preop appt 03/20/24. Med rec and consent are done.     Patient Consent for Virtual Visit        Sharon Knight has provided verbal consent on 02/14/2024 for a virtual visit (video or telephone).   CONSENT FOR VIRTUAL VISIT FOR:  Sharon Knight  By participating in this virtual visit I agree to the following:  I hereby voluntarily request, consent and authorize [facility] and its employed or contracted physicians, physician assistants, nurse practitioners or other licensed health care professionals (the Practitioner), to provide me with telemedicine health care services (the "Services) as deemed necessary by the treating Practitioner. I acknowledge and consent to receive the Services by the Practitioner via telemedicine. I understand that the telemedicine visit will involve communicating with the Practitioner through live audiovisual communication technology and the disclosure of certain medical information by electronic transmission. I acknowledge that I have been given the opportunity to request an in-person assessment or other available alternative prior to the telemedicine visit and am voluntarily participating in the telemedicine visit.  I understand that I have the right to withhold or withdraw my consent to the use of telemedicine in the course of my care at any time, without affecting my right to future care or treatment, and that the Practitioner or I may terminate the telemedicine visit at any time. I understand that I have the right to inspect all information obtained and/or recorded in the course of the telemedicine visit and may receive copies of available information for a reasonable fee.  I understand that some of the potential risks of receiving the Services via telemedicine include:  Delay or interruption in medical evaluation due to technological equipment failure or disruption; Information transmitted may not be sufficient (e.g. poor resolution of  images) to allow for appropriate medical decision making by the Practitioner; and/or  In rare instances, security protocols could fail, causing a breach of personal health information.  Furthermore, I acknowledge that it is my responsibility to provide information about my medical history, conditions and care that is complete and accurate to the best of my ability. I acknowledge that Practitioner's advice, recommendations, and/or decision may be based on factors not within their control, such as incomplete or inaccurate data provided by me or distortions of diagnostic images or specimens that may result from electronic transmissions. I understand that the practice of medicine is not an exact science and that Practitioner makes no warranties or guarantees regarding treatment outcomes. I acknowledge that a copy of this consent can be made available to me via my patient portal [facility] MyChart), or I can request a printed copy by calling the office of [facility].    I understand that my insurance will be billed for this visit.   I have read or had this consent read to me. I understand the contents of this consent, which adequately explains the benefits and risks of the Services being provided via telemedicine.  I have been provided ample opportunity to ask questions regarding this consent and the Services and have had my questions answered to my satisfaction. I give my informed consent for the services to be provided through the use of telemedicine in my medical care

## 2024-02-14 NOTE — Telephone Encounter (Signed)
   Name: Sharon Knight  DOB: 1946/03/07  MRN: [id number]  Primary Cardiologist: Annabella Scarce, MD  Chart reviewed as part of pre-operative protocol coverage. Because of Sharon Knight's past medical history and time since last visit, she will require a follow-up telephone visit in order to better assess preoperative cardiovascular risk.  Pre-op covering staff: - Please schedule appointment and call patient to inform them. If patient already had an upcoming appointment within acceptable timeframe, please add pre-op clearance to the appointment notes so provider is aware. - Please contact requesting surgeon's office via preferred method (i.e, phone, fax) to inform them of need for appointment prior to surgery.  Per office protocol, patient can hold Eliquis  for 3 days prior to procedure.  Please resume when medically safe to do so. Patient will not need bridging with Lovenox  (enoxaparin ) around procedure.   Sharon LOISE Fabry, PA-C  02/14/2024, 9:00 AM

## 2024-02-14 NOTE — Telephone Encounter (Signed)
 Pt has been tele preop appt 03/20/24. Med rec and consent are done.

## 2024-02-14 NOTE — Telephone Encounter (Signed)
 Pt calling is returning a call to the nurse

## 2024-02-21 DIAGNOSIS — L821 Other seborrheic keratosis: Secondary | ICD-10-CM | POA: Diagnosis not present

## 2024-02-21 DIAGNOSIS — L814 Other melanin hyperpigmentation: Secondary | ICD-10-CM | POA: Diagnosis not present

## 2024-02-21 DIAGNOSIS — Z08 Encounter for follow-up examination after completed treatment for malignant neoplasm: Secondary | ICD-10-CM | POA: Diagnosis not present

## 2024-02-21 DIAGNOSIS — D225 Melanocytic nevi of trunk: Secondary | ICD-10-CM | POA: Diagnosis not present

## 2024-03-15 ENCOUNTER — Other Ambulatory Visit (HOSPITAL_COMMUNITY): Payer: Self-pay

## 2024-03-16 ENCOUNTER — Other Ambulatory Visit (HOSPITAL_COMMUNITY): Payer: Self-pay

## 2024-03-16 MED FILL — Apixaban Tab 5 MG: ORAL | 30 days supply | Qty: 60 | Fill #0 | Status: AC

## 2024-03-17 ENCOUNTER — Other Ambulatory Visit: Payer: Self-pay

## 2024-03-18 ENCOUNTER — Other Ambulatory Visit (HOSPITAL_COMMUNITY): Payer: Self-pay

## 2024-03-20 ENCOUNTER — Ambulatory Visit: Attending: Cardiovascular Disease

## 2024-03-20 DIAGNOSIS — Z01810 Encounter for preprocedural cardiovascular examination: Secondary | ICD-10-CM | POA: Diagnosis not present

## 2024-03-20 NOTE — Progress Notes (Signed)
 Virtual Visit via Telephone Note   Because of SHERILYN WINDHORST co-morbid illnesses, she is at least at moderate risk for complications without adequate follow up.  This format is felt to be most appropriate for this patient at this time.  Due to technical limitations with video connection web designer), today's appointment will be conducted as an audio only telehealth visit, and LOY LITTLE verbally agreed to proceed in this manner.   All issues noted in this document were discussed and addressed.  No physical exam could be performed with this format.  Evaluation Performed:  Preoperative cardiovascular risk assessment _____________   Date:  03/20/2024   Patient ID:  Sharon Knight, DOB 1946/03/27, MRN [id number] Patient Location:  Home Provider location:   Office  Primary Care Provider:  Geofm Glade PARAS, MD Primary Cardiologist:  Annabella Scarce, MD  Chief Complaint / Patient Profile   78 y.o. y/o female with a h/o paroxysmal atrial fibrillation, essential hypertension, type 2 diabetes who is pending right total hip arthroplasty and presents today for telephonic preoperative cardiovascular risk assessment.  History of Present Illness    Sharon Knight is a 78 y.o. female who presents via audio/video conferencing for a telehealth visit today.  Pt was last seen in cardiology clinic on 11/19/2023 by Reche Finder, NP-C.  At that time BEVELY HACKBART was doing well .  The patient is now pending procedure as outlined above. Since her last visit, she remains stable from a cardiac standpoint.  Today she denies chest pain, shortness of breath, lower extremity edema, fatigue, palpitations, melena, hematuria, hemoptysis, diaphoresis, weakness, presyncope, syncope, orthopnea, and PND.   Past Medical History    Past Medical History:  Diagnosis Date   Arthritis    Bilateral cataracts    Dr Rosan   Exertional dyspnea 12/19/2020   Hyperlipidemia    Hypertension    Past Surgical  History:  Procedure Laterality Date   COLONOSCOPY     Dr Aneita   NEPHRECTOMY     right; for cyst on kidney   OVARIAN CYST REMOVAL      Allergies  Allergies  Allergen Reactions   Ibuprofen Other (See Comments)    Dizziness made me woozy    Home Medications    Prior to Admission medications   Medication Sig Start Date End Date Taking? Authorizing Provider  atorvastatin  (LIPITOR) 10 MG tablet Take 1 tablet (10 mg total) by mouth daily. 11/19/23   Finder Reche RAMAN, NP  diltiazem  (CARTIA  XT) 180 MG 24 hr capsule Take 1 capsule (180 mg total) by mouth daily. 11/19/23   Finder Reche RAMAN, NP  Docusate Sodium (STOOL SOFTENER LAXATIVE PO) Take 3-4 capsules by mouth daily.    [provider]  apixaban  (ELIQUIS ) 5 MG TABS tablet TAKE ONE TABLET BY MOUTH TWICE DAILY 01/14/24   Walker, Caitlin S, NP  flecainide  (TAMBOCOR ) 100 MG tablet Take 1 tablet (100 mg total) by mouth 2 (two) times daily. Please call office to schedule an appt for further refills. Thank you 11/19/23   Finder Reche RAMAN, NP  hydrochlorothiazide  (HYDRODIURIL ) 25 MG tablet TAKE ONE TABLET BY MOUTH ONCE DAILY 01/12/24   Scarce Annabella, MD  magnesium 30 MG tablet Take 30 mg by mouth 2 (two) times daily.    [provider]  Misc Natural Products (TART CHERRY ADVANCED PO) Take 2 capsules by mouth 2 (two) times daily.    [provider]  olmesartan  (BENICAR ) 5 MG tablet Take 1 tablet (5  mg total) by mouth daily. 11/19/23   Vannie Reche RAMAN, NP    Physical Exam    Vital Signs:  CLEOTA PELLERITO does not have vital signs available for review today.  Given telephonic nature of communication, physical exam is limited. AAOx3. NAD. Normal affect.  Speech and respirations are unlabored.  Accessory Clinical Findings    None  Assessment & Plan    1.  Preoperative Cardiovascular Risk Assessment: Right total hip arthroplasty    Date of Surgery:  Clearance TBD Mid December 2025                                Surgeon:  Glendia Hutchinson, MD  Surgeon's Group or Practice Name:  [facility] number:  [phone] Fax number:  [phone]    Primary Cardiologist: Annabella Scarce, MD  Chart reviewed as part of pre-operative protocol coverage. Given past medical history and time since last visit, based on ACC/AHA guidelines, Zayli B Reaney would be at acceptable risk for the planned procedure without further cardiovascular testing.     Patient was advised that if she develops new symptoms prior to surgery to contact our office to arrange a follow-up appointment.  She verbalized understanding.  Per office protocol, patient can hold Eliquis  for 3 days prior to procedure.  Please resume when medically safe to do so. Patient will not need bridging with Lovenox  (enoxaparin ) around procedure.  I will route this recommendation to the requesting party via Epic fax function and remove from pre-op pool.       Time:   Today, I have spent  10 minutes with the patient with telehealth technology discussing medical history, symptoms, and management plan.     Josefa CHRISTELLA Beauvais, NP  03/20/2024, 7:18 AM

## 2024-03-23 ENCOUNTER — Ambulatory Visit
Admission: RE | Admit: 2024-03-23 | Discharge: 2024-03-23 | Disposition: A | Source: Ambulatory Visit | Attending: Internal Medicine

## 2024-03-23 DIAGNOSIS — Z1231 Encounter for screening mammogram for malignant neoplasm of breast: Secondary | ICD-10-CM

## 2024-04-07 ENCOUNTER — Encounter (HOSPITAL_COMMUNITY): Payer: Self-pay

## 2024-04-07 NOTE — Progress Notes (Addendum)
 Surgical Instructions   Your procedure is scheduled on Thursday, December 11th, 2025. Report to [facility] Main Entrance A at 5:30 A.M., then check in with the Admitting office. Any questions or running late day of surgery: call [phone]  Questions prior to your surgery date: call [phone], Monday-Friday, 8am-4pm. If you experience any cold or flu symptoms such as cough, fever, chills, shortness of breath, etc. between now and your scheduled surgery, please notify us  at the above number.     Remember:  Do not eat after midnight the night before your surgery  You may drink clear liquids until 4:30 the morning of your surgery.   Clear liquids allowed are: Water, Non-Citrus Juices (without pulp), Carbonated Beverages, Clear Tea (no milk, honey, etc.), Black Coffee Only (NO MILK, CREAM OR POWDERED CREAMER of any kind), and Gatorade.  Patient Instructions  The night before surgery:  No food after midnight. ONLY clear liquids after midnight   The day of surgery (if you have diabetes): Drink ONE (1) 12 oz G2 given to you in your pre admission testing appointment by 4:30 the morning of surgery. Drink in one sitting. Do not sip.  This drink was given to you during your hospital  pre-op appointment visit.  Nothing else to drink after completing the  12 oz bottle of G2.         If you have questions, please contact your surgeon's office.     Take these medicines the morning of surgery with A SIP OF WATER: Atorvastatin  (Lipitor) Diltiazem  (Cartia  XT) Flecainide  (Tambocor ) Docusate Sodium    May take these medicines IF NEEDED: Acetaminophen  (Tylenol )   Per your cardiologist, Apixaban  (Eliquis ) should be held for 3 days prior your procedure.  Your last dose of Apixaban  (Eliquis ) should be on Sunday, December 7th.     One week prior to surgery, STOP taking any Aspirin (unless otherwise instructed by your surgeon) Aleve, Naproxen, Ibuprofen, Motrin, Advil, Goody's, BC's, all  herbal medications, fish oil, and non-prescription vitamins.     HOW TO MANAGE YOUR DIABETES BEFORE AND AFTER SURGERY  Why is it important to control my blood sugar before and after surgery? Improving blood sugar levels before and after surgery helps healing and can limit problems. A way of improving blood sugar control is eating a healthy diet by:  Eating less sugar and carbohydrates  Increasing activity/exercise  Talking with your doctor about reaching your blood sugar goals High blood sugars (greater than 180 mg/dL) can raise your risk of infections and slow your recovery, so you will need to focus on controlling your diabetes during the weeks before surgery. Make sure that the doctor who takes care of your diabetes knows about your planned surgery including the date and location.  How do I manage my blood sugar before surgery? Check your blood sugar at least 4 times a day, starting 2 days before surgery, to make sure that the level is not too high or low.  Check your blood sugar the morning of your surgery when you wake up and every 2 hours until you get to the Short Stay unit.  If your blood sugar is less than 70 mg/dL, you will need to treat for low blood sugar: Do not take insulin. Treat a low blood sugar (less than 70 mg/dL) with  cup of clear juice (cranberry or apple), 4 glucose tablets, OR glucose gel. Recheck blood sugar in 15 minutes after treatment (to make sure it is greater than 70 mg/dL). If your  blood sugar is not greater than 70 mg/dL on recheck, call [phone] for further instructions. Report your blood sugar to the short stay nurse when you get to Short Stay.  If you are admitted to the hospital after surgery: Your blood sugar will be checked by the staff and you will probably be given insulin after surgery (instead of oral diabetes medicines) to make sure you have good blood sugar levels. The goal for blood sugar control after surgery is 80-180 mg/dL.                       Do NOT Smoke (Tobacco/Vaping) for 24 hours prior to your procedure.  If you use a CPAP at night, you may bring your mask/headgear for your overnight stay.   You will be asked to remove any contacts, glasses, piercing's, hearing aid's, dentures/partials prior to surgery. Please bring cases for these items if needed.    Patients discharged the day of surgery will not be allowed to drive home, and someone needs to stay with them for 24 hours.  SURGICAL WAITING ROOM VISITATION Patients may have no more than 2 support people in the waiting area - these visitors may rotate.   Pre-op nurse will coordinate an appropriate time for 1 ADULT support person, who may not rotate, to accompany patient in pre-op.  Children under the age of 34 must have an adult with them who is not the patient and must remain in the main waiting area with an adult.  If the patient needs to stay at the hospital during part of their recovery, the visitor guidelines for inpatient rooms apply.  Please refer to the [facility] website for the visitor guidelines for any additional information.   If you received a COVID test during your pre-op visit  it is requested that you wear a mask when out in public, stay away from anyone that may not be feeling well and notify your surgeon if you develop symptoms. If you have been in contact with anyone that has tested positive in the last 10 days please notify you surgeon.      Pre-operative 4 CHG Bathing Instructions   You can play a key role in reducing the risk of infection after surgery. Your skin needs to be as free of germs as possible. You can reduce the number of germs on your skin by washing with CHG (chlorhexidine gluconate) soap before surgery. CHG is an antiseptic soap that kills germs and continues to kill germs even after washing.   DO NOT use if you have an allergy to chlorhexidine/CHG or antibacterial soaps. If your skin becomes reddened or irritated, stop  using the CHG and notify one of our RNs at [phone].   Please shower with the CHG soap starting 4 days before surgery using the following schedule:     Please keep in mind the following:  DO NOT shave, including legs and underarms, starting the day of your first shower.   You may shave your face at any point before/day of surgery.  Place clean sheets on your bed the day you start using CHG soap. Use a clean washcloth (not used since being washed) for each shower. DO NOT sleep with pets once you start using the CHG.   CHG Shower Instructions:  Wash your face and private area with normal soap. If you choose to wash your hair, wash first with your normal shampoo.  After you use shampoo/soap, rinse your hair and body thoroughly  to remove shampoo/soap residue.  Turn the water OFF and apply  bottle of CHG soap to a CLEAN washcloth.  Apply CHG soap ONLY FROM YOUR NECK DOWN TO YOUR TOES (washing for 3-5 minutes)  DO NOT use CHG soap on face, private areas, open wounds, or sores.  Pay special attention to the area where your surgery is being performed.  If you are having back surgery, having someone wash your back for you may be helpful. Wait 2 minutes after CHG soap is applied, then you may rinse off the CHG soap.  Pat dry with a clean towel  Put on clean clothes/pajamas   If you choose to wear lotion, please use ONLY the CHG-compatible lotions that are listed below.  Additional instructions for the day of surgery:  If you choose, you may shower the morning of surgery with an antibacterial soap.  DO NOT APPLY any lotions, deodorants, cologne, or perfumes.   Do not bring valuables to the hospital. [facility] is not responsible for any belongings/valuables. Do not wear nail polish, gel polish, artificial nails, or any other type of covering on natural nails (fingers and toes) Do not wear jewelry or makeup Put on clean/comfortable clothes.  Please brush your teeth.  Ask your nurse  before applying any prescription medications to the skin.     CHG Compatible Lotions   Aveeno Moisturizing lotion  Cetaphil Moisturizing Cream  Cetaphil Moisturizing Lotion  Clairol Herbal Essence Moisturizing Lotion, Dry Skin  Clairol Herbal Essence Moisturizing Lotion, Extra Dry Skin  Clairol Herbal Essence Moisturizing Lotion, Normal Skin  Curel Age Defying Therapeutic Moisturizing Lotion with Alpha Hydroxy  Curel Extreme Care Body Lotion  Curel Soothing Hands Moisturizing Hand Lotion  Curel Therapeutic Moisturizing Cream, Fragrance-Free  Curel Therapeutic Moisturizing Lotion, Fragrance-Free  Curel Therapeutic Moisturizing Lotion, Original Formula  Eucerin Daily Replenishing Lotion  Eucerin Dry Skin Therapy Plus Alpha Hydroxy Crme  Eucerin Dry Skin Therapy Plus Alpha Hydroxy Lotion  Eucerin Original Crme  Eucerin Original Lotion  Eucerin Plus Crme Eucerin Plus Lotion  Eucerin TriLipid Replenishing Lotion  Keri Anti-Bacterial Hand Lotion  Keri Deep Conditioning Original Lotion Dry Skin Formula Softly Scented  Keri Deep Conditioning Original Lotion, Fragrance Free Sensitive Skin Formula  Keri Lotion Fast Absorbing Fragrance Free Sensitive Skin Formula  Keri Lotion Fast Absorbing Softly Scented Dry Skin Formula  Keri Original Lotion  Keri Skin Renewal Lotion Keri Silky Smooth Lotion  Keri Silky Smooth Sensitive Skin Lotion  Nivea Body Creamy Conditioning Oil  Nivea Body Extra Enriched Lotion  Nivea Body Original Lotion  Nivea Body Sheer Moisturizing Lotion Nivea Crme  Nivea Skin Firming Lotion  NutraDerm 30 Skin Lotion  NutraDerm Skin Lotion  NutraDerm Therapeutic Skin Cream  NutraDerm Therapeutic Skin Lotion  ProShield Protective Hand Cream  Provon moisturizing lotion  Please read over the following fact sheets that you were given.

## 2024-04-10 ENCOUNTER — Inpatient Hospital Stay (HOSPITAL_COMMUNITY): Admission: RE | Admit: 2024-04-10 | Discharge: 2024-04-10 | Attending: Orthopedic Surgery

## 2024-04-10 ENCOUNTER — Encounter (HOSPITAL_COMMUNITY): Payer: Self-pay

## 2024-04-10 ENCOUNTER — Other Ambulatory Visit: Payer: Self-pay

## 2024-04-10 VITALS — BP 137/56 | HR 62 | Temp 98.4°F | Resp 20 | Ht 63.0 in | Wt 160.4 lb

## 2024-04-10 DIAGNOSIS — Z85828 Personal history of other malignant neoplasm of skin: Secondary | ICD-10-CM | POA: Diagnosis not present

## 2024-04-10 DIAGNOSIS — I4891 Unspecified atrial fibrillation: Secondary | ICD-10-CM | POA: Diagnosis not present

## 2024-04-10 DIAGNOSIS — E119 Type 2 diabetes mellitus without complications: Secondary | ICD-10-CM | POA: Diagnosis not present

## 2024-04-10 DIAGNOSIS — R0609 Other forms of dyspnea: Secondary | ICD-10-CM | POA: Diagnosis not present

## 2024-04-10 DIAGNOSIS — Z87891 Personal history of nicotine dependence: Secondary | ICD-10-CM | POA: Diagnosis not present

## 2024-04-10 DIAGNOSIS — Z01812 Encounter for preprocedural laboratory examination: Secondary | ICD-10-CM | POA: Diagnosis not present

## 2024-04-10 DIAGNOSIS — Z01818 Encounter for other preprocedural examination: Secondary | ICD-10-CM | POA: Diagnosis not present

## 2024-04-10 DIAGNOSIS — E785 Hyperlipidemia, unspecified: Secondary | ICD-10-CM | POA: Diagnosis not present

## 2024-04-10 DIAGNOSIS — Z905 Acquired absence of kidney: Secondary | ICD-10-CM | POA: Diagnosis not present

## 2024-04-10 DIAGNOSIS — I10 Essential (primary) hypertension: Secondary | ICD-10-CM | POA: Diagnosis not present

## 2024-04-10 DIAGNOSIS — M1611 Unilateral primary osteoarthritis, right hip: Secondary | ICD-10-CM | POA: Diagnosis not present

## 2024-04-10 DIAGNOSIS — Z7901 Long term (current) use of anticoagulants: Secondary | ICD-10-CM | POA: Diagnosis not present

## 2024-04-10 HISTORY — DX: Type 2 diabetes mellitus without complications: E11.9

## 2024-04-10 HISTORY — DX: Chronic kidney disease, unspecified: N18.9

## 2024-04-10 HISTORY — DX: Unspecified atrial fibrillation: I48.91

## 2024-04-10 HISTORY — DX: Pneumonia, unspecified organism: J18.9

## 2024-04-10 HISTORY — DX: Malignant (primary) neoplasm, unspecified: C80.1

## 2024-04-10 LAB — HEMOGLOBIN A1C
Hgb A1c MFr Bld: 6.3 % — ABNORMAL HIGH (ref 4.8–5.6)
Mean Plasma Glucose: 134.11 mg/dL

## 2024-04-10 LAB — URINALYSIS, W/ REFLEX TO CULTURE (INFECTION SUSPECTED)
Bacteria, UA: NONE SEEN
Bilirubin Urine: NEGATIVE
Glucose, UA: NEGATIVE mg/dL
Hgb urine dipstick: NEGATIVE
Ketones, ur: NEGATIVE mg/dL
Nitrite: NEGATIVE
Protein, ur: NEGATIVE mg/dL
Specific Gravity, Urine: 1.012 (ref 1.005–1.030)
pH: 6 (ref 5.0–8.0)

## 2024-04-10 LAB — BASIC METABOLIC PANEL WITH GFR
Anion gap: 9 (ref 5–15)
BUN: 23 mg/dL (ref 8–23)
CO2: 30 mmol/L (ref 22–32)
Calcium: 9.4 mg/dL (ref 8.9–10.3)
Chloride: 102 mmol/L (ref 98–111)
Creatinine, Ser: 1.06 mg/dL — ABNORMAL HIGH (ref 0.44–1.00)
GFR, Estimated: 53 mL/min — ABNORMAL LOW (ref >60)
Glucose, Bld: 144 mg/dL — ABNORMAL HIGH (ref 70–99)
Potassium: 3.7 mmol/L (ref 3.5–5.1)
Sodium: 141 mmol/L (ref 135–145)

## 2024-04-10 LAB — CBC
HCT: 40.1 % (ref 36.0–46.0)
Hemoglobin: 13.5 g/dL (ref 12.0–15.0)
MCH: 31.1 pg (ref 26.0–34.0)
MCHC: 33.7 g/dL (ref 30.0–36.0)
MCV: 92.4 fL (ref 80.0–100.0)
Platelets: 331 K/uL (ref 150–400)
RBC: 4.34 MIL/uL (ref 3.87–5.11)
RDW: 12.8 % (ref 11.5–15.5)
WBC: 9.5 K/uL (ref 4.0–10.5)
nRBC: 0 % (ref 0.0–0.2)

## 2024-04-10 LAB — TYPE AND SCREEN
ABO/RH(D): O NEG
Antibody Screen: NEGATIVE

## 2024-04-10 LAB — SURGICAL PCR SCREEN
MRSA, PCR: NEGATIVE
Staphylococcus aureus: NEGATIVE

## 2024-04-10 LAB — GLUCOSE, CAPILLARY: Glucose-Capillary: 160 mg/dL — ABNORMAL HIGH (ref 70–99)

## 2024-04-10 NOTE — Progress Notes (Signed)
 Surgical Instructions   Your procedure is scheduled on Thursday, December 11th, 2025. Report to [facility] Main Entrance A at 5:30 A.M., then check in with the Admitting office. Any questions or running late day of surgery: call [phone]  Questions prior to your surgery date: call [phone], Monday-Friday, 8am-4pm. If you experience any cold or flu symptoms such as cough, fever, chills, shortness of breath, etc. between now and your scheduled surgery, please notify us  at the above number.     Remember:  Do not eat after midnight the night before your surgery  You may drink clear liquids until 4:30 the morning of your surgery.   Clear liquids allowed are: Water, Non-Citrus Juices (without pulp), Carbonated Beverages, Clear Tea (no milk, honey, etc.), Black Coffee Only (NO MILK, CREAM OR POWDERED CREAMER of any kind), and Gatorade.  Patient Instructions  The night before surgery:  No food after midnight. ONLY clear liquids after midnight   The day of surgery (if you have diabetes): Drink ONE (1) 12 oz G2 given to you in your pre admission testing appointment by 4:30 the morning of surgery. Drink in one sitting. Do not sip.  This drink was given to you during your hospital  pre-op appointment visit.  Nothing else to drink after completing the  12 oz bottle of G2.         If you have questions, please contact your surgeon's office.     Take these medicines the morning of surgery with A SIP OF WATER: Atorvastatin  (Lipitor) Diltiazem  (Cartia  XT) Flecainide  (Tambocor ) Docusate Sodium    May take these medicines IF NEEDED: Acetaminophen  (Tylenol )   Per your cardiologist, Apixaban  (Eliquis ) should be held for 3 days prior your procedure.  Your last dose of Apixaban  (Eliquis ) should be on Sunday, December 7th.     One week prior to surgery, STOP taking any Aspirin (unless otherwise instructed by your surgeon) Aleve, Naproxen, Ibuprofen, Motrin, Advil, Goody's, BC's, all  herbal medications, fish oil, and non-prescription vitamins.     HOW TO MANAGE YOUR DIABETES BEFORE AND AFTER SURGERY  Why is it important to control my blood sugar before and after surgery? Improving blood sugar levels before and after surgery helps healing and can limit problems. A way of improving blood sugar control is eating a healthy diet by:  Eating less sugar and carbohydrates  Increasing activity/exercise  Talking with your doctor about reaching your blood sugar goals High blood sugars (greater than 180 mg/dL) can raise your risk of infections and slow your recovery, so you will need to focus on controlling your diabetes during the weeks before surgery. Make sure that the doctor who takes care of your diabetes knows about your planned surgery including the date and location.  How do I manage my blood sugar before surgery? Check your blood sugar at least 4 times a day, starting 2 days before surgery, to make sure that the level is not too high or low.  Check your blood sugar the morning of your surgery when you wake up and every 2 hours until you get to the Short Stay unit.  If your blood sugar is less than 70 mg/dL, you will need to treat for low blood sugar: Do not take insulin. Treat a low blood sugar (less than 70 mg/dL) with  cup of clear juice (cranberry or apple), 4 glucose tablets, OR glucose gel. Recheck blood sugar in 15 minutes after treatment (to make sure it is greater than 70 mg/dL). If your  blood sugar is not greater than 70 mg/dL on recheck, call [phone] for further instructions. Report your blood sugar to the short stay nurse when you get to Short Stay.  If you are admitted to the hospital after surgery: Your blood sugar will be checked by the staff and you will probably be given insulin after surgery (instead of oral diabetes medicines) to make sure you have good blood sugar levels. The goal for blood sugar control after surgery is 80-180 mg/dL.                       Do NOT Smoke (Tobacco/Vaping) for 24 hours prior to your procedure.  If you use a CPAP at night, you may bring your mask/headgear for your overnight stay.   You will be asked to remove any contacts, glasses, piercing's, hearing aid's, dentures/partials prior to surgery. Please bring cases for these items if needed.    Patients discharged the day of surgery will not be allowed to drive home, and someone needs to stay with them for 24 hours.  SURGICAL WAITING ROOM VISITATION Patients may have no more than 2 support people in the waiting area - these visitors may rotate.   Pre-op nurse will coordinate an appropriate time for 1 ADULT support person, who may not rotate, to accompany patient in pre-op.  Children under the age of 34 must have an adult with them who is not the patient and must remain in the main waiting area with an adult.  If the patient needs to stay at the hospital during part of their recovery, the visitor guidelines for inpatient rooms apply.  Please refer to the [facility] website for the visitor guidelines for any additional information.   If you received a COVID test during your pre-op visit  it is requested that you wear a mask when out in public, stay away from anyone that may not be feeling well and notify your surgeon if you develop symptoms. If you have been in contact with anyone that has tested positive in the last 10 days please notify you surgeon.      Pre-operative 4 CHG Bathing Instructions   You can play a key role in reducing the risk of infection after surgery. Your skin needs to be as free of germs as possible. You can reduce the number of germs on your skin by washing with CHG (chlorhexidine gluconate) soap before surgery. CHG is an antiseptic soap that kills germs and continues to kill germs even after washing.   DO NOT use if you have an allergy to chlorhexidine/CHG or antibacterial soaps. If your skin becomes reddened or irritated, stop  using the CHG and notify one of our RNs at [phone].   Please shower with the CHG soap starting 4 days before surgery using the following schedule:     Please keep in mind the following:  DO NOT shave, including legs and underarms, starting the day of your first shower.   You may shave your face at any point before/day of surgery.  Place clean sheets on your bed the day you start using CHG soap. Use a clean washcloth (not used since being washed) for each shower. DO NOT sleep with pets once you start using the CHG.   CHG Shower Instructions:  Wash your face and private area with normal soap. If you choose to wash your hair, wash first with your normal shampoo.  After you use shampoo/soap, rinse your hair and body thoroughly  to remove shampoo/soap residue.  Turn the water OFF and apply  bottle of CHG soap to a CLEAN washcloth.  Apply CHG soap ONLY FROM YOUR NECK DOWN TO YOUR TOES (washing for 3-5 minutes)  DO NOT use CHG soap on face, private areas, open wounds, or sores.  Pay special attention to the area where your surgery is being performed.  If you are having back surgery, having someone wash your back for you may be helpful. Wait 2 minutes after CHG soap is applied, then you may rinse off the CHG soap.  Pat dry with a clean towel  Put on clean clothes/pajamas   If you choose to wear lotion, please use ONLY the CHG-compatible lotions that are listed below.  Additional instructions for the day of surgery:  If you choose, you may shower the morning of surgery with an antibacterial soap.  DO NOT APPLY any lotions, deodorants, cologne, or perfumes.   Do not bring valuables to the hospital. [facility] is not responsible for any belongings/valuables. Do not wear nail polish, gel polish, artificial nails, or any other type of covering on natural nails (fingers and toes) Do not wear jewelry or makeup Put on clean/comfortable clothes.  Please brush your teeth.  Ask your nurse  before applying any prescription medications to the skin.     CHG Compatible Lotions   Aveeno Moisturizing lotion  Cetaphil Moisturizing Cream  Cetaphil Moisturizing Lotion  Clairol Herbal Essence Moisturizing Lotion, Dry Skin  Clairol Herbal Essence Moisturizing Lotion, Extra Dry Skin  Clairol Herbal Essence Moisturizing Lotion, Normal Skin  Curel Age Defying Therapeutic Moisturizing Lotion with Alpha Hydroxy  Curel Extreme Care Body Lotion  Curel Soothing Hands Moisturizing Hand Lotion  Curel Therapeutic Moisturizing Cream, Fragrance-Free  Curel Therapeutic Moisturizing Lotion, Fragrance-Free  Curel Therapeutic Moisturizing Lotion, Original Formula  Eucerin Daily Replenishing Lotion  Eucerin Dry Skin Therapy Plus Alpha Hydroxy Crme  Eucerin Dry Skin Therapy Plus Alpha Hydroxy Lotion  Eucerin Original Crme  Eucerin Original Lotion  Eucerin Plus Crme Eucerin Plus Lotion  Eucerin TriLipid Replenishing Lotion  Keri Anti-Bacterial Hand Lotion  Keri Deep Conditioning Original Lotion Dry Skin Formula Softly Scented  Keri Deep Conditioning Original Lotion, Fragrance Free Sensitive Skin Formula  Keri Lotion Fast Absorbing Fragrance Free Sensitive Skin Formula  Keri Lotion Fast Absorbing Softly Scented Dry Skin Formula  Keri Original Lotion  Keri Skin Renewal Lotion Keri Silky Smooth Lotion  Keri Silky Smooth Sensitive Skin Lotion  Nivea Body Creamy Conditioning Oil  Nivea Body Extra Enriched Lotion  Nivea Body Original Lotion  Nivea Body Sheer Moisturizing Lotion Nivea Crme  Nivea Skin Firming Lotion  NutraDerm 30 Skin Lotion  NutraDerm Skin Lotion  NutraDerm Therapeutic Skin Cream  NutraDerm Therapeutic Skin Lotion  ProShield Protective Hand Cream  Provon moisturizing lotion  Please read over the following fact sheets that you were given.

## 2024-04-10 NOTE — Progress Notes (Signed)
 PCP - Dr Glade Hope Cardiologist - Dr Annabella Scarce [facility]ph[facility]- [facility].   PPM/ICD - denies   Chest x-ray - N/A EKG - 11/19/23 Stress Test - 12/31/20 ECHO - 12/11/17 Cardiac Cath - denies   Sleep Study - denies   Fasting Blood Sugar - Pt does not check CBG at home and does not have way to check blood sugar. CBG 161 today. A1c drawn today.   Last dose of GLP1 agonist-  N/A   Blood Thinner Instructions: Eliquis - last dose 04/09/24 PM Aspirin Instructions: N/A  ERAS Protcol - ERAS + G2  COVID TEST- n/a   Anesthesia review: cardiac clearance in epic  Patient denies shortness of breath, fever, cough and chest pain at PAT appointment   All instructions explained to the patient, with a verbal understanding of the material. Patient agrees to go over the instructions while at home for a better understanding.  The opportunity to ask questions was provided.

## 2024-04-11 NOTE — Anesthesia Preprocedure Evaluation (Signed)
 Anesthesia Evaluation    Airway        Dental   Pulmonary former smoker          Cardiovascular hypertension,      Neuro/Psych    GI/Hepatic   Endo/Other  diabetes    Renal/GU      Musculoskeletal   Abdominal   Peds  Hematology   Anesthesia Other Findings   Reproductive/Obstetrics                              Anesthesia Physical Anesthesia Plan  ASA:   Anesthesia Plan:    Post-op Pain Management:    Induction:   PONV Risk Score and Plan:   Airway Management Planned:   Additional Equipment:   Intra-op Plan:   Post-operative Plan:   Informed Consent:   Plan Discussed with:   Anesthesia Plan Comments: (PAT note written 04/11/2024 by Emile Ringgenberg, PA-C.  )        Anesthesia Quick Evaluation

## 2024-04-11 NOTE — Progress Notes (Signed)
 Anesthesia Chart Review:  Case: [id number] Date/Time: 04/13/24 0715   Procedure: ARTHROPLASTY, HIP, TOTAL, ANTERIOR APPROACH (Right: Hip)   Anesthesia type: Spinal   Diagnosis: Primary osteoarthritis of right hip [M16.11]   Pre-op diagnosis: right hip osteoarthritis   Location: [facility] ROOM 05 / [facility]   Surgeons: Addie Cordella Hamilton, MD       DISCUSSION: Patient is a 78 year old female scheduled for the above procedure.  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   PCP Dr. Geofm classified patient is low risk for above surgery (scanned under Media tab).   Preoperative cardiology evaluation on 03/20/2024 by Emelia Hazy, NP: Chart reviewed as part of pre-operative protocol coverage. Given past medical history and time since last visit, based on ACC/AHA guidelines, Sharon Knight would be at acceptable risk for the planned procedure without further cardiovascular testing... Per office protocol, patient can hold Eliquis  for 3 days prior to procedure.  Please resume when medically safe to do so. Patient will not need bridging with Lovenox  (enoxaparin ) around procedure. Low risk stress test in 2022.  Last Eliquis  04/09/2024.   A1c 6.3%.   Anesthesia team to evaluate on the day of surgery.    VS: BP (!) 137/56   Pulse 62   Temp 36.9 C (Oral)   Resp 20   Ht 5' 3 (1.6 m)   Wt 72.8 kg   SpO2 99%   BMI 28.41 kg/m   PROVIDERS: Geofm Glade PARAS, MD is PCP  Raford Riggs, MD is cardiologist  Inocencio Chi, MD is EP Rayburn Pac, MD is nephrologist   LABS: Labs reviewed: Acceptable for surgery. UA ordered per ortho. Moderate leukocytes, but negative nitrites, WBC 0-5, no bacteria seen--reflex urine culture not performed by lab since WBC < 10 and squamous epithelial cells were only 0-5.  (all labs ordered are listed, but only abnormal  results are displayed)  Labs Reviewed  GLUCOSE, CAPILLARY - Abnormal; Notable for the following components:      Result Value   Glucose-Capillary 160 (*)    All other components within normal limits  BASIC METABOLIC PANEL WITH GFR - Abnormal; Notable for the following components:   Glucose, Bld 144 (*)    Creatinine, Ser 1.06 (*)    GFR, Estimated 53 (*)    All other components within normal limits  URINALYSIS, W/ REFLEX TO CULTURE (INFECTION SUSPECTED) - Abnormal; Notable for the following components:   Leukocytes,Ua MODERATE (*)    All other components within normal limits  HEMOGLOBIN A1C - Abnormal; Notable for the following components:   Hgb A1c MFr Bld 6.3 (*)    All other components within normal limits  SURGICAL PCR SCREEN  CBC  TYPE AND SCREEN     IMAGES: MRI Right Hip 12/27/2023: IMPRESSION: 1. Severe osteoarthritis of the right hip. Large right hip joint effusion. 2. No hip fracture, dislocation or avascular necrosis. 3. 3.3 cm right ovarian cyst. No follow up imaging recommended. Note: This recommendation does not apply to premenarchal patients and to those with increased risk (genetic, family history, elevated tumor markers or other high-risk factors) of ovarian cancer. Reference: JACR 2020 Feb; 17(2):248-254  Xray L-spine 07/21/2023: IMPRESSION: 1. No fracture or acute finding. 2. Degenerative changes as detailed, most severe at L2-L3 and L3-L4. Degenerative changes have progressed at L1-L2 and L3-L4 since the prior radiographs.     EKG: 11/19/2023: Normal  sinus rhythm No acute ST/T wave changes LOW VOLTAGE Confirmed by Vannie Mora ([id number]) on 11/19/2023 10:43:21 AM  CV: Nuclear stress test 12/31/2020:   Lexiscan  stress is electrically negative for ischemia   Myoview  scan shows normal perfusion.   Nuclear stress EF: 77 %. The left ventricular ejection fraction is hyperdynamic (>65%). Left ventricular function is normal. End diastolic cavity size is  normal.   Low risk scan.    30 Day Event Monitor 12/23/2017 - 01/21/2018: Quality: Fair.  Baseline artifact. Predominant rhythm: sinus rhythm Average heart rate: 68 bpm Max heart rate: 174 bpm Min heart rate: 38 bpm 199 pauses >3 seconds Pauses noted up to 6 seconds. All occurred over night or early morning. PVCs Atrial flutter 16% (71% controlled, <1% slow, 29% rapid)    Echo 12/11/2017: Study Conclusions  - Left ventricle: The cavity size was normal. Wall thickness was    increased in a pattern of moderate LVH. Systolic function was    vigorous. The estimated ejection fraction was in the range of 65%    to 70%. Wall motion was normal; there were no regional wall    motion abnormalities. The study was not technically sufficient to    allow evaluation of LV diastolic dysfunction due to atrial    fibrillation.  - Aortic valve: Valve area (VTI): 2.14 cm^2. Valve area (Vmax):    2.43 cm^2. Valve area (Vmean): 2.33 cm^2.  - Mitral valve: Valve area by continuity equation (using LVOT    flow): 1.61 cm^2.  - Atrial septum: No defect or patent foramen ovale was identified.   Past Medical History:  Diagnosis Date   Arthritis    Atrial fibrillation (HCC)    Bilateral cataracts    Dr Rosan   Cancer [facility])    skin cancer on face- removed   Chronic kidney disease    Diabetes mellitus without complication (HCC)    Exertional dyspnea 12/19/2020   Hyperlipidemia    Hypertension    Pneumonia     Past Surgical History:  Procedure Laterality Date   CATARACT EXTRACTION W/ INTRAOCULAR LENS IMPLANT     COLONOSCOPY     Dr Aneita   NEPHRECTOMY     right; for cyst on kidney   OVARIAN CYST REMOVAL      MEDICATIONS:  apixaban  (ELIQUIS ) 5 MG TABS tablet   acetaminophen  (TYLENOL ) 500 MG tablet   atorvastatin  (LIPITOR) 10 MG tablet   Cholecalciferol (D3) 25 MCG (1000 UT) capsule   Cyanocobalamin (B-12 PO)   diltiazem  (CARTIA  XT) 180 MG 24 hr capsule   Docusate Sodium (STOOL  SOFTENER LAXATIVE PO)   flecainide  (TAMBOCOR ) 100 MG tablet   hydrochlorothiazide  (HYDRODIURIL ) 25 MG tablet   magnesium 30 MG tablet   Misc Natural Products (TART CHERRY ADVANCED PO)   olmesartan  (BENICAR ) 5 MG tablet   No current facility-administered medications for this encounter.    Isaiah Ruder, PA-C Surgical Short Stay/Anesthesiology [facility] Phone [phone] [facility] Phone [phone] 04/11/2024 4:03 PM

## 2024-04-12 NOTE — Progress Notes (Signed)
 Pt reached out to pharmacy yesterday regarding medication instructions. I called patient back to confirm that she did not have any more questions regarding her medicine. Patient confirmed that she had stopped taking Eliquis  and vitamins as instructed, and she would take her flecainide  (tambocor ) and diltiazem  tomorrow morning as instructed. Pt reports she takes docusate and atorvastatin  at night time and will take those tonight. Patient had no further questions.

## 2024-04-13 ENCOUNTER — Observation Stay (HOSPITAL_COMMUNITY)
Admission: RE | Admit: 2024-04-13 | Discharge: 2024-04-17 | Disposition: A | Discharge status: Skilled Nursing Facility | Attending: Orthopedic Surgery | Admitting: Orthopedic Surgery

## 2024-04-13 ENCOUNTER — Encounter (HOSPITAL_COMMUNITY)
Admission: RE | Disposition: A | Discharge status: Skilled Nursing Facility | Payer: Self-pay | Source: Home / Self Care | Attending: Orthopedic Surgery

## 2024-04-13 ENCOUNTER — Ambulatory Visit (HOSPITAL_COMMUNITY): Payer: Self-pay | Admitting: Vascular Surgery

## 2024-04-13 ENCOUNTER — Ambulatory Visit (HOSPITAL_COMMUNITY)

## 2024-04-13 ENCOUNTER — Encounter (HOSPITAL_COMMUNITY): Payer: Self-pay | Admitting: Orthopedic Surgery

## 2024-04-13 ENCOUNTER — Other Ambulatory Visit: Payer: Self-pay

## 2024-04-13 ENCOUNTER — Ambulatory Visit (HOSPITAL_COMMUNITY): Admitting: Anesthesiology

## 2024-04-13 ENCOUNTER — Observation Stay (HOSPITAL_COMMUNITY)

## 2024-04-13 DIAGNOSIS — Z7901 Long term (current) use of anticoagulants: Secondary | ICD-10-CM | POA: Diagnosis not present

## 2024-04-13 DIAGNOSIS — Z85828 Personal history of other malignant neoplasm of skin: Secondary | ICD-10-CM | POA: Diagnosis not present

## 2024-04-13 DIAGNOSIS — N1832 Chronic kidney disease, stage 3b: Secondary | ICD-10-CM | POA: Diagnosis not present

## 2024-04-13 DIAGNOSIS — Z79899 Other long term (current) drug therapy: Secondary | ICD-10-CM | POA: Diagnosis not present

## 2024-04-13 DIAGNOSIS — E119 Type 2 diabetes mellitus without complications: Secondary | ICD-10-CM

## 2024-04-13 DIAGNOSIS — M1611 Unilateral primary osteoarthritis, right hip: Secondary | ICD-10-CM | POA: Diagnosis not present

## 2024-04-13 DIAGNOSIS — Z96641 Presence of right artificial hip joint: Secondary | ICD-10-CM | POA: Diagnosis not present

## 2024-04-13 DIAGNOSIS — Z87891 Personal history of nicotine dependence: Secondary | ICD-10-CM | POA: Diagnosis not present

## 2024-04-13 DIAGNOSIS — I129 Hypertensive chronic kidney disease with stage 1 through stage 4 chronic kidney disease, or unspecified chronic kidney disease: Secondary | ICD-10-CM | POA: Diagnosis not present

## 2024-04-13 DIAGNOSIS — Z01818 Encounter for other preprocedural examination: Secondary | ICD-10-CM

## 2024-04-13 DIAGNOSIS — Z471 Aftercare following joint replacement surgery: Secondary | ICD-10-CM | POA: Diagnosis not present

## 2024-04-13 DIAGNOSIS — N183 Chronic kidney disease, stage 3 unspecified: Secondary | ICD-10-CM | POA: Diagnosis not present

## 2024-04-13 DIAGNOSIS — E1122 Type 2 diabetes mellitus with diabetic chronic kidney disease: Secondary | ICD-10-CM | POA: Diagnosis not present

## 2024-04-13 HISTORY — PX: TOTAL HIP ARTHROPLASTY: SHX124

## 2024-04-13 LAB — GLUCOSE, CAPILLARY
Glucose-Capillary: 151 mg/dL — ABNORMAL HIGH (ref 70–99)
Glucose-Capillary: 133 mg/dL — ABNORMAL HIGH (ref 70–99)

## 2024-04-13 SURGERY — ARTHROPLASTY, HIP, TOTAL, ANTERIOR APPROACH
Anesthesia: Spinal | Site: Hip | Laterality: Right

## 2024-04-13 MED ORDER — ALBUMIN HUMAN 5 % IV SOLN: INTRAVENOUS | Status: DC | PRN

## 2024-04-13 MED ORDER — APIXABAN 5 MG PO TABS
5.0000 mg | ORAL_TABLET | Freq: Two times a day (BID) | ORAL | Status: DC
  Administered 2024-04-14 – 2024-04-17 (×7): 5 mg via ORAL
  Filled 2024-04-13 (×7): qty 1

## 2024-04-13 MED ORDER — FENTANYL CITRATE (PF) 250 MCG/5ML IJ SOLN
INTRAMUSCULAR | Status: DC | PRN
  Administered 2024-04-13: 50 ug via INTRAVENOUS

## 2024-04-13 MED ORDER — DOCUSATE SODIUM 100 MG PO CAPS
100.0000 mg | ORAL_CAPSULE | Freq: Two times a day (BID) | ORAL | Status: DC
  Administered 2024-04-13 – 2024-04-14 (×2): 100 mg via ORAL
  Filled 2024-04-13 (×3): qty 1

## 2024-04-13 MED ORDER — DILTIAZEM HCL ER COATED BEADS 180 MG PO CP24
180.0000 mg | ORAL_CAPSULE | Freq: Every day | ORAL | Status: DC
  Administered 2024-04-14 – 2024-04-17 (×4): 180 mg via ORAL
  Filled 2024-04-13 (×4): qty 1

## 2024-04-13 MED ORDER — BUPIVACAINE HCL (PF) 0.25 % IJ SOLN
INTRAMUSCULAR | Status: AC
  Filled 2024-04-13: qty 30

## 2024-04-13 MED ORDER — PHENYLEPHRINE HCL-NACL 20-0.9 MG/250ML-% IV SOLN
INTRAVENOUS | Status: DC | PRN
  Administered 2024-04-13: 35 ug/min via INTRAVENOUS

## 2024-04-13 MED ORDER — CLONIDINE HCL (ANALGESIA) 100 MCG/ML EP SOLN
EPIDURAL | Status: AC
  Filled 2024-04-13: qty 10

## 2024-04-13 MED ORDER — STERILE WATER FOR IRRIGATION IR SOLN
Status: DC | PRN
  Administered 2024-04-13: 2000 mL

## 2024-04-13 MED ORDER — PROPOFOL 10 MG/ML IV BOLUS
INTRAVENOUS | Status: AC
  Filled 2024-04-13: qty 20

## 2024-04-13 MED ORDER — HYDROMORPHONE HCL 1 MG/ML IJ SOLN: 0.2500 mg | INTRAMUSCULAR | Status: DC | PRN

## 2024-04-13 MED ORDER — FLECAINIDE ACETATE 100 MG PO TABS
100.0000 mg | ORAL_TABLET | Freq: Two times a day (BID) | ORAL | Status: DC
  Administered 2024-04-13 – 2024-04-17 (×8): 100 mg via ORAL
  Filled 2024-04-13 (×9): qty 1

## 2024-04-13 MED ORDER — MIDAZOLAM HCL (PF) 2 MG/2ML IJ SOLN: 0.5000 mg | Freq: Once | INTRAMUSCULAR | Status: DC | PRN

## 2024-04-13 MED ORDER — MENTHOL 3 MG MT LOZG: 1.0000 | LOZENGE | OROMUCOSAL | Status: DC | PRN

## 2024-04-13 MED ORDER — EPHEDRINE SULFATE (PRESSORS) 25 MG/5ML IV SOSY
PREFILLED_SYRINGE | INTRAVENOUS | Status: DC | PRN
  Administered 2024-04-13 (×2): 5 mg via INTRAVENOUS

## 2024-04-13 MED ORDER — ORAL CARE MOUTH RINSE: 15.0000 mL | Freq: Once | OROMUCOSAL | Status: AC

## 2024-04-13 MED ORDER — ACETAMINOPHEN 500 MG PO TABS
1000.0000 mg | ORAL_TABLET | Freq: Once | ORAL | Status: AC
  Administered 2024-04-13: 1000 mg via ORAL
  Filled 2024-04-13: qty 2

## 2024-04-13 MED ORDER — VANCOMYCIN HCL 1000 MG IV SOLR
INTRAVENOUS | Status: AC
  Filled 2024-04-13: qty 20

## 2024-04-13 MED ORDER — TRANEXAMIC ACID-NACL 1000-0.7 MG/100ML-% IV SOLN
1000.0000 mg | INTRAVENOUS | Status: AC
  Administered 2024-04-13: 1000 mg via INTRAVENOUS
  Filled 2024-04-13: qty 100

## 2024-04-13 MED ORDER — MORPHINE SULFATE (PF) 4 MG/ML IV SOLN
INTRAVENOUS | Status: AC
  Filled 2024-04-13: qty 2

## 2024-04-13 MED ORDER — MORPHINE SULFATE 4 MG/ML IJ SOLN
INTRAMUSCULAR | Status: DC | PRN
  Administered 2024-04-13: 10 mL

## 2024-04-13 MED ORDER — SODIUM CHLORIDE (PF) 0.9 % IJ SOLN
INTRAMUSCULAR | Status: AC
  Filled 2024-04-13: qty 20

## 2024-04-13 MED ORDER — SODIUM CHLORIDE 0.9 % IR SOLN
Status: DC | PRN
  Administered 2024-04-13: 3000 mL

## 2024-04-13 MED ORDER — MIDAZOLAM HCL 2 MG/2ML IJ SOLN
INTRAMUSCULAR | Status: AC
  Filled 2024-04-13: qty 2

## 2024-04-13 MED ORDER — SODIUM CHLORIDE 0.9 % IV SOLN: Freq: Once | INTRAVENOUS | Status: DC

## 2024-04-13 MED ORDER — CHLORHEXIDINE GLUCONATE 0.12 % MT SOLN
15.0000 mL | Freq: Once | OROMUCOSAL | Status: AC
  Administered 2024-04-13: 15 mL via OROMUCOSAL
  Filled 2024-04-13: qty 15

## 2024-04-13 MED ORDER — 0.9 % SODIUM CHLORIDE (POUR BTL) OPTIME
TOPICAL | Status: DC | PRN
  Administered 2024-04-13: 1000 mL

## 2024-04-13 MED ORDER — MIDAZOLAM HCL (PF) 2 MG/2ML IJ SOLN
INTRAMUSCULAR | Status: DC | PRN
  Administered 2024-04-13 (×2): 1 mg via INTRAVENOUS

## 2024-04-13 MED ORDER — POVIDONE-IODINE 10 % EX SWAB
2.0000 | Freq: Once | CUTANEOUS | Status: AC
  Administered 2024-04-13: 2 via TOPICAL

## 2024-04-13 MED ORDER — LACTATED RINGERS IV SOLN: INTRAVENOUS | Status: DC

## 2024-04-13 MED ORDER — PHENOL 1.4 % MT LIQD: 1.0000 | OROMUCOSAL | Status: DC | PRN

## 2024-04-13 MED ORDER — PROPOFOL 500 MG/50ML IV EMUL
INTRAVENOUS | Status: DC | PRN
  Administered 2024-04-13: 75 ug/kg/min via INTRAVENOUS

## 2024-04-13 MED ORDER — OXYCODONE HCL 5 MG PO TABS: 5.0000 mg | ORAL_TABLET | Freq: Once | ORAL | Status: DC | PRN

## 2024-04-13 MED ORDER — BUPIVACAINE IN DEXTROSE 0.75-8.25 % IT SOLN
INTRATHECAL | Status: DC | PRN
  Administered 2024-04-13: 12 mg via INTRATHECAL

## 2024-04-13 MED ORDER — HYDROCHLOROTHIAZIDE 25 MG PO TABS
25.0000 mg | ORAL_TABLET | Freq: Every day | ORAL | Status: DC
  Administered 2024-04-14 – 2024-04-17 (×4): 25 mg via ORAL
  Filled 2024-04-13 (×4): qty 1

## 2024-04-13 MED ORDER — FENTANYL CITRATE (PF) 100 MCG/2ML IJ SOLN
INTRAMUSCULAR | Status: AC
  Filled 2024-04-13: qty 2

## 2024-04-13 MED ORDER — CEFAZOLIN SODIUM-DEXTROSE 2-4 GM/100ML-% IV SOLN
2.0000 g | INTRAVENOUS | Status: AC
  Administered 2024-04-13: 2 g via INTRAVENOUS
  Filled 2024-04-13: qty 100

## 2024-04-13 MED ORDER — IRRISEPT - 450ML BOTTLE WITH 0.05% CHG IN STERILE WATER, USP 99.95% OPTIME
TOPICAL | Status: DC | PRN
  Administered 2024-04-13: 450 mL via TOPICAL

## 2024-04-13 MED ORDER — ACETAMINOPHEN 325 MG PO TABS
325.0000 mg | ORAL_TABLET | Freq: Four times a day (QID) | ORAL | Status: DC | PRN
  Administered 2024-04-14: 500 mg via ORAL
  Administered 2024-04-14 – 2024-04-16 (×2): 650 mg via ORAL
  Filled 2024-04-13 (×2): qty 2

## 2024-04-13 MED ORDER — BUPIVACAINE LIPOSOME 1.3 % IJ SUSP
INTRAMUSCULAR | Status: AC
  Filled 2024-04-13: qty 20

## 2024-04-13 MED ORDER — IRBESARTAN 75 MG PO TABS
37.5000 mg | ORAL_TABLET | Freq: Every day | ORAL | Status: DC
  Administered 2024-04-14 – 2024-04-17 (×4): 37.5 mg via ORAL
  Filled 2024-04-13 (×4): qty 0.5

## 2024-04-13 MED ORDER — OXYCODONE HCL 5 MG/5ML PO SOLN: 5.0000 mg | Freq: Once | ORAL | Status: DC | PRN

## 2024-04-13 MED ORDER — SODIUM CHLORIDE (PF) 0.9 % IJ SOLN
INTRAMUSCULAR | Status: DC | PRN
  Administered 2024-04-13: 60 mL

## 2024-04-13 MED ORDER — VANCOMYCIN HCL 1000 MG IV SOLR
INTRAVENOUS | Status: DC | PRN
  Administered 2024-04-13: 1000 mg via TOPICAL

## 2024-04-13 SURGICAL SUPPLY — 49 items
BAG COUNTER SPONGE SURGICOUNT (BAG) ×1 IMPLANT
BAG DECANTER FOR FLEXI CONT (MISCELLANEOUS) ×1 IMPLANT
BLADE CLIPPER SURG (BLADE) ×1 IMPLANT
BLADE SAG 18X100X1.27 (BLADE) ×1 IMPLANT
CNTNR URN SCR LID CUP LEK RST (MISCELLANEOUS) IMPLANT
COOLER ICEMAN CLASSIC (MISCELLANEOUS) IMPLANT
COVER PERINEAL POST (MISCELLANEOUS) ×1 IMPLANT
COVER SURGICAL LIGHT HANDLE (MISCELLANEOUS) ×1 IMPLANT
CUP ACETAB EMPH CMTLS 50 3H (Cup) IMPLANT
DRAPE C-ARM 42X72 X-RAY (DRAPES) ×1 IMPLANT
DRAPE IMP U-DRAPE 54X76 (DRAPES) IMPLANT
DRAPE STERI IOBAN 125X83 (DRAPES) ×1 IMPLANT
DRAPE U-SHAPE 47X51 STRL (DRAPES) ×3 IMPLANT
DRSG AQUACEL AG ADV 3.5X10 (GAUZE/BANDAGES/DRESSINGS) ×1 IMPLANT
DURAPREP 26ML APPLICATOR (WOUND CARE) ×1 IMPLANT
ELECTRODE BLDE 4.0 EZ CLN MEGD (MISCELLANEOUS) ×1 IMPLANT
ELECTRODE REM PT RTRN 9FT ADLT (ELECTROSURGICAL) ×1 IMPLANT
GLOVE BIOGEL PI IND STRL 8 (GLOVE) ×1 IMPLANT
GLOVE BIOGEL PI MICRO 8.0 (GLOVE) ×2 IMPLANT
GOWN STRL REUS W/ TWL LRG LVL3 (GOWN DISPOSABLE) ×3 IMPLANT
HEAD M SROM 36MM 2 (Hips) IMPLANT
HOOD PEEL AWAY T7 (MISCELLANEOUS) ×3 IMPLANT
KIT BASIN OR (CUSTOM PROCEDURE TRAY) ×1 IMPLANT
KIT TURNOVER KIT B (KITS) ×1 IMPLANT
LAVAGE JET IRRISEPT WOUND (IRRIGATION / IRRIGATOR) ×1 IMPLANT
LINER ACET EMPH NTRL 36X50 +4 (Liner) IMPLANT
NDL HYPO 22X1.5 SAFETY MO (MISCELLANEOUS) ×1 IMPLANT
NEEDLE HYPO 22X1.5 SAFETY MO (MISCELLANEOUS) ×1 IMPLANT
PACK TOTAL JOINT (CUSTOM PROCEDURE TRAY) ×1 IMPLANT
PAD ARMBOARD POSITIONER FOAM (MISCELLANEOUS) ×1 IMPLANT
PAD COLD SHLDR WRAP-ON (PAD) IMPLANT
PENCIL BUTTON HOLSTER BLD 10FT (ELECTRODE) IMPLANT
SCREW 6.5MMX25MM (Screw) IMPLANT
SET HNDPC FAN SPRY TIP SCT (DISPOSABLE) ×1 IMPLANT
SOLN 0.9% NACL POUR BTL 1000ML (IV SOLUTION) ×1 IMPLANT
SOLN STERILE WATER BTL 1000 ML (IV SOLUTION) ×1 IMPLANT
STEM FEM ACTIS STD SZ7 (Nail) IMPLANT
STRIP CLOSURE SKIN 1/2X4 (GAUZE/BANDAGES/DRESSINGS) ×1 IMPLANT
SUT ETHIBOND NAB CT1 #1 30IN (SUTURE) ×2 IMPLANT
SUT MNCRL AB 3-0 PS2 18 (SUTURE) ×1 IMPLANT
SUT VIC AB 0 CT1 27XBRD ANBCTR (SUTURE) ×3 IMPLANT
SUT VIC AB 1 CT1 27XBRD ANBCTR (SUTURE) ×3 IMPLANT
SUT VIC AB 2-0 CT2 27 (SUTURE) ×2 IMPLANT
SYR 20ML LL LF (SYRINGE) IMPLANT
SYR 30ML LL (SYRINGE) ×1 IMPLANT
TOWEL GREEN STERILE (TOWEL DISPOSABLE) ×1 IMPLANT
TOWEL GREEN STERILE FF (TOWEL DISPOSABLE) ×1 IMPLANT
TRAY FOLEY MTR SLVR 16FR STAT (SET/KITS/TRAYS/PACK) ×1 IMPLANT
TUBE SUCT ARGYLE STRL (TUBING) IMPLANT

## 2024-04-13 NOTE — H&P (Signed)
 TOTAL HIP ADMISSION H&P  Patient is admitted for right total hip arthroplasty.  Subjective:  Chief Complaint: right hip pain  HPI: Sharon Knight, 78 y.o. female, has a history of pain and functional disability in the right hip(s) due to arthritis and patient has failed non-surgical conservative treatments for greater than 12 weeks to include NSAID's and/or analgesics, flexibility and strengthening excercises, and activity modification.  Onset of symptoms was gradual starting >10 years ago with gradually worsening course since that time.The patient noted no past surgery on the right hip(s).  Patient currently rates pain in the right hip at 9 out of 10 with activity. Patient has night pain, worsening of pain with activity and weight bearing, trendelenberg gait, and pain that interfers with activities of daily living. Patient has evidence of subchondral sclerosis and joint space narrowing by imaging studies. This condition presents safety issues increasing the risk of falls. This patient has had a long history of non op treatment.  There is no current active infection.No personal or family h/o dvt or pe.  Patient Active Problem List   Diagnosis Date Noted   Arthritis of right hip 08/04/2023   Greater trochanteric bursitis, right 08/04/2023   RLS (restless legs syndrome) 06/09/2023   Sleep difficulties 06/09/2023   Low energy 06/09/2023   Type 2 diabetes mellitus with stage 3b chronic kidney disease, without long-term current use of insulin (HCC) 12/01/2022   Vertigo 10/23/2021   Exertional dyspnea 12/19/2020   Aortic atherosclerosis 10/16/2020   Acute pain of left knee 09/16/2020   Trigger finger, right ring finger 03/17/2019   CKD stage 3, 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 03/16/2019   Bilateral low back pain 03/17/2018   History of gout 12/21/2017   Lobar pneumonia 12/10/2017   Paroxysmal atrial fibrillation (HCC) 12/10/2017   History of colonic polyps 10/22/2014   History of  nephrectomy, unilateral 11/13/2012   Vitamin D  deficiency 10/31/2012   ECZEMA 03/03/2010   Benign neoplasm of colon 08/02/2007   Hyperlipidemia 08/02/2007   Essential hypertension 08/02/2007   Osteopenia 08/02/2007   Past Medical History:  Diagnosis Date   Arthritis    Atrial fibrillation (HCC)    Bilateral cataracts    Dr Rosan   Cancer [facility])    skin cancer on face- removed   Chronic kidney disease    Diabetes mellitus without complication (HCC)    Exertional dyspnea 12/19/2020   Hyperlipidemia    Hypertension    Pneumonia     Past Surgical History:  Procedure Laterality Date   CATARACT EXTRACTION W/ INTRAOCULAR LENS IMPLANT     COLONOSCOPY     Dr Aneita   NEPHRECTOMY     right; for cyst on kidney   OVARIAN CYST REMOVAL      Current Facility-Administered Medications  Medication Dose Route Frequency Provider Last Rate Last Admin   acetaminophen  (TYLENOL ) tablet 1,000 mg  1,000 mg Oral Once Magnant, Charles L, PA-C       ceFAZolin (ANCEF) IVPB 2g/100 mL premix  2 g Intravenous On Call to OR Magnant, Carlin CROME, PA-C       chlorhexidine (PERIDEX) 0.12 % solution 15 mL  15 mL Mouth/Throat Once Stoltzfus, Cordella SQUIBB, DO       Or   Oral care mouth rinse  15 mL Mouth Rinse Once Stoltzfus, Estela Vinal P, DO       lactated ringers infusion   Intravenous Continuous Stoltzfus, Tiffinie Caillier P, DO       povidone-iodine 10 %  swab 2 Application  2 Application Topical Once Magnant, Charles L, PA-C       tranexamic acid (CYKLOKAPRON) IVPB 1,000 mg  1,000 mg Intravenous To OR Magnant, Charles L, PA-C       Allergies[1]  Social History   Tobacco Use   Smoking status: Former    Current packs/day: 0.00    Types: Cigarettes    Quit date: 08/27/1984    Years since quitting: 39.6   Smokeless tobacco: Never   Tobacco comments:    smoked age [age], up to 1.5-2 ppd  Substance Use Topics   Alcohol use: Not Currently    Family History  Adopted: Yes  Problem Relation Age of Onset   Colon  cancer Neg Hx    Stomach cancer Neg Hx    Breast cancer Neg Hx      Review of Systems  Musculoskeletal:  Positive for arthralgias.    Objective:  Physical Exam Vitals reviewed.  HENT:     Head: Normocephalic.     Nose: Nose normal.     Mouth/Throat:     Mouth: Mucous membranes are moist.  Eyes:     Pupils: Pupils are equal, round, and reactive to light.  Cardiovascular:     Rate and Rhythm: Normal rate.     Pulses: Normal pulses.  Pulmonary:     Effort: Pulmonary effort is normal.  Abdominal:     General: Abdomen is flat.  Musculoskeletal:     Cervical back: Normal range of motion.  Skin:    General: Skin is warm.     Capillary Refill: Capillary refill takes less than 2 seconds.  Neurological:     General: No focal deficit present.     Mental Status: She is alert.  Psychiatric:        Mood and Affect: Mood normal.   Ortho exam demonstrates slight antalgic gait to the right which is a Trendelenburg gait.  Does have diminished and painful range of motion right hip with passive range of motion.  Pedal pulses palpable.  Ankle dorsiflexion intact.  Leg lengths approximately equal.    Vital signs in last 24 hours: Temp:  [98.1 F (36.7 C)] 98.1 F (36.7 C) (12/11 0605) Pulse Rate:  [66] 66 (12/11 0605) Resp:  [17] 17 (12/11 0605) BP: (148)/(57) 148/57 (12/11 0605) SpO2:  [98 %] 98 % (12/11 0605) Weight:  [72.6 kg] 72.6 kg (12/11 0605)  Labs:   Estimated body mass index is 28.34 kg/m as calculated from the following:   Height as of this encounter: 5' 3 (1.6 m).   Weight as of this encounter: 72.6 kg.   Imaging Review Plain radiographs demonstrate severe degenerative joint disease of the right hip(s). The bone quality appears to be fair for age and reported activity level.      Assessment/Plan:  End stage arthritis, right hip(s)  The patient history, physical examination, clinical judgement of the provider and imaging studies are consistent with end  stage degenerative joint disease of the right hip(s) and total hip arthroplasty is deemed medically necessary. The treatment options including medical management, injection therapy, arthroscopy and arthroplasty were discussed at length. The risks and benefits of total hip arthroplasty were presented and reviewed. The risks due to aseptic loosening, infection, stiffness, dislocation/subluxation,  thromboembolic complications and other imponderables were discussed.  The patient acknowledged the explanation, agreed to proceed with the plan and consent was signed. Patient is being admitted for inpatient treatment for surgery, pain control, PT, OT, prophylactic  antibiotics, VTE prophylaxis, progressive ambulation and ADL's and discharge planning.The patient is planning to be discharged home with home health services  Impression is end-stage severe right hip arthritis and the patient who has some medical comorbidities as well as advanced age. The risk and benefits of surgical intervention are discussed with Elveria with the use of models. Patient understands the risk and benefits and wishes to proceed. She would need to wait till sometime in December to have her hip done. We will obtain cardiac risk stratification prior to surgery and we will also plan to sort out Eliquis  management prior to surgery. All questions answered.   Patient's anticipated LOS is less than 2 midnights, meeting these requirements: - Younger than 69 - Lives within 1 hour of care - Has a competent adult at home to recover with post-op recover - NO history of  - Chronic pain requiring opiods  - Diabetes  - Coronary Artery Disease  - Heart failure  - Heart attack  - Stroke  - DVT/VTE  - Cardiac arrhythmia  - Respiratory Failure/COPD  - Renal failure  - Anemia  - Advanced Liver disease      [1]  Allergies Allergen Reactions   Ibuprofen Other (See Comments)    Dizziness made me woozy

## 2024-04-13 NOTE — Anesthesia Procedure Notes (Signed)
 Spinal  Patient location during procedure: OR End time: 04/13/2024 8:05 AM Reason for block: surgical anesthesia  Staffing Performed: anesthesiologist  Authorized by: Leonce Athens, MD   Performed by: Leonce Athens, MD  Preanesthetic Checklist Completed: patient identified, IV checked, site marked, risks and benefits discussed, surgical consent, monitors and equipment checked, pre-op evaluation and timeout performed Spinal Block Patient position: sitting Prep: DuraPrep Patient monitoring: blood pressure, continuous pulse ox, cardiac monitor and heart rate Approach: midline Location: L3-4 Injection technique: single-shot Needle Needle type: Pencan and Introducer  Needle gauge: 24 G Needle length: 9 cm Assessment Events: CSF return  Additional Notes Pt identified in Operating room.  Monitors applied. Working IV access confirmed. Sterile prep, drape lumbar spine.  1% lido local L 3,4.  #24ga Pencan, multiple attempts os, then into clear CSF L 3,4.  12mg  0.75% Bupivacaine with dextrose  injected with asp CSF beginning and end of injection.  Patient asymptomatic, VSS, no heme aspirated, tolerated well.  JAYSON Leonce, MD

## 2024-04-13 NOTE — Evaluation (Signed)
 Physical Therapy Evaluation Patient Details Name: Sharon Knight MRN: [id number] DOB: [birth year] Today's Date: 04/13/2024  History of Present Illness  Pt is 78 yo presenting to [facility] for scheduled R THA. PMH: sepsis, a-fib with RVR, HTN.  Clinical Impression  Pt currently presenting at Min A for bed mobility, sit to stand and gait with RW. Pt able to ambulate short non-functional distance with RW with unsteady, step to to partial step through gait pattern. Pt cares for spouse at home and has little assistance with no physical assistance available. Due to pt current functional status, home set up and available assistance at home recommending skilled physical therapy services < 3 hours/day in order to address strength, balance and functional mobility to decrease risk for falls, injury, immobility, skin break down and re-hospitalization.          If plan is discharge home, recommend the following: Assistance with cooking/housework;Assist for transportation;Help with stairs or ramp for entrance   Can travel by private vehicle   Yes    Equipment Recommendations BSC/3in1     Functional Status Assessment Patient has had a recent decline in their functional status and demonstrates the ability to make significant improvements in function in a reasonable and predictable amount of time.     Precautions / Restrictions Precautions Precautions: Fall Recall of Precautions/Restrictions: Intact Restrictions Weight Bearing Restrictions Per Provider Order: No      Mobility  Bed Mobility Overal bed mobility: Needs Assistance Bed Mobility: Supine to Sit     Supine to sit: Min assist     General bed mobility comments: Min A for trunk to midline    Transfers Overall transfer level: Needs assistance Equipment used: Rolling walker (2 wheels) Transfers: Sit to/from Stand Sit to Stand: Min assist           General transfer comment: Min A for momentum and stabilization from EOB to standing  with RW.    Ambulation/Gait Ambulation/Gait assistance: Min assist, Contact guard assist Gait Distance (Feet): 40 Feet Assistive device: Rolling walker (2 wheels) Gait Pattern/deviations: Step-to pattern, Step-through pattern, Narrow base of support, Decreased stance time - right, Decreased step length - left, Decreased step length - right Gait velocity: decreased Gait velocity interpretation: <1.31 ft/sec, indicative of household ambulator   General Gait Details: unsteady, uneven steps with CGA to Min A for gait with RW, Mod UE support on RW.    Balance Overall balance assessment: Needs assistance Sitting-balance support: Bilateral upper extremity supported, Feet supported Sitting balance-Leahy Scale: Fair     Standing balance support: Bilateral upper extremity supported, During functional activity, Reliant on assistive device for balance Standing balance-Leahy Scale: Poor Standing balance comment: CGA to Min A external support.         Pertinent Vitals/Pain Pain Assessment Pain Assessment: 0-10 Pain Score: 6  Pain Location: R hip pain Pain Descriptors / Indicators: Aching Pain Intervention(s): Limited activity within patient's tolerance, Monitored during session    Home Living Family/patient expects to be discharged to:: Private residence Living Arrangements: Spouse/significant other   Type of Home: House Home Access: Stairs to enter Entrance Stairs-Rails: Can reach both;Left;Right Entrance Stairs-Number of Steps: 6   Home Layout: One level Home Equipment: Shower seat;Grab bars - tub/shower;Rolling Environmental Consultant (2 wheels);Cane - single point      Prior Function Prior Level of Function : Independent/Modified Independent;Driving             Mobility Comments: Pt reports she uses a RW at home. ADLs Comments: usually gets  groceries delivered or a friend helps to get them.     Extremity/Trunk Assessment   Upper Extremity Assessment Upper Extremity Assessment:  Overall WFL for tasks assessed    Lower Extremity Assessment Lower Extremity Assessment: RLE deficits/detail RLE Deficits / Details: recent THA    Cervical / Trunk Assessment Cervical / Trunk Assessment: Normal  Communication   Communication Communication: No apparent difficulties    Cognition Arousal: Alert Behavior During Therapy: WFL for tasks assessed/performed   PT - Cognitive impairments: No apparent impairments     Following commands: Intact       Cueing Cueing Techniques: Verbal cues     General Comments General comments (skin integrity, edema, etc.): Pt reports no dizziness initially; after ambulating at the doorway pt reports some dizziness. Encouraged to turn around. Pt ambulated a little further and then turned.        Assessment/Plan    PT Assessment Patient needs continued PT services  PT Problem List Decreased strength;Decreased balance;Decreased mobility;Decreased activity tolerance;Pain       PT Treatment Interventions DME instruction;Therapeutic activities;Gait training;Therapeutic exercise;Stair training;Functional mobility training;Balance training;Patient/family education    PT Goals (Current goals can be found in the Care Plan section)  Acute Rehab PT Goals Patient Stated Goal: to improve mobility prior to returning home. PT Goal Formulation: With patient Time For Goal Achievement: 04/27/24 Potential to Achieve Goals: Good    Frequency Min 5X/week        AM-PAC PT 6 Clicks Mobility  Outcome Measure Help needed turning from your back to your side while in a flat bed without using bedrails?: A Little Help needed moving from lying on your back to sitting on the side of a flat bed without using bedrails?: A Little Help needed moving to and from a bed to a chair (including a wheelchair)?: A Little Help needed standing up from a chair using your arms (e.g., wheelchair or bedside chair)?: A Little Help needed to walk in hospital room?: A  Lot Help needed climbing 3-5 steps with a railing? : Total 6 Click Score: 15    End of Session Equipment Utilized During Treatment: Gait belt Activity Tolerance: Patient tolerated treatment well Patient left: in chair;with call bell/phone within reach;with chair alarm set Nurse Communication: Mobility status PT Visit Diagnosis: Unsteadiness on feet (R26.81);Other abnormalities of gait and mobility (R26.89);Muscle weakness (generalized) (M62.81);Pain Pain - Right/Left: Right Pain - part of body: Hip    Time: 8364-8341 PT Time Calculation (min) (ACUTE ONLY): 23 min   Charges:   PT Evaluation $PT Eval Low Complexity: 1 Low PT Treatments $Therapeutic Activity: 8-22 mins PT General Charges $$ ACUTE PT VISIT: 1 Visit         Sharon Knight, DPT, CLT  Acute Rehabilitation Services Office: [phone] (Secure chat preferred)   Sharon Knight 04/13/2024, 5:06 PM

## 2024-04-13 NOTE — Op Note (Unsigned)
 NAME: Sharon Knight, Sharon Knight MEDICAL RECORD NO: [id number] ACCOUNT NO: [id number] DATE OF BIRTH: July 31, 1945 FACILITY: [facility] LOCATION: [facility] PHYSICIAN: Cordella RAMAN. Addie, MD  Operative Report   DATE OF PROCEDURE: 04/13/2024  PREOPERATIVE DIAGNOSIS:  Right hip arthritis.  POSTOPERATIVE DIAGNOSIS:  Right hip arthritis.  PROCEDURE:  Right total hip replacement using DePuy/[employer] total hip components including size 50 Emphasis cup with 1 screw, +4 liner, size 7 Actis stem with -2 stainless steel head.  SURGEON:  Cordella RAMAN. Addie, MD  ASSISTANT:  Herlene Calix, PA  INDICATIONS:  This is a 78 year old patient with end-stage right hip arthritis who presents for operative management after explanation of the risks and benefits.  DESCRIPTION OF PROCEDURE:  The patient was brought to the operating room where spinal anesthetic was induced, preoperative antibiotics were administered, and a timeout was called.  The patient was placed on the Hana bed with all prominences well padded.   The right hip was prescrubbed with alcohol and Betadine, allowed to air dry, prepped with DuraPrep solution, and draped in a sterile manner.  Ioban was used to cover the operative field.  After calling a timeout, an incision was made about 3 cm  posterior and 1 cm distal to the anterior superior iliac crest on the right-hand side.  Skin and subcutaneous tissue were sharply divided.  Branches of the lateral femoral cutaneous nerve were preserved where possible.  Fascia overlying the tensor  fasciae lata was identified.  That fascia was then split in its midportion up to the anterior superior iliac crest.  An Allis retractor was placed on the superior flap of the fascia overlying the tensor fasciae lata muscle.  A plane was then developed  between the rectus and the tensor fasciae lata.  Crossing circumflex vessels were coagulated.  Next, we placed a cobra retractor on the superior and inferior aspect of the humeral neck.  A  T-shaped capsulotomy was performed and tagged with #1 Ethibond  suture.  We took that dissection of the capsule around to the lesser tuberosity.  At this time, under fluoroscopic localization, first using an osteotome, the femoral neck cut was made.  Fluoroscopy confirmed good location of the cut.  We next then  removed the head with hip under some traction.  The head sized to a size 45.  Then, at this point, a cerebellar retractor was placed.  The capsule was tagged on both sides.  Released around the acetabulum.  The labrum was then removed from the  acetabulum, and the transverse acetabular ligament was cut.  Then, under fluoroscopic guidance, the socket was reamed in about 45 degrees of abduction and 15 degrees of anteversion.  Reamed up to a size 49 and placed a size 50 Emphasis cup.  A very good  press-fit was obtained.  Placed one screw and then the +4 mm liner.  At this time, attention was directed towards the femur.  The leg was externally rotated, and the capsule was dissected away from the conjoined tendon.  The conjoined tendon was  released.  A femoral lift was placed.  External rotation was then performed along with adduction and extension.  This gave a very good exposure of the femur.  Broaching then performed up to a size 7, and planing was performed on the calcar.  Size 7 was a  very nice fit.  We then did reduction with +1.5 and -2.  Both gave very good stability, but the -2 head gave equal leg lengths.  This construct  was stable with shuck testing with less than a centimeter of laxity and then was also stable with extension  to 70 degrees and external rotation to 90 degrees.  Also stable posteriorly.  Trial components were removed, and the true components were placed with a very good press-fit on that size 7 stem.  The -2 ball gave equal leg lengths and very good stability.   This was placed onto the dried Morse taper, and reduction was performed.  Taken through a range of motion and found  to have very good stability.  Thorough irrigation was performed at this time.  The capsule and muscle were anesthetized using Marcaine,  Exparel, and saline.  We then injected into the skin with Marcaine, morphine, and clonidine.  We also put some vancomycin powder into the femoral canal.  Irrigated out the canal first IrriSept solution.  It should also be noted that we used IrriSept  solution after the incision, after the arthrotomy, and at multiple times during the case.  With the hip reduced, we irrigated again with the IrriSept solution and then placed vancomycin powder on the implant.  Closed the capsule using #1 Vicryl suture.   Closed some of the dead space above the capsule using #1 Vicryl suture.  Then closed the iliotibial band fascia using #1 Vicryl suture, followed by an interrupted inverted 0 Vicryl suture, 2-0 Vicryl suture, and 3-0 Monocryl with Steri-Strips.  Aquacel  dressing was applied.  Leg lengths approximately equal at the end of the case.  Luke's assistance was required for retraction, opening, closing, and mobilization of tissue.  His assistance was a medical necessity.   VAI D: 04/13/2024 10:52:42 am T: 04/13/2024 9:27:00 pm  JOB: [id number]/ [id number]

## 2024-04-13 NOTE — Transfer of Care (Signed)
 Immediate Anesthesia Transfer of Care Note  Patient: Sharon Knight  Procedure(s) Performed: RIGHT TOTAL HIP ARTHROPLASTY, ANTERIOR APPROACH (Right: Hip)  Patient Location: PACU  Anesthesia Type:MAC and Spinal  Level of Consciousness: drowsy and patient cooperative  Airway & Oxygen Therapy: Patient Spontanous Breathing and Patient connected to face mask oxygen  Post-op Assessment: Report given to RN, Post -op Vital signs reviewed and stable, Patient moving all extremities, and Patient moving all extremities X 4  Post vital signs: Reviewed and stable  Last Vitals:  Vitals Value Taken Time  BP 95/37 04/13/24 10:30  Temp    Pulse 48 04/13/24 10:32  Resp 16 04/13/24 10:32  SpO2 99 % 04/13/24 10:32  Vitals shown include unfiled device data.  Last Pain:  Vitals:   04/13/24 0714  TempSrc:   PainSc: 4       Patients Stated Pain Goal: 1 (04/13/24 0711)  Complications: No notable events documented.

## 2024-04-13 NOTE — Anesthesia Procedure Notes (Signed)
 Procedure Name: MAC Date/Time: 04/13/2024 8:37 AM  Performed by: Arvell Edsel HERO, CRNAPre-anesthesia Checklist: Patient identified, Emergency Drugs available, Suction available, Patient being monitored and Timeout performed Patient Re-evaluated:Patient Re-evaluated prior to induction Oxygen Delivery Method: Simple face mask

## 2024-04-13 NOTE — Anesthesia Postprocedure Evaluation (Signed)
 Anesthesia Post Note  Patient: Sharon Knight  Procedure(s) Performed: RIGHT TOTAL HIP ARTHROPLASTY, ANTERIOR APPROACH (Right: Hip)     Patient location during evaluation: PACU Anesthesia Type: Spinal Level of consciousness: awake and alert, oriented and patient cooperative Pain management: pain level controlled Vital Signs Assessment: post-procedure vital signs reviewed and stable Respiratory status: spontaneous breathing, nonlabored ventilation and respiratory function stable Cardiovascular status: stable and blood pressure returned to baseline Postop Assessment: spinal receding, patient able to bend at knees, no apparent nausea or vomiting and adequate PO intake Anesthetic complications: no   No notable events documented.  Last Vitals:  Vitals:   04/13/24 1200 04/13/24 1215  BP: (!) 107/55 (!) 106/55  Pulse: (!) 47 (!) 48  Resp: 12 13  Temp:    SpO2: 97% 98%    Last Pain:  Vitals:   04/13/24 1130  TempSrc:   PainSc: 0-No pain                 Termaine Roupp,E. Travanti Mcmanus

## 2024-04-13 NOTE — Brief Op Note (Signed)
° ° °  04/13/2024  10:52 AM  PATIENT:  Sharon Knight  78 y.o. female  PRE-OPERATIVE DIAGNOSIS:  right hip osteoarthritis  POST-OPERATIVE DIAGNOSIS:  right hip osteoarthritis  PROCEDURE:  Procedures: RIGHT TOTAL HIP ARTHROPLASTY, ANTERIOR APPROACH  SURGEON:  Surgeon(s): Addie Cordella Hamilton, MD  ASSISTANT: magnant pa  ANESTHESIA:   spinal  EBL: 200 ml    Total I/O In: 1250 [I.V.:800; IV Piggyback:450] Out: 800 [Urine:600; Blood:200]  BLOOD ADMINISTERED: none  DRAINS: none   LOCAL MEDICATIONS USED:  marcaine morphine clonidine exparel vanco powder   SPECIMEN:  No Specimen  COUNTS:  YES  TOURNIQUET:  * No tourniquets in log *  DICTATION: .Other Dictation: Dictation Number [id number]  PLAN OF CARE: Admit for overnight observation  PATIENT DISPOSITION:  PACU - hemodynamically stable

## 2024-04-14 ENCOUNTER — Encounter (HOSPITAL_COMMUNITY): Payer: Self-pay | Admitting: Orthopedic Surgery

## 2024-04-14 DIAGNOSIS — Z7901 Long term (current) use of anticoagulants: Secondary | ICD-10-CM | POA: Diagnosis not present

## 2024-04-14 DIAGNOSIS — Z85828 Personal history of other malignant neoplasm of skin: Secondary | ICD-10-CM | POA: Diagnosis not present

## 2024-04-14 DIAGNOSIS — E1122 Type 2 diabetes mellitus with diabetic chronic kidney disease: Secondary | ICD-10-CM | POA: Diagnosis not present

## 2024-04-14 DIAGNOSIS — I129 Hypertensive chronic kidney disease with stage 1 through stage 4 chronic kidney disease, or unspecified chronic kidney disease: Secondary | ICD-10-CM | POA: Diagnosis not present

## 2024-04-14 DIAGNOSIS — Z87891 Personal history of nicotine dependence: Secondary | ICD-10-CM | POA: Diagnosis not present

## 2024-04-14 DIAGNOSIS — N1832 Chronic kidney disease, stage 3b: Secondary | ICD-10-CM | POA: Diagnosis not present

## 2024-04-14 DIAGNOSIS — Z79899 Other long term (current) drug therapy: Secondary | ICD-10-CM | POA: Diagnosis not present

## 2024-04-14 DIAGNOSIS — M1611 Unilateral primary osteoarthritis, right hip: Secondary | ICD-10-CM | POA: Diagnosis not present

## 2024-04-14 LAB — BASIC METABOLIC PANEL WITH GFR
Anion gap: 10 (ref 5–15)
BUN: 16 mg/dL (ref 8–23)
CO2: 25 mmol/L (ref 22–32)
Calcium: 8.4 mg/dL — ABNORMAL LOW (ref 8.9–10.3)
Chloride: 100 mmol/L (ref 98–111)
Creatinine, Ser: 0.98 mg/dL (ref 0.44–1.00)
GFR, Estimated: 59 mL/min — ABNORMAL LOW (ref >60)
Glucose, Bld: 174 mg/dL — ABNORMAL HIGH (ref 70–99)
Potassium: 3 mmol/L — ABNORMAL LOW (ref 3.5–5.1)
Sodium: 135 mmol/L (ref 135–145)

## 2024-04-14 MED ORDER — ACETAMINOPHEN 500 MG PO TABS
1000.0000 mg | ORAL_TABLET | Freq: Four times a day (QID) | ORAL | Status: AC
  Administered 2024-04-14 – 2024-04-15 (×2): 1000 mg via ORAL
  Filled 2024-04-14 (×3): qty 2

## 2024-04-14 MED ORDER — METHOCARBAMOL 500 MG PO TABS
500.0000 mg | ORAL_TABLET | Freq: Four times a day (QID) | ORAL | Status: DC | PRN
  Administered 2024-04-14 – 2024-04-17 (×4): 500 mg via ORAL
  Filled 2024-04-14 (×4): qty 1

## 2024-04-14 MED ORDER — OXYCODONE HCL 5 MG PO TABS
5.0000 mg | ORAL_TABLET | ORAL | Status: DC | PRN
  Administered 2024-04-15 – 2024-04-17 (×5): 5 mg via ORAL
  Filled 2024-04-14 (×5): qty 1

## 2024-04-14 MED ORDER — METOCLOPRAMIDE HCL 5 MG/ML IJ SOLN: 5.0000 mg | Freq: Three times a day (TID) | INTRAMUSCULAR | Status: DC | PRN

## 2024-04-14 MED ORDER — CEFAZOLIN SODIUM-DEXTROSE 2-4 GM/100ML-% IV SOLN
2.0000 g | Freq: Three times a day (TID) | INTRAVENOUS | Status: AC
  Administered 2024-04-14 (×2): 2 g via INTRAVENOUS
  Filled 2024-04-14 (×2): qty 100

## 2024-04-14 MED ORDER — HYDROMORPHONE HCL 1 MG/ML IJ SOLN: 0.5000 mg | INTRAMUSCULAR | Status: DC | PRN

## 2024-04-14 MED ORDER — METHOCARBAMOL 1000 MG/10ML IJ SOLN
500.0000 mg | Freq: Four times a day (QID) | INTRAMUSCULAR | Status: DC | PRN
  Administered 2024-04-15: 500 mg via INTRAVENOUS
  Filled 2024-04-14: qty 10

## 2024-04-14 MED ORDER — METOCLOPRAMIDE HCL 5 MG PO TABS: 5.0000 mg | ORAL_TABLET | Freq: Three times a day (TID) | ORAL | Status: DC | PRN

## 2024-04-14 MED ADMIN — Potassium Chloride Microencapsulated Crys ER Tab 20 mEq: 20 meq | ORAL | NDC 00245531989

## 2024-04-14 MED FILL — Potassium Chloride Microencapsulated Crys ER Tab 20 mEq: 20.0000 meq | ORAL | Qty: 1 | Status: AC

## 2024-04-14 NOTE — TOC Initial Note (Signed)
 Transition of Care [facility]) - Initial/Assessment Note    Patient Details  Name: Sharon Knight MRN: [id number] Date of Birth: 06-01-1945  [facility] CM/SW Contact:    Gwenn Julien [place], [place] Phone Number: 04/14/2024, 11:24 AM  Clinical Narrative: Met with pt at bedside re PT recommendation for SNF. Pt from home with her spouse who she reports is not able to physically assist her. Pt reports her friend Iva assists as needed but is not 24/7 assist. Pt with no prior SNF stay. Explained SNF placement process and answered questions. Pt with no facility preference and requested SW f/u with her friend Iva. Spoke to friend Iva who identified [facility] as preferred facility. Will being SNF search and f/u with offers as available.   Julien Gwenn, MSW, LCSW [phone] (coverage)                Expected Discharge Plan: Skilled Nursing Facility Barriers to Discharge: Insurance Authorization, SNF Pending bed offer   Patient Goals and CMS Choice   CMS Medicare.gov Compare Post Acute Care list provided to:: Patient Choice offered to / list presented to : Patient [place] ownership interest in [facility].provided to:: Patient    Expected Discharge Plan and Services     Post Acute Care Choice: Skilled Nursing Facility Living arrangements for the past 2 months: Single Family Home                                      Prior Living Arrangements/Services Living arrangements for the past 2 months: Single Family Home Lives with:: Spouse Patient language and need for interpreter reviewed:: Yes        Need for Family Participation in Patient Care: Yes (Comment) Care giver support system in place?: No (comment)   Criminal Activity/Legal Involvement Pertinent to Current Situation/Hospitalization: No - Comment as needed  Activities of Daily Living      Permission Sought/Granted Permission sought to share information with : Facility Games Developer granted to share information with : Yes, Verbal Permission Granted        Permission granted to share info w Relationship: Iva/Friend     Emotional Assessment       Orientation: : Oriented to Self, Oriented to Place, Oriented to  Time, Oriented to Situation Alcohol / Substance Use: Not Applicable Psych Involvement: No (comment)  Admission diagnosis:  Primary osteoarthritis of right hip [M16.11] S/P hip replacement, right [Z96.641] Patient Active Problem List   Diagnosis Date Noted   S/P hip replacement, right 04/13/2024   Arthritis of right hip 08/04/2023   Greater trochanteric bursitis, right 08/04/2023   RLS (restless legs syndrome) 06/09/2023   Sleep difficulties 06/09/2023   Low energy 06/09/2023   Type 2 diabetes mellitus with stage 3b chronic kidney disease, without long-term current use of insulin (HCC) 12/01/2022   Vertigo 10/23/2021   Exertional dyspnea 12/19/2020   Aortic atherosclerosis 10/16/2020   Acute pain of left knee 09/16/2020   Trigger finger, right ring finger 03/17/2019   CKD stage 3, 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 03/16/2019   Bilateral low back pain 03/17/2018   History of gout 12/21/2017   Lobar pneumonia 12/10/2017   Paroxysmal atrial fibrillation (HCC) 12/10/2017   History of colonic polyps 10/22/2014   History of nephrectomy, unilateral 11/13/2012   Vitamin D  deficiency 10/31/2012   ECZEMA 03/03/2010   Benign neoplasm  of colon 08/02/2007   Hyperlipidemia 08/02/2007   Essential hypertension 08/02/2007   Osteopenia 08/02/2007   PCP:  Geofm Glade PARAS, MD Pharmacy:   [facility] [place], [place] - [address] [facility] [address] [place] [address] Phone: [phone] Fax: [phone]     Social Drivers of Health (SDOH) Social History: SDOH Screenings   Food Insecurity: No Food Insecurity (10/29/2023)  Housing: Unknown (10/29/2023)  Transportation Needs: No  Transportation Needs (10/29/2023)  Utilities: Not At Risk (10/29/2023)  Alcohol Screen: Low Risk (10/29/2023)  Depression (PHQ2-9): Low Risk (10/29/2023)  Financial Resource Strain: Low Risk (10/29/2023)  Physical Activity: Inactive (10/29/2023)  Social Connections: Moderately Isolated (10/29/2023)  Stress: No Stress Concern Present (10/29/2023)  Tobacco Use: Medium Risk (04/13/2024)  Health Literacy: Adequate Health Literacy (10/29/2023)   SDOH Interventions:     Readmission Risk Interventions     No data to display

## 2024-04-14 NOTE — Plan of Care (Signed)

## 2024-04-14 NOTE — Progress Notes (Signed)
°  Subjective: Patient stable.  Patient did walk some yesterday but is having more pain this morning.   Objective: Vital signs in last 24 hours: Temp:  [97.5 F (36.4 C)-99.6 F (37.6 C)] 99 F (37.2 C) (12/12 0752) Pulse Rate:  [47-95] 74 (12/12 0752) Resp:  [12-20] 16 (12/12 0752) BP: (80-133)/(46-87) 119/53 (12/12 0752) SpO2:  [92 %-100 %] 97 % (12/12 0752)  Intake/Output from previous day: 12/11 0701 - 12/12 0700 In: 1250 [I.V.:800; IV Piggyback:450] Out: 800 [Urine:600; Blood:200] Intake/Output this shift: No intake/output data recorded.  Exam:  Leg lengths equal.  Dorsiflexion intact on the right.  Labs: No results for input(s): HGB in the last 72 hours. No results for input(s): WBC, RBC, HCT, PLT in the last 72 hours. Recent Labs    04/14/24 0437  NA 135  K 3.0*  CL 100  CO2 25  BUN 16  CREATININE 0.98  GLUCOSE 174*  CALCIUM  8.4*   No results for input(s): LABPT, INR in the last 72 hours.  Assessment/Plan: Impression is postop day 1 from hip replacement.  Radiographs look good.  Having expected amount of postop pain.  Some of the numbing medicine likely wore off last night.  We will see how she does with physical therapy today.  Do not anticipate discharge today.  Supplement potassium.   G Scott Nikkol Pai 04/14/2024, 8:07 AM

## 2024-04-14 NOTE — Plan of Care (Signed)

## 2024-04-14 NOTE — Progress Notes (Signed)
 Physical Therapy Treatment Patient Details Name: Sharon Knight MRN: [id number] DOB: [birth year] Today's Date: 04/14/2024   History of Present Illness Pt is 78 yo presenting to [facility] for scheduled R THA. PMH: sepsis, a-fib with RVR, HTN.    PT Comments  Pt tolerates treatment well after initial grogginess upon PT arrival. Pt reports increased pain and difficulty mobilizing yesterday after sitting up for an hour. Today the pt requires physical assistance to perform bed mobility and requires verbal and tactile cueing for each sit to stand transfer to improve efficiency, otherwise unable to stand on her own. PT provides education pertaining to the surgical hip exercise handout. Pt will benefit from continued acute PT services in an effort to improve independence in functional mobility and to improve activity tolerance. Concerns regarding discharge home remain present as the pt is unable to stand unassisted and seems to report that her spouse will not be able to consistently help with ADL management. Patient will benefit from continued inpatient follow up therapy, <3 hours/day.    If plan is discharge home, recommend the following: A lot of help with walking and/or transfers;A lot of help with bathing/dressing/bathroom;Assistance with cooking/housework;Assist for transportation;Help with stairs or ramp for entrance;Direct supervision/assist for medications management;Direct supervision/assist for financial management;Supervision due to cognitive status   Can travel by private vehicle     Yes  Equipment Recommendations  BSC/3in1    Recommendations for Other Services       Precautions / Restrictions Precautions Precautions: Fall Recall of Precautions/Restrictions: Intact Precaution/Restrictions Comments: direct anterior THA, no precautions Restrictions Weight Bearing Restrictions Per Provider Order: Yes RLE Weight Bearing Per Provider Order: Weight bearing as tolerated     Mobility  Bed  Mobility Overal bed mobility: Needs Assistance Bed Mobility: Supine to Sit     Supine to sit: Min assist, HOB elevated, Used rails     General bed mobility comments: assist to pivot hips and hand hold to pull when scooting to edge of bed    Transfers Overall transfer level: Needs assistance Equipment used: Rolling walker (2 wheels) Transfers: Sit to/from Stand Sit to Stand: Min assist           General transfer comment: pt requries verbal cues for hand placement and to increase anterior weight shift for transfer initiation. Pt demonstrates poor retention of these cues within session and consistently requires them for each sit to stand    Ambulation/Gait Ambulation/Gait assistance: Contact guard assist Gait Distance (Feet): 60 Feet (60' x 2) Assistive device: Rolling walker (2 wheels) Gait Pattern/deviations: Step-to pattern Gait velocity: reduced Gait velocity interpretation: <1.31 ft/sec, indicative of household ambulator   General Gait Details: step-to gait progressing to step-through with verbal cues. PT notes RLE external rotation which pt is able to reduce but not fully correct with verbal cueing. PT will continue to reinforce the need for more neutral hip rotation during gait cycle   Stairs             Wheelchair Mobility     Tilt Bed    Modified Rankin (Stroke Patients Only)       Balance Overall balance assessment: Needs assistance Sitting-balance support: No upper extremity supported, Feet supported Sitting balance-Leahy Scale: Fair     Standing balance support: Bilateral upper extremity supported, Reliant on assistive device for balance Standing balance-Leahy Scale: Poor  Communication Communication Communication: No apparent difficulties  Cognition Arousal: Alert Behavior During Therapy: WFL for tasks assessed/performed   PT - Cognitive impairments: No apparent impairments                        PT - Cognition Comments: groggy initially but arouses with mobility Following commands: Impaired Following commands impaired: Follows one step commands with increased time, Follows multi-step commands inconsistently    Cueing Cueing Techniques: Verbal cues, Tactile cues, Visual cues  Exercises Other Exercises Other Exercises: PT provides education on surgical hip exercise packet. Pt performs ankle pumps x 10, AAROM R hip marches in sitting, 5 reps bilateral hip abduction/adduction in sitting.    General Comments General comments (skin integrity, edema, etc.): pt in NAD      Pertinent Vitals/Pain Pain Assessment Pain Assessment: Faces Faces Pain Scale: Hurts even more Pain Location: R hip at incision site Pain Descriptors / Indicators: Sore Pain Intervention(s): Limited activity within patient's tolerance    Home Living                          Prior Function            PT Goals (current goals can now be found in the care plan section) Acute Rehab PT Goals Patient Stated Goal: to improve mobility prior to returning home. Progress towards PT goals: Progressing toward goals    Frequency    7X/week      PT Plan      Co-evaluation              AM-PAC PT 6 Clicks Mobility   Outcome Measure  Help needed turning from your back to your side while in a flat bed without using bedrails?: A Little Help needed moving from lying on your back to sitting on the side of a flat bed without using bedrails?: A Little Help needed moving to and from a bed to a chair (including a wheelchair)?: A Little Help needed standing up from a chair using your arms (e.g., wheelchair or bedside chair)?: A Little Help needed to walk in hospital room?: A Little Help needed climbing 3-5 steps with a railing? : Total 6 Click Score: 16    End of Session Equipment Utilized During Treatment: Gait belt Activity Tolerance: Patient tolerated treatment well Patient left: in  chair;with call bell/phone within reach;with chair alarm set Nurse Communication: Mobility status PT Visit Diagnosis: Unsteadiness on feet (R26.81);Other abnormalities of gait and mobility (R26.89);Muscle weakness (generalized) (M62.81);Pain Pain - Right/Left: Right Pain - part of body: Hip     Time: 0927-1005 PT Time Calculation (min) (ACUTE ONLY): 38 min  Charges:    $Gait Training: 8-22 mins $Therapeutic Exercise: 8-22 mins $Therapeutic Activity: 8-22 mins PT General Charges $$ ACUTE PT VISIT: 1 Visit                     Bernardino JINNY Ruth, PT, DPT Acute Rehabilitation Office [phone]    Bernardino JINNY Ruth 04/14/2024, 10:50 AM

## 2024-04-14 NOTE — Progress Notes (Signed)
 Mobility Specialist Progress Note:    04/14/24 1110  Mobility  Activity Pivoted/transferred from bed to chair  Level of Assistance Minimal assist, patient does 75% or more (+2)  Assistive Device Front wheel walker  Distance Ambulated (ft) 4 ft  RLE Weight Bearing Per Provider Order WBAT  Activity Response Tolerated fair  Mobility Referral Yes  Mobility visit 1 Mobility  Mobility Specialist Start Time (ACUTE ONLY) 1057  Mobility Specialist Stop Time (ACUTE ONLY) 1107  Mobility Specialist Time Calculation (min) (ACUTE ONLY) 10 min   Pt received standing next to chair w/ chair alarm going off, requesting to get back to bed. Required MinA +2 and 2 person HHA to get back to bed. Pt w/ excessive forward lean w/ rounded shoulders in back unable to fix with mod multimodal cues. Returned to bed w/ call bell and personal belongings in reach. All needs met. Bed alarm on.   Thersia Minder Mobility Specialist  Please contact vis Secure Chat or  Rehab Office [phone]

## 2024-04-14 NOTE — Care Management Obs Status (Signed)
 MEDICARE OBSERVATION STATUS NOTIFICATION   Patient Details  Name: Sharon Knight MRN: [id number] Date of Birth: 1945/09/22   Medicare Observation Status Notification Given:  Yes    Jennie Laneta Dragon 04/14/2024, 1:15 PM

## 2024-04-14 NOTE — [place] FL2 (Signed)
 [place]  MEDICAID FL2 LEVEL OF CARE FORM     IDENTIFICATION  Patient Name: Sharon Knight Birthdate: 10-13-1945 Sex: female Admission Date (Current Location): 04/13/2024  [facility] and [place] Number:  Producer, Television/film/video and Address:  [facility] [place], [address] [place], [place] [address]      Provider Number: [id number]  Attending Physician Name and Address:  Addie Cordella Hamilton, MD  Relative Name and Phone Number:       Current Level of Care: Hospital Recommended Level of Care: Skilled Nursing Facility Prior Approval Number:    Date Approved/Denied:   PASRR Number: [id number]  Discharge Plan: SNF    Current Diagnoses: Patient Active Problem List   Diagnosis Date Noted   S/P hip replacement, right 04/13/2024   Arthritis of right hip 08/04/2023   Greater trochanteric bursitis, right 08/04/2023   RLS (restless legs syndrome) 06/09/2023   Sleep difficulties 06/09/2023   Low energy 06/09/2023   Type 2 diabetes mellitus with stage 3b chronic kidney disease, without long-term current use of insulin (HCC) 12/01/2022   Vertigo 10/23/2021   Exertional dyspnea 12/19/2020   Aortic atherosclerosis 10/16/2020   Acute pain of left knee 09/16/2020   Trigger finger, right ring finger 03/17/2019   CKD stage 3, 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 03/16/2019   Bilateral low back pain 03/17/2018   History of gout 12/21/2017   Lobar pneumonia 12/10/2017   Paroxysmal atrial fibrillation (HCC) 12/10/2017   History of colonic polyps 10/22/2014   History of nephrectomy, unilateral 11/13/2012   Vitamin D  deficiency 10/31/2012   ECZEMA 03/03/2010   Benign neoplasm of colon 08/02/2007   Hyperlipidemia 08/02/2007   Essential hypertension 08/02/2007   Osteopenia 08/02/2007    Orientation RESPIRATION BLADDER Height & Weight     Self, Time, Place, Situation  Normal Continent Weight: 160 lb (72.6 kg) Height:  5' 3 (160 cm)  BEHAVIORAL SYMPTOMS/MOOD  NEUROLOGICAL BOWEL NUTRITION STATUS      Continent    AMBULATORY STATUS COMMUNICATION OF NEEDS Skin   Extensive Assist   Surgical wounds                       Personal Care Assistance Level of Assistance  Bathing, Feeding, Dressing Bathing Assistance: Limited assistance Feeding assistance: Limited assistance Dressing Assistance: Limited assistance     Functional Limitations Info  Sight, Hearing, Speech Sight Info: Impaired Hearing Info: Adequate Speech Info: Adequate    SPECIAL CARE FACTORS FREQUENCY  PT (By licensed PT), OT (By licensed OT)                    Contractures Contractures Info: Not present    Additional Factors Info  Code Status Code Status Info: FULL CODE             Current Medications (04/14/2024):  This is the current hospital active medication list Current Facility-Administered Medications  Medication Dose Route Frequency Provider Last Rate Last Admin   0.9 %  sodium chloride  infusion   Intravenous Once Magnant, Charles L, PA-C       acetaminophen  (TYLENOL ) tablet 325-650 mg  325-650 mg Oral Q6H PRN Magnant, Charles L, PA-C   650 mg at 04/14/24 0715   apixaban  (ELIQUIS ) tablet 5 mg  5 mg Oral BID Magnant, Charles L, PA-C   5 mg at 04/14/24 0831   diltiazem  (CARDIZEM  CD) 24 hr capsule 180 mg  180 mg Oral Daily Magnant, Charles  L, PA-C   180 mg at 04/14/24 0831   docusate sodium (COLACE) capsule 100 mg  100 mg Oral BID Magnant, Charles L, PA-C   100 mg at 04/13/24 2223   flecainide  (TAMBOCOR ) tablet 100 mg  100 mg Oral BID Magnant, Charles L, PA-C   100 mg at 04/14/24 9170   hydrochlorothiazide  (HYDRODIURIL ) tablet 25 mg  25 mg Oral Daily Magnant, Charles L, PA-C   25 mg at 04/14/24 9170   irbesartan (AVAPRO) tablet 37.5 mg  37.5 mg Oral Daily Magnant, Charles L, PA-C   37.5 mg at 04/14/24 0830   menthol (CEPACOL) lozenge 3 mg  1 lozenge Oral PRN Magnant, Charles L, PA-C       Or   phenol (CHLORASEPTIC) mouth spray 1 spray  1 spray  Mouth/Throat PRN Magnant, Charles L, PA-C       potassium chloride  SA (KLOR-CON  M) CR tablet 20 mEq  20 mEq Oral BID Addie Cordella Hamilton, MD   20 mEq at 04/14/24 9062     Discharge Medications: Please see discharge summary for a list of discharge medications.  Relevant Imaging Results:  Relevant Lab Results:   Additional Information SS# [id number]  Sharon Knight [place], [place]

## 2024-04-15 DIAGNOSIS — Z7901 Long term (current) use of anticoagulants: Secondary | ICD-10-CM | POA: Diagnosis not present

## 2024-04-15 DIAGNOSIS — Z85828 Personal history of other malignant neoplasm of skin: Secondary | ICD-10-CM | POA: Diagnosis not present

## 2024-04-15 DIAGNOSIS — M1611 Unilateral primary osteoarthritis, right hip: Secondary | ICD-10-CM | POA: Diagnosis not present

## 2024-04-15 DIAGNOSIS — Z79899 Other long term (current) drug therapy: Secondary | ICD-10-CM | POA: Diagnosis not present

## 2024-04-15 DIAGNOSIS — E1122 Type 2 diabetes mellitus with diabetic chronic kidney disease: Secondary | ICD-10-CM | POA: Diagnosis not present

## 2024-04-15 DIAGNOSIS — I129 Hypertensive chronic kidney disease with stage 1 through stage 4 chronic kidney disease, or unspecified chronic kidney disease: Secondary | ICD-10-CM | POA: Diagnosis not present

## 2024-04-15 DIAGNOSIS — Z87891 Personal history of nicotine dependence: Secondary | ICD-10-CM | POA: Diagnosis not present

## 2024-04-15 DIAGNOSIS — N1832 Chronic kidney disease, stage 3b: Secondary | ICD-10-CM | POA: Diagnosis not present

## 2024-04-15 MED ORDER — DOCUSATE SODIUM 100 MG PO CAPS
300.0000 mg | ORAL_CAPSULE | Freq: Every day | ORAL | Status: DC
  Administered 2024-04-15 – 2024-04-16 (×2): 300 mg via ORAL
  Filled 2024-04-15 (×2): qty 3

## 2024-04-15 MED ADMIN — Potassium Chloride Microencapsulated Crys ER Tab 20 mEq: 20 meq | ORAL | NDC 00245531989

## 2024-04-15 NOTE — TOC Progression Note (Addendum)
 Transition of Care [facility]) - Progression Note    Patient Details  Name: Sharon Knight MRN: [id number] Date of Birth: 09/23/1945  [facility]) CM/SW Contact  Bridget Cordella Simmonds, LCSW Phone Number: 04/15/2024, 10:19 AM  Clinical Narrative:   Bed offers provided to pt, she asked CSW to call friend Iris, able to Iris on the phone, they discussed offers and will accept offer at [facility].  1100: [facility] confirms she can receive pt.    Expected Discharge Plan: Skilled Nursing Facility Barriers to Discharge: English As A Second Language Teacher, SNF Pending bed offer               Expected Discharge Plan and Services     Post Acute Care Choice: Skilled Nursing Facility Living arrangements for the past 2 months: Single Family Home                                       Social Drivers of Health (SDOH) Interventions SDOH Screenings   Food Insecurity: No Food Insecurity (10/29/2023)  Housing: Unknown (10/29/2023)  Transportation Needs: No Transportation Needs (10/29/2023)  Utilities: Not At Risk (10/29/2023)  Alcohol Screen: Low Risk (10/29/2023)  Depression (PHQ2-9): Low Risk (10/29/2023)  Financial Resource Strain: Low Risk (10/29/2023)  Physical Activity: Inactive (10/29/2023)  Social Connections: Moderately Isolated (10/29/2023)  Stress: No Stress Concern Present (10/29/2023)  Tobacco Use: Medium Risk (04/13/2024)  Health Literacy: Adequate Health Literacy (10/29/2023)    Readmission Risk Interventions     No data to display

## 2024-04-15 NOTE — Progress Notes (Signed)
°  Subjective: Patient stable.  Was able to walk some yesterday but with assist.  Pain controlled.  Would like to have her stool softeners adjusted.   Objective: Vital signs in last 24 hours: Temp:  [98.3 F (36.8 C)-99 F (37.2 C)] 98.5 F (36.9 C) (12/13 0410) Pulse Rate:  [72-76] 75 (12/13 0410) Resp:  [15-17] 15 (12/13 0410) BP: (119-127)/(50-56) 126/50 (12/13 0410) SpO2:  [94 %-97 %] 97 % (12/13 0410)  Intake/Output from previous day: 12/12 0701 - 12/13 0700 In: 240 [P.O.:240] Out: -  Intake/Output this shift: No intake/output data recorded.  Exam:  Sensation intact distally Intact pulses distally Dorsiflexion/Plantar flexion intact  Labs: No results for input(s): HGB in the last 72 hours. No results for input(s): WBC, RBC, HCT, PLT in the last 72 hours. Recent Labs    04/14/24 0437  NA 135  K 3.0*  CL 100  CO2 25  BUN 16  CREATININE 0.98  GLUCOSE 174*  CALCIUM  8.4*   No results for input(s): LABPT, INR in the last 72 hours.  Assessment/Plan: Plan at this time is physical therapy today.  FL 2 form has been signed.  Anticipate discharge to skilled nursing on Monday.   Sharon Knight 04/15/2024, 7:45 AM

## 2024-04-15 NOTE — Progress Notes (Signed)
 Physical Therapy Treatment Patient Details Name: Sharon Knight MRN: [id number] DOB: [birth year] Today's Date: 04/15/2024   History of Present Illness Pt is 78 yo presenting to [facility] for scheduled R THA. PMH: sepsis, a-fib with RVR, HTN.    PT Comments  Pt supine in bed on arrival. She reports need to urinate.  Pt more painful with movement this session.  Pt required active assistance to perform all LE exercises.  Pt require min to mod assistance this session.  Continue to recommend rehab in a post acute setting.      If plan is discharge home, recommend the following: A lot of help with walking and/or transfers;A lot of help with bathing/dressing/bathroom;Assistance with cooking/housework;Assist for transportation;Help with stairs or ramp for entrance;Direct supervision/assist for medications management;Direct supervision/assist for financial management;Supervision due to cognitive status   Can travel by private vehicle     Yes  Equipment Recommendations  BSC/3in1    Recommendations for Other Services       Precautions / Restrictions Precautions Precautions: Fall Recall of Precautions/Restrictions: Intact Precaution/Restrictions Comments: direct anterior THA, no precautions Restrictions Weight Bearing Restrictions Per Provider Order: Yes RLE Weight Bearing Per Provider Order: Weight bearing as tolerated     Mobility  Bed Mobility Overal bed mobility: Needs Assistance Bed Mobility: Supine to Sit     Supine to sit: Min assist     General bed mobility comments: assist to pivot hips and hand hold to pull when scooting to edge of bed, min assistance to elevate trunk into seated position.    Transfers Overall transfer level: Needs assistance Equipment used: Rolling walker (2 wheels) Transfers: Sit to/from Stand Sit to Stand: Min assist           General transfer comment: Cues for hand placement to and from seated surface.  Presents with ER of R hip and poor eccentric  load to seated surface.  Cues for RLE advancement forward but lacks ability to follow commands.  Increased pain with flexion into seated position.    Ambulation/Gait Ambulation/Gait assistance: Min assist Gait Distance (Feet): 12 Feet (x2) Assistive device: Rolling walker (2 wheels) Gait Pattern/deviations: Step-to pattern Gait velocity: reduced     General Gait Details: Performed from bed to bathroom and back.  ER noted on R LE hip.  Cues for RW position and to increase step length on R.   Stairs             Wheelchair Mobility     Tilt Bed    Modified Rankin (Stroke Patients Only)       Balance Overall balance assessment: Needs assistance Sitting-balance support: No upper extremity supported, Feet supported Sitting balance-Leahy Scale: Fair     Standing balance support: Bilateral upper extremity supported, Reliant on assistive device for balance Standing balance-Leahy Scale: Poor Standing balance comment: CGA to Min A external support.                            Communication Communication Communication: Impaired Factors Affecting Communication: Hearing impaired (does not wear hearing aids)  Cognition Arousal: Alert Behavior During Therapy: WFL for tasks assessed/performed   PT - Cognitive impairments: No apparent impairments                       PT - Cognition Comments: groggy initially but arouses with mobility Following commands: Impaired Following commands impaired: Follows one step commands with increased time, Follows multi-step commands inconsistently  Cueing Cueing Techniques: Verbal cues, Tactile cues, Visual cues  Exercises Total Joint Exercises Ankle Circles/Pumps: AROM, Both, 20 reps, Supine Quad Sets: AROM, 10 reps, Supine, Right Heel Slides: AAROM, Right, 10 reps, Supine Hip ABduction/ADduction: AAROM, Right, 10 reps, Supine Straight Leg Raises: AAROM, Right, 10 reps, Supine    General Comments         Pertinent Vitals/Pain Pain Assessment Pain Assessment: 0-10 Pain Score: 6  Pain Location: R hip at incision site Pain Intervention(s): Monitored during session, Repositioned    Home Living                          Prior Function            PT Goals (current goals can now be found in the care plan section) Acute Rehab PT Goals Patient Stated Goal: to improve mobility prior to returning home. Potential to Achieve Goals: Good Progress towards PT goals: Progressing toward goals    Frequency    7X/week      PT Plan      Co-evaluation              AM-PAC PT 6 Clicks Mobility   Outcome Measure  Help needed turning from your back to your side while in a flat bed without using bedrails?: A Little Help needed moving from lying on your back to sitting on the side of a flat bed without using bedrails?: A Lot Help needed moving to and from a bed to a chair (including a wheelchair)?: A Lot Help needed standing up from a chair using your arms (e.g., wheelchair or bedside chair)?: A Lot Help needed to walk in hospital room?: A Lot   6 Click Score: 11    End of Session Equipment Utilized During Treatment: Gait belt Activity Tolerance: Patient tolerated treatment well Patient left: in chair;with call bell/phone within reach;with chair alarm set Nurse Communication: Mobility status PT Visit Diagnosis: Unsteadiness on feet (R26.81);Other abnormalities of gait and mobility (R26.89);Muscle weakness (generalized) (M62.81);Pain Pain - Right/Left: Right     Time: 8676-8645 PT Time Calculation (min) (ACUTE ONLY): 31 min  Charges:    $Gait Training: 8-22 mins $Therapeutic Exercise: 8-22 mins PT General Charges $$ ACUTE PT VISIT: 1 Visit                     Toya HAMS , PTA Acute Rehabilitation Services Office [phone]    Toya JINNY Gosling 04/15/2024, 2:03 PM

## 2024-04-16 DIAGNOSIS — N1832 Chronic kidney disease, stage 3b: Secondary | ICD-10-CM | POA: Diagnosis not present

## 2024-04-16 DIAGNOSIS — Z79899 Other long term (current) drug therapy: Secondary | ICD-10-CM | POA: Diagnosis not present

## 2024-04-16 DIAGNOSIS — Z87891 Personal history of nicotine dependence: Secondary | ICD-10-CM | POA: Diagnosis not present

## 2024-04-16 DIAGNOSIS — Z7901 Long term (current) use of anticoagulants: Secondary | ICD-10-CM | POA: Diagnosis not present

## 2024-04-16 DIAGNOSIS — M1611 Unilateral primary osteoarthritis, right hip: Secondary | ICD-10-CM | POA: Diagnosis not present

## 2024-04-16 DIAGNOSIS — Z85828 Personal history of other malignant neoplasm of skin: Secondary | ICD-10-CM | POA: Diagnosis not present

## 2024-04-16 DIAGNOSIS — E1122 Type 2 diabetes mellitus with diabetic chronic kidney disease: Secondary | ICD-10-CM | POA: Diagnosis not present

## 2024-04-16 DIAGNOSIS — I129 Hypertensive chronic kidney disease with stage 1 through stage 4 chronic kidney disease, or unspecified chronic kidney disease: Secondary | ICD-10-CM | POA: Diagnosis not present

## 2024-04-16 MED ADMIN — Potassium Chloride Microencapsulated Crys ER Tab 20 mEq: 20 meq | ORAL | NDC 00245531989

## 2024-04-16 NOTE — Progress Notes (Signed)
°  Subjective: Patient resting.  Was able to ambulate with PT and assist yesterday.  Pain controlled.   Objective: Vital signs in last 24 hours: Temp:  [98.3 F (36.8 C)-99 F (37.2 C)] 99 F (37.2 C) (12/14 0812) Pulse Rate:  [69-78] 78 (12/14 0812) Resp:  [18-19] 18 (12/14 0812) BP: (98-125)/(38-60) 123/56 (12/14 0812) SpO2:  [97 %-100 %] 99 % (12/14 0812)  Intake/Output from previous day: 12/13 0701 - 12/14 0700 In: 720 [P.O.:720] Out: -  Intake/Output this shift: No intake/output data recorded.  Exam:  Sensation intact distally Intact pulses distally Dorsiflexion/Plantar flexion intact  Labs: No results for input(s): HGB in the last 72 hours. No results for input(s): WBC, RBC, HCT, PLT in the last 72 hours. Recent Labs    04/14/24 0437  NA 135  K 3.0*  CL 100  CO2 25  BUN 16  CREATININE 0.98  GLUCOSE 174*  CALCIUM  8.4*   No results for input(s): LABPT, INR in the last 72 hours.  Assessment/Plan: Plan at this time remains physical therapy with Anticipate discharge to skilled nursing on Monday.   Sharon Knight 04/16/2024, 11:20 AM

## 2024-04-16 NOTE — TOC Progression Note (Signed)
 Transition of Care [facility]) - Progression Note    Patient Details  Name: Sharon Knight MRN: [id number] Date of Birth: 14-Dec-1945  Transition of Care [facility] CM/SW Contact  Dino CHRISTELLA Au, LCSWA Phone Number: 04/16/2024, 3:08 PM  Clinical Narrative:     SW spoke with Tiffany Hugh [phone]) to start auth. Mzq#[id number]  Clinicals faxed to: [phone]  Expected Discharge Plan: Skilled Nursing Facility Barriers to Discharge: Insurance Authorization, SNF Pending bed offer   Expected Discharge Plan and Services     Post Acute Care Choice: Skilled Nursing Facility Living arrangements for the past 2 months: Single Family Home                                       Social Drivers of Health (SDOH) Interventions SDOH Screenings   Food Insecurity: No Food Insecurity (04/15/2024)  Housing: Low Risk (04/15/2024)  Transportation Needs: No Transportation Needs (04/15/2024)  Utilities: Not At Risk (04/15/2024)  Alcohol Screen: Low Risk (10/29/2023)  Depression (PHQ2-9): Low Risk (10/29/2023)  Financial Resource Strain: Low Risk (10/29/2023)  Physical Activity: Inactive (10/29/2023)  Social Connections: Moderately Isolated (04/15/2024)  Stress: No Stress Concern Present (10/29/2023)  Tobacco Use: Medium Risk (04/13/2024)  Health Literacy: Adequate Health Literacy (10/29/2023)    Readmission Risk Interventions     No data to display

## 2024-04-16 NOTE — Progress Notes (Signed)
 Physical Therapy Treatment Patient Details Name: Sharon Knight MRN: [id number] DOB: [birth year] Today's Date: 04/16/2024   History of Present Illness Pt is 78 yo presenting to [facility] for scheduled R THA. PMH: sepsis, a-fib with RVR, HTN.    PT Comments  Pt requiring increased assist this date for mobility with moderate pain in R hip. Pt had increased difficulty follow commands with step by step cueing needed for sequencing. Pt was able to stand with ModA and use of RW. Pt was initially able to take steps with MinA for steadying assist and RW. Step to gait pattern with difficulty advancing R LE with increased distance. Pt reported fatigue after ambulating 74ft with a sudden LOB requiring TotalA to safely lower to the EOB. After a few minutes resting in seated position, pt required ModA to return to supine. Able to perform a few exercises before desiring to rest. Continue to recommend <3hrs post acute rehab with acute PT to follow.     If plan is discharge home, recommend the following: A lot of help with walking and/or transfers;A lot of help with bathing/dressing/bathroom;Assistance with cooking/housework;Assist for transportation;Help with stairs or ramp for entrance;Direct supervision/assist for medications management;Direct supervision/assist for financial management;Supervision due to cognitive status   Can travel by private vehicle     No  Equipment Recommendations  BSC/3in1       Precautions / Restrictions Precautions Precautions: Fall Recall of Precautions/Restrictions: Intact Precaution/Restrictions Comments: direct anterior THA, no precautions Restrictions Weight Bearing Restrictions Per Provider Order: Yes RLE Weight Bearing Per Provider Order: Weight bearing as tolerated     Mobility  Bed Mobility Overal bed mobility: Needs Assistance Bed Mobility: Sit to Supine, Supine to Sit    Supine to sit: Mod assist Sit to supine: Max assist   General bed mobility comments:  increased time and effort to bring LE's towards EOB with assist needed to guide R LE. Assist to also raise trunk. MaxA to return to supine with assist for BLE    Transfers Overall transfer level: Needs assistance Equipment used: Rolling walker (2 wheels) Transfers: Sit to/from Stand Sit to Stand: Mod assist    General transfer comment: ModA for boost-up and steadying assist. Cues for hand placement with pt trying to put hands on RW handles.    Ambulation/Gait Ambulation/Gait assistance: Min assist, Total assist Gait Distance (Feet): 14 Feet Assistive device: Rolling walker (2 wheels) Gait Pattern/deviations: Step-to pattern Gait velocity: reduced    General Gait Details: Slowed step-to pattern with R LE positioned in ER. With increased distance, pt had forward flexed posture with difficulty advancing R LE. Cues for increased step length and upright posture. Pt became fatigued with a loss of balance requiring TotalA to safely lower pt onto the EOB     Balance Overall balance assessment: Needs assistance Sitting-balance support: No upper extremity supported, Feet supported Sitting balance-Leahy Scale: Fair   Postural control: Posterior lean Standing balance support: Bilateral upper extremity supported, Reliant on assistive device for balance Standing balance-Leahy Scale: Poor     Communication Communication Communication: Impaired Factors Affecting Communication: Hearing impaired  Cognition Arousal: Alert Behavior During Therapy: WFL for tasks assessed/performed   PT - Cognitive impairments: No family/caregiver present to determine baseline, Awareness, Memory, Sequencing, Problem solving, Safety/Judgement    PT - Cognition Comments: Required frequent cueing and repetition for commands. Increased difficulty sequencing mobility this date Following commands: Impaired Following commands impaired: Follows one step commands with increased time, Follows multi-step commands  inconsistently    Cueing  Cueing Techniques: Verbal cues, Tactile cues, Visual cues  Exercises Total Joint Exercises Ankle Circles/Pumps: AROM, Both, 10 reps, Supine Other Exercises Other Exercises: x5 bridges in supine        Pertinent Vitals/Pain Pain Assessment Pain Assessment: Faces Faces Pain Scale: Hurts little more Pain Location: R hip at incision site Pain Descriptors / Indicators: Sore Pain Intervention(s): Limited activity within patient's tolerance, Monitored during session, Repositioned     PT Goals (current goals can now be found in the care plan section) Acute Rehab PT Goals PT Goal Formulation: With patient Time For Goal Achievement: 04/27/24 Potential to Achieve Goals: Good Progress towards PT goals: Progressing toward goals    Frequency    7X/week       AM-PAC PT 6 Clicks Mobility   Outcome Measure  Help needed turning from your back to your side while in a flat bed without using bedrails?: A Lot Help needed moving from lying on your back to sitting on the side of a flat bed without using bedrails?: A Lot Help needed moving to and from a bed to a chair (including a wheelchair)?: A Lot Help needed standing up from a chair using your arms (e.g., wheelchair or bedside chair)?: A Lot Help needed to walk in hospital room?: Total Help needed climbing 3-5 steps with a railing? : Total 6 Click Score: 10    End of Session Equipment Utilized During Treatment: Gait belt Activity Tolerance: Patient limited by pain;Patient limited by fatigue Patient left: in bed;with call bell/phone within reach;with bed alarm set Nurse Communication: Mobility status;Need for lift equipment PT Visit Diagnosis: Unsteadiness on feet (R26.81);Other abnormalities of gait and mobility (R26.89);Muscle weakness (generalized) (M62.81);Pain Pain - Right/Left: Right Pain - part of body: Hip     Time: 8447-8371 PT Time Calculation (min) (ACUTE ONLY): 36 min  Charges:    $Gait  Training: 8-22 mins $Therapeutic Activity: 8-22 mins PT General Charges $$ ACUTE PT VISIT: 1 Visit                    Kate ORN, PT, DPT Secure Chat Preferred  Rehab Office [phone]   Kate BRAVO Sharon Knight 04/16/2024, 5:18 PM

## 2024-04-17 DIAGNOSIS — N1832 Chronic kidney disease, stage 3b: Secondary | ICD-10-CM | POA: Diagnosis not present

## 2024-04-17 DIAGNOSIS — Z79899 Other long term (current) drug therapy: Secondary | ICD-10-CM | POA: Diagnosis not present

## 2024-04-17 DIAGNOSIS — E1122 Type 2 diabetes mellitus with diabetic chronic kidney disease: Secondary | ICD-10-CM | POA: Diagnosis not present

## 2024-04-17 DIAGNOSIS — Z85828 Personal history of other malignant neoplasm of skin: Secondary | ICD-10-CM | POA: Diagnosis not present

## 2024-04-17 DIAGNOSIS — M1611 Unilateral primary osteoarthritis, right hip: Secondary | ICD-10-CM | POA: Diagnosis not present

## 2024-04-17 DIAGNOSIS — I129 Hypertensive chronic kidney disease with stage 1 through stage 4 chronic kidney disease, or unspecified chronic kidney disease: Secondary | ICD-10-CM | POA: Diagnosis not present

## 2024-04-17 DIAGNOSIS — Z87891 Personal history of nicotine dependence: Secondary | ICD-10-CM | POA: Diagnosis not present

## 2024-04-17 DIAGNOSIS — Z7901 Long term (current) use of anticoagulants: Secondary | ICD-10-CM | POA: Diagnosis not present

## 2024-04-17 MED ORDER — OXYCODONE HCL 5 MG PO TABS: 5.0000 mg | ORAL_TABLET | ORAL | 0 refills | Status: DC | PRN

## 2024-04-17 MED ORDER — METHOCARBAMOL 500 MG PO TABS: 500.0000 mg | ORAL_TABLET | Freq: Three times a day (TID) | ORAL | 0 refills | Status: DC | PRN

## 2024-04-17 MED ADMIN — Potassium Chloride Microencapsulated Crys ER Tab 20 mEq: 20 meq | ORAL | @ 09:00:00 | NDC 00245531989

## 2024-04-17 NOTE — Progress Notes (Signed)
 Patient doing okay today.  No chest pain or shortness of breath.  Had some difficulty ambulating with physical therapy.  She is approved for discharge to skilled nursing facility today.  Denies any calf pain or any symptoms aside from hip/thigh pain and feeling cold intermittently.  No fevers.  On exam, dressing is clean dry and intact without gross blood or drainage.  No effusion in the right knee.  No calf tenderness bilaterally.  Negative Homans' sign bilaterally.  Palpable DP pulse of the operative extremity.  Intact ankle dorsiflexion plantarflexion.  Plan at this time is discharged to skilled nursing facility.  Discharge orders placed.  Patient will follow-up with Dr. Addie in clinic in about 1.5 weeks and recommended she reach out with any concerns in the meantime.

## 2024-04-17 NOTE — TOC Progression Note (Signed)
 Transition of Care [facility]) - Progression Note    Patient Details  Name: Sharon Knight MRN: [id number] Date of Birth: August 25, 1945  [facility]) CM/SW Contact  Bridget Cordella Simmonds, LCSW Phone Number: 04/17/2024, 8:51 AM  Clinical Narrative:   SNF auth approved: [id number], 3 days: 12/15-12/17.  CSW confirmed with [facility]: they can receive pt today.    MD notified.     Expected Discharge Plan: Skilled Nursing Facility Barriers to Discharge: English As A Second Language Teacher, SNF Pending bed offer               Expected Discharge Plan and Services     Post Acute Care Choice: Skilled Nursing Facility Living arrangements for the past 2 months: Single Family Home                                       Social Drivers of Health (SDOH) Interventions SDOH Screenings   Food Insecurity: No Food Insecurity (04/15/2024)  Housing: Low Risk (04/15/2024)  Transportation Needs: No Transportation Needs (04/15/2024)  Utilities: Not At Risk (04/15/2024)  Alcohol Screen: Low Risk (10/29/2023)  Depression (PHQ2-9): Low Risk (10/29/2023)  Financial Resource Strain: Low Risk (10/29/2023)  Physical Activity: Inactive (10/29/2023)  Social Connections: Moderately Isolated (04/15/2024)  Stress: No Stress Concern Present (10/29/2023)  Tobacco Use: Medium Risk (04/13/2024)  Health Literacy: Adequate Health Literacy (10/29/2023)    Readmission Risk Interventions     No data to display

## 2024-04-17 NOTE — Progress Notes (Signed)
 Attempted to call SNF twice to give report. No answer either time.

## 2024-04-17 NOTE — Discharge Summary (Signed)
 Physician Discharge Summary      Patient ID: Sharon Knight MRN: [id number] DOB/AGE: [birth year] 78 y.o.  Admit date: 04/13/2024 Discharge date: 04/17/24  Admission Diagnoses:  Principal Problem:   S/P hip replacement, right   Discharge Diagnoses:  Same  Surgeries: Procedures: RIGHT TOTAL HIP ARTHROPLASTY, ANTERIOR APPROACH on 04/13/2024   Consultants:   Discharged Condition: Stable  Hospital Course: Sharon Knight is an 78 y.o. 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  They were brought to the operating room on 04/13/2024 and underwent the above named procedures.  Pt awoke from anesthesia without complication and was transferred to the floor. On POD1, patient's pain was overall controlled.  She was able to mobilize with PT to a small extent but due to significant limited mobility, needed to arrange discharge to skilled nursing facility.  She was discharged to SNF without any red flag signs or symptoms throughout her stay.  Pt will f/u with Dr. Addie in clinic in ~2 weeks.   Antibiotics given:  Anti-infectives (From admission, onward)    Start     Dose/Rate Route Frequency Ordered Stop   04/14/24 1600  ceFAZolin  (ANCEF ) IVPB 2g/100 mL premix        2 g 200 mL/hr over 30 Minutes Intravenous Every 8 hours 04/14/24 1439 04/14/24 2202   04/13/24 0840  vancomycin  (VANCOCIN ) powder  Status:  Discontinued          As needed 04/13/24 0840 04/13/24 1035   04/13/24 0615  ceFAZolin  (ANCEF ) IVPB 2g/100 mL premix        2 g 200 mL/hr over 30 Minutes Intravenous On call to O.R. 04/13/24 0602 04/13/24 0850     .  Recent vital signs:  Vitals:   04/17/24 0502 04/17/24 0722  BP: (!) 133/54 (!) 135/56  Pulse: 64 72  Resp: 16 18  Temp: 98.1 F (36.7 C) 98.9 F (37.2 C)  SpO2: 95% 97%    Recent laboratory studies:  Results for orders placed or performed during the hospital encounter of  04/13/24  Glucose, capillary   Collection Time: 04/13/24  6:09 AM  Result Value Ref Range   Glucose-Capillary 151 (H) 70 - 99 mg/dL  Glucose, capillary   Collection Time: 04/13/24 10:34 AM  Result Value Ref Range   Glucose-Capillary 133 (H) 70 - 99 mg/dL  Basic metabolic panel   Collection Time: 04/14/24  4:37 AM  Result Value Ref Range   Sodium 135 135 - 145 mmol/L   Potassium 3.0 (L) 3.5 - 5.1 mmol/L   Chloride 100 98 - 111 mmol/L   CO2 25 22 - 32 mmol/L   Glucose, Bld 174 (H) 70 - 99 mg/dL   BUN 16 8 - 23 mg/dL   Creatinine, Ser 9.01 0.44 - 1.00 mg/dL   Calcium  8.4 (L) 8.9 - 10.3 mg/dL   GFR, Estimated 59 (L) >60 mL/min   Anion gap 10 5 - 15    Discharge Medications:   Allergies as of 04/17/2024       Reactions   Ibuprofen Other (See Comments)   Dizziness made me woozy        Medication List     TAKE these medications    acetaminophen  500 MG tablet Commonly known as: TYLENOL  Take 500 mg by mouth 2 (two) times daily.   atorvastatin  10 MG tablet Commonly known as: LIPITOR Take 1 tablet (10 mg total)  by mouth daily.   B-12 PO Take 1 tablet by mouth every other day.   D3 25 MCG (1000 UT) capsule Generic drug: Cholecalciferol Take 1,000 Units by mouth every other day.   diltiazem  180 MG 24 hr capsule Commonly known as: Cartia  XT Take 1 capsule (180 mg total) by mouth daily.   Eliquis  5 MG Tabs tablet Generic drug: apixaban  TAKE ONE TABLET BY MOUTH TWICE DAILY   flecainide  100 MG tablet Commonly known as: TAMBOCOR  Take 1 tablet (100 mg total) by mouth 2 (two) times daily. Please call office to schedule an appt for further refills. Thank you   hydrochlorothiazide  25 MG tablet Commonly known as: HYDRODIURIL  TAKE ONE TABLET BY MOUTH ONCE DAILY   magnesium 30 MG tablet Take 30 mg by mouth 2 (two) times daily.   methocarbamol  500 MG tablet Commonly known as: ROBAXIN  Take 1 tablet (500 mg total) by mouth every 8 (eight) hours as needed for muscle  spasms.   olmesartan  5 MG tablet Commonly known as: BENICAR  Take 1 tablet (5 mg total) by mouth daily.   oxyCODONE  5 MG immediate release tablet Commonly known as: Oxy IR/ROXICODONE  Take 1 tablet (5 mg total) by mouth every 4 (four) hours as needed for moderate pain (pain score 4-6) (pain score 4-6).   STOOL SOFTENER LAXATIVE PO Take 3-6 capsules by mouth daily.   TART CHERRY ADVANCED PO Take 2 capsules by mouth 2 (two) times daily.        Diagnostic Studies: DG Pelvis Portable Result Date: 04/13/2024 CLINICAL DATA:  Status post hip replacement EXAM: PORTABLE PELVIS 1-2 VIEWS COMPARISON:  07/21/2023 FINDINGS: Interval right hip replacement with intact hardware and normal alignment. No fracture. Gas in the soft tissues consistent with recent surgery IMPRESSION: Status post right hip replacement with expected postsurgical change. Electronically Signed   By: Luke Bun M.D.   On: 04/13/2024 17:52   DG HIP UNILAT WITH PELVIS 1V RIGHT Result Date: 04/13/2024 CLINICAL DATA:  Elective surgery EXAM: DG HIP (WITH OR WITHOUT PELVIS) 1V RIGHT COMPARISON:  07/21/2023 FINDINGS: Multiple low resolution intraoperative spot views of the right hip. Total fluoroscopy time was 31.6 seconds, fluoroscopic dose of 3.1 mGy. Images were obtained during operative right hip replacement. IMPRESSION: Intraoperative fluoroscopic assistance provided during right hip replacement. Electronically Signed   By: Luke Bun M.D.   On: 04/13/2024 17:51   DG C-Arm 1-60 Min-No Report Result Date: 04/13/2024 Fluoroscopy was utilized by the requesting physician.  No radiographic interpretation.   DG C-Arm 1-60 Min-No Report Result Date: 04/13/2024 Fluoroscopy was utilized by the requesting physician.  No radiographic interpretation.   MM 3D SCREENING MAMMOGRAM BILATERAL BREAST Result Date: 03/28/2024 CLINICAL DATA:  Screening. EXAM: DIGITAL SCREENING BILATERAL MAMMOGRAM WITH TOMOSYNTHESIS AND CAD TECHNIQUE:  Bilateral screening digital craniocaudal and mediolateral oblique mammograms were obtained. Bilateral screening digital breast tomosynthesis was performed. The images were evaluated with computer-aided detection. COMPARISON:  Previous exam(s). ACR Breast Density Category b: There are scattered areas of fibroglandular density. FINDINGS: There are no findings suspicious for malignancy. IMPRESSION: No mammographic evidence of malignancy. A result letter of this screening mammogram will be mailed directly to the patient. RECOMMENDATION: Screening mammogram in one year. (Code:[id number]-B-01Y) BI-RADS CATEGORY  1: Negative. Electronically Signed   By: Debby Satterfield M.D.   On: 03/28/2024 10:38    Disposition: Discharge disposition: 03-Skilled Nursing Facility       Discharge Instructions     Call MD / Call 911   Complete by: As  directed    If you experience chest pain or shortness of breath, CALL 911 and be transported to the hospital emergency room.  If you develope a fever above 101 F, pus (white drainage) or increased drainage or redness at the wound, or calf pain, call your surgeon's office.   Constipation Prevention   Complete by: As directed    Drink plenty of fluids.  Prune juice may be helpful.  You may use a stool softener, such as Colace (over the counter) 100 mg twice a day.  Use MiraLax (over the counter) for constipation as needed.   Discharge instructions   Complete by: As directed    You may shower, dressing is waterproof.  Do not remove the dressing, we will remove it at your first post-op appointment.  Do not take a bath or soak the hip in a tub or pool.  You may weightbear as you can tolerate on the operative leg with a walker.  You will follow-up with Dr. Addie in the clinic in 2 weeks at your given appointment date.    INSTRUCTIONS AFTER JOINT REPLACEMENT   Remove items at home which could result in a fall. This includes throw rugs or furniture in walking pathways ICE to the affected  joint every three hours while awake for 30 minutes at a time, for at least the first 3-5 days, and then as needed for pain and swelling.  Continue to use ice for pain and swelling. You may notice swelling that will progress down to the foot and ankle.  This is normal after surgery.  Elevate your leg when you are not up walking on it.   Continue to use the breathing machine you got in the hospital (incentive spirometer) which will help keep your temperature down.  It is common for your temperature to cycle up and down following surgery, especially at night when you are not up moving around and exerting yourself.  The breathing machine keeps your lungs expanded and your temperature down.   DIET:  As you were doing prior to hospitalization, we recommend a well-balanced diet.  DRESSING / WOUND CARE / SHOWERING  Keep the surgical dressing until follow up.  The dressing is water  proof, so you can shower without any extra covering.  IF THE DRESSING FALLS OFF or the wound gets wet inside, change the dressing with sterile gauze.  Please use good hand washing techniques before changing the dressing.  Do not use any lotions or creams on the incision until instructed by your surgeon.    ACTIVITY  Increase activity slowly as tolerated, but follow the weight bearing instructions below.   No driving for 6 weeks or until further direction given by your physician.  You cannot drive while taking narcotics.  No lifting or carrying greater than 10 lbs. until further directed by your surgeon. Avoid periods of inactivity such as sitting longer than an hour when not asleep. This helps prevent blood clots.  You may return to work once you are authorized by your doctor.     WEIGHT BEARING   Weight bearing as tolerated with assist device (walker, cane, etc) as directed, use it as long as suggested by your surgeon or therapist, typically at least 4-6 weeks.   EXERCISES  Results after joint replacement surgery are  often greatly improved when you follow the exercise, range of motion and muscle strengthening exercises prescribed by your doctor. Safety measures are also important to protect the joint from further injury. Any time  any of these exercises cause you to have increased pain or swelling, decrease what you are doing until you are comfortable again and then slowly increase them. If you have problems or questions, call your caregiver or physical therapist for advice.   Rehabilitation is important following a joint replacement. After just a few days of immobilization, the muscles of the leg can become weakened and shrink (atrophy).  These exercises are designed to build up the tone and strength of the thigh and leg muscles and to improve motion. Often times heat used for twenty to thirty minutes before working out will loosen up your tissues and help with improving the range of motion but do not use heat for the first two weeks following surgery (sometimes heat can increase post-operative swelling).   These exercises can be done on a training (exercise) mat, on the floor, on a table or on a bed. Use whatever works the best and is most comfortable for you.    Use music or television while you are exercising so that the exercises are a pleasant break in your day. This will make your life better with the exercises acting as a break in your routine that you can look forward to.   Perform all exercises about fifteen times, three times per day or as directed.  You should exercise both the operative leg and the other leg as well.  Exercises include:   Quad Sets - Tighten up the muscle on the front of the thigh (Quad) and hold for 5-10 seconds.   Straight Leg Raises - With your knee straight (if you were given a brace, keep it on), lift the leg to 60 degrees, hold for 3 seconds, and slowly lower the leg.  Perform this exercise against resistance later as your leg gets stronger.  Leg Slides: Lying on your back, slowly  slide your foot toward your buttocks, bending your knee up off the floor (only go as far as is comfortable). Then slowly slide your foot back down until your leg is flat on the floor again.  Angel Wings: Lying on your back spread your legs to the side as far apart as you can without causing discomfort.  Hamstring Strength:  Lying on your back, push your heel against the floor with your leg straight by tightening up the muscles of your buttocks.  Repeat, but this time bend your knee to a comfortable angle, and push your heel against the floor.  You may put a pillow under the heel to make it more comfortable if necessary.   A rehabilitation program following joint replacement surgery can speed recovery and prevent re-injury in the future due to weakened muscles. Contact your doctor or a physical therapist for more information on knee rehabilitation.    CONSTIPATION  Constipation is defined medically as fewer than three stools per week and severe constipation as less than one stool per week.  Even if you have a regular bowel pattern at home, your normal regimen is likely to be disrupted due to multiple reasons following surgery.  Combination of anesthesia, postoperative narcotics, change in appetite and fluid intake all can affect your bowels.   YOU MUST use at least one of the following options; they are listed in order of increasing strength to get the job done.  They are all available over the counter, and you may need to use some, POSSIBLY even all of these options:    Drink plenty of fluids (prune juice may be helpful) and high  fiber foods Colace 100 mg by mouth twice a day  Senokot for constipation as directed and as needed Dulcolax (bisacodyl), take with full glass of water   Miralax (polyethylene glycol) once or twice a day as needed.  If you have tried all these things and are unable to have a bowel movement in the first 3-4 days after surgery call either your surgeon or your primary doctor.     If you experience loose stools or diarrhea, hold the medications until you stool forms back up.  If your symptoms do not get better within 1 week or if they get worse, check with your doctor.  If you experience the worst abdominal pain ever or develop nausea or vomiting, please contact the office immediately for further recommendations for treatment.   ITCHING:  If you experience itching with your medications, try taking only a single pain pill, or even half a pain pill at a time.  You can also use Benadryl over the counter for itching or also to help with sleep.   TED HOSE STOCKINGS:  Use stockings on both legs until for at least 2 weeks or as directed by physician office. They may be removed at night for sleeping.  MEDICATIONS:  See your medication summary on the After Visit Summary that nursing will review with you.  You may have some home medications which will be placed on hold until you complete the course of blood thinner medication.  It is important for you to complete the blood thinner medication as prescribed.  PRECAUTIONS:  If you experience chest pain or shortness of breath - call 911 immediately for transfer to the hospital emergency department.   If you develop a fever greater that 101 F, purulent drainage from wound, increased redness or drainage from wound, foul odor from the wound/dressing, or calf pain - CONTACT YOUR SURGEON.                                                   FOLLOW-UP APPOINTMENTS:  If you do not already have a post-op appointment, please call the office for an appointment to be seen by your surgeon.  Guidelines for how soon to be seen are listed in your After Visit Summary, but are typically between 1-4 weeks after surgery.  OTHER INSTRUCTIONS:   POST-OPERATIVE OPIOID TAPER INSTRUCTIONS: It is important to wean off of your opioid medication as soon as possible. If you do not need pain medication after your surgery it is ok to stop day one. Opioids  include: Codeine, Hydrocodone(Norco, Vicodin), Oxycodone (Percocet, oxycontin ) and hydromorphone  amongst others.  Long term and even short term use of opiods can cause: Increased pain response Dependence Constipation Depression Respiratory depression And more.  Withdrawal symptoms can include Flu like symptoms Nausea, vomiting And more Techniques to manage these symptoms Hydrate well Eat regular healthy meals Stay active Use relaxation techniques(deep breathing, meditating, yoga) Do Not substitute Alcohol to help with tapering If you have been on opioids for less than two weeks and do not have pain than it is ok to stop all together.  Plan to wean off of opioids This plan should start within one week post op of your joint replacement. Maintain the same interval or time between taking each dose and first decrease the dose.  Cut the total daily intake of opioids by one tablet each  day Next start to increase the time between doses. The last dose that should be eliminated is the evening dose.   MAKE SURE YOU:  Understand these instructions.  Get help right away if you are not doing well or get worse.    Thank you for letting us  be a part of your medical care team.  It is a privilege we respect greatly.  We hope these instructions will help you stay on track for a fast and full recovery!    Dental Antibiotics:  In most cases prophylactic antibiotics for Dental procdeures after total joint surgery are not necessary.  Exceptions are as follows:  1. History of prior total joint infection  2. Severely immunocompromised (Organ Transplant, cancer chemotherapy, Rheumatoid biologic meds such as Humera)  3. Poorly controlled diabetes (A1C &gt; 8.0, blood glucose over 200)  If you have one of these conditions, contact your surgeon for an antibiotic prescription, prior to your dental procedure.   Increase activity slowly as tolerated   Complete by: As directed    Post-operative  opioid taper instructions:   Complete by: As directed    POST-OPERATIVE OPIOID TAPER INSTRUCTIONS: It is important to wean off of your opioid medication as soon as possible. If you do not need pain medication after your surgery it is ok to stop day one. Opioids include: Codeine, Hydrocodone(Norco, Vicodin), Oxycodone (Percocet, oxycontin ) and hydromorphone  amongst others.  Long term and even short term use of opiods can cause: Increased pain response Dependence Constipation Depression Respiratory depression And more.  Withdrawal symptoms can include Flu like symptoms Nausea, vomiting And more Techniques to manage these symptoms Hydrate well Eat regular healthy meals Stay active Use relaxation techniques(deep breathing, meditating, yoga) Do Not substitute Alcohol to help with tapering If you have been on opioids for less than two weeks and do not have pain than it is ok to stop all together.  Plan to wean off of opioids This plan should start within one week post op of your joint replacement. Maintain the same interval or time between taking each dose and first decrease the dose.  Cut the total daily intake of opioids by one tablet each day Next start to increase the time between doses. The last dose that should be eliminated is the evening dose.           Contact information for after-discharge care     Destination     [facility] .   Service: Skilled Nursing Contact information: [phone] [place]   [phone]                      Signed: Herlene Calix 04/17/2024, 12:55 PM

## 2024-04-17 NOTE — TOC Transition Note (Signed)
 Transition of Care [facility]) - Discharge Note   Patient Details  Name: Sharon Knight MRN: [id number] Date of Birth: April 02, 1946  [facility] CM/SW Contact:  Bridget Cordella Simmonds, LCSW Phone Number: 04/17/2024, 1:50 PM   Clinical Narrative:   Pt discharging to [facility], room 114. RN call report to [phone].   PTAR called [phone].  Final next level of care: Skilled Nursing Facility Barriers to Discharge: Barriers Resolved   Patient Goals and CMS Choice   CMS Medicare.gov Compare Post Acute Care list provided to:: Patient Choice offered to / list presented to : Patient [place] ownership interest in [facility].provided to:: Patient    Discharge Placement              Patient chooses bed at: [facility] Patient to be transferred to facility by: ptar Name of family member notified: Iva-friend Patient and family notified of of transfer: 04/17/24  Discharge Plan and Services Additional resources added to the After Visit Summary for       Post Acute Care Choice: Skilled Nursing Facility                               Social Drivers of Health (SDOH) Interventions SDOH Screenings   Food Insecurity: No Food Insecurity (04/15/2024)  Housing: Low Risk (04/15/2024)  Transportation Needs: No Transportation Needs (04/15/2024)  Utilities: Not At Risk (04/15/2024)  Alcohol Screen: Low Risk (10/29/2023)  Depression (PHQ2-9): Low Risk (10/29/2023)  Financial Resource Strain: Low Risk (10/29/2023)  Physical Activity: Inactive (10/29/2023)  Social Connections: Moderately Isolated (04/15/2024)  Stress: No Stress Concern Present (10/29/2023)  Tobacco Use: Medium Risk (04/13/2024)  Health Literacy: Adequate Health Literacy (10/29/2023)     Readmission Risk Interventions     No data to display

## 2024-04-17 NOTE — Plan of Care (Signed)

## 2024-04-17 NOTE — Progress Notes (Signed)
 Patient discharged to SNF. Iva called to notify of patient leaving per request.

## 2024-04-17 NOTE — Progress Notes (Signed)
 Physical Therapy Treatment Patient Details Name: Sharon Knight MRN: [id number] DOB: 09-07-1945 Today's Date: 04/17/2024   History of Present Illness Pt is 78 yo presenting to [facility] for scheduled R THA. PMH: sepsis, a-fib with RVR, HTN.    PT Comments  Pt mobility significantly limited today by pain, weakness, and shaking/ partial knee buckling in standing. Pt required 2 person assist for all aspects of mobility. Transferred to [facility] with mod A +2 and then to recliner but could not progress ambulation due to knees buckling and pt shaking all over. Pt's friend relays that this is new. Pt tolerated seated and supine there ex. Patient will benefit from continued inpatient follow up therapy, <3 hours/day. PT will continue to follow.     If plan is discharge home, recommend the following: A lot of help with walking and/or transfers;A lot of help with bathing/dressing/bathroom;Assistance with cooking/housework;Assist for transportation;Help with stairs or ramp for entrance;Direct supervision/assist for medications management;Direct supervision/assist for financial management;Supervision due to cognitive status   Can travel by private vehicle     No  Equipment Recommendations  BSC/3in1    Recommendations for Other Services       Precautions / Restrictions Precautions Precautions: Fall Recall of Precautions/Restrictions: Intact Precaution/Restrictions Comments: direct anterior THA, no precautions Restrictions Weight Bearing Restrictions Per Provider Order: Yes RLE Weight Bearing Per Provider Order: Weight bearing as tolerated     Mobility  Bed Mobility Overal bed mobility: Needs Assistance Bed Mobility: Supine to Sit     Supine to sit: Mod assist, +2 for physical assistance     General bed mobility comments: increased time and effort to bring LE's towards EOB with max A needed to guide R LE and max vc's for her to manage LLE.  Mod A to also raise trunk.    Transfers Overall transfer  level: Needs assistance Equipment used: Rolling walker (2 wheels) Transfers: Sit to/from Stand, Bed to chair/wheelchair/BSC Sit to Stand: Mod assist, +2 physical assistance   Step pivot transfers: Mod assist, +2 physical assistance       General transfer comment: pt shaking all over with each STS. Mod A +2 needed for power up and throughout step pivot due to shaking and B knees being buckly. Pt pivot stepped bed to [facility] and then BSC to recliner. Pt not safe to ambulate further due to extreme fatigue and more shaking after 2 transfers    Ambulation/Gait               General Gait Details: could not advance gait today due to weakness/ shaking   Stairs             Wheelchair Mobility     Tilt Bed    Modified Rankin (Stroke Patients Only)       Balance Overall balance assessment: Needs assistance Sitting-balance support: No upper extremity supported, Feet supported Sitting balance-Leahy Scale: Fair   Postural control: Posterior lean Standing balance support: Bilateral upper extremity supported, Reliant on assistive device for balance Standing balance-Leahy Scale: Poor Standing balance comment: Mod A to maintain standing with RW                            Communication Communication Communication: Impaired Factors Affecting Communication: Hearing impaired  Cognition Arousal: Alert Behavior During Therapy: WFL for tasks assessed/performed   PT - Cognitive impairments: Awareness, Memory, Sequencing, Problem solving, Safety/Judgement  PT - Cognition Comments: Required frequent cueing and repetition for commands. Increased difficulty sequencing mobility this date Following commands: Impaired Following commands impaired: Follows one step commands with increased time, Follows multi-step commands inconsistently    Cueing Cueing Techniques: Verbal cues, Tactile cues, Visual cues  Exercises Total Joint Exercises Ankle  Circles/Pumps: AROM, Both, 10 reps, Supine Quad Sets: AROM, 10 reps, Supine, Right Heel Slides: AAROM, Right, 10 reps, Supine Hip ABduction/ADduction: AAROM, Right, 10 reps, Supine Straight Leg Raises: AAROM, Right, 10 reps, Supine Long Arc Quad: AROM, Right, 10 reps, Seated    General Comments General comments (skin integrity, edema, etc.): VSS      Pertinent Vitals/Pain Pain Assessment Pain Assessment: Faces Faces Pain Scale: Hurts whole lot Pain Location: R hip at incision site Pain Descriptors / Indicators: Sore Pain Intervention(s): Limited activity within patient's tolerance, Monitored during session    Home Living                          Prior Function            PT Goals (current goals can now be found in the care plan section) Acute Rehab PT Goals Patient Stated Goal: to improve mobility prior to returning home. PT Goal Formulation: With patient Time For Goal Achievement: 04/27/24 Potential to Achieve Goals: Good Progress towards PT goals: Progressing toward goals    Frequency    7X/week      PT Plan      Co-evaluation              AM-PAC PT 6 Clicks Mobility   Outcome Measure  Help needed turning from your back to your side while in a flat bed without using bedrails?: A Lot Help needed moving from lying on your back to sitting on the side of a flat bed without using bedrails?: A Lot Help needed moving to and from a bed to a chair (including a wheelchair)?: Total Help needed standing up from a chair using your arms (e.g., wheelchair or bedside chair)?: Total Help needed to walk in hospital room?: Total Help needed climbing 3-5 steps with a railing? : Total 6 Click Score: 8    End of Session Equipment Utilized During Treatment: Gait belt Activity Tolerance: Patient limited by pain;Patient limited by fatigue Patient left: with call bell/phone within reach;in chair;with chair alarm set;with family/visitor present Nurse  Communication: Mobility status;Need for lift equipment (stedy) PT Visit Diagnosis: Unsteadiness on feet (R26.81);Other abnormalities of gait and mobility (R26.89);Muscle weakness (generalized) (M62.81);Pain Pain - Right/Left: Right Pain - part of body: Hip     Time: 0925-0958 PT Time Calculation (min) (ACUTE ONLY): 33 min  Charges:    $Therapeutic Exercise: 8-22 mins $Therapeutic Activity: 8-22 mins PT General Charges $$ ACUTE PT VISIT: 1 Visit                     Richerd Lipoma, PT  Acute Rehab Services Secure chat preferred Office [phone]    Richerd CROME Lacye Mccarn 04/17/2024, 10:56 AM

## 2024-05-01 ENCOUNTER — Other Ambulatory Visit (HOSPITAL_COMMUNITY): Payer: Self-pay

## 2024-05-01 ENCOUNTER — Ambulatory Visit (INDEPENDENT_AMBULATORY_CARE_PROVIDER_SITE_OTHER): Admitting: Surgical

## 2024-05-01 ENCOUNTER — Encounter: Payer: Self-pay | Admitting: Surgical

## 2024-05-01 ENCOUNTER — Other Ambulatory Visit: Payer: Self-pay

## 2024-05-01 DIAGNOSIS — Z96649 Presence of unspecified artificial hip joint: Secondary | ICD-10-CM | POA: Diagnosis not present

## 2024-05-01 MED FILL — Apixaban Tab 5 MG: ORAL | 30 days supply | Qty: 60 | Fill #1 | Status: AC

## 2024-05-01 NOTE — Progress Notes (Signed)
 "  Post-Op Visit Note   Patient: Sharon Knight           Date of Birth: [birth year]           MRN: [id number] Visit Date: 05/01/2024 PCP: Geofm Glade PARAS, MD   Assessment & Plan:  Chief Complaint:  Chief Complaint  Patient presents with   Right Hip - Routine Post Op    04/13/24 Right THA   Visit Diagnoses:  1. Status post total replacement of hip, unspecified laterality     Plan: Patient is a 78 year old female who presents s/p right total hip arthroplasty on 04/13/2024.  She is progressively improving.  She describes pain around the trochanter that radiates down to the knee at times.  Pain is getting better and better.  She was staying at skilled nursing facility but has gone home as of 12/24.  She has had home health physical therapy assessment but no sessions yet.  Had to discontinue oxycodone  and Robaxin  due to side effects.  She is now taking just Tylenol  for pain control.  She denies any chest pain, shortness of breath, calf pain.  Ambulating with walker.  On exam, patient has incision that is healing well without evidence of infection or dehiscence.  No erythema.  No fluctuance concerning for seroma or abscess.  No calf tenderness.  Negative Homans' sign.  Palpable DP pulse of the right lower extremity.  Intact ankle dorsiflexion and plantarflexion.  Intact hip flexion with strength rated 5 -/5.  Minimal pain with passive hip flexion but she does have a little bit of pain with passive internal rotation.  Plan at this time is to continue with home health physical therapy.  Seems that she is having a lot less symptoms than last time I saw her in the hospital.  Has pretty good hip flexion strength on exam which is reassuring.  Suspect that she will continue to improve slowly over the next month and we can see her back in 4 weeks for clinical recheck with Dr. Addie.  Call with any concerns in the meantime.  Follow-Up Instructions: No follow-ups on file.   Orders:  Orders Placed This  Encounter  Procedures   XR HIP UNILAT W OR W/O PELVIS 2-3 VIEWS RIGHT   No orders of the defined types were placed in this encounter.   Imaging: No results found.  PMFS History: Patient Active Problem List   Diagnosis Date Noted   S/P hip replacement, right 04/13/2024   Arthritis of right hip 08/04/2023   Greater trochanteric bursitis, right 08/04/2023   RLS (restless legs syndrome) 06/09/2023   Sleep difficulties 06/09/2023   Low energy 06/09/2023   Type 2 diabetes mellitus with stage 3b chronic kidney disease, without long-term current use of insulin (HCC) 12/01/2022   Vertigo 10/23/2021   Exertional dyspnea 12/19/2020   Aortic atherosclerosis 10/16/2020   Acute pain of left knee 09/16/2020   Trigger finger, right ring finger 03/17/2019   CKD stage 3, 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 03/16/2019   Bilateral low back pain 03/17/2018   History of gout 12/21/2017   Lobar pneumonia 12/10/2017   Paroxysmal atrial fibrillation (HCC) 12/10/2017   History of colonic polyps 10/22/2014   History of nephrectomy, unilateral 11/13/2012   Vitamin D  deficiency 10/31/2012   ECZEMA 03/03/2010   Benign neoplasm of colon 08/02/2007   Hyperlipidemia 08/02/2007   Essential hypertension 08/02/2007   Osteopenia 08/02/2007   Past Medical History:  Diagnosis Date   Arthritis  Atrial fibrillation (HCC)    Bilateral cataracts    Dr Rosan   Cancer [facility])    skin cancer on face- removed   Chronic kidney disease    Diabetes mellitus without complication (HCC)    Exertional dyspnea 12/19/2020   Hyperlipidemia    Hypertension    Pneumonia     Family History  Adopted: Yes  Problem Relation Age of Onset   Colon cancer Neg Hx    Stomach cancer Neg Hx    Breast cancer Neg Hx     Past Surgical History:  Procedure Laterality Date   CATARACT EXTRACTION W/ INTRAOCULAR LENS IMPLANT     COLONOSCOPY     Dr Aneita   NEPHRECTOMY     right; for cyst on kidney   OVARIAN CYST  REMOVAL     TOTAL HIP ARTHROPLASTY Right 04/13/2024   Procedure: RIGHT TOTAL HIP ARTHROPLASTY, ANTERIOR APPROACH;  Surgeon: Addie Cordella Hamilton, MD;  Location: [facility] OR;  Service: Orthopedics;  Laterality: Right;   Social History   Occupational History   Not on file  Tobacco Use   Smoking status: Former    Current packs/day: 0.00    Types: Cigarettes    Quit date: 08/27/1984    Years since quitting: 39.7   Smokeless tobacco: Never   Tobacco comments:    smoked age [age], up to 1.5-2 ppd  Vaping Use   Vaping status: Never Used  Substance and Sexual Activity   Alcohol use: Not Currently   Drug use: No   Sexual activity: Not Currently     "

## 2024-05-16 ENCOUNTER — Encounter: Payer: Self-pay | Admitting: Internal Medicine

## 2024-05-16 NOTE — Progress Notes (Unsigned)
" ° ° °  Subjective:    Patient ID: Sharon Knight, female    DOB: Aug 03, 1945, 79 y.o.   MRN: [id number]      HPI [name] is here for No chief complaint on file.   04/13/2024: Right total hip arthroplasty     Medications and allergies reviewed with patient and updated if appropriate.  Medications Ordered Prior to Encounter[1]  Review of Systems     Objective:  There were no vitals filed for this visit. BP Readings from Last 3 Encounters:  04/17/24 (!) 135/56  04/10/24 (!) 137/56  01/04/24 112/74   Wt Readings from Last 3 Encounters:  04/13/24 160 lb (72.6 kg)  04/10/24 160 lb 6.4 oz (72.8 kg)  12/07/23 157 lb (71.2 kg)   There is no height or weight on file to calculate BMI.    Physical Exam         Assessment & Plan:    See Problem List for Assessment and Plan of chronic medical problems.         [1]  Current Outpatient Medications on File Prior to Visit  Medication Sig Dispense Refill   acetaminophen  (TYLENOL ) 500 MG tablet Take 500 mg by mouth 2 (two) times daily.     apixaban  (ELIQUIS ) 5 MG TABS tablet TAKE ONE TABLET BY MOUTH TWICE DAILY 60 tablet 5   atorvastatin  (LIPITOR) 10 MG tablet Take 1 tablet (10 mg total) by mouth daily. 90 tablet 3   Cholecalciferol (D3) 25 MCG (1000 UT) capsule Take 1,000 Units by mouth every other day.     Cyanocobalamin (B-12 PO) Take 1 tablet by mouth every other day.     diltiazem  (CARTIA  XT) 180 MG 24 hr capsule Take 1 capsule (180 mg total) by mouth daily. 90 capsule 3   Docusate Sodium  (STOOL SOFTENER LAXATIVE PO) Take 3-6 capsules by mouth daily.     flecainide  (TAMBOCOR ) 100 MG tablet Take 1 tablet (100 mg total) by mouth 2 (two) times daily. Please call office to schedule an appt for further refills. Thank you 180 tablet 1   hydrochlorothiazide  (HYDRODIURIL ) 25 MG tablet TAKE ONE TABLET BY MOUTH ONCE DAILY 90 tablet 3   magnesium 30 MG tablet Take 30 mg by mouth 2 (two) times daily.     methocarbamol  (ROBAXIN )  500 MG tablet Take 1 tablet (500 mg total) by mouth every 8 (eight) hours as needed for muscle spasms. 30 tablet 0   Misc Natural Products (TART CHERRY ADVANCED PO) Take 2 capsules by mouth 2 (two) times daily.     olmesartan  (BENICAR ) 5 MG tablet Take 1 tablet (5 mg total) by mouth daily. 90 tablet 3   oxyCODONE  (OXY IR/ROXICODONE ) 5 MG immediate release tablet Take 1 tablet (5 mg total) by mouth every 4 (four) hours as needed for moderate pain (pain score 4-6) (pain score 4-6). 30 tablet 0   No current facility-administered medications on file prior to visit.   "

## 2024-05-17 ENCOUNTER — Ambulatory Visit: Admitting: Internal Medicine

## 2024-05-17 ENCOUNTER — Telehealth: Payer: Self-pay | Admitting: Orthopedic Surgery

## 2024-05-17 VITALS — BP 120/62 | HR 66 | Temp 98.3°F | Ht 63.0 in | Wt 155.0 lb

## 2024-05-17 DIAGNOSIS — G479 Sleep disorder, unspecified: Secondary | ICD-10-CM | POA: Diagnosis not present

## 2024-05-17 DIAGNOSIS — H938X3 Other specified disorders of ear, bilateral: Secondary | ICD-10-CM | POA: Diagnosis not present

## 2024-05-17 DIAGNOSIS — H6123 Impacted cerumen, bilateral: Secondary | ICD-10-CM

## 2024-05-17 MED ORDER — TRAZODONE HCL 50 MG PO TABS: 25.0000 mg | ORAL_TABLET | Freq: Every evening | ORAL | 2 refills | Status: AC | PRN

## 2024-05-17 NOTE — Progress Notes (Signed)
 PRE-PROCEDURE EXAM: Bilateral TM's cannot be visualized due to total occlusion/impaction of the ear canal.  PROCEDURE INDICATION: remove wax to visualize ear drum & relieve discomfort  CONSENT:  Verbal    PROCEDURE NOTE:   RIGHT EAR:  The CMA irrigated the ear canal with warm water to remove the wax.   LEFT EAR:  The CMA irrigated the ear canal with warm water to remove the wax.   POST- PROCEDURE EXAM: B/l TMs successfully visualized and found to have no erythema.  Right ear canal with scant remaining cerumen.  Left ear canal with no remaining cerumen.    Patient tolerated the procedure well and had relief of their symptoms after the procedure.

## 2024-05-17 NOTE — Assessment & Plan Note (Signed)
 Acute Notes fullness in both the ears and is noted that if she tries to clean them out there is a lot of wax so she is concerned about excessive wax She has chronic hearing loss but it is slightly worse in the right ear. Excessive cerumen bilaterally-ear lavage performed today

## 2024-05-17 NOTE — Telephone Encounter (Signed)
IC verbal given.  

## 2024-05-17 NOTE — Assessment & Plan Note (Addendum)
 Acute Has been experiencing worsening sleep difficulties since surgery last month-right total hip arthroplasty Difficulty staying asleep and sometimes falling asleep Start trazodone  25-50 mg at bedtime as needed Discussed that hopefully this is a temporary medication and if she further recovers from surgery it should not be needed She is experiencing some restless leg symptoms which is likely contributing to her difficulties falling asleep-advised taking an over-the-counter slow release iron Follow-up if no improvement

## 2024-05-17 NOTE — Patient Instructions (Addendum)
" ° °  Your ears were cleaned out.     Medications changes include :   you can try a slow release iron pill daily to see if that helps with the RLS.  Trazodone  25-50 mg at night as needed - start with 25 mg for a night or two and if that does not work increase to 50 mg nightly.  Take 30 minutes prior to going to bed.        Return for follow up as scheduled.  "

## 2024-05-17 NOTE — Telephone Encounter (Signed)
 Jerel (PT) from [facility] called requesting continued PT for pt due to pt has no transportation. Extended for 2wk 4. Jerel secure number is [phone].

## 2024-05-17 NOTE — Assessment & Plan Note (Signed)
 Acute Likely the cause of her ear fullness and worsening of her hearing loss Ear lavage today done-see procedure note

## 2024-05-29 ENCOUNTER — Encounter: Admitting: Surgical

## 2024-06-01 ENCOUNTER — Other Ambulatory Visit: Payer: Self-pay

## 2024-06-01 ENCOUNTER — Other Ambulatory Visit (HOSPITAL_COMMUNITY): Payer: Self-pay

## 2024-06-01 MED FILL — Apixaban Tab 5 MG: ORAL | 30 days supply | Qty: 60 | Fill #2 | Status: AC

## 2024-06-02 ENCOUNTER — Other Ambulatory Visit: Payer: Self-pay

## 2024-06-02 ENCOUNTER — Other Ambulatory Visit (HOSPITAL_COMMUNITY): Payer: Self-pay

## 2024-06-04 ENCOUNTER — Other Ambulatory Visit (HOSPITAL_COMMUNITY): Payer: Self-pay

## 2024-06-05 ENCOUNTER — Encounter: Admitting: Surgical

## 2024-06-12 ENCOUNTER — Encounter: Admitting: Surgical

## 2024-06-14 ENCOUNTER — Encounter: Admitting: Internal Medicine

## 2024-06-20 ENCOUNTER — Encounter: Admitting: Internal Medicine

## 2024-10-30 ENCOUNTER — Ambulatory Visit
